# Patient Record
Sex: Male | Born: 1960
Health system: Southern US, Community
[De-identification: ages and names within clinical notes are randomized; demographics above are authoritative.]

## PROBLEM LIST (undated history)

## (undated) DIAGNOSIS — C801 Malignant (primary) neoplasm, unspecified: Secondary | ICD-10-CM

## (undated) DIAGNOSIS — M549 Dorsalgia, unspecified: Secondary | ICD-10-CM

## (undated) HISTORY — DX: Dorsalgia, unspecified: M54.9

---

## 2003-09-02 ENCOUNTER — Encounter: Admission: RE | Admit: 2003-09-02 | Discharge: 2003-09-02 | Payer: Self-pay | Admitting: Family Medicine

## 2012-02-19 ENCOUNTER — Ambulatory Visit
Admission: RE | Admit: 2012-02-19 | Discharge: 2012-02-19 | Disposition: A | Discharge status: Home / Self Care | Payer: 59 | Source: Ambulatory Visit | Attending: Family Medicine | Admitting: Family Medicine

## 2012-02-19 ENCOUNTER — Other Ambulatory Visit: Payer: Self-pay | Admitting: Family Medicine

## 2012-02-19 DIAGNOSIS — T1490XA Injury, unspecified, initial encounter: Secondary | ICD-10-CM

## 2014-01-31 ENCOUNTER — Other Ambulatory Visit: Payer: Self-pay | Admitting: Physician Assistant

## 2014-01-31 ENCOUNTER — Ambulatory Visit
Admission: RE | Admit: 2014-01-31 | Discharge: 2014-01-31 | Disposition: A | Discharge status: Home / Self Care | Payer: 59 | Source: Ambulatory Visit | Attending: Physician Assistant | Admitting: Physician Assistant

## 2014-01-31 DIAGNOSIS — R05 Cough: Secondary | ICD-10-CM

## 2014-01-31 DIAGNOSIS — R059 Cough, unspecified: Secondary | ICD-10-CM

## 2014-09-23 ENCOUNTER — Other Ambulatory Visit (INDEPENDENT_AMBULATORY_CARE_PROVIDER_SITE_OTHER): Payer: Self-pay | Admitting: General Surgery

## 2014-09-23 ENCOUNTER — Inpatient Hospital Stay (HOSPITAL_COMMUNITY)
Admission: AD | Admit: 2014-09-23 | Discharge: 2014-09-26 | DRG: 603 | Disposition: A | Discharge status: Home / Self Care | Payer: 59 | Source: Ambulatory Visit | Attending: General Surgery | Admitting: General Surgery

## 2014-09-23 DIAGNOSIS — Z791 Long term (current) use of non-steroidal anti-inflammatories (NSAID): Secondary | ICD-10-CM | POA: Diagnosis not present

## 2014-09-23 DIAGNOSIS — L02416 Cutaneous abscess of left lower limb: Secondary | ICD-10-CM | POA: Diagnosis present

## 2014-09-23 DIAGNOSIS — L03116 Cellulitis of left lower limb: Secondary | ICD-10-CM | POA: Diagnosis present

## 2014-09-23 DIAGNOSIS — Z792 Long term (current) use of antibiotics: Secondary | ICD-10-CM

## 2014-09-23 DIAGNOSIS — Z79899 Other long term (current) drug therapy: Secondary | ICD-10-CM

## 2014-09-23 LAB — CREATININE, SERUM
Creatinine, Ser: 0.96 mg/dL (ref 0.50–1.35)
GFR calc Af Amer: >90 mL/min (ref >90)
GFR calc non Af Amer: >90 mL/min (ref >90)

## 2014-09-23 MED ORDER — PIPERACILLIN-TAZOBACTAM 3.375 G IVPB 30 MIN
3.3750 g | Freq: Once | INTRAVENOUS | Status: AC
  Administered 2014-09-23: 3.375 g via INTRAVENOUS
  Filled 2014-09-23: qty 50

## 2014-09-23 MED ORDER — OXYCODONE HCL 5 MG PO TABS
5.0000 mg | ORAL_TABLET | ORAL | Status: DC | PRN
  Administered 2014-09-23 – 2014-09-24 (×2): 5 mg via ORAL
  Filled 2014-09-23 (×2): qty 1

## 2014-09-23 MED ORDER — KCL IN DEXTROSE-NACL 20-5-0.9 MEQ/L-%-% IV SOLN
INTRAVENOUS | Status: DC
  Administered 2014-09-23: 50 mL/h via INTRAVENOUS
  Administered 2014-09-24: 20:00:00 via INTRAVENOUS
  Administered 2014-09-25: 50 mL/h via INTRAVENOUS
  Filled 2014-09-23 (×4): qty 1000

## 2014-09-23 MED ORDER — VANCOMYCIN HCL IN DEXTROSE 1-5 GM/200ML-% IV SOLN
1000.0000 mg | Freq: Two times a day (BID) | INTRAVENOUS | Status: DC
  Administered 2014-09-24 – 2014-09-25 (×4): 1000 mg via INTRAVENOUS
  Filled 2014-09-23 (×4): qty 200

## 2014-09-23 MED ORDER — VANCOMYCIN HCL 10 G IV SOLR
2500.0000 mg | Freq: Once | INTRAVENOUS | Status: AC
  Administered 2014-09-23: 2500 mg via INTRAVENOUS
  Filled 2014-09-23: qty 2500

## 2014-09-23 MED ORDER — MORPHINE SULFATE 4 MG/ML IJ SOLN
4.0000 mg | INTRAMUSCULAR | Status: DC | PRN
  Administered 2014-09-24 (×3): 4 mg via INTRAVENOUS
  Filled 2014-09-23 (×3): qty 1

## 2014-09-23 MED ORDER — PANTOPRAZOLE SODIUM 40 MG IV SOLR
40.0000 mg | Freq: Every day | INTRAVENOUS | Status: DC
  Administered 2014-09-23 – 2014-09-25 (×3): 40 mg via INTRAVENOUS
  Filled 2014-09-23 (×4): qty 40

## 2014-09-23 MED ORDER — PIPERACILLIN-TAZOBACTAM 3.375 G IVPB
3.3750 g | Freq: Three times a day (TID) | INTRAVENOUS | Status: DC
  Administered 2014-09-24 – 2014-09-26 (×7): 3.375 g via INTRAVENOUS
  Filled 2014-09-23 (×8): qty 50

## 2014-09-23 MED ORDER — ONDANSETRON HCL 4 MG/2ML IJ SOLN: 4.0000 mg | Freq: Four times a day (QID) | INTRAMUSCULAR | Status: DC | PRN

## 2014-09-23 NOTE — Progress Notes (Signed)
ANTIBIOTIC CONSULT NOTE - INITIAL  Pharmacy Consult for Vancomycin and Zosyn Indication: Cellulitis  No Known Allergies  Patient Measurements:   Height: 67 inches Weight: 100 kg  Vital Signs:   Intake/Output from previous day:   Intake/Output from this shift:    Labs: No results for input(s): WBC, HGB, PLT, LABCREA, CREATININE in the last 72 hours. CrCl cannot be calculated (Unknown ideal weight.). No results for input(s): VANCOTROUGH, VANCOPEAK, VANCORANDOM, GENTTROUGH, GENTPEAK, GENTRANDOM, TOBRATROUGH, TOBRAPEAK, TOBRARND, AMIKACINPEAK, AMIKACINTROU, AMIKACIN in the last 72 hours.   Microbiology: No results found for this or any previous visit (from the past 720 hour(s)).  Medical History: No past medical history on file.  Medications:  Scheduled:  . pantoprazole (PROTONIX) IV  40 mg Intravenous QHS  . piperacillin-tazobactam  3.375 g Intravenous Once   Infusions:  . dextrose 5 % and 0.9 % NaCl with KCl 20 mEq/L     PRN: morphine injection, ondansetron, oxyCODONE  Assessment: 54 yo male large abscess in his left posterior mid thigh with a significant area of surrounding cellulitis and induration. The abscess was incised and drained 2/4 and he was on doxycycline. The cellulitis has not improved overnight so he will be admitted for IV antibiotics - vancomycin and zosyn per pharmacy.  PTA Doxy 2/5 >> Vanc >> 2/5 >> Zosyn >>  Tmax:  WBC: check in AM Renal: SCr pending  No cultures ordered  Goal of Therapy:  Vancomycin trough level 10-15 mcg/ml  Zosyn dose per indication, renal function  Plan:   Vancomycin 2500mg  IV x 1 dose, then 1g IV q12h Check trough at steady state Zosyn 3.375gm IV q8h (4hr extended infusions) Follow up renal function & cultures, clinical course   Peggyann Juba, PharmD, BCPS Pager: [phone] 09/23/2014,7:33 PM

## 2014-09-23 NOTE — H&P (Deleted)
  The note originally documented on this encounter has been moved the the encounter in which it belongs.  

## 2014-09-23 NOTE — H&P (Signed)
Anthony [name] is an 54 y.o. male.   Chief Complaint: pain left thigh HPI:  The patient is a 54 year old white male who we saw yesterday in the urgent office clinic.  At that time he had a large abscess in his left posterior mid thigh with a significant area of surrounding cellulitis and induration.  The abscess was incised and drained.  He was on doxycycline.  The cellulitis has not improved overnight so he will be admitted for IV antibiotics  No past medical history on file.  No past surgical history on file.  No family history on file. Social History:  has no tobacco, alcohol, and drug history on file.  Allergies: Allergies not on file   (Not in a hospital admission)  No results found for this or any previous visit (from the past 48 hour(s)). No results found.  Review of Systems  Constitutional: Negative.   HENT: Negative.   Eyes: Negative.   Respiratory: Negative.   Cardiovascular: Negative.   Gastrointestinal: Negative.   Genitourinary: Negative.   Musculoskeletal: Negative.   Skin: Negative.   Neurological: Negative.   Endo/Heme/Allergies: Negative.   Psychiatric/Behavioral: Negative.     There were no vitals taken for this visit. Physical Exam  Constitutional: He is oriented to person, place, and time. He appears well-developed and well-nourished.  HENT:  Head: Normocephalic and atraumatic.  Eyes: Conjunctivae and EOM are normal. Pupils are equal, round, and reactive to light.  Neck: Normal range of motion. Neck supple.  Cardiovascular: Normal rate, regular rhythm and normal heart sounds.   Respiratory: Effort normal and breath sounds normal.  GI: Soft. Bowel sounds are normal.  Musculoskeletal:  There is a abscess on the left posterior mid thigh with significant area of surrounding cellulitis and induration  Neurological: He is alert and oriented to person, place, and time.  Skin: Skin is warm and dry.  Psychiatric: He has a normal mood and affect. His  behavior is normal.     Assessment/Plan   The patient has a large area of cellulitis and abscess on the left posterior thigh.  The wound is open and well drained.  At this point we will admit him to the hospital for IV antibiotics and dressing changes and monitor him closely  TOTH III,Tericka Devincenzi S 09/23/2014, 5:29 PM

## 2014-09-24 ENCOUNTER — Encounter (HOSPITAL_COMMUNITY): Payer: Self-pay | Admitting: *Deleted

## 2014-09-24 LAB — BASIC METABOLIC PANEL
Anion gap: 5 (ref 5–15)
BUN: 13 mg/dL (ref 6–23)
CHLORIDE: 106 mmol/L (ref 96–112)
CO2: 27 mmol/L (ref 19–32)
CREATININE: 0.91 mg/dL (ref 0.50–1.35)
Calcium: 8.6 mg/dL (ref 8.4–10.5)
GLUCOSE: 102 mg/dL — AB (ref 70–99)
Potassium: 4.1 mmol/L (ref 3.5–5.1)
Sodium: 138 mmol/L (ref 135–145)

## 2014-09-24 LAB — CBC
HCT: 39.9 % (ref 39.0–52.0)
Hemoglobin: 13 g/dL (ref 13.0–17.0)
MCH: 28.1 pg (ref 26.0–34.0)
MCHC: 32.6 g/dL (ref 30.0–36.0)
MCV: 86.2 fL (ref 78.0–100.0)
PLATELETS: 222 10*3/uL (ref 150–400)
RBC: 4.63 MIL/uL (ref 4.22–5.81)
RDW: 13.4 % (ref 11.5–15.5)
WBC: 5.1 10*3/uL (ref 4.0–10.5)

## 2014-09-24 NOTE — Progress Notes (Signed)
  Subjective: No complaints  Objective: Vital signs in last 24 hours: Temp:  [97.6 F (36.4 C)-98.6 F (37 C)] 98.1 F (36.7 C) (02/06 0542) Pulse Rate:  [69-84] 70 (02/06 0542) Resp:  [16-20] 16 (02/06 0542) BP: (124-138)/(70-84) 129/78 mmHg (02/06 0542) SpO2:  [98 %-99 %] 98 % (02/06 0542) Weight:  [222 lb 14.4 oz (101.107 kg)] 222 lb 14.4 oz (101.107 kg) (02/05 1934) Last BM Date: 09/23/14  Intake/Output from previous day: 02/05 0701 - 02/06 0700 In: 457.5 [I.V.:407.5; IV Piggyback:50] Out: 300 [Urine:300] Intake/Output this shift:    Resp: clear to auscultation bilaterally Cardio: regular rate and rhythm GI: soft, non-tender; bowel sounds normal; no masses,  no organomegaly Extremities: cellulitis improving. thigh already feels softer  Lab Results:   Recent Labs  09/24/14 0536  WBC 5.1  HGB 13.0  HCT 39.9  PLT 222   BMET  Recent Labs  09/23/14 1858 09/24/14 0536  NA  --  138  K  --  4.1  CL  --  106  CO2  --  27  GLUCOSE  --  102*  BUN  --  13  CREATININE 0.96 0.91  CALCIUM  --  8.6   PT/INR No results for input(s): LABPROT, INR in the last 72 hours. ABG No results for input(s): PHART, HCO3 in the last 72 hours.  Invalid input(s): PCO2, PO2  Studies/Results: No results found.  Anti-infectives: Anti-infectives    Start     Dose/Rate Route Frequency Ordered Stop   09/24/14 1000  vancomycin (VANCOCIN) IVPB 1000 mg/200 mL premix     1,000 mg200 mL/hr over 60 Minutes Intravenous Every 12 hours 09/23/14 2036     09/24/14 0400  piperacillin-tazobactam (ZOSYN) IVPB 3.375 g     3.375 g12.5 mL/hr over 240 Minutes Intravenous Every 8 hours 09/23/14 2036     09/23/14 2030  vancomycin (VANCOCIN) 2,500 mg in sodium chloride 0.9 % 500 mL IVPB     2,500 mg250 mL/hr over 120 Minutes Intravenous  Once 09/23/14 1936 09/23/14 2210   09/23/14 2000  piperacillin-tazobactam (ZOSYN) IVPB 3.375 g     3.375 g100 mL/hr over 30 Minutes Intravenous  Once 09/23/14  1912 09/23/14 2040      Assessment/Plan: s/p * No surgery found * Continue IV vanc and zosyn  Continue dressing changes ambulate  LOS: 1 day    TOTH III,Seba Madole S 09/24/2014

## 2014-09-25 LAB — VANCOMYCIN, TROUGH: VANCOMYCIN TR: 9.6 ug/mL — AB (ref 10.0–20.0)

## 2014-09-25 NOTE — Progress Notes (Signed)
ANTIBIOTIC CONSULT NOTE - Follow up  Pharmacy Consult for Vancomycin and Zosyn Indication: Cellulitis, abscess  No Known Allergies  Patient Measurements: Height: 5\' 7"  (170.2 cm) Weight: 222 lb 14.4 oz (101.107 kg) IBW/kg (Calculated) : 66.1 Height: 67 inches Weight: 100 kg  Vital Signs: Temp: 98 F (36.7 C) (02/07 0610) Temp Source: Oral (02/07 0610) BP: 116/78 mmHg (02/07 0610) Pulse Rate: 70 (02/07 0610) Intake/Output from previous day: 02/06 0701 - 02/07 0700 In: 3532.5 [P.O.:2120; I.V.:912.5; IV Piggyback:500] Out: -  Intake/Output from this shift: Total I/O In: 240 [P.O.:240] Out: -   Labs:  Recent Labs  09/23/14 1858 09/24/14 0536  WBC  --  5.1  HGB  --  13.0  PLT  --  222  CREATININE 0.96 0.91   Estimated Creatinine Clearance: 105.1 mL/min (by C-G formula based on Cr of 0.91). No results for input(s): VANCOTROUGH, VANCOPEAK, VANCORANDOM, GENTTROUGH, GENTPEAK, GENTRANDOM, TOBRATROUGH, TOBRAPEAK, TOBRARND, AMIKACINPEAK, AMIKACINTROU, AMIKACIN in the last 72 hours.   Microbiology: No results found for this or any previous visit (from the past 720 hour(s)).  Medical History: History reviewed. No pertinent past medical history.  Medications:  Scheduled:  . pantoprazole (PROTONIX) IV  40 mg Intravenous QHS  . piperacillin-tazobactam (ZOSYN)  IV  3.375 g Intravenous Q8H  . vancomycin  1,000 mg Intravenous Q12H   Infusions:  . dextrose 5 % and 0.9 % NaCl with KCl 20 mEq/L 50 mL/hr at 09/24/14 2008   PRN: morphine injection, ondansetron, oxyCODONE  Assessment: 54 yo male large abscess in his left posterior mid thigh with a significant area of surrounding cellulitis and induration. The abscess was incised and drained 2/4 and he was on doxycycline. The cellulitis has not improved overnight so he will be admitted for IV antibiotics - vancomycin and zosyn per pharmacy.  PTA Doxy 2/5 >> Vanc >> 2/5 >> Zosyn >>  Tmax: AF WBC: wnl Renal: SCr wnl, CrCl  105CG, 94N  No cultures ordered  Goal of Therapy:  Vancomycin trough level 10-15 mcg/ml  Appropriate antibiotic dosing for renal function; eradication of infection  Plan:  Cont Vanc 1g IV q12h.  Cont Zosyn 3.375g IV Q8H infused over 4hrs. Measure Vanc trough tonight at 2130. Follow up renal fxn and culture results.  Romeo Rabon, PharmD, pager [phone]. 09/25/2014,12:02 PM.

## 2014-09-25 NOTE — Progress Notes (Signed)
  Subjective: No complaints. Dressing changes are getting easier  Objective: Vital signs in last 24 hours: Temp:  [98 F (36.7 C)-98.6 F (37 C)] 98 F (36.7 C) (02/07 0610) Pulse Rate:  [70-79] 70 (02/07 0610) Resp:  [18] 18 (02/07 0610) BP: (116-160)/(71-95) 116/78 mmHg (02/07 0610) SpO2:  [98 %-100 %] 98 % (02/07 0610) Last BM Date: 09/24/14  Intake/Output from previous day: 02/06 0701 - 02/07 0700 In: 3532.5 [P.O.:2120; I.V.:912.5; IV Piggyback:500] Out: -  Intake/Output this shift:    Resp: clear to auscultation bilaterally Cardio: regular rate and rhythm GI: soft, non-tender; bowel sounds normal; no masses,  no organomegaly Extremities: wound still dark but no purulent drainage. cellulitis receding but still very red around wound itself  Lab Results:   Recent Labs  09/24/14 0536  WBC 5.1  HGB 13.0  HCT 39.9  PLT 222   BMET  Recent Labs  09/23/14 1858 09/24/14 0536  NA  --  138  K  --  4.1  CL  --  106  CO2  --  27  GLUCOSE  --  102*  BUN  --  13  CREATININE 0.96 0.91  CALCIUM  --  8.6   PT/INR No results for input(s): LABPROT, INR in the last 72 hours. ABG No results for input(s): PHART, HCO3 in the last 72 hours.  Invalid input(s): PCO2, PO2  Studies/Results: No results found.  Anti-infectives: Anti-infectives    Start     Dose/Rate Route Frequency Ordered Stop   09/24/14 1000  vancomycin (VANCOCIN) IVPB 1000 mg/200 mL premix     1,000 mg200 mL/hr over 60 Minutes Intravenous Every 12 hours 09/23/14 2036     09/24/14 0400  piperacillin-tazobactam (ZOSYN) IVPB 3.375 g     3.375 g12.5 mL/hr over 240 Minutes Intravenous Every 8 hours 09/23/14 2036     09/23/14 2030  vancomycin (VANCOCIN) 2,500 mg in sodium chloride 0.9 % 500 mL IVPB     2,500 mg250 mL/hr over 120 Minutes Intravenous  Once 09/23/14 1936 09/23/14 2210   09/23/14 2000  piperacillin-tazobactam (ZOSYN) IVPB 3.375 g     3.375 g100 mL/hr over 30 Minutes Intravenous  Once 09/23/14  1912 09/23/14 2040      Assessment/Plan: s/p * No surgery found * Advance diet  Continue abx and dressing changes ambulate  LOS: 2 days    TOTH III,PAUL S 09/25/2014

## 2014-09-26 LAB — BASIC METABOLIC PANEL
Anion gap: 6 (ref 5–15)
BUN: 11 mg/dL (ref 6–23)
CALCIUM: 9.2 mg/dL (ref 8.4–10.5)
CO2: 26 mmol/L (ref 19–32)
Chloride: 105 mmol/L (ref 96–112)
Creatinine, Ser: 1.16 mg/dL (ref 0.50–1.35)
GFR calc Af Amer: 81 mL/min — ABNORMAL LOW (ref >90)
GFR calc non Af Amer: 70 mL/min — ABNORMAL LOW (ref >90)
Glucose, Bld: 126 mg/dL — ABNORMAL HIGH (ref 70–99)
Potassium: 4.1 mmol/L (ref 3.5–5.1)
Sodium: 137 mmol/L (ref 135–145)

## 2014-09-26 LAB — CBC
HCT: 41.3 % (ref 39.0–52.0)
Hemoglobin: 13.8 g/dL (ref 13.0–17.0)
MCH: 28.8 pg (ref 26.0–34.0)
MCHC: 33.4 g/dL (ref 30.0–36.0)
MCV: 86 fL (ref 78.0–100.0)
Platelets: 246 10*3/uL (ref 150–400)
RBC: 4.8 MIL/uL (ref 4.22–5.81)
RDW: 13.3 % (ref 11.5–15.5)
WBC: 4.5 10*3/uL (ref 4.0–10.5)

## 2014-09-26 MED ORDER — DOXYCYCLINE HYCLATE 100 MG PO TABS
100.0000 mg | ORAL_TABLET | Freq: Two times a day (BID) | ORAL | Status: DC
  Administered 2014-09-26: 100 mg via ORAL
  Filled 2014-09-26 (×2): qty 1

## 2014-09-26 MED ORDER — VANCOMYCIN HCL 10 G IV SOLR
1250.0000 mg | Freq: Two times a day (BID) | INTRAVENOUS | Status: DC
  Administered 2014-09-26: 1250 mg via INTRAVENOUS
  Filled 2014-09-26 (×2): qty 1250

## 2014-09-26 MED ORDER — DOXYCYCLINE HYCLATE 100 MG PO TABS: 100.0000 mg | ORAL_TABLET | Freq: Two times a day (BID) | ORAL | Status: DC

## 2014-09-26 MED ORDER — OXYCODONE HCL 5 MG PO TABS: 5.0000 mg | ORAL_TABLET | ORAL | Status: DC | PRN

## 2014-09-26 NOTE — Progress Notes (Signed)
  Subjective: He feels better and the site looks better.  Objective: Vital signs in last 24 hours: Temp:  [97.9 F (36.6 C)-98.7 F (37.1 C)] 97.9 F (36.6 C) (02/08 0600) Pulse Rate:  [67-74] 67 (02/08 0600) Resp:  [16] 16 (02/08 0600) BP: (109-148)/(61-90) 109/61 mmHg (02/08 0600) SpO2:  [99 %-100 %] 100 % (02/08 0600) Last BM Date: 09/24/14 Afebrile, VSS No labs He has a culture listed at the office, but no results Intake/Output from previous day: 02/07 0701 - 02/08 0700 In: 2779.2 [P.O.:360; I.V.:1569.2; IV Piggyback:850] Out: -  Intake/Output this shift:    General appearance: alert, cooperative and no distress Skin: Skin color, texture, turgor normal. No rashes or lesions or site looks much better, cellulitis is improving.  Open site still has some serous drainage, but nothing purulent.   Lab Results:   Recent Labs  09/24/14 0536  WBC 5.1  HGB 13.0  HCT 39.9  PLT 222    BMET  Recent Labs  09/23/14 1858 09/24/14 0536  NA  --  138  K  --  4.1  CL  --  106  CO2  --  27  GLUCOSE  --  102*  BUN  --  13  CREATININE 0.96 0.91  CALCIUM  --  8.6   PT/INR No results for input(s): LABPROT, INR in the last 72 hours.  No results for input(s): AST, ALT, ALKPHOS, BILITOT, PROT, ALBUMIN in the last 168 hours.   Lipase  No results found for: LIPASE   Studies/Results: No results found.  Medications: . pantoprazole (PROTONIX) IV  40 mg Intravenous QHS  . piperacillin-tazobactam (ZOSYN)  IV  3.375 g Intravenous Q8H  . vancomycin  1,250 mg Intravenous Q12H   . dextrose 5 % and 0.9 % NaCl with KCl 20 mEq/L 50 mL/hr (09/25/14 1806)   Prior to Admission medications   Medication Sig Start Date End Date Taking? Authorizing Provider  aluminum chloride (DRYSOL) 20 % external solution Apply 1 application topically at bedtime.   Yes Historical Provider, MD  doxycycline (VIBRAMYCIN) 100 MG capsule Take 100 mg by mouth 2 (two) times daily. Started 2.3.16 for 14 days    Yes Historical Provider, MD  ibuprofen (ADVIL,MOTRIN) 200 MG tablet Take 200 mg by mouth every 6 (six) hours as needed for moderate pain.   Yes Historical Provider, MD  naftifine (NAFTIN) 1 % cream Apply 1 application topically daily.   Yes Historical Provider, MD  triamcinolone (KENALOG) 0.025 % cream Apply 1 application topically 2 (two) times daily.   Yes Historical Provider, MD     Assessment/Plan Left thigh abscess with cellulitis S/p I&D in the office 09/23/14.  Plan:  I am going to switch him to Doxycycline, shower, clip hair around his lower thigh and most likely send home later today. I will recheck labs before he goes also.      LOS: 3 days    Anthony [name] 09/26/2014

## 2014-09-26 NOTE — Progress Notes (Signed)
ANTIBIOTIC CONSULT NOTE - FOLLOW UP  Pharmacy Consult for vancomycin Indication: Cellulitis, abscess  No Known Allergies  Patient Measurements: Height: 5\' 7"  (170.2 cm) Weight: 222 lb 14.4 oz (101.107 kg) IBW/kg (Calculated) : 66.1 Adjusted Body Weight:   Vital Signs: Temp: 98.7 F (37.1 C) (02/07 2200) Temp Source: Oral (02/07 2200) BP: 148/90 mmHg (02/07 2200) Pulse Rate: 74 (02/07 2200) Intake/Output from previous day: 02/07 0701 - 02/08 0700 In: 2279.2 [P.O.:360; I.V.:1369.2; IV Piggyback:550] Out: -  Intake/Output from this shift: Total I/O In: 714.2 [I.V.:414.2; IV Piggyback:300] Out: -   Labs:  Recent Labs  09/23/14 1858 09/24/14 0536  WBC  --  5.1  HGB  --  13.0  PLT  --  222  CREATININE 0.96 0.91   Estimated Creatinine Clearance: 105.1 mL/min (by C-G formula based on Cr of 0.91).  Recent Labs  09/25/14 2127  [place] 9.6*     Microbiology: No results found for this or any previous visit (from the past 720 hour(s)).  Anti-infectives    Start     Dose/Rate Route Frequency Ordered Stop   09/26/14 0600  vancomycin (VANCOCIN) 1,250 mg in sodium chloride 0.9 % 250 mL IVPB     1,250 mg166.7 mL/hr over 90 Minutes Intravenous Every 12 hours 09/26/14 0315     09/24/14 1000  vancomycin (VANCOCIN) IVPB 1000 mg/200 mL premix  Status:  Discontinued     1,000 mg200 mL/hr over 60 Minutes Intravenous Every 12 hours 09/23/14 2036 09/26/14 0315   09/24/14 0400  piperacillin-tazobactam (ZOSYN) IVPB 3.375 g     3.375 g12.5 mL/hr over 240 Minutes Intravenous Every 8 hours 09/23/14 2036     09/23/14 2030  vancomycin (VANCOCIN) 2,500 mg in sodium chloride 0.9 % 500 mL IVPB     2,500 mg250 mL/hr over 120 Minutes Intravenous  Once 09/23/14 1936 09/23/14 2210   09/23/14 2000  piperacillin-tazobactam (ZOSYN) IVPB 3.375 g     3.375 g100 mL/hr over 30 Minutes Intravenous  Once 09/23/14 1912 09/23/14 2040      Assessment: Patient with cellulitis, abscess.  Prior doses  charted correctly.  VT below goal.  Goal of Therapy:  Vancomycin trough level 10-15 mcg/ml  Plan:  Measure antibiotic drug levels at steady state Follow up culture results Change vancomycin 1250mg  iv q12hr, next dose 0600  Tyler Deis, Bria Sparr Crowford 09/26/2014,3:17 AM

## 2014-09-26 NOTE — Progress Notes (Signed)
Patient is alert and oriented, vital signs are stable, patient's wife educated on dressing changes and is comfortable with doing the dressing changes, patient pain is controlled adequately, discharge instructions reviewed with patient and questions answered, patient to follow up with [facility] Neta Mends RN 09-26-2014 15:01pm

## 2014-09-26 NOTE — Discharge Summary (Signed)
Physician Discharge Summary  Patient ID: ATZIN BUCHTA MRN: [id number] DOB/AGE: 12-03-1960 54 y.o.  Admit date: 09/23/2014 Discharge date: 09/26/2014  Admission Diagnoses: * Left thigh abscess with cellulitis S/p I&D in the office 09/23/14.  Discharge Diagnoses:  same Active Problems:   Abscess of left thigh   PROCEDURES: I&D left thigh abscess on admit in our office.  Hospital Course: The patient is a 54 year old white male who we saw yesterday in the urgent office clinic. At that time he had a large abscess in his left posterior mid thigh with a significant area of surrounding cellulitis and induration. The abscess was incised and drained. He was on doxycycline. The cellulitis has not improved overnight so he will be admitted for IV antibiotics He was admitted on 09/23/14 and started on antibiotics including vancomycin and Zosyn.  He has done well and we converted him over to PO antibiotics this AM.  He is doing well labs show a normal WBC, with normal renal function.  We plan 10 more days of doxycycline and follow up in our office with Dr. Marlou Starks next week, they are working on a time at our office.  He will continue wet to dry dressings BID, I put him in the shower today and he knows how to do the dressing.  Condition on d/c:  Improving  Disposition: Final discharge disposition not confirmed     Medication List    STOP taking these medications        doxycycline 100 MG capsule  Commonly known as:  VIBRAMYCIN  Replaced by:  doxycycline 100 MG tablet      TAKE these medications        aluminum chloride 20 % external solution  Commonly known as:  DRYSOL  Apply 1 application topically at bedtime.     doxycycline 100 MG tablet  Commonly known as:  VIBRA-TABS  Take 1 tablet (100 mg total) by mouth every 12 (twelve) hours.     ibuprofen 200 MG tablet  Commonly known as:  ADVIL,MOTRIN  Take 200 mg by mouth every 6 (six) hours as needed for moderate pain.     naftifine 1 %  cream  Commonly known as:  NAFTIN  Apply 1 application topically daily.     oxyCODONE 5 MG immediate release tablet  Commonly known as:  Oxy IR/ROXICODONE  Take 1-2 tablets (5-10 mg total) by mouth every 4 (four) hours as needed for moderate pain or severe pain.     triamcinolone 0.025 % cream  Commonly known as:  KENALOG  Apply 1 application topically 2 (two) times daily.           Follow-up Information    Follow up with Merrie Roof, MD.   Specialty:  General Surgery   Why:  Call tomorrow or Wed for an appointment next week.  They are working on fitting you in.   Contact information:   [place] [address] [phone]       Follow up with REDMON,NOELLE, PA-C.   Specialty:  Nurse Practitioner   Why:  Call and let them know what happened so they can follow up with you.   Contact information:   [address], [place] [address] [phone]       Signed: Earnstine Regal 09/26/2014, 2:20 PM

## 2014-09-26 NOTE — Discharge Instructions (Signed)
Percutaneous Abscess Drain An abscess is a collection of infected fluid inside the body. Your health care provider may decide to remove or drain the infected fluid from the area by placing a thin needle into the abscess. Usually, a small tube is left in place to drain the abscess fluid. The abscess fluid may take a few days to drain. LET [facility] CARE PROVIDER KNOW ABOUT:  Any allergies you have.  All medicines you are taking, including vitamins, herbs, eye drops, creams, and over-the-counter medicines. This includes steroid medicines by mouth or cream.  Previous problems you or members of your family have had with the use of anesthetics.  Any blood disorders you have.  Previous surgeries you have had.  Possibility of pregnancy, if this applies.  Medical conditions you have.  Any history of smoking. RISKS AND COMPLICATIONS Generally, this is a safe procedure. However, problems can occur and include:   Infection.  Allergic reaction to materials used (such as contrast dye).  Damage to a nearby organ or tissue.  Bleeding.  Blockage of a tube placed to drain the abscess, requiring placement of a new drainage tube.  A need to repeat the procedure.  Failure of the procedure to adequately drain the abscess, requiring an open surgical procedure to do so. BEFORE THE PROCEDURE   Ask your health care provider about:  Changing or stopping your regular medicines. This is especially important if you are taking diabetes medicines or blood thinners.  Taking medicines such as aspirin and ibuprofen. These medicines can thin your blood. Do not take these medicines before your procedure if your health care provider asks you not to.  Your health care provider may do some blood or urine tests. These will help your health care provider learn how well your kidneys and liver are working and how well your blood clots.  Do not eat or drink anything after midnight on the night before the  procedure or as directed by your health care provider.  Make arrangements for someone to drive you home after the procedure.  PROCEDURE   An IV tube will be placed in your arm. Medicine will be able to flow directly into your body through this tube.  You will lie on an X-ray table.  Your heart rate, blood pressure, and breathing will be monitored.   Your oxygen level will also be watched during the procedure. Supplemental oxygen may be given if necessary.  The skin around the area where the drainage tube (catheter) will be placed will be cleaned and numbed.  A small cut (incision) will then be made to insert the drainage tube. The drainage tube will be inserted using X-ray or CT scan to help direct where it should be placed.  The drainage tube will be guided into the abscess to drain the infected fluid.  The drainage tube may stay in place and be connected to a bag outside your body. It will stay until the fluid has stopped draining and the infection is gone. AFTER THE PROCEDURE  You will be taken to a recovery area where you will stay until the medicines have worn off.  You will stay in bed for several hours.  Your progress will be monitored.   Your blood pressure and pulse will be checked often.   The area of the incision will be checked often.  You may have some pain or feel sick. Tell your health care provider.  As you begin to feel better, you may be given ice,  fluids, and food.   When you can walk, drink, eat, and use the bathroom, you may be able to go home. Document Released: 12/20/2013 Document Reviewed: 09/24/2013 [facility] Patient Information 2015 [place]. This information is not intended to replace advice given to you by your health care provider. Make sure you discuss any questions you have with your health care provider. Dressing Change A dressing is a material placed over wounds. It keeps the wound clean, dry, and protected from further injury.  You can shower before each dressing change and get soap and water into the site. BEFORE YOU BEGIN  Get your supplies together. Things you may need include:  Salt solution (saline).  Flexible gauze bandage.  Tape.  Gloves.  Ace bandage  Gauze squares.  Plastic bags.  Take pain medicine 30 minutes before the bandage change if you need it.  Take a shower before you do the first bandage change of the day. Put plastic wrap or a bag over the dressing. REMOVING YOUR OLD BANDAGE  Wash your hands with soap and water. Dry your hands with a clean towel.  Put on your gloves.  Remove any tape.  Remove the old bandage as told. If it sticks, put a small amount of warm water on it to loosen the bandage.  Remove any gauze or packing tape in your wound.  Take off your gloves.  Put the gloves, tape, gauze, or any packing tape in a plastic bag. CHANGING YOUR BANDAGE  Open the supplies.  Take the cap off the salt solution.  Open the gauze. Leave the gauze on the inside of the package.  Put on your gloves.  Clean your wound as told by your doctor.  Keep your wound dry if your doctor told you to do so.  Your doctor may tell you to do one or more of the following:  Pick up the gauze. Pour the salt solution over the gauze. Squeeze out the extra salt solution.  Put medicated cream or other medicine on your wound.  Put solution soaked gauze only in your wound, not on the skin around it.  Pack your wound loosely.  Put dry gauze on your wound.  Put belly pads over the dry gauze if your bandages soak through.  Tape the bandage in place so it will not fall off. Do not wrap the tape all the way around your arm or leg.  Wrap the bandage with the flexible gauze bandage as told by your doctor.  Take off your gloves. Put them in the plastic bag with the old bandage. Tie the bag shut and throw it away.  Keep the bandage clean and dry.  Wash your hands. GET HELP RIGHT AWAY IF:    Your skin around the wound looks red.  Your wound feels more tender or sore.  You see yellowish-white fluid (pus) in the wound.  Your wound smells bad.  You have a fever.  Your skin around the wound has a red rash that itches and burns.  You see black or yellow skin in your wound that was not there before.  You feel sick to your stomach (nauseous), throw up (vomit), and feel very tired. Document Released: 11/01/2008 Document Revised: 12/20/2013 Document Reviewed: 06/16/2011 [facility] Patient Information 2015 [place], [place]. This information is not intended to replace advice given to you by your health care provider. Make sure you discuss any questions you have with your health care provider.

## 2016-04-19 DIAGNOSIS — D225 Melanocytic nevi of trunk: Secondary | ICD-10-CM | POA: Diagnosis not present

## 2016-04-19 DIAGNOSIS — I788 Other diseases of capillaries: Secondary | ICD-10-CM | POA: Diagnosis not present

## 2016-04-19 DIAGNOSIS — B353 Tinea pedis: Secondary | ICD-10-CM | POA: Diagnosis not present

## 2016-04-19 DIAGNOSIS — D1801 Hemangioma of skin and subcutaneous tissue: Secondary | ICD-10-CM | POA: Diagnosis not present

## 2016-06-27 DIAGNOSIS — R972 Elevated prostate specific antigen [PSA]: Secondary | ICD-10-CM | POA: Diagnosis not present

## 2016-07-03 DIAGNOSIS — R972 Elevated prostate specific antigen [PSA]: Secondary | ICD-10-CM | POA: Diagnosis not present

## 2016-07-27 DIAGNOSIS — Z23 Encounter for immunization: Secondary | ICD-10-CM | POA: Diagnosis not present

## 2016-09-10 DIAGNOSIS — F509 Eating disorder, unspecified: Secondary | ICD-10-CM | POA: Diagnosis not present

## 2016-11-26 DIAGNOSIS — M5442 Lumbago with sciatica, left side: Secondary | ICD-10-CM | POA: Diagnosis not present

## 2016-11-26 DIAGNOSIS — M9903 Segmental and somatic dysfunction of lumbar region: Secondary | ICD-10-CM | POA: Diagnosis not present

## 2016-11-27 DIAGNOSIS — M5442 Lumbago with sciatica, left side: Secondary | ICD-10-CM | POA: Diagnosis not present

## 2016-11-27 DIAGNOSIS — M9903 Segmental and somatic dysfunction of lumbar region: Secondary | ICD-10-CM | POA: Diagnosis not present

## 2016-11-28 DIAGNOSIS — M5442 Lumbago with sciatica, left side: Secondary | ICD-10-CM | POA: Diagnosis not present

## 2016-11-28 DIAGNOSIS — M9903 Segmental and somatic dysfunction of lumbar region: Secondary | ICD-10-CM | POA: Diagnosis not present

## 2016-12-11 DIAGNOSIS — M9903 Segmental and somatic dysfunction of lumbar region: Secondary | ICD-10-CM | POA: Diagnosis not present

## 2016-12-11 DIAGNOSIS — M5442 Lumbago with sciatica, left side: Secondary | ICD-10-CM | POA: Diagnosis not present

## 2016-12-18 DIAGNOSIS — M5442 Lumbago with sciatica, left side: Secondary | ICD-10-CM | POA: Diagnosis not present

## 2016-12-18 DIAGNOSIS — M9903 Segmental and somatic dysfunction of lumbar region: Secondary | ICD-10-CM | POA: Diagnosis not present

## 2016-12-25 DIAGNOSIS — M5442 Lumbago with sciatica, left side: Secondary | ICD-10-CM | POA: Diagnosis not present

## 2016-12-25 DIAGNOSIS — M9903 Segmental and somatic dysfunction of lumbar region: Secondary | ICD-10-CM | POA: Diagnosis not present

## 2017-01-02 DIAGNOSIS — M5442 Lumbago with sciatica, left side: Secondary | ICD-10-CM | POA: Diagnosis not present

## 2017-01-02 DIAGNOSIS — M9903 Segmental and somatic dysfunction of lumbar region: Secondary | ICD-10-CM | POA: Diagnosis not present

## 2017-01-07 DIAGNOSIS — M5442 Lumbago with sciatica, left side: Secondary | ICD-10-CM | POA: Diagnosis not present

## 2017-01-07 DIAGNOSIS — M9903 Segmental and somatic dysfunction of lumbar region: Secondary | ICD-10-CM | POA: Diagnosis not present

## 2017-01-15 DIAGNOSIS — M9903 Segmental and somatic dysfunction of lumbar region: Secondary | ICD-10-CM | POA: Diagnosis not present

## 2017-01-15 DIAGNOSIS — M5442 Lumbago with sciatica, left side: Secondary | ICD-10-CM | POA: Diagnosis not present

## 2017-01-16 DIAGNOSIS — M5442 Lumbago with sciatica, left side: Secondary | ICD-10-CM | POA: Diagnosis not present

## 2017-01-16 DIAGNOSIS — M9903 Segmental and somatic dysfunction of lumbar region: Secondary | ICD-10-CM | POA: Diagnosis not present

## 2017-01-27 DIAGNOSIS — M5442 Lumbago with sciatica, left side: Secondary | ICD-10-CM | POA: Diagnosis not present

## 2017-01-27 DIAGNOSIS — Z0000 Encounter for general adult medical examination without abnormal findings: Secondary | ICD-10-CM | POA: Diagnosis not present

## 2017-01-27 DIAGNOSIS — Z131 Encounter for screening for diabetes mellitus: Secondary | ICD-10-CM | POA: Diagnosis not present

## 2017-01-27 DIAGNOSIS — Z13220 Encounter for screening for lipoid disorders: Secondary | ICD-10-CM | POA: Diagnosis not present

## 2017-01-27 DIAGNOSIS — M9903 Segmental and somatic dysfunction of lumbar region: Secondary | ICD-10-CM | POA: Diagnosis not present

## 2017-01-28 ENCOUNTER — Encounter: Payer: Self-pay | Admitting: Endocrinology

## 2017-01-30 DIAGNOSIS — H524 Presbyopia: Secondary | ICD-10-CM | POA: Diagnosis not present

## 2017-01-30 DIAGNOSIS — H5213 Myopia, bilateral: Secondary | ICD-10-CM | POA: Diagnosis not present

## 2017-01-30 DIAGNOSIS — H52223 Regular astigmatism, bilateral: Secondary | ICD-10-CM | POA: Diagnosis not present

## 2017-02-03 DIAGNOSIS — M5442 Lumbago with sciatica, left side: Secondary | ICD-10-CM | POA: Diagnosis not present

## 2017-02-03 DIAGNOSIS — M9903 Segmental and somatic dysfunction of lumbar region: Secondary | ICD-10-CM | POA: Diagnosis not present

## 2017-02-11 DIAGNOSIS — M5442 Lumbago with sciatica, left side: Secondary | ICD-10-CM | POA: Diagnosis not present

## 2017-02-11 DIAGNOSIS — M9903 Segmental and somatic dysfunction of lumbar region: Secondary | ICD-10-CM | POA: Diagnosis not present

## 2017-03-25 DIAGNOSIS — Z23 Encounter for immunization: Secondary | ICD-10-CM | POA: Diagnosis not present

## 2017-03-26 ENCOUNTER — Encounter: Payer: Self-pay | Admitting: Endocrinology

## 2017-03-26 DIAGNOSIS — M8088XA Other osteoporosis with current pathological fracture, vertebra(e), initial encounter for fracture: Secondary | ICD-10-CM | POA: Diagnosis not present

## 2017-03-26 DIAGNOSIS — M8588 Other specified disorders of bone density and structure, other site: Secondary | ICD-10-CM | POA: Diagnosis not present

## 2017-04-10 DIAGNOSIS — Z86010 Personal history of colonic polyps: Secondary | ICD-10-CM | POA: Diagnosis not present

## 2017-04-10 DIAGNOSIS — D126 Benign neoplasm of colon, unspecified: Secondary | ICD-10-CM | POA: Diagnosis not present

## 2017-04-10 DIAGNOSIS — K640 First degree hemorrhoids: Secondary | ICD-10-CM | POA: Diagnosis not present

## 2017-04-10 DIAGNOSIS — K5730 Diverticulosis of large intestine without perforation or abscess without bleeding: Secondary | ICD-10-CM | POA: Diagnosis not present

## 2017-04-10 DIAGNOSIS — K635 Polyp of colon: Secondary | ICD-10-CM | POA: Diagnosis not present

## 2017-04-11 ENCOUNTER — Telehealth: Payer: Self-pay | Admitting: General Practice

## 2017-04-11 NOTE — Telephone Encounter (Signed)
Wife calling to check on referral sent on8/16. Please call to advise whether or not it was received.  Ty,  -LL

## 2017-04-11 NOTE — Telephone Encounter (Signed)
We called pt on the 16th and had to leave a message.  Called pt again and LM again

## 2017-04-14 DIAGNOSIS — D126 Benign neoplasm of colon, unspecified: Secondary | ICD-10-CM | POA: Diagnosis not present

## 2017-04-14 DIAGNOSIS — K635 Polyp of colon: Secondary | ICD-10-CM | POA: Diagnosis not present

## 2017-04-17 ENCOUNTER — Ambulatory Visit (INDEPENDENT_AMBULATORY_CARE_PROVIDER_SITE_OTHER): Payer: BLUE CROSS/BLUE SHIELD | Admitting: Endocrinology

## 2017-04-17 ENCOUNTER — Encounter: Payer: Self-pay | Admitting: Endocrinology

## 2017-04-17 DIAGNOSIS — M810 Age-related osteoporosis without current pathological fracture: Secondary | ICD-10-CM

## 2017-04-17 DIAGNOSIS — R972 Elevated prostate specific antigen [PSA]: Secondary | ICD-10-CM | POA: Diagnosis not present

## 2017-04-17 NOTE — Patient Instructions (Addendum)
It is critically important to prevent falling down (keep floor areas well-lit, dry, and free of loose objects.  If you have a cane, walker, or wheelchair, you should use it, even for short trips around the house.  Wear flat-soled shoes.  Also, try not to rush).   blood tests are requested for you today.  We'll let you know about the results. Based on the results, I would probably prescribe for you a medication for this.       Osteoporosis Osteoporosis is the thinning and loss of density in the bones. Osteoporosis makes the bones more brittle, fragile, and likely to break (fracture). Over time, osteoporosis can cause the bones to become so weak that they fracture after a simple fall. The bones most likely to fracture are the bones in the hip, wrist, and spine. What are the causes? The exact cause is not known. What increases the risk? Anyone can develop osteoporosis. You may be at greater risk if you have a family history of the condition or have poor nutrition. You may also have a higher risk if you are:  Male.  60 years old or older.  A smoker.  Not physically active.  White or Asian.  Slender.  What are the signs or symptoms? A fracture might be the first sign of the disease, especially if it results from a fall or injury that would not usually cause a bone to break. Other signs and symptoms include:  Low back and neck pain.  Stooped posture.  Height loss.  How is this diagnosed? To make a diagnosis, your health care provider may:  Take a medical history.  Perform a physical exam.  Order tests, such as: ? A bone mineral density test. ? A dual-energy X-ray absorptiometry test.  How is this treated? The goal of osteoporosis treatment is to strengthen your bones to reduce your risk of a fracture. Treatment may involve:  Making lifestyle changes, such as: ? Eating a diet rich in calcium. ? Doing weight-bearing and muscle-strengthening exercises. ? Stopping tobacco  use. ? Limiting alcohol intake.  Taking medicine to slow the process of bone loss or to increase bone density.  Monitoring your levels of calcium and vitamin D.  Follow these instructions at home:  Include calcium and vitamin D in your diet. Calcium is important for bone health, and vitamin D helps the body absorb calcium.  Perform weight-bearing and muscle-strengthening exercises as directed by your health care provider.  Do not use any tobacco products, including cigarettes, chewing tobacco, and electronic cigarettes. If you need help quitting, ask your health care provider.  Limit your alcohol intake.  Take medicines only as directed by your health care provider.  Keep all follow-up visits as directed by your health care provider. This is important.  Take precautions at home to lower your risk of falling, such as: ? Keeping rooms well lit and clutter free. ? Installing safety rails on stairs. ? Using rubber mats in the bathroom and other areas that are often wet or slippery. Get help right away if: You fall or injure yourself. This information is not intended to replace advice given to you by your health care provider. Make sure you discuss any questions you have with your health care provider. Document Released: 05/15/2005 Document Revised: 01/08/2016 Document Reviewed: 01/13/2014 Elsevier Interactive Patient Education  2017 [employer].

## 2017-04-17 NOTE — Progress Notes (Signed)
 Subjective:    Patient ID: Anthony [name], male    DOB: 12/04/1960, 56 y.o.   MRN: [id number]  HPI Pt is referred by Noelle Redmon, PA, for osteoporosis.  Pt was noted to have osteoporosis a in mid-2018, on x-rays.  he has never been on medication for this.  Only other bony fracture was right hand, approx 1999, with injury.  he has no history of any of the following: hypogonadism, multiple myeloma, renal dz, thyroid problems, prolonged bedrest, steroids, liver dz, vid-d deficiency, urolithiasis, primary hyperparathyroidism.  She does not take heparin or anticonvulsants.  He has no h/o serious back injury. He quit smoking in 1993. He drinks alcohol, avg of 2/day. He has many years of moderate pain at the lower back, but no assoc weight change.   Past Medical History:  Diagnosis Date  . Back pain     No past surgical history on file.  Social History   Social History  . Marital status: Married    Spouse name: N/A  . Number of children: N/A  . Years of education: N/A   Occupational History  . Not on file.   Social History Main Topics  . Smoking status: Never Smoker  . Smokeless tobacco: Former User    Types: Chew, Snuff  . Alcohol use 1.2 oz/week    2 Glasses of wine per week  . Drug use: No  . Sexual activity: Yes   Other Topics Concern  . Not on file   Social History Narrative  . No narrative on file    Current Outpatient Prescriptions on File Prior to Visit  Medication Sig Dispense Refill  . aluminum chloride (DRYSOL) 20 % external solution Apply 1 application topically at bedtime.    . ibuprofen (ADVIL,MOTRIN) 200 MG tablet Take 200 mg by mouth every 6 (six) hours as needed for moderate pain.    . naftifine (NAFTIN) 1 % cream Apply 1 application topically daily.    . oxyCODONE (OXY IR/ROXICODONE) 5 MG immediate release tablet Take 1-2 tablets (5-10 mg total) by mouth every 4 (four) hours as needed for moderate pain or severe pain. (Patient not taking: Reported on  04/17/2017) 40 tablet 0  . triamcinolone (KENALOG) 0.025 % cream Apply 1 application topically 2 (two) times daily.     No current facility-administered medications on file prior to visit.     No Known Allergies  Family History  Problem Relation Age of Onset  . Osteoporosis Neg Hx     BP 126/82   Pulse 74   Wt 220 lb 12.8 oz (100.2 kg)   SpO2 97%   BMI 34.58 kg/m    Review of Systems denies hematuria, heartburn, cold intolerance, edema, skin rash, insomnia, falls, cramps, memory loss, easy bruising, and rhinorrhea.     Objective:   Physical Exam VS: see vs page GEN: no distress HEAD: head: no deformity eyes: no periorbital swelling, no proptosis external nose and ears are normal mouth: no lesion seen NECK: supple, thyroid is not enlarged CHEST WALL: no deformity.  No kyphosis. LUNGS: clear to auscultation CV: reg rate and rhythm, no murmur.   ABD: abdomen is soft, nontender.  no hepatosplenomegaly.  not distended.  no hernia MUSCULOSKELETAL: muscle bulk and strength are grossly normal.  no obvious joint swelling.  gait is normal and steady EXTEMITIES: no deformity.  no edema PULSES: no carotid bruit NEURO:  cn 2-12 grossly intact.   readily moves all 4's.  sensation is intact to touch   on all 4's SKIN:  Normal texture and temperature.  No rash or suspicious lesion is visible.   NODES:  None palpable at the neck PSYCH: alert, well-oriented.  Does not appear anxious nor depressed.    I have reviewed outside records, and summarized: Pt was noted to have osteoporosis, and referred here.  He was seen for wellness visit, and for back pain.  X-rays were ordered  Radiol: multiple  Multiple spinal compression fractures  DEXA: worst T-score was -2.0     Assessment & Plan:  Osteoporosis: new.  Based on non-traumatic fractures, he need medication.   alcohol intake: this could contribute to osteoporosis  Patient Instructions  It is critically important to prevent falling  down (keep floor areas well-lit, dry, and free of loose objects.  If you have a cane, walker, or wheelchair, you should use it, even for short trips around the house.  Wear flat-soled shoes.  Also, try not to rush).   blood tests are requested for you today.  We'll let you know about the results. Based on the results, I would probably prescribe for you a medication for this.       Osteoporosis Osteoporosis is the thinning and loss of density in the bones. Osteoporosis makes the bones more brittle, fragile, and likely to break (fracture). Over time, osteoporosis can cause the bones to become so weak that they fracture after a simple fall. The bones most likely to fracture are the bones in the hip, wrist, and spine. What are the causes? The exact cause is not known. What increases the risk? Anyone can develop osteoporosis. You may be at greater risk if you have a family history of the condition or have poor nutrition. You may also have a higher risk if you are:  Male.  50 years old or older.  A smoker.  Not physically active.  White or Asian.  Slender.  What are the signs or symptoms? A fracture might be the first sign of the disease, especially if it results from a fall or injury that would not usually cause a bone to break. Other signs and symptoms include:  Low back and neck pain.  Stooped posture.  Height loss.  How is this diagnosed? To make a diagnosis, your health care provider may:  Take a medical history.  Perform a physical exam.  Order tests, such as: ? A bone mineral density test. ? A dual-energy X-ray absorptiometry test.  How is this treated? The goal of osteoporosis treatment is to strengthen your bones to reduce your risk of a fracture. Treatment may involve:  Making lifestyle changes, such as: ? Eating a diet rich in calcium. ? Doing weight-bearing and muscle-strengthening exercises. ? Stopping tobacco use. ? Limiting alcohol intake.  Taking  medicine to slow the process of bone loss or to increase bone density.  Monitoring your levels of calcium and vitamin D.  Follow these instructions at home:  Include calcium and vitamin D in your diet. Calcium is important for bone health, and vitamin D helps the body absorb calcium.  Perform weight-bearing and muscle-strengthening exercises as directed by your health care provider.  Do not use any tobacco products, including cigarettes, chewing tobacco, and electronic cigarettes. If you need help quitting, ask your health care provider.  Limit your alcohol intake.  Take medicines only as directed by your health care provider.  Keep all follow-up visits as directed by your health care provider. This is important.  Take precautions at home to lower   your risk of falling, such as: ? Keeping rooms well lit and clutter free. ? Installing safety rails on stairs. ? Using rubber mats in the bathroom and other areas that are often wet or slippery. Get help right away if: You fall or injure yourself. This information is not intended to replace advice given to you by your health care provider. Make sure you discuss any questions you have with your health care provider. Document Released: 05/15/2005 Document Revised: 01/08/2016 Document Reviewed: 01/13/2014 Elsevier Interactive Patient Education  2017 [employer]    

## 2017-04-18 DIAGNOSIS — R972 Elevated prostate specific antigen [PSA]: Secondary | ICD-10-CM | POA: Insufficient documentation

## 2017-04-18 LAB — PROLACTIN: PROLACTIN: 5.9 ng/mL (ref 2.0–18.0)

## 2017-04-18 LAB — PTH, INTACT AND CALCIUM
Calcium: 8.5 mg/dL — ABNORMAL LOW (ref 8.6–10.3)
PTH: 49 pg/mL (ref 14–64)

## 2017-04-18 LAB — HEPATIC FUNCTION PANEL
ALBUMIN: 4.7 g/dL (ref 3.5–5.2)
ALK PHOS: 51 U/L (ref 39–117)
ALT: 24 U/L (ref 0–53)
AST: 19 U/L (ref 0–37)
BILIRUBIN DIRECT: 0 mg/dL (ref 0.0–0.3)
BILIRUBIN TOTAL: 0.4 mg/dL (ref 0.2–1.2)
Total Protein: 7.4 g/dL (ref 6.0–8.3)

## 2017-04-18 LAB — VITAMIN D 25 HYDROXY (VIT D DEFICIENCY, FRACTURES): VITD: 24.09 ng/mL — ABNORMAL LOW (ref 30.00–100.00)

## 2017-04-18 LAB — TSH: TSH: 2.32 u[IU]/mL (ref 0.35–4.50)

## 2017-04-18 LAB — CORTISOL: Cortisol, Plasma: 3.6 ug/dL

## 2017-04-18 LAB — PHOSPHORUS: Phosphorus: 3.6 mg/dL (ref 2.3–4.6)

## 2017-04-19 ENCOUNTER — Encounter: Payer: Self-pay | Admitting: Endocrinology

## 2017-04-20 LAB — VITAMIN D 1,25 DIHYDROXY
VITAMIN D 1, 25 (OH) TOTAL: 36 pg/mL (ref 18–72)
Vitamin D2 1, 25 (OH)2: <8 pg/mL
Vitamin D3 1, 25 (OH)2: 36 pg/mL

## 2017-04-20 LAB — TESTOSTERONE,FREE AND TOTAL
Testosterone, Free: 8.3 pg/mL (ref 7.2–24.0)
Testosterone: 286 ng/dL (ref 264–916)

## 2017-04-23 ENCOUNTER — Encounter: Payer: Self-pay | Admitting: Endocrinology

## 2017-04-23 ENCOUNTER — Other Ambulatory Visit: Payer: Self-pay | Admitting: Endocrinology

## 2017-04-23 DIAGNOSIS — E559 Vitamin D deficiency, unspecified: Secondary | ICD-10-CM | POA: Insufficient documentation

## 2017-04-23 DIAGNOSIS — M810 Age-related osteoporosis without current pathological fracture: Secondary | ICD-10-CM

## 2017-04-23 LAB — PROTEIN ELECTROPHORESIS, SERUM
Abnormal Protein Band1: 0.7 g/dL — ABNORMAL HIGH
Albumin ELP: 4.3 g/dL (ref 3.8–4.8)
Alpha-1-Globulin: 0.2 g/dL (ref 0.2–0.3)
Alpha-2-Globulin: 0.5 g/dL (ref 0.5–0.9)
BETA 2: 0.7 g/dL — AB (ref 0.2–0.5)
BETA GLOBULIN: 0.5 g/dL (ref 0.4–0.6)
Gamma Globulin: 0.8 g/dL (ref 0.8–1.7)
Total Protein, Serum Electrophoresis: 7 g/dL (ref 6.1–8.1)

## 2017-04-23 MED ORDER — ERGOCALCIFEROL 1.25 MG (50000 UT) PO CAPS: 50000.0000 [IU] | ORAL_CAPSULE | ORAL | 0 refills | Status: DC

## 2017-04-24 ENCOUNTER — Other Ambulatory Visit: Payer: Self-pay | Admitting: Endocrinology

## 2017-04-24 ENCOUNTER — Telehealth: Payer: Self-pay | Admitting: Endocrinology

## 2017-04-24 DIAGNOSIS — R778 Other specified abnormalities of plasma proteins: Secondary | ICD-10-CM

## 2017-04-24 DIAGNOSIS — R769 Abnormal immunological finding in serum, unspecified: Principal | ICD-10-CM

## 2017-04-24 NOTE — Telephone Encounter (Signed)
please call lab: I have ordered IFE.  Can they do with blood they have?

## 2017-04-25 ENCOUNTER — Other Ambulatory Visit: Payer: Self-pay

## 2017-04-25 DIAGNOSIS — R769 Abnormal immunological finding in serum, unspecified: Principal | ICD-10-CM

## 2017-04-25 DIAGNOSIS — R778 Other specified abnormalities of plasma proteins: Secondary | ICD-10-CM

## 2017-04-25 NOTE — Telephone Encounter (Signed)
Unable to add lab. I called patient & left VM to see if he could come in today for IFE.

## 2017-05-16 ENCOUNTER — Other Ambulatory Visit: Payer: Self-pay

## 2017-05-16 MED ORDER — ERGOCALCIFEROL 1.25 MG (50000 UT) PO CAPS: 50000.0000 [IU] | ORAL_CAPSULE | ORAL | 0 refills | Status: AC

## 2017-05-29 ENCOUNTER — Encounter: Payer: Self-pay | Admitting: Endocrinology

## 2017-06-02 ENCOUNTER — Other Ambulatory Visit (INDEPENDENT_AMBULATORY_CARE_PROVIDER_SITE_OTHER): Payer: BLUE CROSS/BLUE SHIELD

## 2017-06-02 DIAGNOSIS — Z23 Encounter for immunization: Secondary | ICD-10-CM | POA: Diagnosis not present

## 2017-06-02 DIAGNOSIS — E559 Vitamin D deficiency, unspecified: Secondary | ICD-10-CM | POA: Diagnosis not present

## 2017-06-02 DIAGNOSIS — R778 Other specified abnormalities of plasma proteins: Secondary | ICD-10-CM

## 2017-06-02 DIAGNOSIS — R769 Abnormal immunological finding in serum, unspecified: Secondary | ICD-10-CM

## 2017-06-02 LAB — BASIC METABOLIC PANEL
BUN: 15 mg/dL (ref 6–23)
CALCIUM: 10 mg/dL (ref 8.4–10.5)
CO2: 29 meq/L (ref 19–32)
Chloride: 101 mEq/L (ref 96–112)
Creatinine, Ser: 0.98 mg/dL (ref 0.40–1.50)
GFR: 83.74 mL/min (ref >60.00)
GLUCOSE: 98 mg/dL (ref 70–99)
Potassium: 4.6 mEq/L (ref 3.5–5.1)
Sodium: 136 mEq/L (ref 135–145)

## 2017-06-02 LAB — VITAMIN D 25 HYDROXY (VIT D DEFICIENCY, FRACTURES): VITD: 55.45 ng/mL (ref 30.00–100.00)

## 2017-06-03 ENCOUNTER — Other Ambulatory Visit: Payer: Self-pay | Admitting: Endocrinology

## 2017-06-03 MED ORDER — IBANDRONATE SODIUM 150 MG PO TABS: 150.0000 mg | ORAL_TABLET | ORAL | 3 refills | Status: DC

## 2017-06-04 ENCOUNTER — Other Ambulatory Visit: Payer: Self-pay | Admitting: Endocrinology

## 2017-06-04 DIAGNOSIS — R769 Abnormal immunological finding in serum, unspecified: Principal | ICD-10-CM

## 2017-06-04 DIAGNOSIS — R778 Other specified abnormalities of plasma proteins: Secondary | ICD-10-CM

## 2017-06-04 LAB — IMMUNOFIXATION ELECTROPHORESIS
IGG (IMMUNOGLOBIN G), SERUM: 782 mg/dL (ref 694–1618)
IGM, SERUM: 68 mg/dL (ref 48–271)
IMMUNOGLOBULIN A: 653 mg/dL — AB (ref 81–463)
Immunofix Electr Int: DETECTED

## 2017-06-10 ENCOUNTER — Encounter: Payer: Self-pay | Admitting: Endocrinology

## 2017-06-23 NOTE — Telephone Encounter (Signed)
Patient wife is calling on the status of referral that was sent to oncology, no one has called to schedule the appointment.  Please call patient wife to let them know where the referral is going to be. Please advise

## 2017-06-24 ENCOUNTER — Encounter: Payer: Self-pay | Admitting: Endocrinology

## 2017-06-25 DIAGNOSIS — R972 Elevated prostate specific antigen [PSA]: Secondary | ICD-10-CM | POA: Diagnosis not present

## 2017-07-02 ENCOUNTER — Ambulatory Visit (HOSPITAL_BASED_OUTPATIENT_CLINIC_OR_DEPARTMENT_OTHER): Payer: BLUE CROSS/BLUE SHIELD | Admitting: Hematology and Oncology

## 2017-07-02 ENCOUNTER — Telehealth: Payer: Self-pay | Admitting: Hematology and Oncology

## 2017-07-02 ENCOUNTER — Encounter: Payer: Self-pay | Admitting: Hematology and Oncology

## 2017-07-02 VITALS — BP 128/86 | HR 64 | Temp 98.6°F | Resp 18 | Ht 67.0 in | Wt 202.9 lb

## 2017-07-02 DIAGNOSIS — R972 Elevated prostate specific antigen [PSA]: Secondary | ICD-10-CM | POA: Diagnosis not present

## 2017-07-02 DIAGNOSIS — R778 Other specified abnormalities of plasma proteins: Secondary | ICD-10-CM

## 2017-07-02 DIAGNOSIS — D472 Monoclonal gammopathy: Secondary | ICD-10-CM

## 2017-07-02 DIAGNOSIS — R769 Abnormal immunological finding in serum, unspecified: Secondary | ICD-10-CM | POA: Diagnosis not present

## 2017-07-02 NOTE — Telephone Encounter (Signed)
Scheduled appt per 11/14 los - patient is aware of appt date and time - my chart active.

## 2017-07-03 ENCOUNTER — Other Ambulatory Visit (HOSPITAL_BASED_OUTPATIENT_CLINIC_OR_DEPARTMENT_OTHER): Payer: BLUE CROSS/BLUE SHIELD

## 2017-07-03 DIAGNOSIS — R769 Abnormal immunological finding in serum, unspecified: Secondary | ICD-10-CM | POA: Diagnosis not present

## 2017-07-03 DIAGNOSIS — D472 Monoclonal gammopathy: Secondary | ICD-10-CM | POA: Diagnosis not present

## 2017-07-03 LAB — CBC WITH DIFFERENTIAL/PLATELET
BASO%: 0.6 % (ref 0.0–2.0)
BASOS ABS: 0 10*3/uL (ref 0.0–0.1)
EOS ABS: 0.2 10*3/uL (ref 0.0–0.5)
EOS%: 5.2 % (ref 0.0–7.0)
HEMATOCRIT: 44.9 % (ref 38.4–49.9)
HGB: 14.8 g/dL (ref 13.0–17.1)
LYMPH%: 27.4 % (ref 14.0–49.0)
MCH: 28.4 pg (ref 27.2–33.4)
MCHC: 33 g/dL (ref 32.0–36.0)
MCV: 86.2 fL (ref 79.3–98.0)
MONO#: 0.3 10*3/uL (ref 0.1–0.9)
MONO%: 9.1 % (ref 0.0–14.0)
NEUT#: 1.7 10*3/uL (ref 1.5–6.5)
NEUT%: 57.7 % (ref 39.0–75.0)
PLATELETS: 159 10*3/uL (ref 140–400)
RBC: 5.21 10*6/uL (ref 4.20–5.82)
RDW: 14.5 % (ref 11.0–14.6)
WBC: 3 10*3/uL — ABNORMAL LOW (ref 4.0–10.3)
lymph#: 0.8 10*3/uL — ABNORMAL LOW (ref 0.9–3.3)

## 2017-07-03 LAB — COMPREHENSIVE METABOLIC PANEL
ALT: 22 U/L (ref 0–55)
AST: 20 U/L (ref 5–34)
Albumin: 4.3 g/dL (ref 3.5–5.0)
Alkaline Phosphatase: 55 U/L (ref 40–150)
Anion Gap: 10 mEq/L (ref 3–11)
BUN: 16.3 mg/dL (ref 7.0–26.0)
CALCIUM: 9.6 mg/dL (ref 8.4–10.4)
CHLORIDE: 106 meq/L (ref 98–109)
CO2: 25 meq/L (ref 22–29)
Creatinine: 1.2 mg/dL (ref 0.7–1.3)
Glucose: 95 mg/dl (ref 70–140)
Potassium: 4.1 mEq/L (ref 3.5–5.1)
Sodium: 141 mEq/L (ref 136–145)
Total Bilirubin: 0.35 mg/dL (ref 0.20–1.20)
Total Protein: 7.4 g/dL (ref 6.4–8.3)

## 2017-07-03 LAB — LACTATE DEHYDROGENASE: LDH: 147 U/L (ref 125–245)

## 2017-07-04 DIAGNOSIS — L918 Other hypertrophic disorders of the skin: Secondary | ICD-10-CM | POA: Diagnosis not present

## 2017-07-04 DIAGNOSIS — B070 Plantar wart: Secondary | ICD-10-CM | POA: Diagnosis not present

## 2017-07-04 DIAGNOSIS — L821 Other seborrheic keratosis: Secondary | ICD-10-CM | POA: Diagnosis not present

## 2017-07-04 LAB — KAPPA/LAMBDA LIGHT CHAINS
Ig Kappa Free Light Chain: 45.3 mg/L — ABNORMAL HIGH (ref 3.3–19.4)
Ig Lambda Free Light Chain: 10.8 mg/L (ref 5.7–26.3)
Kappa/Lambda FluidC Ratio: 4.19 — ABNORMAL HIGH (ref 0.26–1.65)

## 2017-07-04 LAB — BETA 2 MICROGLOBULIN, SERUM: Beta-2: 1.8 mg/L (ref 0.6–2.4)

## 2017-07-07 LAB — MULTIPLE MYELOMA PANEL, SERUM
ALPHA 1: 0.2 g/dL (ref 0.0–0.4)
ALPHA2 GLOB SERPL ELPH-MCNC: 0.5 g/dL (ref 0.4–1.0)
Albumin SerPl Elph-Mcnc: 4.1 g/dL (ref 2.9–4.4)
Albumin/Glob SerPl: 1.6 (ref 0.7–1.7)
B-Globulin SerPl Elph-Mcnc: 1.2 g/dL (ref 0.7–1.3)
Gamma Glob SerPl Elph-Mcnc: 0.7 g/dL (ref 0.4–1.8)
Globulin, Total: 2.6 g/dL (ref 2.2–3.9)
IGA/IMMUNOGLOBULIN A, SERUM: 565 mg/dL — AB (ref 90–386)
IGG (IMMUNOGLOBIN G), SERUM: 722 mg/dL (ref 700–1600)
IGM (IMMUNOGLOBIN M), SRM: 60 mg/dL (ref 20–172)
M Protein SerPl Elph-Mcnc: 0.2 g/dL — ABNORMAL HIGH
TOTAL PROTEIN: 6.7 g/dL (ref 6.0–8.5)

## 2017-07-09 ENCOUNTER — Encounter (INDEPENDENT_AMBULATORY_CARE_PROVIDER_SITE_OTHER): Payer: Self-pay

## 2017-07-09 ENCOUNTER — Telehealth: Payer: Self-pay | Admitting: Hematology and Oncology

## 2017-07-09 ENCOUNTER — Ambulatory Visit (HOSPITAL_BASED_OUTPATIENT_CLINIC_OR_DEPARTMENT_OTHER): Payer: BLUE CROSS/BLUE SHIELD | Admitting: Hematology and Oncology

## 2017-07-09 VITALS — BP 139/86 | HR 68 | Temp 99.1°F | Resp 18 | Ht 67.0 in | Wt 205.7 lb

## 2017-07-09 DIAGNOSIS — D472 Monoclonal gammopathy: Secondary | ICD-10-CM

## 2017-07-09 NOTE — Telephone Encounter (Signed)
Gave avs and calendar for December  °

## 2017-07-11 ENCOUNTER — Telehealth: Payer: Self-pay

## 2017-07-11 NOTE — Telephone Encounter (Signed)
Patient had requested lab work to be made visible on MyChart. Patient confirmed today that he was able to she all of his labs that were drawn at his visit this week.

## 2017-07-17 ENCOUNTER — Encounter: Payer: Self-pay | Admitting: Hematology and Oncology

## 2017-07-17 DIAGNOSIS — D472 Monoclonal gammopathy: Secondary | ICD-10-CM | POA: Insufficient documentation

## 2017-07-17 DIAGNOSIS — C9000 Multiple myeloma not having achieved remission: Secondary | ICD-10-CM | POA: Insufficient documentation

## 2017-07-17 NOTE — Assessment & Plan Note (Signed)
56 y.o. male with nonspecific abnormality in the serum protein electrophoresis, potentially suggestive of monoclonal gammopathy of unknown significance.  The lab work in question was obtained in August of this year and a retesting would be the primary step in assessment.  Until abnormal findings are confirmed, I will refrain from speculating as to etiology in mean of the phone to laboratory abnormalities.  Plan: --Labs tomorrow as outlined below --RTC 1 week to review

## 2017-07-17 NOTE — Progress Notes (Signed)
[facility] New Visit:  Assessment: Abnormal serum protein test 56 y.o. male with nonspecific abnormality in the serum protein electrophoresis, potentially suggestive of monoclonal gammopathy of unknown significance.  The lab work in question was obtained in August of this year and a retesting would be the primary step in assessment.  Until abnormal findings are confirmed, I will refrain from speculating as to etiology in mean of the phone to laboratory abnormalities.  Plan: --Labs tomorrow as outlined below --RTC 1 week to review   Voice recognition software was used and creation of this note. Despite my best effort at editing the text, some misspelling/errors may have occurred.  Orders Placed This Encounter  Procedures  . CBC with Differential    Standing Status:   Future    Number of Occurrences:   1    Standing Expiration Date:   07/02/2018  . Comprehensive metabolic panel    Standing Status:   Future    Number of Occurrences:   1    Standing Expiration Date:   07/02/2018  . Lactate dehydrogenase (LDH)    Standing Status:   Future    Number of Occurrences:   1    Standing Expiration Date:   07/02/2018  . Multiple Myeloma Panel (SPEP&IFE w/QIG)    Standing Status:   Future    Number of Occurrences:   1    Standing Expiration Date:   07/02/2018  . Kappa/lambda light chains    Standing Status:   Future    Number of Occurrences:   1    Standing Expiration Date:   07/02/2018  . Beta 2 microglobulin    Standing Status:   Future    Number of Occurrences:   1    Standing Expiration Date:   07/02/2018    All questions were answered.  . The patient knows to call the clinic with any problems, questions or concerns.  This note was electronically signed.    History of Presenting Illness Anthony [name] 56 y.o. presenting to the [facility] for evaluation of abnormal serum protein referred by Dr. Renato Shin.  Please see hematological data available  below.  At the present time, active complaints of the patient include chronic back pain for the past 20 years.  Patient does have no radiation of the pain in the lower extremities.  He denies any changes in sensation or strength in the distal extremities.  Patient has diagnosis of osteoporosis and underwent a bone scan which demonstrated 3 stress fractures in the vertebral bodies.  With that, additional evaluation was obtained demonstrating elevated IgA as outlined below.  Patient denies any symptoms from the respiratory, cardiovascular, genitourinary, or neurological systems.  No recent change in appetite or activity tolerance, no fevers, chills, night sweats.  Oncological/hematological History: --Labs, 04/17/17: SPEP --abnormal protein band in the beta globulins and possible second abnormal band in the gamma globulins; IgG 782, IgA 653, IgM 68;  Medical History: Past Medical History:  Diagnosis Date  . Back pain     Surgical History: No past surgical history on file.  Family History: Family History  Problem Relation Age of Onset  . Osteoporosis Neg Hx     Social History: Social History   Socioeconomic History  . Marital status: Married    Spouse name: Not on file  . Number of children: Not on file  . Years of education: Not on file  . Highest education level: Not on file  Social Needs  .  Financial resource strain: Not on file  . Food insecurity - worry: Not on file  . Food insecurity - inability: Not on file  . Transportation needs - medical: Not on file  . Transportation needs - non-medical: Not on file  Occupational History  . Not on file  Tobacco Use  . Smoking status: Never Smoker  . Smokeless tobacco: Former Systems developer    Types: Chew, Snuff  Substance and Sexual Activity  . Alcohol use: Yes    Alcohol/week: 1.2 oz    Types: 2 Glasses of wine per week  . Drug use: No  . Sexual activity: Yes  Other Topics Concern  . Not on file  Social History Narrative  . Not on  file    Allergies: No Known Allergies  Medications:  Current Outpatient Medications  Medication Sig Dispense Refill  . aluminum chloride (DRYSOL) 20 % external solution Apply 1 application topically at bedtime.    Marland Kitchen aspirin EC 81 MG tablet Take 81 mg by mouth daily.    . diclofenac sodium (VOLTAREN) 1 % GEL Apply 2 g topically as needed.    . ibandronate (BONIVA) 150 MG tablet Take 1 tablet (150 mg total) by mouth every 30 (thirty) days. Take in the morning with a full glass of water, on an empty stomach, and do not take anything else by mouth or lie down for the next 30 min. 3 tablet 3  . ibuprofen (ADVIL,MOTRIN) 200 MG tablet Take 200 mg by mouth every 6 (six) hours as needed for moderate pain.    . naftifine (NAFTIN) 1 % cream Apply 1 application topically daily.    . Omega-3 Fatty Acids (FISH OIL) 1360 MG CAPS Take 1 capsule by mouth daily.    Marland Kitchen oxyCODONE (OXY IR/ROXICODONE) 5 MG immediate release tablet Take 1-2 tablets (5-10 mg total) by mouth every 4 (four) hours as needed for moderate pain or severe pain. (Patient not taking: Reported on 04/17/2017) 40 tablet 0  . terbinafine (LAMISIL) 1 % cream Apply 1 application topically 2 (two) times daily.    Marland Kitchen triamcinolone (KENALOG) 0.025 % cream Apply 1 application topically 2 (two) times daily.     No current facility-administered medications for this visit.     Review of Systems: Review of Systems  Musculoskeletal: Positive for back pain.  All other systems reviewed and are negative.    PHYSICAL EXAMINATION Blood pressure 128/86, pulse 64, temperature 98.6 F (37 C), temperature source Oral, resp. rate 18, height _0  (1.702 m), weight 202 lb 14.4 oz (92 kg), SpO2 100 %.  ECOG PERFORMANCE STATUS: 1 - Symptomatic but completely ambulatory  Physical Exam  Constitutional: He is oriented to person, place, and time and well-developed, well-nourished, and in no distress. No distress.  HENT:  Head: Normocephalic and atraumatic.   Mouth/Throat: Oropharynx is clear and moist. No oropharyngeal exudate.  Eyes: Conjunctivae and EOM are normal. Pupils are equal, round, and reactive to light. No scleral icterus.  Neck: No thyromegaly present.  Cardiovascular: Normal rate, regular rhythm and normal heart sounds.  No murmur heard. Pulmonary/Chest: Effort normal and breath sounds normal. No respiratory distress. He has no wheezes. He has no rales.  Abdominal: Soft. Bowel sounds are normal. He exhibits no distension. There is no tenderness. There is no rebound and no guarding.  Musculoskeletal: He exhibits no edema or deformity.  Lymphadenopathy:    He has no cervical adenopathy.  Neurological: He is alert and oriented to person, place, and time. He has  normal reflexes. No cranial nerve deficit.  Skin: Skin is warm and dry. No rash noted. He is not diaphoretic. No erythema. No pallor.     LABORATORY DATA: I have personally reviewed the data as listed: No visits with results within 1 Week(s) from this visit.  Latest known visit with results is:  Lab on 06/02/2017  Component Date Value Ref Range Status  . Sodium 06/02/2017 136  135 - 145 mEq/L Final  . Potassium 06/02/2017 4.6  3.5 - 5.1 mEq/L Final  . Chloride 06/02/2017 101  96 - 112 mEq/L Final  . CO2 06/02/2017 29  19 - 32 mEq/L Final  . Glucose, Bld 06/02/2017 98  70 - 99 mg/dL Final  . BUN 06/02/2017 15  6 - 23 mg/dL Final  . Creatinine, Ser 06/02/2017 0.98  0.40 - 1.50 mg/dL Final  . Calcium 06/02/2017 10.0  8.4 - 10.5 mg/dL Final  . GFR 06/02/2017 83.74  >60.00 mL/min Final  . VITD 06/02/2017 55.45  30.00 - 100.00 ng/mL Final  . Immunofix Electr Int 06/02/2017 IGA KAPPA MONOCLONAL PROTEIN DETECTED   Final   Comment: . If Bence-Jones proteinuria (free light chain) is a clinical concern, immunofixation analysis on 24 hour urine collection is suggested. .   . Immunoglobulin A 06/02/2017 653* 81 - 463 mg/dL Final  . IgG (Immunoglobin G), Serum 06/02/2017 782   694 - 1,618 mg/dL Final  . IgM, Serum 06/02/2017 68  48 - 271 mg/dL Final         Ardath Sax, MD

## 2017-07-20 ENCOUNTER — Other Ambulatory Visit: Payer: Self-pay | Admitting: Radiology

## 2017-07-21 ENCOUNTER — Other Ambulatory Visit: Payer: Self-pay | Admitting: Radiology

## 2017-07-22 ENCOUNTER — Ambulatory Visit (HOSPITAL_COMMUNITY)
Admission: RE | Admit: 2017-07-22 | Discharge: 2017-07-22 | Disposition: A | Discharge status: Home / Self Care | Payer: BLUE CROSS/BLUE SHIELD | Source: Ambulatory Visit | Attending: Hematology and Oncology | Admitting: Hematology and Oncology

## 2017-07-22 DIAGNOSIS — N320 Bladder-neck obstruction: Secondary | ICD-10-CM | POA: Insufficient documentation

## 2017-07-22 DIAGNOSIS — D472 Monoclonal gammopathy: Secondary | ICD-10-CM | POA: Insufficient documentation

## 2017-07-22 DIAGNOSIS — Z01812 Encounter for preprocedural laboratory examination: Secondary | ICD-10-CM | POA: Insufficient documentation

## 2017-07-22 DIAGNOSIS — N400 Enlarged prostate without lower urinary tract symptoms: Secondary | ICD-10-CM | POA: Diagnosis not present

## 2017-07-22 LAB — GLUCOSE, CAPILLARY: Glucose-Capillary: 94 mg/dL (ref 65–99)

## 2017-07-22 MED ORDER — FLUDEOXYGLUCOSE F - 18 (FDG) INJECTION
10.3000 | Freq: Once | INTRAVENOUS | Status: AC | PRN
  Administered 2017-07-22: 10.3 via INTRAVENOUS

## 2017-07-23 ENCOUNTER — Ambulatory Visit (HOSPITAL_COMMUNITY)
Admission: RE | Admit: 2017-07-23 | Discharge: 2017-07-23 | Disposition: A | Discharge status: Home / Self Care | Payer: BLUE CROSS/BLUE SHIELD | Source: Ambulatory Visit | Attending: Hematology and Oncology | Admitting: Hematology and Oncology

## 2017-07-23 ENCOUNTER — Encounter (HOSPITAL_COMMUNITY): Payer: Self-pay

## 2017-07-23 DIAGNOSIS — M549 Dorsalgia, unspecified: Secondary | ICD-10-CM | POA: Diagnosis not present

## 2017-07-23 DIAGNOSIS — C9000 Multiple myeloma not having achieved remission: Secondary | ICD-10-CM | POA: Diagnosis not present

## 2017-07-23 DIAGNOSIS — D72822 Plasmacytosis: Secondary | ICD-10-CM | POA: Diagnosis not present

## 2017-07-23 DIAGNOSIS — Z7982 Long term (current) use of aspirin: Secondary | ICD-10-CM | POA: Diagnosis not present

## 2017-07-23 DIAGNOSIS — Z79899 Other long term (current) drug therapy: Secondary | ICD-10-CM | POA: Diagnosis not present

## 2017-07-23 DIAGNOSIS — Z7983 Long term (current) use of bisphosphonates: Secondary | ICD-10-CM | POA: Insufficient documentation

## 2017-07-23 DIAGNOSIS — Z87891 Personal history of nicotine dependence: Secondary | ICD-10-CM | POA: Diagnosis not present

## 2017-07-23 DIAGNOSIS — D472 Monoclonal gammopathy: Secondary | ICD-10-CM | POA: Insufficient documentation

## 2017-07-23 LAB — CBC WITH DIFFERENTIAL/PLATELET
BASOS ABS: 0 10*3/uL (ref 0.0–0.1)
Basophils Relative: 0 %
EOS ABS: 0.2 10*3/uL (ref 0.0–0.7)
Eosinophils Relative: 6 %
HCT: 44.4 % (ref 39.0–52.0)
HEMOGLOBIN: 14.9 g/dL (ref 13.0–17.0)
LYMPHS ABS: 1.3 10*3/uL (ref 0.7–4.0)
LYMPHS PCT: 30 %
MCH: 28.7 pg (ref 26.0–34.0)
MCHC: 33.6 g/dL (ref 30.0–36.0)
MCV: 85.4 fL (ref 78.0–100.0)
Monocytes Absolute: 0.5 10*3/uL (ref 0.1–1.0)
Monocytes Relative: 12 %
NEUTROS PCT: 52 %
Neutro Abs: 2.3 10*3/uL (ref 1.7–7.7)
PLATELETS: 203 10*3/uL (ref 150–400)
RBC: 5.2 MIL/uL (ref 4.22–5.81)
RDW: 13.9 % (ref 11.5–15.5)
WBC: 4.3 10*3/uL (ref 4.0–10.5)

## 2017-07-23 LAB — PROTIME-INR
INR: 0.95
PROTHROMBIN TIME: 12.6 s (ref 11.4–15.2)

## 2017-07-23 MED ORDER — FENTANYL CITRATE (PF) 100 MCG/2ML IJ SOLN
INTRAMUSCULAR | Status: AC | PRN
  Administered 2017-07-23 (×2): 50 ug via INTRAVENOUS

## 2017-07-23 MED ORDER — SODIUM CHLORIDE 0.9 % IV SOLN
INTRAVENOUS | Status: DC
  Administered 2017-07-23: 07:00:00 via INTRAVENOUS

## 2017-07-23 MED ORDER — LIDOCAINE HCL (PF) 1 % IJ SOLN
INTRAMUSCULAR | Status: AC | PRN
  Administered 2017-07-23: 20 mL

## 2017-07-23 MED ORDER — MIDAZOLAM HCL 2 MG/2ML IJ SOLN
INTRAMUSCULAR | Status: AC | PRN
  Administered 2017-07-23 (×2): 1 mg via INTRAVENOUS

## 2017-07-23 MED ORDER — MIDAZOLAM HCL 2 MG/2ML IJ SOLN
INTRAMUSCULAR | Status: AC
  Filled 2017-07-23: qty 2

## 2017-07-23 MED ORDER — FENTANYL CITRATE (PF) 100 MCG/2ML IJ SOLN
INTRAMUSCULAR | Status: AC
  Filled 2017-07-23: qty 2

## 2017-07-23 NOTE — Discharge Instructions (Signed)
Bone Marrow Aspiration and Bone Marrow Biopsy, Adult, Care After  Keep your dressing on for 24 hours. You make take a shower after this time.   This sheet gives you information about how to care for yourself after your procedure. Your health care provider may also give you more specific instructions. If you have problems or questions, contact your health care provider. What can I expect after the procedure? After the procedure, it is common to have:  Mild pain and tenderness.  Swelling.  Bruising.  Follow these instructions at home:  Take over-the-counter or prescription medicines only as told by your health care provider.  Do not take baths, swim, or use a hot tub until your health care provider approves. Ask if you can take a shower or have a sponge bath.  Follow instructions from your health care provider about how to take care of the puncture site. Make sure you: ? Wash your hands with soap and water before you change your bandage (dressing). If soap and water are not available, use hand sanitizer. ? Change your dressing as told by your health care provider.  Check your puncture siteevery day for signs of infection. Check for: ? More redness, swelling, or pain. ? More fluid or blood. ? Warmth. ? Pus or a bad smell.  Return to your normal activities as told by your health care provider. Ask your health care provider what activities are safe for you.  Do not drive for 24 hours if you were given a medicine to help you relax (sedative).  Keep all follow-up visits as told by your health care provider. This is important. Contact a health care provider if:  You have more redness, swelling, or pain around the puncture site.  You have more fluid or blood coming from the puncture site.  Your puncture site feels warm to the touch.  You have pus or a bad smell coming from the puncture site.  You have a fever.  Your pain is not controlled with medicine. This information is not  intended to replace advice given to you by your health care provider. Make sure you discuss any questions you have with your health care provider. Document Released: 02/22/2005 Document Revised: 02/23/2016 Document Reviewed: 01/17/2016 Elsevier Interactive Patient Education  2018 [place].   Moderate Conscious Sedation, Adult, Care After These instructions provide you with information about caring for yourself after your procedure. Your health care provider may also give you more specific instructions. Your treatment has been planned according to current medical practices, but problems sometimes occur. Call your health care provider if you have any problems or questions after your procedure. What can I expect after the procedure? After your procedure, it is common:  To feel sleepy for several hours.  To feel clumsy and have poor balance for several hours.  To have poor judgment for several hours.  To vomit if you eat too soon.  Follow these instructions at home: For at least 24 hours after the procedure:   Do not: ? Participate in activities where you could fall or become injured. ? Drive. ? Use heavy machinery. ? Drink alcohol. ? Take sleeping pills or medicines that cause drowsiness. ? Make important decisions or sign legal documents. ? Take care of children on your own.  Rest. Eating and drinking  Follow the diet recommended by your health care provider.  If you vomit: ? Drink water, juice, or soup when you can drink without vomiting. ? Make sure you have little  or no nausea before eating solid foods. General instructions  Have a responsible adult stay with you until you are awake and alert.  Take over-the-counter and prescription medicines only as told by your health care provider.  If you smoke, do not smoke without supervision.  Keep all follow-up visits as told by your health care provider. This is important. Contact a health care provider if:  You keep  feeling nauseous or you keep vomiting.  You feel light-headed.  You develop a rash.  You have a fever. Get help right away if:  You have trouble breathing. This information is not intended to replace advice given to you by your health care provider. Make sure you discuss any questions you have with your health care provider. Document Released: 05/26/2013 Document Revised: 01/08/2016 Document Reviewed: 11/25/2015 Elsevier Interactive Patient Education  [employer].

## 2017-07-23 NOTE — Consult Note (Signed)
 Chief Complaint: Patient was seen in consultation today for CT guided bone marrow biopsy  Referring Physician(s): Perlov,Mikhail G  Supervising Physician: Hoss, Arthur  Patient Status: [facility] - Out-pt  History of Present Illness: Anthony [name] is a 56 y.o. male with history of MGUS/abnormal SPEP who presents today for CT guided bone marrow biopsy to r/o myeloma.  Past Medical History:  Diagnosis Date  . Back pain     History reviewed. No pertinent surgical history.  Allergies: Patient has no known allergies.  Medications: Prior to Admission medications   Medication Sig Start Date End Date Taking? Authorizing Provider  aluminum chloride (DRYSOL) 20 % external solution Apply 1 application topically at bedtime.   Yes [provider]  calcium-vitamin D (OSCAL WITH D) 250-125 MG-UNIT tablet Take 1 tablet by mouth daily.   Yes [provider]  diclofenac sodium (VOLTAREN) 1 % GEL Apply 2 g topically as needed.   Yes [provider]  Multiple Vitamin (MULTIVITAMIN) tablet Take 1 tablet by mouth daily.   Yes [provider]  Omega-3 Fatty Acids (FISH OIL) 1360 MG CAPS Take 1 capsule by mouth daily.   Yes [provider]  aspirin EC 81 MG tablet Take 81 mg by mouth daily.    [provider]  ibandronate (BONIVA) 150 MG tablet Take 1 tablet (150 mg total) by mouth every 30 (thirty) days. Take in the morning with a full glass of water, on an empty stomach, and do not take anything else by mouth or lie down for the next 30 min. 06/03/17   Ellison, Sean, MD  ibuprofen (ADVIL,MOTRIN) 200 MG tablet Take 200 mg by mouth every 6 (six) hours as needed for moderate pain.    [provider]  naftifine (NAFTIN) 1 % cream Apply 1 application topically daily.    [provider]  oxyCODONE (OXY IR/ROXICODONE) 5 MG immediate release tablet Take 1-2 tablets (5-10 mg total) by mouth every 4 (four) hours as needed for moderate pain  or severe pain. Patient not taking: Reported on 04/17/2017 09/26/14   Jennings, Willard, PA-C  terbinafine (LAMISIL) 1 % cream Apply 1 application topically 2 (two) times daily.    [provider]  triamcinolone (KENALOG) 0.025 % cream Apply 1 application topically 2 (two) times daily.    [provider]     Family History  Problem Relation Age of Onset  . Osteoporosis Neg Hx     Social History   Socioeconomic History  . Marital status: Married    Spouse name: None  . Number of children: None  . Years of education: None  . Highest education level: None  Social Needs  . Financial resource strain: None  . Food insecurity - worry: None  . Food insecurity - inability: None  . Transportation needs - medical: None  . Transportation needs - non-medical: None  Occupational History  . None  Tobacco Use  . Smoking status: Never Smoker  . Smokeless tobacco: Former User    Types: Chew, Snuff  Substance and Sexual Activity  . Alcohol use: Yes    Alcohol/week: 1.2 oz    Types: 2 Glasses of wine per week  . Drug use: No  . Sexual activity: Yes  Other Topics Concern  . None  Social History Narrative  . None      Review of Systems denies fever, headache, chest pain, dyspnea, cough, abdominal pain, nausea, vomiting or bleeding.  He does have intermittent back pain.    Vital Signs: BP (!) 135/94   Pulse 70   Temp 98.4 F (36.9 C) (Oral)   Resp 18   Ht 5' 7" (1.702 m)   Wt 205 lb (93 kg)   SpO2 99%   BMI 32.11 kg/m   Physical Exam awake, alert.  Chest clear to auscultation bilaterally.  Heart with regular rate/ rhythm.  Abdomen soft, positive bowel sounds, nontender.  No lower extremity edema. Imaging: Nm Pet Image Initial (pi) Whole Body  Result Date: 07/22/2017 CLINICAL DATA:  Initial treatment strategy for monoclonal gammopathy of unknown significance. Possible multiple myeloma. EXAM: NUCLEAR MEDICINE PET WHOLE BODY TECHNIQUE: 10.3 mCi F-18 FDG was injected  intravenously. Full-ring PET imaging was performed from the vertex to the feet after the radiotracer. CT data was obtained and used for attenuation correction and anatomic localization. FASTING BLOOD GLUCOSE:  Value: 94 mg/dl COMPARISON:  None. FINDINGS: HEAD/NECK No hypermetabolic activity in the scalp. No hypermetabolic cervical lymph nodes. CHEST No hypermetabolic mediastinal or hilar nodes. Calcified right hilar mediastinal lymph nodes, consistent with old granulomatous disease. Calcified granuloma also noted in the posterior right lower lobe. No suspicious pulmonary nodules on the CT scan. ABDOMEN/PELVIS No abnormal hypermetabolic activity within the liver, pancreas, adrenal glands, or spleen. No hypermetabolic lymph nodes in the abdomen or pelvis. 2.3 cm benign low-attenuation right adrenal adenoma shows no FDG uptake. Mild to moderate enlargement prostate gland seen which indents the bladder base. Mild diffuse bladder wall thickening is consistent with chronic bladder outlet obstruction. Aortic atherosclerosis. SKELETON No focal hypermetabolic lesions to suggest skeletal metastasis. EXTREMITIES No abnormal hypermetabolic lesions in the lower extremities. IMPRESSION: No evidence of metabolically active malignancy. Moderately enlarged prostate gland, with findings of chronic bladder outlet obstruction. Electronically Signed   By: Earle Gell M.D.   On: 07/22/2017 16:21    Labs:  CBC: Recent Labs    07/03/17 0747 07/23/17 0711  WBC 3.0* 4.3  HGB 14.8 14.9  HCT 44.9 44.4  PLT 159 203    COAGS: Recent Labs    07/23/17 0711  INR 0.95    BMP: Recent Labs    04/17/17 1656 06/02/17 1036 07/03/17 0747  NA  --  136 141  K  --  4.6 4.1  CL  --  101  --   CO2  --  29 25  GLUCOSE  --  98 95  BUN  --  15 16.3  CALCIUM 8.5* 10.0 9.6  CREATININE  --  0.98 1.2    LIVER FUNCTION TESTS: Recent Labs    04/17/17 1656 07/03/17 0747 07/03/17 0747  BILITOT 0.4 0.35  --   AST 19 20  --     ALT 24 22  --   ALKPHOS 51 55  --   PROT 7.4 7.4 6.7  ALBUMIN 4.7 4.3  --     TUMOR MARKERS: No results for input(s): AFPTM, CEA, CA199, CHROMGRNA in the last 8760 hours.  Assessment and Plan: 56 y.o. male with history of MGUS/abnormal SPEP who presents today for CT guided bone marrow biopsy to r/o myeloma. Risks and benefits discussed with the patient/spouse including, but not limited to bleeding, infection, damage to adjacent structures or low yield requiring additional tests. All of the patient's questions were answered, patient is agreeable to proceed. Consent signed and in chart.     Thank you for this interesting consult.  I greatly enjoyed meeting Anthony [name] and look forward to participating in their care.  A copy of this report  was sent to the requesting provider on this date.  Electronically Signed: D. Rowe Robert, PA-C 07/23/2017, 8:15 AM   I spent a total of 20 minutes  in face to face in clinical consultation, greater than 50% of which was counseling/coordinating care for CT guided bone marrow biopsy

## 2017-07-23 NOTE — Procedures (Signed)
R iliac BM aspirate and core EBL 0 Comp 0 

## 2017-07-26 ENCOUNTER — Encounter: Payer: Self-pay | Admitting: Hematology and Oncology

## 2017-07-26 NOTE — Assessment & Plan Note (Signed)
56 y.o. male with nonspecific abnormality in the serum protein electrophoresis, potentially suggestive of monoclonal gammopathy of unknown significance.  Recent studies confirmed presence of monoclonal gammopathy with IgA kappa light chains with small amount of paraprotein in the peripheral blood.  Additional assessment for possible presence of multiple myeloma or amyloidosis will be necessary at this time.  Plan: --PET/CT --Consult interventional radiology for bone marrow biopsy --Consult medical geneticist based on additional family history of provided today --Return to clinic 2 weeks after bone marrow biopsy to review findings.

## 2017-07-26 NOTE — Progress Notes (Signed)
[facility] Follow-up Visit:  Assessment: MGUS (monoclonal gammopathy of unknown significance) 56 y.o. male with nonspecific abnormality in the serum protein electrophoresis, potentially suggestive of monoclonal gammopathy of unknown significance.  Recent studies confirmed presence of monoclonal gammopathy with IgA kappa light chains with small amount of paraprotein in the peripheral blood.  Additional assessment for possible presence of multiple myeloma or amyloidosis will be necessary at this time.  Plan: --PET/CT --Consult interventional radiology for bone marrow biopsy --Consult medical geneticist based on additional family history of provided today --Return to clinic 2 weeks after bone marrow biopsy to review findings.  Voice recognition software was used and creation of this note. Despite my best effort at editing the text, some misspelling/errors may have occurred.  Orders Placed This Encounter  Procedures  . NM PET Image Initial (PI) Whole Body    Standing Status:   Future    Number of Occurrences:   1    Standing Expiration Date:   07/09/2018    Order Specific Question:   If indicated for the ordered procedure, I authorize the administration of a radiopharmaceutical per Radiology protocol    Answer:   Yes    Order Specific Question:   Preferred imaging location?    Answer:   [facility]    Order Specific Question:   Radiology Contrast Protocol - do NOT remove file path    Answer:   [email or web address]    Order Specific Question:   Reason for Exam additional comments    Answer:   MGUS, evaluating for possible multiple myeloma. Sp attention to femoral necks for possible fracture-threatening lesions  . CT Biopsy    Standing Status:   Future    Number of Occurrences:   1    Standing Expiration Date:   07/09/2018    Order Specific Question:   Lab orders requested (DO NOT place separate lab orders, these will be  automatically ordered during procedure specimen collection):    Answer:   Cytology - Non Pap    Comments:   Flow cyto, cytogenetics, MM FISH panel    Order Specific Question:   Lab orders requested (DO NOT place separate lab orders, these will be automatically ordered during procedure specimen collection):    Answer:   Surgical Pathology    Order Specific Question:   Lab orders requested (DO NOT place separate lab orders, these will be automatically ordered during procedure specimen collection):    Answer:   Oligo clonal bands    Order Specific Question:   Reason for Exam (SYMPTOM  OR DIAGNOSIS REQUIRED)    Answer:   MGUS, evaluating for multiple myeloma    Order Specific Question:   Preferred imaging location?    Answer:   [facility]    Order Specific Question:   Radiology Contrast Protocol - do NOT remove file path    Answer:   [email or web address]  . CT BONE MARROW BIOPSY & ASPIRATION    Standing Status:   Future    Number of Occurrences:   1    Standing Expiration Date:   10/09/2018    Order Specific Question:   Reason for Exam (SYMPTOM  OR DIAGNOSIS REQUIRED)    Answer:   MGUS, evaluating for Multiple myeloma    Order Specific Question:   Preferred imaging location?    Answer:   [facility]    Order Specific Question:   Radiology Contrast Protocol - do NOT remove file path  Answer:   [email or web address]    Cancer Staging No matching staging information was found for the patient.  All questions were answered.  . The patient knows to call the clinic with any problems, questions or concerns.  This note was electronically signed.    History of Presenting Illness Anthony [name] is a 56 y.o. male followed in the [facility] for evaluation of abnormal serum protein referred by Dr. Renato Shin.  Please see hematological data available below.  At the present time, active complaints of the patient  include chronic back pain for the past 20 years.  Patient does have no radiation of the pain in the lower extremities.  He denies any changes in sensation or strength in the distal extremities.  Patient has diagnosis of osteoporosis and underwent a bone scan which demonstrated 3 stress fractures in the vertebral bodies.  With that, additional evaluation was obtained demonstrating elevated IgA as outlined below.  Returns to the clinic to review of the recent lab work.  Denies any new symptoms since the last visit to the clinic.  Patient denies any symptoms from the respiratory, cardiovascular, genitourinary, or neurological systems.  No recent change in appetite or activity tolerance, no fevers, chills, night sweats.  Oncological/hematological History: --Labs, 04/17/17: SPEP -- abnormal protein band in the beta globulins and possible second abnormal band in the gamma globulins; IgG 782, IgA 653, IgM 68; --Labs, 07/03/17: tProt 7.4, Alb 4.3, Ca 9.6, Cr 1.2, AP 55, LDH 147; SPEP -- M-spike 0.2g/dL, SIFE -- monoclonal IgA kappa; IgG 722, IgA 565, IgM 60; kappa 45.3, lambda 10.8, KLR 4.19; WBC 3.0, Hgb 14.8, Plt 159;    Medical History: Past Medical History:  Diagnosis Date  . Back pain     Surgical History: No past surgical history on file.  Family History: Family History  Problem Relation Age of Onset  . Osteoporosis Neg Hx     Social History: Social History   Socioeconomic History  . Marital status: Married    Spouse name: Not on file  . Number of children: Not on file  . Years of education: Not on file  . Highest education level: Not on file  Social Needs  . Financial resource strain: Not on file  . Food insecurity - worry: Not on file  . Food insecurity - inability: Not on file  . Transportation needs - medical: Not on file  . Transportation needs - non-medical: Not on file  Occupational History  . Not on file  Tobacco Use  . Smoking status: Never Smoker  . Smokeless  tobacco: Former Systems developer    Types: Chew, Snuff  Substance and Sexual Activity  . Alcohol use: Yes    Alcohol/week: 1.2 oz    Types: 2 Glasses of wine per week  . Drug use: No  . Sexual activity: Yes  Other Topics Concern  . Not on file  Social History Narrative  . Not on file    Allergies: No Known Allergies  Medications:  Current Outpatient Medications  Medication Sig Dispense Refill  . aluminum chloride (DRYSOL) 20 % external solution Apply 1 application topically at bedtime.    Marland Kitchen aspirin EC 81 MG tablet Take 81 mg by mouth daily.    . diclofenac sodium (VOLTAREN) 1 % GEL Apply 2 g topically as needed.    . ibandronate (BONIVA) 150 MG tablet Take 1 tablet (150 mg total) by mouth every 30 (thirty) days. Take in the morning with a full glass of water,  on an empty stomach, and do not take anything else by mouth or lie down for the next 30 min. 3 tablet 3  . ibuprofen (ADVIL,MOTRIN) 200 MG tablet Take 200 mg by mouth every 6 (six) hours as needed for moderate pain.    . naftifine (NAFTIN) 1 % cream Apply 1 application topically daily.    . Omega-3 Fatty Acids (FISH OIL) 1360 MG CAPS Take 1 capsule by mouth daily.    Marland Kitchen terbinafine (LAMISIL) 1 % cream Apply 1 application topically 2 (two) times daily.    Marland Kitchen triamcinolone (KENALOG) 0.025 % cream Apply 1 application topically 2 (two) times daily.    . calcium-vitamin D (OSCAL WITH D) 250-125 MG-UNIT tablet Take 1 tablet by mouth daily.    . Multiple Vitamin (MULTIVITAMIN) tablet Take 1 tablet by mouth daily.    Marland Kitchen oxyCODONE (OXY IR/ROXICODONE) 5 MG immediate release tablet Take 1-2 tablets (5-10 mg total) by mouth every 4 (four) hours as needed for moderate pain or severe pain. (Patient not taking: Reported on 04/17/2017) 40 tablet 0   No current facility-administered medications for this visit.     Review of Systems: Review of Systems  Musculoskeletal: Positive for back pain.  All other systems reviewed and are negative.    PHYSICAL  EXAMINATION Blood pressure 139/86, pulse 68, temperature 99.1 F (37.3 C), temperature source Oral, resp. rate 18, height 5' 7"  (1.702 m), weight 205 lb 11.2 oz (93.3 kg), SpO2 99 %.  ECOG PERFORMANCE STATUS: 0 - Asymptomatic  Physical Exam  Constitutional: He is oriented to person, place, and time and well-developed, well-nourished, and in no distress. No distress.  HENT:  Head: Normocephalic and atraumatic.  Mouth/Throat: Oropharynx is clear and moist. No oropharyngeal exudate.  Eyes: Conjunctivae and EOM are normal. Pupils are equal, round, and reactive to light. No scleral icterus.  Neck: No thyromegaly present.  Cardiovascular: Normal rate, regular rhythm and normal heart sounds.  No murmur heard. Pulmonary/Chest: Effort normal and breath sounds normal. No respiratory distress. He has no wheezes. He has no rales.  Abdominal: Soft. Bowel sounds are normal. He exhibits no distension. There is no tenderness. There is no rebound and no guarding.  Musculoskeletal: He exhibits no edema or deformity.  Lymphadenopathy:    He has no cervical adenopathy.  Neurological: He is alert and oriented to person, place, and time. He has normal reflexes. No cranial nerve deficit.  Skin: Skin is warm and dry. No rash noted. He is not diaphoretic. No erythema. No pallor.     LABORATORY DATA: I have personally reviewed the data as listed: Appointment on 07/03/2017  Component Date Value Ref Range Status  . WBC 07/03/2017 3.0* 4.0 - 10.3 10e3/uL Final  . NEUT# 07/03/2017 1.7  1.5 - 6.5 10e3/uL Final  . HGB 07/03/2017 14.8  13.0 - 17.1 g/dL Final  . HCT 07/03/2017 44.9  38.4 - 49.9 % Final  . Platelets 07/03/2017 159  140 - 400 10e3/uL Final  . MCV 07/03/2017 86.2  79.3 - 98.0 fL Final  . MCH 07/03/2017 28.4  27.2 - 33.4 pg Final  . MCHC 07/03/2017 33.0  32.0 - 36.0 g/dL Final  . RBC 07/03/2017 5.21  4.20 - 5.82 10e6/uL Final  . RDW 07/03/2017 14.5  11.0 - 14.6 % Final  . lymph# 07/03/2017 0.8* 0.9  - 3.3 10e3/uL Final  . MONO# 07/03/2017 0.3  0.1 - 0.9 10e3/uL Final  . Eosinophils Absolute 07/03/2017 0.2  0.0 - 0.5 10e3/uL Final  .  Basophils Absolute 07/03/2017 0.0  0.0 - 0.1 10e3/uL Final  . NEUT% 07/03/2017 57.7  39.0 - 75.0 % Final  . LYMPH% 07/03/2017 27.4  14.0 - 49.0 % Final  . MONO% 07/03/2017 9.1  0.0 - 14.0 % Final  . EOS% 07/03/2017 5.2  0.0 - 7.0 % Final  . BASO% 07/03/2017 0.6  0.0 - 2.0 % Final  . Sodium 07/03/2017 141  136 - 145 mEq/L Final  . Potassium 07/03/2017 4.1  3.5 - 5.1 mEq/L Final  . Chloride 07/03/2017 106  98 - 109 mEq/L Final  . CO2 07/03/2017 25  22 - 29 mEq/L Final  . Glucose 07/03/2017 95  70 - 140 mg/dl Final   Glucose reference range is for nonfasting patients. Fasting glucose reference range is 70- 100.  Marland Kitchen BUN 07/03/2017 16.3  7.0 - 26.0 mg/dL Final  . Creatinine 07/03/2017 1.2  0.7 - 1.3 mg/dL Final  . Total Bilirubin 07/03/2017 0.35  0.20 - 1.20 mg/dL Final  . Alkaline Phosphatase 07/03/2017 55  40 - 150 U/L Final  . AST 07/03/2017 20  5 - 34 U/L Final  . ALT 07/03/2017 22  0 - 55 U/L Final  . Total Protein 07/03/2017 7.4  6.4 - 8.3 g/dL Final  . Albumin 07/03/2017 4.3  3.5 - 5.0 g/dL Final  . Calcium 07/03/2017 9.6  8.4 - 10.4 mg/dL Final  . Anion Gap 07/03/2017 10  3 - 11 mEq/L Final  . EGFR 07/03/2017 >60  >60 ml/min/1.73 m2 Final   eGFR is calculated using the CKD-EPI Creatinine Equation (2009)  . LDH 07/03/2017 147  125 - 245 U/L Final  . IgG, Qn, Serum 07/03/2017 722  700 - 1,600 mg/dL Final  . IgA, Qn, Serum 07/03/2017 565* 90 - 386 mg/dL Final  . IgM, Qn, Serum 07/03/2017 60  20 - 172 mg/dL Final  . Total Protein 07/03/2017 6.7  6.0 - 8.5 g/dL Final  . Albumin SerPl Elph-Mcnc 07/03/2017 4.1  2.9 - 4.4 g/dL Final  . Alpha 1 07/03/2017 0.2  0.0 - 0.4 g/dL Final  . Alpha2 Glob SerPl Elph-Mcnc 07/03/2017 0.5  0.4 - 1.0 g/dL Final  . B-Globulin SerPl Elph-Mcnc 07/03/2017 1.2  0.7 - 1.3 g/dL Final  . Gamma Glob SerPl Elph-Mcnc 07/03/2017  0.7  0.4 - 1.8 g/dL Final  . M Protein SerPl Elph-Mcnc 07/03/2017 0.2* Not Observed g/dL Final  . Globulin, Total 07/03/2017 2.6  2.2 - 3.9 g/dL Final  . Albumin/Glob SerPl 07/03/2017 1.6  0.7 - 1.7 Final  . IFE 1 07/03/2017 Comment   Final   Comment: Immunofixation shows IgA monoclonal protein with kappa light chain specificity.   . Please Note 07/03/2017 Comment   Final   Comment: Protein electrophoresis scan will follow via computer, mail, or courier delivery.   Harriet Pho Free Light Chain 07/03/2017 45.3* 3.3 - 19.4 mg/L Final  . Ig Lambda Free Light Chain 07/03/2017 10.8  5.7 - 26.3 mg/L Final  . Kappa/Lambda FluidC Ratio 07/03/2017 4.19* 0.26 - 1.65 Final  . Beta-2 07/03/2017 1.8  0.6 - 2.4 mg/L Final   Siemens Immulite 2000 Immunochemiluminometric assay (ICMA)       Ardath Sax, MD

## 2017-07-30 ENCOUNTER — Ambulatory Visit: Payer: BLUE CROSS/BLUE SHIELD | Admitting: Hematology

## 2017-08-05 ENCOUNTER — Encounter: Payer: Self-pay | Admitting: Hematology

## 2017-08-05 ENCOUNTER — Telehealth: Payer: Self-pay | Admitting: Hematology and Oncology

## 2017-08-05 ENCOUNTER — Ambulatory Visit (HOSPITAL_BASED_OUTPATIENT_CLINIC_OR_DEPARTMENT_OTHER): Payer: BLUE CROSS/BLUE SHIELD | Admitting: Hematology

## 2017-08-05 VITALS — BP 142/92 | HR 68 | Temp 97.5°F | Resp 18 | Ht 67.0 in | Wt 218.1 lb

## 2017-08-05 DIAGNOSIS — D472 Monoclonal gammopathy: Secondary | ICD-10-CM

## 2017-08-05 NOTE — Telephone Encounter (Signed)
Gave avs and calendar for march 2019 

## 2017-08-05 NOTE — Progress Notes (Signed)
HEMATOLOGY ONCOLOGY PROGRESS NOTE  Date of service: 08/05/17  Patient Care Team: Lennie Odor, PA-C as PCP - General (Nurse Practitioner)  CC F/u for MGUS  INTERVAL HISTORY:  Anthony [name] is here for fu of his MGUS. He is accompanied by his wife. He recently underwent a bone marrow bx on 07/23/2017. He had a PET scan on 07/22/2017 with results showing: IMPRESSION: No evidence of metabolically active malignancy. Moderately enlarged prostate gland, with findings of chronic bladder outlet obstruction.   On ROS, pt reports lower back pain and denies fever, chills, night sweats and any other accompanying symptoms.  REVIEW OF SYSTEMS:    10 Point review of systems of done and is negative except as noted above.  . Past Medical History:  Diagnosis Date  . Back pain     .No past surgical history on file.  . Social History   Tobacco Use  . Smoking status: Never Smoker  . Smokeless tobacco: Former Systems developer    Types: Chew, Snuff  Substance Use Topics  . Alcohol use: Yes    Alcohol/week: 1.2 oz    Types: 2 Glasses of wine per week  . Drug use: No    ALLERGIES:  has No Known Allergies.  MEDICATIONS:  Current Outpatient Medications  Medication Sig Dispense Refill  . aluminum chloride (DRYSOL) 20 % external solution Apply 1 application topically at bedtime.    Marland Kitchen aspirin EC 81 MG tablet Take 81 mg by mouth daily.    . calcium-vitamin D (OSCAL WITH D) 250-125 MG-UNIT tablet Take 1 tablet by mouth daily.    . diclofenac sodium (VOLTAREN) 1 % GEL Apply 2 g topically as needed.    . ibandronate (BONIVA) 150 MG tablet Take 1 tablet (150 mg total) by mouth every 30 (thirty) days. Take in the morning with a full glass of water, on an empty stomach, and do not take anything else by mouth or lie down for the next 30 min. 3 tablet 3  . ibuprofen (ADVIL,MOTRIN) 200 MG tablet Take 200 mg by mouth every 6 (six) hours as needed for moderate pain.    . Multiple Vitamin (MULTIVITAMIN) tablet  Take 1 tablet by mouth daily.    . naftifine (NAFTIN) 1 % cream Apply 1 application topically daily.    . Omega-3 Fatty Acids (FISH OIL) 1360 MG CAPS Take 1 capsule by mouth daily.    Marland Kitchen oxyCODONE (OXY IR/ROXICODONE) 5 MG immediate release tablet Take 1-2 tablets (5-10 mg total) by mouth every 4 (four) hours as needed for moderate pain or severe pain. (Patient not taking: Reported on 04/17/2017) 40 tablet 0  . terbinafine (LAMISIL) 1 % cream Apply 1 application topically 2 (two) times daily.    Marland Kitchen triamcinolone (KENALOG) 0.025 % cream Apply 1 application topically 2 (two) times daily.     No current facility-administered medications for this visit.     PHYSICAL EXAMINATION: ECOG PERFORMANCE STATUS: 1 - Symptomatic but completely ambulatory  .There were no vitals filed for this visit.  There were no vitals filed for this visit. .There is no height or weight on file to calculate BMI.  GENERAL:alert, in no acute distress and comfortable SKIN: no acute rashes, no significant lesions EYES: conjunctiva are pink and non-injected, sclera anicteric OROPHARYNX: MMM, no exudates, no oropharyngeal erythema or ulceration NECK: supple, no JVD LYMPH:  no palpable lymphadenopathy in the cervical, axillary or inguinal regions LUNGS: clear to auscultation b/l with normal respiratory effort HEART: regular rate & rhythm ABDOMEN:  normoactive bowel sounds , non tender, not distended. Extremity: no pedal edema PSYCH: alert & oriented x 3 with fluent speech NEURO: no focal motor/sensory deficits  LABORATORY DATA:   I have reviewed the data as listed  . CBC Latest Ref Rng & Units 07/23/2017 07/03/2017 09/26/2014  WBC 4.0 - 10.5 K/uL 4.3 3.0(L) 4.5  Hemoglobin 13.0 - 17.0 g/dL 14.9 14.8 13.8  Hematocrit 39.0 - 52.0 % 44.4 44.9 41.3  Platelets 150 - 400 K/uL 203 159 246    . CMP Latest Ref Rng & Units 07/03/2017 07/03/2017 06/02/2017  Glucose 70 - 140 mg/dl 95 - 98  BUN 7.0 - 26.0 mg/dL 16.3 - 15    Creatinine 0.7 - 1.3 mg/dL 1.2 - 0.98  Sodium 136 - 145 mEq/L 141 - 136  Potassium 3.5 - 5.1 mEq/L 4.1 - 4.6  Chloride 96 - 112 mEq/L - - 101  CO2 22 - 29 mEq/L 25 - 29  Calcium 8.4 - 10.4 mg/dL 9.6 - 10.0  Total Protein 6.0 - 8.5 g/dL 7.4 6.7 -  Total Bilirubin 0.20 - 1.20 mg/dL 0.35 - -  Alkaline Phos 40 - 150 U/L 55 - -  AST 5 - 34 U/L 20 - -  ALT 0 - 55 U/L 22 - -            RADIOGRAPHIC STUDIES: I have personally reviewed the radiological images as listed and agreed with the findings in the report. Nm Pet Image Initial (pi) Whole Body  Result Date: 07/22/2017 CLINICAL DATA:  Initial treatment strategy for monoclonal gammopathy of unknown significance. Possible multiple myeloma. EXAM: NUCLEAR MEDICINE PET WHOLE BODY TECHNIQUE: 10.3 mCi F-18 FDG was injected intravenously. Full-ring PET imaging was performed from the vertex to the feet after the radiotracer. CT data was obtained and used for attenuation correction and anatomic localization. FASTING BLOOD GLUCOSE:  Value: 94 mg/dl COMPARISON:  None. FINDINGS: HEAD/NECK No hypermetabolic activity in the scalp. No hypermetabolic cervical lymph nodes. CHEST No hypermetabolic mediastinal or hilar nodes. Calcified right hilar mediastinal lymph nodes, consistent with old granulomatous disease. Calcified granuloma also noted in the posterior right lower lobe. No suspicious pulmonary nodules on the CT scan. ABDOMEN/PELVIS No abnormal hypermetabolic activity within the liver, pancreas, adrenal glands, or spleen. No hypermetabolic lymph nodes in the abdomen or pelvis. 2.3 cm benign low-attenuation right adrenal adenoma shows no FDG uptake. Mild to moderate enlargement prostate gland seen which indents the bladder base. Mild diffuse bladder wall thickening is consistent with chronic bladder outlet obstruction. Aortic atherosclerosis. SKELETON No focal hypermetabolic lesions to suggest skeletal metastasis. EXTREMITIES No abnormal hypermetabolic  lesions in the lower extremities. IMPRESSION: No evidence of metabolically active malignancy. Moderately enlarged prostate gland, with findings of chronic bladder outlet obstruction. Electronically Signed   By: Earle Gell M.D.   On: 07/22/2017 16:21   Ct Biopsy  Result Date: 07/23/2017 INDICATION: Possible multiple myeloma and back pain EXAM: CT BIOPSY; CT BONE MARROW BIOPSY AND ASPIRATION MEDICATIONS: None. ANESTHESIA/SEDATION: Fentanyl 100 mcg IV; Versed 2 mg IV Moderate Sedation Time:  10 minutes The patient was continuously monitored during the procedure by the interventional radiology nurse under my direct supervision. FLUOROSCOPY TIME:  None COMPLICATIONS: None immediate. PROCEDURE: Informed written consent was obtained from the patient after a thorough discussion of the procedural risks, benefits and alternatives. All questions were addressed. Maximal Sterile Barrier Technique was utilized including caps, mask, sterile gowns, sterile gloves, sterile drape, hand hygiene and skin antiseptic. A timeout was performed prior to  the initiation of the procedure. Under CT guidance, an 11 gauge needle was advanced into the right iliac bone. Aspirates and a core were obtained. Post biopsy images demonstrate no hemorrhage. Patient tolerated the procedure well without complication. Vital sign monitoring by nursing staff during the procedure will continue as patient is in the special procedures unit for post procedure observation. FINDINGS: The images document guide needle placement within the right iliac bone. Post biopsy images demonstrate no hemorrhage. IMPRESSION: Successful CT-guided right iliac bone marrow aspirate and core. Electronically Signed   By: Marybelle Killings M.D.   On: 07/23/2017 11:08   Ct Bone Marrow Biopsy & Aspiration  Result Date: 07/23/2017 INDICATION: Possible multiple myeloma and back pain EXAM: CT BIOPSY; CT BONE MARROW BIOPSY AND ASPIRATION MEDICATIONS: None. ANESTHESIA/SEDATION: Fentanyl  100 mcg IV; Versed 2 mg IV Moderate Sedation Time:  10 minutes The patient was continuously monitored during the procedure by the interventional radiology nurse under my direct supervision. FLUOROSCOPY TIME:  None COMPLICATIONS: None immediate. PROCEDURE: Informed written consent was obtained from the patient after a thorough discussion of the procedural risks, benefits and alternatives. All questions were addressed. Maximal Sterile Barrier Technique was utilized including caps, mask, sterile gowns, sterile gloves, sterile drape, hand hygiene and skin antiseptic. A timeout was performed prior to the initiation of the procedure. Under CT guidance, an 11 gauge needle was advanced into the right iliac bone. Aspirates and a core were obtained. Post biopsy images demonstrate no hemorrhage. Patient tolerated the procedure well without complication. Vital sign monitoring by nursing staff during the procedure will continue as patient is in the special procedures unit for post procedure observation. FINDINGS: The images document guide needle placement within the right iliac bone. Post biopsy images demonstrate no hemorrhage. IMPRESSION: Successful CT-guided right iliac bone marrow aspirate and core. Electronically Signed   By: Marybelle Killings M.D.   On: 07/23/2017 11:08    ASSESSMENT & PLAN:  Anthony [name] is a wonderful 56 y.o. male with   1. IgA Kappa Monoclonal Paraproteinemia likely MGUS based on available results. BM Bx 5-10% plasma cells M spike of 0.2g/dl -increased SFLC ratio. PET/CT - no bone lesion PLAN -Explained abnormal protein (IgA) in the blood and what it means to pt and his wife.  -Discussed BM bx results in detail and does not solely suggest MM  ( likely MGUS) -labs reviewed - no evidence of anemia, RF or hypercalcemia l- he does not meet CRAB criteria and no evidence of overt injury of MM -no indication for treatment of the plasma cell dysdrais at this time. -PET/CT did not show any  evidence of malignant activity in bone but explained it did show enlarged prostate incidentally -Explained MGUS and risk of MGUS transforming into Myeloma.  -Would recommend monitoring to ensure this is not progressing  -If Dr. Loanne Drilling concludes pt has osteoporosis, would recommend Prolia  -FU with Dr. Lebron Conners for further evaluation and management  RTC in 3 months (Labs 1 week prior to clinic visit)  I spent 20 minutes counseling the patient face to face. The total time spent in the appointment was 30 minutes and more than 50% was on counseling and direct patient cares.    Sullivan Lone MD [place] AAHIVMS [facility] Hematology/Oncology Physician [facility]  (Office):       [phone] (Work cell):  [phone] (Fax):           [phone]  This document serves as a record of services personally performed  by Sullivan Lone, MD. It was created on his behalf by Alean Rinne, a trained medical scribe. The creation of this record is based on the scribe's personal observations and the provider's statements to them.   .I have reviewed the above documentation for accuracy and completeness, and I agree with the above. Brunetta Genera MD MS

## 2017-08-07 ENCOUNTER — Encounter (HOSPITAL_COMMUNITY): Payer: Self-pay

## 2017-08-07 LAB — CHROMOSOME ANALYSIS, BONE MARROW

## 2017-08-07 LAB — TISSUE HYBRIDIZATION (BONE MARROW)-NCBH

## 2017-09-05 DIAGNOSIS — N448 Other noninflammatory disorders of the testis: Secondary | ICD-10-CM | POA: Diagnosis not present

## 2017-09-10 DIAGNOSIS — F4321 Adjustment disorder with depressed mood: Secondary | ICD-10-CM | POA: Diagnosis not present

## 2017-09-17 DIAGNOSIS — F4321 Adjustment disorder with depressed mood: Secondary | ICD-10-CM | POA: Diagnosis not present

## 2017-09-24 DIAGNOSIS — F4321 Adjustment disorder with depressed mood: Secondary | ICD-10-CM | POA: Diagnosis not present

## 2017-10-02 DIAGNOSIS — F4321 Adjustment disorder with depressed mood: Secondary | ICD-10-CM | POA: Diagnosis not present

## 2017-10-09 DIAGNOSIS — F4321 Adjustment disorder with depressed mood: Secondary | ICD-10-CM | POA: Diagnosis not present

## 2017-10-16 DIAGNOSIS — F4321 Adjustment disorder with depressed mood: Secondary | ICD-10-CM | POA: Diagnosis not present

## 2017-10-23 DIAGNOSIS — F4321 Adjustment disorder with depressed mood: Secondary | ICD-10-CM | POA: Diagnosis not present

## 2017-10-27 ENCOUNTER — Inpatient Hospital Stay: Payer: BLUE CROSS/BLUE SHIELD | Attending: Hematology and Oncology

## 2017-10-27 DIAGNOSIS — M818 Other osteoporosis without current pathological fracture: Secondary | ICD-10-CM | POA: Diagnosis not present

## 2017-10-27 DIAGNOSIS — G8929 Other chronic pain: Secondary | ICD-10-CM | POA: Diagnosis not present

## 2017-10-27 DIAGNOSIS — M549 Dorsalgia, unspecified: Secondary | ICD-10-CM | POA: Insufficient documentation

## 2017-10-27 DIAGNOSIS — D472 Monoclonal gammopathy: Secondary | ICD-10-CM | POA: Diagnosis not present

## 2017-10-27 LAB — RETICULOCYTES
RBC.: 5.59 MIL/uL (ref 4.20–5.82)
Retic Count, Absolute: 89.4 10*3/uL (ref 34.8–93.9)
Retic Ct Pct: 1.6 % (ref 0.8–1.8)

## 2017-10-27 LAB — COMPREHENSIVE METABOLIC PANEL
ALBUMIN: 4.1 g/dL (ref 3.5–5.0)
ALT: 24 U/L (ref 0–55)
AST: 17 U/L (ref 5–34)
Alkaline Phosphatase: 56 U/L (ref 40–150)
Anion gap: 9 (ref 3–11)
BUN: 19 mg/dL (ref 7–26)
CHLORIDE: 108 mmol/L (ref 98–109)
CO2: 22 mmol/L (ref 22–29)
CREATININE: 0.97 mg/dL (ref 0.70–1.30)
Calcium: 9.5 mg/dL (ref 8.4–10.4)
GFR calc Af Amer: >60 mL/min (ref >60)
GFR calc non Af Amer: >60 mL/min (ref >60)
GLUCOSE: 97 mg/dL (ref 70–140)
POTASSIUM: 4.5 mmol/L (ref 3.5–5.1)
Sodium: 139 mmol/L (ref 136–145)
Total Bilirubin: 0.5 mg/dL (ref 0.2–1.2)
Total Protein: 7.4 g/dL (ref 6.4–8.3)

## 2017-10-27 LAB — CBC WITH DIFFERENTIAL (CANCER CENTER ONLY)
Basophils Absolute: 0 10*3/uL (ref 0.0–0.1)
Basophils Relative: 0 %
EOS ABS: 0.2 10*3/uL (ref 0.0–0.5)
Eosinophils Relative: 6 %
HCT: 48.4 % (ref 38.4–49.9)
Hemoglobin: 16.6 g/dL (ref 13.0–17.1)
LYMPHS ABS: 1.5 10*3/uL (ref 0.9–3.3)
LYMPHS PCT: 37 %
MCH: 29.7 pg (ref 27.2–33.4)
MCHC: 34.3 g/dL (ref 32.0–36.0)
MCV: 86.6 fL (ref 79.3–98.0)
MONO ABS: 0.5 10*3/uL (ref 0.1–0.9)
MONOS PCT: 12 %
Neutro Abs: 1.8 10*3/uL (ref 1.5–6.5)
Neutrophils Relative %: 45 %
Platelet Count: 167 10*3/uL (ref 140–400)
RBC: 5.59 MIL/uL (ref 4.20–5.82)
RDW: 13.7 % (ref 11.0–14.6)
WBC: 4.1 10*3/uL (ref 4.0–10.3)

## 2017-10-28 LAB — KAPPA/LAMBDA LIGHT CHAINS
Kappa free light chain: 59.1 mg/L — ABNORMAL HIGH (ref 3.3–19.4)
Kappa, lambda light chain ratio: 4.73 — ABNORMAL HIGH (ref 0.26–1.65)
LAMDA FREE LIGHT CHAINS: 12.5 mg/L (ref 5.7–26.3)

## 2017-10-30 DIAGNOSIS — F4321 Adjustment disorder with depressed mood: Secondary | ICD-10-CM | POA: Diagnosis not present

## 2017-10-30 DIAGNOSIS — F509 Eating disorder, unspecified: Secondary | ICD-10-CM | POA: Diagnosis not present

## 2017-10-30 LAB — MULTIPLE MYELOMA PANEL, SERUM
ALPHA2 GLOB SERPL ELPH-MCNC: 0.5 g/dL (ref 0.4–1.0)
Albumin SerPl Elph-Mcnc: 4.1 g/dL (ref 2.9–4.4)
Albumin/Glob SerPl: 1.4 (ref 0.7–1.7)
Alpha 1: 0.2 g/dL (ref 0.0–0.4)
B-Globulin SerPl Elph-Mcnc: 1.5 g/dL — ABNORMAL HIGH (ref 0.7–1.3)
Gamma Glob SerPl Elph-Mcnc: 0.8 g/dL (ref 0.4–1.8)
Globulin, Total: 3 g/dL (ref 2.2–3.9)
IGG (IMMUNOGLOBIN G), SERUM: 831 mg/dL (ref 700–1600)
IGM (IMMUNOGLOBULIN M), SRM: 66 mg/dL (ref 20–172)
IgA: 617 mg/dL — ABNORMAL HIGH (ref 90–386)
M Protein SerPl Elph-Mcnc: 0.5 g/dL — ABNORMAL HIGH
TOTAL PROTEIN ELP: 7.1 g/dL (ref 6.0–8.5)

## 2017-11-02 ENCOUNTER — Encounter (INDEPENDENT_AMBULATORY_CARE_PROVIDER_SITE_OTHER): Payer: Self-pay

## 2017-11-03 ENCOUNTER — Inpatient Hospital Stay (HOSPITAL_BASED_OUTPATIENT_CLINIC_OR_DEPARTMENT_OTHER): Payer: BLUE CROSS/BLUE SHIELD | Admitting: Hematology and Oncology

## 2017-11-03 ENCOUNTER — Encounter: Payer: Self-pay | Admitting: Hematology and Oncology

## 2017-11-03 ENCOUNTER — Telehealth: Payer: Self-pay | Admitting: Hematology and Oncology

## 2017-11-03 VITALS — BP 137/93 | HR 72 | Temp 97.7°F | Resp 20 | Ht 67.0 in | Wt 226.3 lb

## 2017-11-03 DIAGNOSIS — G8929 Other chronic pain: Secondary | ICD-10-CM | POA: Diagnosis not present

## 2017-11-03 DIAGNOSIS — M549 Dorsalgia, unspecified: Secondary | ICD-10-CM | POA: Diagnosis not present

## 2017-11-03 DIAGNOSIS — M818 Other osteoporosis without current pathological fracture: Secondary | ICD-10-CM | POA: Diagnosis not present

## 2017-11-03 DIAGNOSIS — D472 Monoclonal gammopathy: Secondary | ICD-10-CM | POA: Diagnosis not present

## 2017-11-03 NOTE — Telephone Encounter (Signed)
Appointments scheduled AVS printed per 3/18 los

## 2017-11-05 DIAGNOSIS — F509 Eating disorder, unspecified: Secondary | ICD-10-CM | POA: Diagnosis not present

## 2017-11-06 DIAGNOSIS — F4321 Adjustment disorder with depressed mood: Secondary | ICD-10-CM | POA: Diagnosis not present

## 2017-11-10 ENCOUNTER — Encounter: Payer: Self-pay | Admitting: Hematology and Oncology

## 2017-11-10 NOTE — Progress Notes (Signed)
[facility] Follow-up Visit:  Assessment: MGUS (monoclonal gammopathy of unknown significance) 57 y.o. male with nonspecific abnormality in the serum protein electrophoresis, potentially suggestive of monoclonal gammopathy of unknown significance.  Recent studies confirmed presence of monoclonal gammopathy with IgA kappa light chains with small amount of paraprotein in the peripheral blood.  Additional assessment revealed presence of a small monoclonal plasma cell population in the bone marrow measuring 5-10%.  Patient did not fulfill any of the CRAB criteria for diagnosis of multiple myeloma at that time.  Repeat lab work today demonstrates gradual increase in the monoclonal protein levels including small increase in the kappa to lambda ratio compared to the last visit in the clinic.  No interval changes in the creatinine, calcium, or hematological parameters.  No new musculoskeletal complaints to suggest significant disease progression.  Plan: --Continue observation --Return to clinic in 2 months for continued monitoring with labs obtained 1 week prior.  Repeat bone marrow biopsy and imaging will be obtained on the appearance of new symptoms or if the biochemical parameters worsened significantly. Voice recognition software was used and creation of this note. Despite my best effort at editing the text, some misspelling/errors may have occurred.  Orders Placed This Encounter  Procedures  . Beta 2 microglobulin, serum    Standing Status:   Future    Standing Expiration Date:   05/07/2019  . CBC & Diff and Retic    Standing Status:   Future    Standing Expiration Date:   05/07/2019  . CMP ([facility] only)    Standing Status:   Future    Standing Expiration Date:   05/07/2019  . Kappa/lambda light chains    Standing Status:   Future    Standing Expiration Date:   05/07/2019  . Lactate dehydrogenase    Standing Status:   Future    Standing Expiration Date:   05/07/2019   . SPEP & IFE with QIG    Standing Status:   Future    Standing Expiration Date:   05/07/2019    Cancer Staging No matching staging information was found for the patient.  All questions were answered.  . The patient knows to call the clinic with any problems, questions or concerns.  This note was electronically signed.    History of Presenting Illness Anthony [name] is a 56 y.o. male followed in the [facility] for evaluation of abnormal serum protein referred by Dr. Renato Shin.  Please see hematological data available below.  At the present time, active complaints of the patient include chronic back pain for the past 20 years.  Patient does have no radiation of the pain in the lower extremities.  He denies any changes in sensation or strength in the distal extremities.  Patient has diagnosis of osteoporosis and underwent a bone scan which demonstrated 3 stress fractures in the vertebral bodies.  With that, additional evaluation was obtained demonstrating elevated IgA as outlined below.  Returns to the clinic to review of the recent lab work.  Denies any new symptoms since the last visit to the clinic.  Patient denies any symptoms from the respiratory, cardiovascular, genitourinary, or neurological systems.  No recent change in appetite or activity tolerance, no fevers, chills, night sweats.  Oncological/hematological History: **IgA kappa MGUS: --Labs, 04/17/17: SPEP -- abnormal protein band in the beta globulins and possible second abnormal band in the gamma globulins; IgG 782, IgA 653, IgM 68; --Labs, 07/03/17: tProt 7.4, Alb 4.3, Ca 9.6,  Cr 1.2, AP 55, LDH 147, beta-2 microglobulin ...; SPEP -- M-spike 0.2g/dL, SIFE -- monoclonal IgA kappa; IgG 722, IgA 565, IgM 60; kappa 45.3, lambda 10.8, KLR 4.19; WBC 3.0, Hgb 14.8, Plt 159;  --PET-CT, 07/22/18: No evidence of metabolically active malignancy, no evidence of lytic skeletal lesions. --BM Bx, 07/23/18: Pathology -- normocellular  bone marrow with monoclonal plasma cell population measuring 5-10% of total cellularity. FlowCyto --no aberrant monoclonal populations of lymphocytes, no elevation of blasts. CytoGen & FISH --no abnormal findings. --Labs, 10/27/17: tProt 7.4, Alb 4.1, Ca 9.5, Cr 1.0, AP 56, LDH    ..., beta-2 microglobulin ..., SPEP -- M-spike 0.5g/dL, SIFE -- monoclonal IgA kappa; IgG 831, IgA 617, IgM 66; kappa 59.1, lambda 12.5, KLR 4.73; WBC 4.1, Hgb 16.6, Plt 167;     Medical History: Past Medical History:  Diagnosis Date  . Back pain     Surgical History: No past surgical history on file.  Family History: Family History  Problem Relation Age of Onset  . Rheum arthritis Mother   . Other Sister        Pre-cancerous uterine mass;   . Rheum arthritis Sister   . Bone cancer Paternal Grandfather   . Brain cancer Maternal Uncle   . Brain cancer Maternal Aunt   . Osteoporosis Neg Hx     Social History: Social History   Socioeconomic History  . Marital status: Married    Spouse name: Not on file  . Number of children: Not on file  . Years of education: Not on file  . Highest education level: Not on file  Occupational History  . Not on file  Social Needs  . Financial resource strain: Not on file  . Food insecurity:    Worry: Not on file    Inability: Not on file  . Transportation needs:    Medical: Not on file    Non-medical: Not on file  Tobacco Use  . Smoking status: Never Smoker  . Smokeless tobacco: Former Systems developer    Types: Chew, Snuff  Substance and Sexual Activity  . Alcohol use: Yes    Alcohol/week: 1.2 oz    Types: 2 Glasses of wine per week  . Drug use: No  . Sexual activity: Yes  Lifestyle  . Physical activity:    Days per week: Not on file    Minutes per session: Not on file  . Stress: Not on file  Relationships  . Social connections:    Talks on phone: Not on file    Gets together: Not on file    Attends religious service: Not on file    Active member of club or  organization: Not on file    Attends meetings of clubs or organizations: Not on file    Relationship status: Not on file  . Intimate partner violence:    Fear of current or ex partner: Not on file    Emotionally abused: Not on file    Physically abused: Not on file    Forced sexual activity: Not on file  Other Topics Concern  . Not on file  Social History Narrative  . Not on file    Allergies: No Known Allergies  Medications:  Current Outpatient Medications  Medication Sig Dispense Refill  . aluminum chloride (DRYSOL) 20 % external solution Apply 1 application topically at bedtime.    Marland Kitchen aspirin EC 81 MG tablet Take 81 mg by mouth daily.    . calcium-vitamin D (OSCAL WITH D) 250-125  MG-UNIT tablet Take 1 tablet by mouth daily.    . diclofenac sodium (VOLTAREN) 1 % GEL Apply 2 g topically as needed.    . ibandronate (BONIVA) 150 MG tablet Take 1 tablet (150 mg total) by mouth every 30 (thirty) days. Take in the morning with a full glass of water, on an empty stomach, and do not take anything else by mouth or lie down for the next 30 min. 3 tablet 3  . ibuprofen (ADVIL,MOTRIN) 200 MG tablet Take 200 mg by mouth every 6 (six) hours as needed for moderate pain.    . Multiple Vitamin (MULTIVITAMIN) tablet Take 1 tablet by mouth daily.    . naftifine (NAFTIN) 1 % cream Apply 1 application topically daily.    . Omega-3 Fatty Acids (FISH OIL) 1360 MG CAPS Take 1 capsule by mouth daily.    Marland Kitchen terbinafine (LAMISIL) 1 % cream Apply 1 application topically 2 (two) times daily.    Marland Kitchen triamcinolone (KENALOG) 0.025 % cream Apply 1 application topically 2 (two) times daily.     No current facility-administered medications for this visit.     Review of Systems: Review of Systems  Musculoskeletal: Positive for back pain.  All other systems reviewed and are negative.    PHYSICAL EXAMINATION Blood pressure (!) 137/93, pulse 72, temperature 97.7 F (36.5 C), temperature source Oral, resp. rate  20, height 5' 7"  (1.702 m), weight 226 lb 4.8 oz (102.6 kg), SpO2 100 %.  ECOG PERFORMANCE STATUS: 0 - Asymptomatic  Physical Exam  Constitutional: He is oriented to person, place, and time and well-developed, well-nourished, and in no distress. No distress.  HENT:  Head: Normocephalic and atraumatic.  Mouth/Throat: Oropharynx is clear and moist. No oropharyngeal exudate.  Eyes: Pupils are equal, round, and reactive to light. Conjunctivae and EOM are normal. No scleral icterus.  Neck: No thyromegaly present.  Cardiovascular: Normal rate, regular rhythm and normal heart sounds.  No murmur heard. Pulmonary/Chest: Effort normal and breath sounds normal. No respiratory distress. He has no wheezes. He has no rales.  Abdominal: Soft. Bowel sounds are normal. He exhibits no distension. There is no tenderness. There is no rebound and no guarding.  Musculoskeletal: He exhibits no edema or deformity.  Lymphadenopathy:    He has no cervical adenopathy.  Neurological: He is alert and oriented to person, place, and time. He has normal reflexes. No cranial nerve deficit.  Skin: Skin is warm and dry. No rash noted. He is not diaphoretic. No erythema. No pallor.     LABORATORY DATA: I have personally reviewed the data as listed: No visits with results within 1 Week(s) from this visit.  Latest known visit with results is:  Appointment on 10/27/2017  Component Date Value Ref Range Status  . Kappa free light chain 10/27/2017 59.1* 3.3 - 19.4 mg/L Final  . Lamda free light chains 10/27/2017 12.5  5.7 - 26.3 mg/L Final  . Kappa, lamda light chain ratio 10/27/2017 4.73* 0.26 - 1.65 Final   Comment: (NOTE) Performed At: [facility], [place] [id number] Rush Farmer MD UD:[id number] Performed at [facility], [place] [address] [place], [place] [address]   . IgG (Immunoglobin G), Serum 10/27/2017 831  700 - 1,600 mg/dL Final  . IgA 10/27/2017 617*  90 - 386 mg/dL Final  . IgM (Immunoglobulin M), Srm 10/27/2017 66  20 - 172 mg/dL Final  . Total Protein ELP 10/27/2017 7.1  6.0 - 8.5 g/dL Corrected  .  Albumin SerPl Elph-Mcnc 10/27/2017 4.1  2.9 - 4.4 g/dL Corrected  . Alpha 1 10/27/2017 0.2  0.0 - 0.4 g/dL Corrected  . Alpha2 Glob SerPl Elph-Mcnc 10/27/2017 0.5  0.4 - 1.0 g/dL Corrected  . B-Globulin SerPl Elph-Mcnc 10/27/2017 1.5* 0.7 - 1.3 g/dL Corrected  . Gamma Glob SerPl Elph-Mcnc 10/27/2017 0.8  0.4 - 1.8 g/dL Corrected  . M Protein SerPl Elph-Mcnc 10/27/2017 0.5* Not Observed g/dL Corrected  . Globulin, Total 10/27/2017 3.0  2.2 - 3.9 g/dL Corrected  . Albumin/Glob SerPl 10/27/2017 1.4  0.7 - 1.7 Corrected  . IFE 1 10/27/2017 Comment   Corrected   Comment: (NOTE) Immunofixation shows IgA monoclonal protein with kappa light chain specificity.   . Please Note 10/27/2017 Comment   Corrected   Comment: (NOTE) Protein electrophoresis scan will follow via computer, mail, or courier delivery. Performed At: [facility] [place], [place] [id number] Rush Farmer MD YB:[id number] Performed at [facility], [place] [address], [place], [place] [address]   . Sodium 10/27/2017 139  136 - 145 mmol/L Final  . Potassium 10/27/2017 4.5  3.5 - 5.1 mmol/L Final  . Chloride 10/27/2017 108  98 - 109 mmol/L Final  . CO2 10/27/2017 22  22 - 29 mmol/L Final  . Glucose, Bld 10/27/2017 97  70 - 140 mg/dL Final  . BUN 10/27/2017 19  7 - 26 mg/dL Final  . Creatinine, Ser 10/27/2017 0.97  0.70 - 1.30 mg/dL Final  . Calcium 10/27/2017 9.5  8.4 - 10.4 mg/dL Final  . Total Protein 10/27/2017 7.4  6.4 - 8.3 g/dL Final  . Albumin 10/27/2017 4.1  3.5 - 5.0 g/dL Final  . AST 10/27/2017 17  5 - 34 U/L Final  . ALT 10/27/2017 24  0 - 55 U/L Final  . Alkaline Phosphatase 10/27/2017 56  40 - 150 U/L Final  . Total Bilirubin 10/27/2017 0.5  0.2 - 1.2 mg/dL Final  . GFR calc non Af Amer 10/27/2017 >60  >60 mL/min  Final  . GFR calc Af Amer 10/27/2017 >60  >60 mL/min Final   Comment: (NOTE) The eGFR has been calculated using the CKD EPI equation. This calculation has not been validated in all clinical situations. eGFR's persistently <60 mL/min signify possible Chronic Kidney Disease.   Georgiann Hahn gap 10/27/2017 9  3 - 11 Final   Performed at [facility], [place] [address], [place], [place] [address]  . WBC Count 10/27/2017 4.1  4.0 - 10.3 K/uL Final  . RBC 10/27/2017 5.59  4.20 - 5.82 MIL/uL Final  . Hemoglobin 10/27/2017 16.6  13.0 - 17.1 g/dL Final  . HCT 10/27/2017 48.4  38.4 - 49.9 % Final  . MCV 10/27/2017 86.6  79.3 - 98.0 fL Final  . MCH 10/27/2017 29.7  27.2 - 33.4 pg Final  . MCHC 10/27/2017 34.3  32.0 - 36.0 g/dL Final  . RDW 10/27/2017 13.7  11.0 - 14.6 % Final  . Platelet Count 10/27/2017 167  140 - 400 K/uL Final  . Neutrophils Relative % 10/27/2017 45  % Final  . Neutro Abs 10/27/2017 1.8  1.5 - 6.5 K/uL Final  . Lymphocytes Relative 10/27/2017 37  % Final  . Lymphs Abs 10/27/2017 1.5  0.9 - 3.3 K/uL Final  . Monocytes Relative 10/27/2017 12  % Final  . Monocytes Absolute 10/27/2017 0.5  0.1 - 0.9 K/uL Final  . Eosinophils Relative 10/27/2017 6  % Final  . Eosinophils Absolute  10/27/2017 0.2  0.0 - 0.5 K/uL Final  . Basophils Relative 10/27/2017 0  % Final  . Basophils Absolute 10/27/2017 0.0  0.0 - 0.1 K/uL Final   Performed at [facility], [place] [address] [place], [place] [address]  . Retic Ct Pct 10/27/2017 1.6  0.8 - 1.8 % Final  . RBC. 10/27/2017 5.59  4.20 - 5.82 MIL/uL Final  . Retic Count, Absolute 10/27/2017 89.4  34.8 - 93.9 K/uL Final   Performed at [facility], [place] [address], [place], [place] [address]       Ardath Sax, MD

## 2017-11-10 NOTE — Assessment & Plan Note (Signed)
57 y.o. male with nonspecific abnormality in the serum protein electrophoresis, potentially suggestive of monoclonal gammopathy of unknown significance.  Recent studies confirmed presence of monoclonal gammopathy with IgA kappa light chains with small amount of paraprotein in the peripheral blood.  Additional assessment revealed presence of a small monoclonal plasma cell population in the bone marrow measuring 5-10%.  Patient did not fulfill any of the CRAB criteria for diagnosis of multiple myeloma at that time.  Repeat lab work today demonstrates gradual increase in the monoclonal protein levels including small increase in the kappa to lambda ratio compared to the last visit in the clinic.  No interval changes in the creatinine, calcium, or hematological parameters.  No new musculoskeletal complaints to suggest significant disease progression.  Plan: --Continue observation --Return to clinic in 2 months for continued monitoring with labs obtained 1 week prior.  Repeat bone marrow biopsy and imaging will be obtained on the appearance of new symptoms or if the biochemical parameters worsened significantly.

## 2017-11-13 DIAGNOSIS — F4321 Adjustment disorder with depressed mood: Secondary | ICD-10-CM | POA: Diagnosis not present

## 2017-11-20 DIAGNOSIS — F4321 Adjustment disorder with depressed mood: Secondary | ICD-10-CM | POA: Diagnosis not present

## 2017-11-27 DIAGNOSIS — F4321 Adjustment disorder with depressed mood: Secondary | ICD-10-CM | POA: Diagnosis not present

## 2017-12-04 DIAGNOSIS — F4321 Adjustment disorder with depressed mood: Secondary | ICD-10-CM | POA: Diagnosis not present

## 2017-12-08 DIAGNOSIS — N448 Other noninflammatory disorders of the testis: Secondary | ICD-10-CM | POA: Diagnosis not present

## 2017-12-10 DIAGNOSIS — F4321 Adjustment disorder with depressed mood: Secondary | ICD-10-CM | POA: Diagnosis not present

## 2017-12-17 DIAGNOSIS — F4321 Adjustment disorder with depressed mood: Secondary | ICD-10-CM | POA: Diagnosis not present

## 2017-12-29 ENCOUNTER — Other Ambulatory Visit: Payer: Self-pay

## 2017-12-29 ENCOUNTER — Inpatient Hospital Stay: Payer: BLUE CROSS/BLUE SHIELD | Attending: Hematology and Oncology

## 2017-12-29 DIAGNOSIS — G8929 Other chronic pain: Secondary | ICD-10-CM | POA: Insufficient documentation

## 2017-12-29 DIAGNOSIS — D472 Monoclonal gammopathy: Secondary | ICD-10-CM | POA: Diagnosis not present

## 2017-12-29 DIAGNOSIS — M549 Dorsalgia, unspecified: Secondary | ICD-10-CM | POA: Diagnosis not present

## 2017-12-29 LAB — CMP (CANCER CENTER ONLY)
ALBUMIN: 4.5 g/dL (ref 3.5–5.0)
ALK PHOS: 57 U/L (ref 40–150)
ALT: 29 U/L (ref 0–55)
AST: 20 U/L (ref 5–34)
Anion gap: 8 (ref 3–11)
BUN: 17 mg/dL (ref 7–26)
CALCIUM: 9.7 mg/dL (ref 8.4–10.4)
CO2: 25 mmol/L (ref 22–29)
CREATININE: 1.12 mg/dL (ref 0.70–1.30)
Chloride: 105 mmol/L (ref 98–109)
GFR, Est AFR Am: >60 mL/min (ref >60)
GFR, Estimated: >60 mL/min (ref >60)
GLUCOSE: 92 mg/dL (ref 70–140)
Potassium: 4.2 mmol/L (ref 3.5–5.1)
SODIUM: 138 mmol/L (ref 136–145)
Total Bilirubin: 0.6 mg/dL (ref 0.2–1.2)
Total Protein: 7.7 g/dL (ref 6.4–8.3)

## 2017-12-29 LAB — CBC WITH DIFFERENTIAL (CANCER CENTER ONLY)
Basophils Absolute: 0 10*3/uL (ref 0.0–0.1)
Basophils Relative: 0 %
Eosinophils Absolute: 0.2 10*3/uL (ref 0.0–0.5)
Eosinophils Relative: 6 %
HCT: 48.8 % (ref 38.4–49.9)
HEMOGLOBIN: 16.8 g/dL (ref 13.0–17.1)
LYMPHS ABS: 1.4 10*3/uL (ref 0.9–3.3)
LYMPHS PCT: 33 %
MCH: 30.4 pg (ref 27.2–33.4)
MCHC: 34.4 g/dL (ref 32.0–36.0)
MCV: 88.4 fL (ref 79.3–98.0)
MONOS PCT: 10 %
Monocytes Absolute: 0.4 10*3/uL (ref 0.1–0.9)
NEUTROS ABS: 2.2 10*3/uL (ref 1.5–6.5)
Neutrophils Relative %: 51 %
Platelet Count: 165 10*3/uL (ref 140–400)
RBC: 5.52 MIL/uL (ref 4.20–5.82)
RDW: 13.8 % (ref 11.0–14.6)
WBC Count: 4.4 10*3/uL (ref 4.0–10.3)

## 2017-12-29 LAB — LACTATE DEHYDROGENASE: LDH: 145 U/L (ref 125–245)

## 2017-12-30 LAB — KAPPA/LAMBDA LIGHT CHAINS
KAPPA FREE LGHT CHN: 51.2 mg/L — AB (ref 3.3–19.4)
Kappa, lambda light chain ratio: 4.34 — ABNORMAL HIGH (ref 0.26–1.65)
LAMDA FREE LIGHT CHAINS: 11.8 mg/L (ref 5.7–26.3)

## 2017-12-30 LAB — PROTEIN ELECTROPHORESIS, SERUM
A/G Ratio: 1.3 (ref 0.7–1.7)
ALBUMIN ELP: 4 g/dL (ref 2.9–4.4)
ALPHA-1-GLOBULIN: 0.2 g/dL (ref 0.0–0.4)
Alpha-2-Globulin: 0.6 g/dL (ref 0.4–1.0)
Beta Globulin: 1.4 g/dL — ABNORMAL HIGH (ref 0.7–1.3)
GAMMA GLOBULIN: 0.8 g/dL (ref 0.4–1.8)
Globulin, Total: 3.1 g/dL (ref 2.2–3.9)
M-Spike, %: 0.5 g/dL — ABNORMAL HIGH
TOTAL PROTEIN ELP: 7.1 g/dL (ref 6.0–8.5)

## 2017-12-30 LAB — BETA 2 MICROGLOBULIN, SERUM: BETA 2 MICROGLOBULIN: 1.3 mg/L (ref 0.6–2.4)

## 2018-01-01 LAB — IMMUNOFIXATION ELECTROPHORESIS
IGA: 744 mg/dL — AB (ref 90–386)
IGG (IMMUNOGLOBIN G), SERUM: 1046 mg/dL (ref 700–1600)
IgM (Immunoglobulin M), Srm: 80 mg/dL (ref 20–172)
Total Protein ELP: 7.2 g/dL (ref 6.0–8.5)

## 2018-01-05 ENCOUNTER — Inpatient Hospital Stay (HOSPITAL_BASED_OUTPATIENT_CLINIC_OR_DEPARTMENT_OTHER): Payer: BLUE CROSS/BLUE SHIELD | Admitting: Hematology and Oncology

## 2018-01-05 ENCOUNTER — Telehealth: Payer: Self-pay | Admitting: Hematology and Oncology

## 2018-01-05 ENCOUNTER — Encounter: Payer: Self-pay | Admitting: Hematology and Oncology

## 2018-01-05 VITALS — BP 149/91 | HR 71 | Temp 98.7°F | Resp 17 | Ht 67.0 in | Wt 224.7 lb

## 2018-01-05 DIAGNOSIS — M549 Dorsalgia, unspecified: Secondary | ICD-10-CM

## 2018-01-05 DIAGNOSIS — D472 Monoclonal gammopathy: Secondary | ICD-10-CM

## 2018-01-05 DIAGNOSIS — G8929 Other chronic pain: Secondary | ICD-10-CM | POA: Diagnosis not present

## 2018-01-05 NOTE — Telephone Encounter (Signed)
Appointments scheduled AVS/Calendar printed per 5/20 los °

## 2018-01-18 NOTE — Progress Notes (Signed)
[name] Follow-up Visit:  Assessment: MGUS (monoclonal gammopathy of unknown significance) 57 y.o. male with nonspecific abnormality in the serum protein electrophoresis, potentially suggestive of monoclonal gammopathy of unknown significance.  Recent studies confirmed presence of monoclonal gammopathy with IgA kappa light chains with small amount of paraprotein in the peripheral blood.  Additional assessment revealed presence of a small monoclonal plasma cell population in the bone marrow measuring 5-10%.  Patient did not fulfill any of the CRAB criteria for diagnosis of multiple myeloma at that time.  Repeat lab work today demonstrates stable monoclonal protein levels without increase in the kappa to lambda ratio compared to the last visit in the clinic.  No interval changes in the creatinine, calcium, or hematological parameters.  No new musculoskeletal complaints to suggest significant disease progression.  Plan: --Continue observation --Return to clinic in 3 months for continued monitoring with labs obtained 1 week prior.  Repeat bone marrow biopsy and imaging will be obtained on the appearance of new symptoms or if the biochemical parameters worsened significantly. Voice recognition software was used and creation of this note. Despite my best effort at editing the text, some misspelling/errors may have occurred.  Orders Placed This Encounter  Procedures  . CBC with Differential ([name] Only)    Standing Status:   Future    Standing Expiration Date:   01/06/2019  . CMP ([facility] only)    Standing Status:   Future    Standing Expiration Date:   01/06/2019  . Lactate dehydrogenase (LDH)    Standing Status:   Future    Standing Expiration Date:   01/05/2019  . Beta 2 microglobulin, serum    Standing Status:   Future    Standing Expiration Date:   01/06/2019  . Multiple Myeloma Panel (SPEP&IFE w/QIG)    Standing Status:   Future    Standing Expiration  Date:   01/05/2019  . Kappa/lambda light chains    Standing Status:   Future    Standing Expiration Date:   01/05/2019    [name] Staging No matching staging information was found for the patient.  All questions were answered.  . The patient knows to call the clinic with any problems, questions or concerns.  This note was electronically signed.    History of Presenting Illness Anthony [name] is a 57 y.o. male followed in the [facility] for evaluation of abnormal serum protein referred by Dr. Renato Shin.  Please see hematological data available below.  At the present time, active complaints of the patient include chronic back pain for the past 20 years.  Patient does have no radiation of the pain in the lower extremities.  He denies any changes in sensation or strength in the distal extremities.  Patient has diagnosis of osteoporosis and underwent a bone scan which demonstrated 3 stress fractures in the vertebral bodies.  With that, additional evaluation was obtained demonstrating elevated IgA as outlined below.  Returns to the clinic to review of the recent lab work.  Denies any new symptoms since the last visit to the clinic.  Patient denies any symptoms from the respiratory, cardiovascular, genitourinary, or neurological systems.  No recent change in appetite or activity tolerance, no fevers, chills, night sweats.  Oncological/hematological History: **IgA kappa MGUS: --Labs, 04/17/17: SPEP -- abnormal protein band in the beta globulins and possible second abnormal band in the gamma globulins; IgG 782, IgA 653, IgM 68; --Labs, 07/03/17: tProt 7.4, Alb 4.3, Ca 9.6, Cr 1.2,  AP 55, LDH 147, beta-2 microglobulin ...; SPEP -- M-spike 0.2g/dL, SIFE -- monoclonal IgA kappa; IgG 722, IgA 565, IgM 60; kappa 45.3, lambda 10.8, KLR 4.19; WBC 3.0, Hgb 14.8, Plt 159;  --PET-CT, 07/22/18: No evidence of metabolically active malignancy, no evidence of lytic skeletal lesions. --BM Bx, 07/23/18:  Pathology -- normocellular bone marrow with monoclonal plasma cell population measuring 5-10% of total cellularity. FlowCyto --no aberrant monoclonal populations of lymphocytes, no elevation of blasts. CytoGen & FISH --no abnormal findings. --Labs, 10/27/17: tProt 7.4, Alb 4.1, Ca 9.5, Cr 1.0, AP 56, LDH    ..., beta-2 microglobulin   ..., SPEP -- M-spike 0.5g/dL, SIFE -- monoclonal IgA kappa; IgG   831, IgA 617, IgM 66; kappa 59.1, lambda 12.5, KLR 4.73; WBC 4.1, Hgb 16.6, Plt 167;   --Labs, 12/29/17: tProt 7.7, Alb 4.5, Ca 9.7, Cr 1.1, AP 57, LDH 145, beta-2 microglobulin 1.3, SPEP -- M-spike 0.5g/dL, SIFE -- monoclonal IgA kappa; IgG 1046, IgA 744, IgM 80; kappa 51.2, lambda 11.8, KLR 4.34; WBC 4.4, Hgb 16.8, Plt 165;   Medical History: Past Medical History:  Diagnosis Date  . Back pain     Surgical History: No past surgical history on file.  Family History: Family History  Problem Relation Age of Onset  . Rheum arthritis Mother   . Other Sister        Pre-cancerous uterine mass;   . Rheum arthritis Sister   . [name] Paternal Grandfather   . Brain [name] Maternal Uncle   . Brain [name] Maternal Aunt   . Osteoporosis Neg Hx     Social History: Social History   Socioeconomic History  . Marital status: Married    Spouse name: Not on file  . Number of children: Not on file  . Years of education: Not on file  . Highest education level: Not on file  Occupational History  . Not on file  Social Needs  . Financial resource strain: Not on file  . Food insecurity:    Worry: Not on file    Inability: Not on file  . Transportation needs:    Medical: Not on file    Non-medical: Not on file  Tobacco Use  . Smoking status: Never Smoker  . Smokeless tobacco: Former Systems developer    Types: Chew, Snuff  Substance and Sexual Activity  . Alcohol use: Yes    Alcohol/week: 1.2 oz    Types: 2 Glasses of wine per week  . Drug use: No  . Sexual activity: Yes  Lifestyle  . Physical  activity:    Days per week: Not on file    Minutes per session: Not on file  . Stress: Not on file  Relationships  . Social connections:    Talks on phone: Not on file    Gets together: Not on file    Attends religious service: Not on file    Active member of club or organization: Not on file    Attends meetings of clubs or organizations: Not on file    Relationship status: Not on file  . Intimate partner violence:    Fear of current or ex partner: Not on file    Emotionally abused: Not on file    Physically abused: Not on file    Forced sexual activity: Not on file  Other Topics Concern  . Not on file  Social History Narrative  . Not on file    Allergies: No Known Allergies  Medications:  Current Outpatient  Medications  Medication Sig Dispense Refill  . aluminum chloride (DRYSOL) 20 % external solution Apply 1 application topically at bedtime.    Marland Kitchen aspirin EC 81 MG tablet Take 81 mg by mouth daily.    . calcium-vitamin D (OSCAL WITH D) 250-125 MG-UNIT tablet Take 1 tablet by mouth daily.    . diclofenac sodium (VOLTAREN) 1 % GEL Apply 2 g topically as needed.    . ibandronate (BONIVA) 150 MG tablet Take 1 tablet (150 mg total) by mouth every 30 (thirty) days. Take in the morning with a full glass of water, on an empty stomach, and do not take anything else by mouth or lie down for the next 30 min. (Patient not taking: Reported on 01/05/2018) 3 tablet 3  . ibuprofen (ADVIL,MOTRIN) 200 MG tablet Take 200 mg by mouth every 6 (six) hours as needed for moderate pain.    . Multiple Vitamin (MULTIVITAMIN) tablet Take 1 tablet by mouth daily.    . naftifine (NAFTIN) 1 % cream Apply 1 application topically daily.    . Omega-3 Fatty Acids (FISH OIL) 1360 MG CAPS Take 1 capsule by mouth daily.    Marland Kitchen terbinafine (LAMISIL) 1 % cream Apply 1 application topically 2 (two) times daily.    Marland Kitchen triamcinolone (KENALOG) 0.025 % cream Apply 1 application topically 2 (two) times daily.     No current  facility-administered medications for this visit.     Review of Systems: Review of Systems  Musculoskeletal: Positive for back pain.  All other systems reviewed and are negative.    PHYSICAL EXAMINATION Blood pressure (!) 149/91, pulse 71, temperature 98.7 F (37.1 C), temperature source Oral, resp. rate 17, height 5' 7"  (1.702 m), weight 224 lb 11.2 oz (101.9 kg), SpO2 99 %.  ECOG PERFORMANCE STATUS: 0 - Asymptomatic  Physical Exam  Constitutional: He is oriented to person, place, and time and well-developed, well-nourished, and in no distress. No distress.  HENT:  Head: Normocephalic and atraumatic.  Mouth/Throat: Oropharynx is clear and moist. No oropharyngeal exudate.  Eyes: Pupils are equal, round, and reactive to light. Conjunctivae and EOM are normal. No scleral icterus.  Neck: No thyromegaly present.  Cardiovascular: Normal rate, regular rhythm and normal heart sounds.  No murmur heard. Pulmonary/Chest: Effort normal and breath sounds normal. No respiratory distress. He has no wheezes. He has no rales.  Abdominal: Soft. Bowel sounds are normal. He exhibits no distension. There is no tenderness. There is no rebound and no guarding.  Musculoskeletal: He exhibits no edema or deformity.  Lymphadenopathy:    He has no cervical adenopathy.  Neurological: He is alert and oriented to person, place, and time. He has normal reflexes. No cranial nerve deficit.  Skin: Skin is warm and dry. No rash noted. He is not diaphoretic. No erythema. No pallor.     LABORATORY DATA: I have personally reviewed the data as listed: No visits with results within 1 Week(s) from this visit.  Latest known visit with results is:  Appointment on 12/29/2017  Component Date Value Ref Range Status  . LDH 12/29/2017 145  125 - 245 U/L Final   Performed at [facility], [place] [address], [place], [place] [address]  . Kappa free light chain 12/29/2017 51.2* 3.3 - 19.4 mg/L Final  .  Lamda free light chains 12/29/2017 11.8  5.7 - 26.3 mg/L Final  . Kappa, lamda light chain ratio 12/29/2017 4.34* 0.26 - 1.65 Final   Comment: (NOTE) Performed At: [facility]  [facility] [place], [place] [id number] Rush Farmer MD [id number] Performed at [facility], [place] [address], [place], [place] [address]   . Sodium 12/29/2017 138  136 - 145 mmol/L Final  . Potassium 12/29/2017 4.2  3.5 - 5.1 mmol/L Final  . Chloride 12/29/2017 105  98 - 109 mmol/L Final  . CO2 12/29/2017 25  22 - 29 mmol/L Final  . Glucose, Bld 12/29/2017 92  70 - 140 mg/dL Final  . BUN 12/29/2017 17  7 - 26 mg/dL Final  . Creatinine 12/29/2017 1.12  0.70 - 1.30 mg/dL Final  . Calcium 12/29/2017 9.7  8.4 - 10.4 mg/dL Final  . Total Protein 12/29/2017 7.7  6.4 - 8.3 g/dL Final  . Albumin 12/29/2017 4.5  3.5 - 5.0 g/dL Final  . AST 12/29/2017 20  5 - 34 U/L Final  . ALT 12/29/2017 29  0 - 55 U/L Final  . Alkaline Phosphatase 12/29/2017 57  40 - 150 U/L Final  . Total Bilirubin 12/29/2017 0.6  0.2 - 1.2 mg/dL Final  . GFR, Est Non Af Am 12/29/2017 >60  >60 mL/min Final  . GFR, Est AFR Am 12/29/2017 >60  >60 mL/min Final   Comment: (NOTE) The eGFR has been calculated using the CKD EPI equation. This calculation has not been validated in all clinical situations. eGFR's persistently <60 mL/min signify possible Chronic Kidney Disease.   Georgiann Hahn gap 12/29/2017 8  3 - 11 Final   Performed at [facility], [place] [address] [place], [place] [address]  . Beta-2 Microglobulin 12/29/2017 1.3  0.6 - 2.4 mg/L Final   Comment: (NOTE) Siemens Immulite 2000 Immunochemiluminometric assay (ICMA) Values obtained with different assay methods or kits cannot be used interchangeably. Results cannot be interpreted as absolute evidence of the presence or absence of malignant disease. Performed At: [facility] [place], [place]  [id number] Rush Farmer MD [id number] Performed at [facility], [place] [address], [place], [place] [address]   . Total Protein ELP 12/29/2017 7.1  6.0 - 8.5 g/dL Final  . Albumin ELP 12/29/2017 4.0  2.9 - 4.4 g/dL Final  . Alpha-1-Globulin 12/29/2017 0.2  0.0 - 0.4 g/dL Final  . Alpha-2-Globulin 12/29/2017 0.6  0.4 - 1.0 g/dL Final  . Beta Globulin 12/29/2017 1.4* 0.7 - 1.3 g/dL Final  . Gamma Globulin 12/29/2017 0.8  0.4 - 1.8 g/dL Final  . M-Spike, % 12/29/2017 0.5* Not Observed g/dL Final  . SPE Interp. 12/29/2017 Comment   Final   Comment: (NOTE) The SPE pattern demonstrates a single peak (M-spike) in the beta region which may represent monoclonal protein. This peak may also be caused by the presence of fibrinogen or circulating immune complexes. If clinically indicated, the presence of a monoclonal gammopathy may be confirmed by immunofixation, as well as evaluation of the urine for the presence of Bence-Jones protein. Performed At: [facility] [place], [place] [id number] Rush Farmer MD [id number]   . Comment 12/29/2017 Comment   Final   Comment: (NOTE) Protein electrophoresis scan will follow via computer, mail, or courier delivery.   Marland Kitchen GLOBULIN, TOTAL 12/29/2017 3.1  2.2 - 3.9 g/dL Corrected  . A/G Ratio 12/29/2017 1.3  0.7 - 1.7 Corrected   Performed at [facility], [place] [address] [place], [place] [address]  . Total Protein ELP 12/29/2017 7.2  6.0 - 8.5 g/dL Final  . IgG (Immunoglobin G),  Serum 12/29/2017 1,046  700 - 1,600 mg/dL Final  . IgA 12/29/2017 744* 90 - 386 mg/dL Final  . IgM (Immunoglobulin M), Srm 12/29/2017 80  20 - 172 mg/dL Final   Comment: (NOTE) Performed At: [facility] [place], [place] [id number] Rush Farmer MD [id number]   . Immunofixation Result, Serum 12/29/2017 Comment   Corrected   Comment: (NOTE) Immunofixation shows IgA  monoclonal protein with kappa light chain specificity. Performed at [facility], [place] [address] [place], [place] [address]   . WBC Count 12/29/2017 4.4  4.0 - 10.3 K/uL Final  . RBC 12/29/2017 5.52  4.20 - 5.82 MIL/uL Final  . Hemoglobin 12/29/2017 16.8  13.0 - 17.1 g/dL Final  . HCT 12/29/2017 48.8  38.4 - 49.9 % Final  . MCV 12/29/2017 88.4  79.3 - 98.0 fL Final  . MCH 12/29/2017 30.4  27.2 - 33.4 pg Final  . MCHC 12/29/2017 34.4  32.0 - 36.0 g/dL Final  . RDW 12/29/2017 13.8  11.0 - 14.6 % Final  . Platelet Count 12/29/2017 165  140 - 400 K/uL Final  . Neutrophils Relative % 12/29/2017 51  % Final  . Neutro Abs 12/29/2017 2.2  1.5 - 6.5 K/uL Final  . Lymphocytes Relative 12/29/2017 33  % Final  . Lymphs Abs 12/29/2017 1.4  0.9 - 3.3 K/uL Final  . Monocytes Relative 12/29/2017 10  % Final  . Monocytes Absolute 12/29/2017 0.4  0.1 - 0.9 K/uL Final  . Eosinophils Relative 12/29/2017 6  % Final  . Eosinophils Absolute 12/29/2017 0.2  0.0 - 0.5 K/uL Final  . Basophils Relative 12/29/2017 0  % Final  . Basophils Absolute 12/29/2017 0.0  0.0 - 0.1 K/uL Final   Performed at [facility], [place] [address] [place],  [address]       Ardath Sax, MD

## 2018-01-18 NOTE — Assessment & Plan Note (Signed)
57 y.o. male with nonspecific abnormality in the serum protein electrophoresis, potentially suggestive of monoclonal gammopathy of unknown significance.  Recent studies confirmed presence of monoclonal gammopathy with IgA kappa light chains with small amount of paraprotein in the peripheral blood.  Additional assessment revealed presence of a small monoclonal plasma cell population in the bone marrow measuring 5-10%.  Patient did not fulfill any of the CRAB criteria for diagnosis of multiple myeloma at that time.  Repeat lab work today demonstrates stable monoclonal protein levels without increase in the kappa to lambda ratio compared to the last visit in the clinic.  No interval changes in the creatinine, calcium, or hematological parameters.  No new musculoskeletal complaints to suggest significant disease progression.  Plan: --Continue observation --Return to clinic in 3 months for continued monitoring with labs obtained 1 week prior.  Repeat bone marrow biopsy and imaging will be obtained on the appearance of new symptoms or if the biochemical parameters worsened significantly.

## 2018-01-20 DIAGNOSIS — Z23 Encounter for immunization: Secondary | ICD-10-CM | POA: Diagnosis not present

## 2018-02-04 DIAGNOSIS — Z0000 Encounter for general adult medical examination without abnormal findings: Secondary | ICD-10-CM | POA: Diagnosis not present

## 2018-02-04 DIAGNOSIS — Z13220 Encounter for screening for lipoid disorders: Secondary | ICD-10-CM | POA: Diagnosis not present

## 2018-02-04 DIAGNOSIS — R972 Elevated prostate specific antigen [PSA]: Secondary | ICD-10-CM | POA: Diagnosis not present

## 2018-02-04 DIAGNOSIS — B351 Tinea unguium: Secondary | ICD-10-CM | POA: Diagnosis not present

## 2018-02-04 DIAGNOSIS — M549 Dorsalgia, unspecified: Secondary | ICD-10-CM | POA: Diagnosis not present

## 2018-02-04 DIAGNOSIS — D472 Monoclonal gammopathy: Secondary | ICD-10-CM | POA: Diagnosis not present

## 2018-02-09 DIAGNOSIS — H52223 Regular astigmatism, bilateral: Secondary | ICD-10-CM | POA: Diagnosis not present

## 2018-02-09 DIAGNOSIS — H524 Presbyopia: Secondary | ICD-10-CM | POA: Diagnosis not present

## 2018-02-09 DIAGNOSIS — H5213 Myopia, bilateral: Secondary | ICD-10-CM | POA: Diagnosis not present

## 2018-02-11 DIAGNOSIS — M545 Low back pain: Secondary | ICD-10-CM | POA: Diagnosis not present

## 2018-02-18 DIAGNOSIS — M545 Low back pain: Secondary | ICD-10-CM | POA: Diagnosis not present

## 2018-02-24 DIAGNOSIS — M545 Low back pain: Secondary | ICD-10-CM | POA: Diagnosis not present

## 2018-03-03 DIAGNOSIS — M545 Low back pain: Secondary | ICD-10-CM | POA: Diagnosis not present

## 2018-03-24 DIAGNOSIS — M545 Low back pain: Secondary | ICD-10-CM | POA: Diagnosis not present

## 2018-04-01 ENCOUNTER — Inpatient Hospital Stay: Payer: BLUE CROSS/BLUE SHIELD | Attending: Hematology and Oncology

## 2018-04-01 DIAGNOSIS — Z7982 Long term (current) use of aspirin: Secondary | ICD-10-CM | POA: Insufficient documentation

## 2018-04-01 DIAGNOSIS — D472 Monoclonal gammopathy: Secondary | ICD-10-CM | POA: Diagnosis not present

## 2018-04-01 LAB — CMP (CANCER CENTER ONLY)
ALBUMIN: 4.2 g/dL (ref 3.5–5.0)
ALT: 25 U/L (ref 0–44)
ANION GAP: 11 (ref 5–15)
AST: 16 U/L (ref 15–41)
Alkaline Phosphatase: 65 U/L (ref 38–126)
BUN: 14 mg/dL (ref 6–20)
CO2: 22 mmol/L (ref 22–32)
Calcium: 9.3 mg/dL (ref 8.9–10.3)
Chloride: 107 mmol/L (ref 98–111)
Creatinine: 0.9 mg/dL (ref 0.61–1.24)
GFR, Est AFR Am: >60 mL/min (ref >60)
GFR, Estimated: >60 mL/min (ref >60)
GLUCOSE: 91 mg/dL (ref 70–99)
POTASSIUM: 4.3 mmol/L (ref 3.5–5.1)
SODIUM: 140 mmol/L (ref 135–145)
Total Bilirubin: 0.5 mg/dL (ref 0.3–1.2)
Total Protein: 7.2 g/dL (ref 6.5–8.1)

## 2018-04-01 LAB — CBC WITH DIFFERENTIAL (CANCER CENTER ONLY)
BASOS ABS: 0 10*3/uL (ref 0.0–0.1)
Basophils Relative: 1 %
EOS PCT: 6 %
Eosinophils Absolute: 0.3 10*3/uL (ref 0.0–0.5)
HEMATOCRIT: 47 % (ref 38.4–49.9)
Hemoglobin: 15.8 g/dL (ref 13.0–17.1)
LYMPHS ABS: 1.4 10*3/uL (ref 0.9–3.3)
LYMPHS PCT: 31 %
MCH: 29.9 pg (ref 27.2–33.4)
MCHC: 33.5 g/dL (ref 32.0–36.0)
MCV: 89.2 fL (ref 79.3–98.0)
MONO ABS: 0.5 10*3/uL (ref 0.1–0.9)
Monocytes Relative: 10 %
NEUTROS ABS: 2.4 10*3/uL (ref 1.5–6.5)
Neutrophils Relative %: 52 %
PLATELETS: 189 10*3/uL (ref 140–400)
RBC: 5.27 MIL/uL (ref 4.20–5.82)
RDW: 13.6 % (ref 11.0–14.6)
WBC Count: 4.6 10*3/uL (ref 4.0–10.3)

## 2018-04-01 LAB — LACTATE DEHYDROGENASE: LDH: 138 U/L (ref 98–192)

## 2018-04-02 LAB — BETA 2 MICROGLOBULIN, SERUM: Beta-2 Microglobulin: 1.5 mg/L (ref 0.6–2.4)

## 2018-04-02 LAB — KAPPA/LAMBDA LIGHT CHAINS
KAPPA FREE LGHT CHN: 49.5 mg/L — AB (ref 3.3–19.4)
KAPPA, LAMDA LIGHT CHAIN RATIO: 4.34 — AB (ref 0.26–1.65)
LAMDA FREE LIGHT CHAINS: 11.4 mg/L (ref 5.7–26.3)

## 2018-04-03 LAB — MULTIPLE MYELOMA PANEL, SERUM
Albumin SerPl Elph-Mcnc: 4 g/dL (ref 2.9–4.4)
Albumin/Glob SerPl: 1.5 (ref 0.7–1.7)
Alpha 1: 0.2 g/dL (ref 0.0–0.4)
Alpha2 Glob SerPl Elph-Mcnc: 0.5 g/dL (ref 0.4–1.0)
B-GLOBULIN SERPL ELPH-MCNC: 1.3 g/dL (ref 0.7–1.3)
Gamma Glob SerPl Elph-Mcnc: 0.7 g/dL (ref 0.4–1.8)
Globulin, Total: 2.7 g/dL (ref 2.2–3.9)
IGA: 578 mg/dL — AB (ref 90–386)
IGM (IMMUNOGLOBULIN M), SRM: 51 mg/dL (ref 20–172)
IgG (Immunoglobin G), Serum: 811 mg/dL (ref 700–1600)
M Protein SerPl Elph-Mcnc: 0.4 g/dL — ABNORMAL HIGH
TOTAL PROTEIN ELP: 6.7 g/dL (ref 6.0–8.5)

## 2018-04-06 DIAGNOSIS — M545 Low back pain: Secondary | ICD-10-CM | POA: Diagnosis not present

## 2018-04-07 ENCOUNTER — Telehealth: Payer: Self-pay | Admitting: Hematology and Oncology

## 2018-04-07 ENCOUNTER — Inpatient Hospital Stay (HOSPITAL_BASED_OUTPATIENT_CLINIC_OR_DEPARTMENT_OTHER): Payer: BLUE CROSS/BLUE SHIELD | Admitting: Hematology and Oncology

## 2018-04-07 DIAGNOSIS — Z7982 Long term (current) use of aspirin: Secondary | ICD-10-CM | POA: Diagnosis not present

## 2018-04-07 DIAGNOSIS — D472 Monoclonal gammopathy: Secondary | ICD-10-CM | POA: Diagnosis not present

## 2018-04-07 NOTE — Assessment & Plan Note (Signed)
IgA kappa MGUS: Diagnosed 04/17/2017 07/03/17: tProt 7.4, Alb 4.3, Ca 9.6, Cr 1.2, AP 55, LDH 147, beta-2 microglobulin ...; SPEP -- M-spike 0.2g/dL, SIFE -- monoclonal IgA kappa; IgG 722, IgA 565, IgM 60; kappa 45.3, lambda 10.8, KLR 4.19; WBC 3.0, Hgb 14.8, Plt 159;   PET-CT, 07/22/18: No evidence of metabolically active malignancy, no evidence of lytic skeletal lesions.  BM Bx, 07/23/18: Pathology -- normocellular bone marrow with monoclonal plasma cell population measuring 5-10% of total cellularity. FlowCyto --no aberrant monoclonal populations of lymphocytes, no elevation of blasts. CytoGen & FISH --no abnormal findings.  Lab review 04/01/2018: M protein 0.4 g, IgA 578, kappa 49.5, KAL ratio 4.34, beta-2 microglobulin 1.5 CBC CMP within normal limits  I discussed with the patient the results of recently performed lab work.  There are no signs or symptoms of myeloma.  The M protein is fluctuating from originally 0.2 went up to 0.5 but now it is 0.4.  Recommendation: Continue to watch and monitor every 6 months with labs.

## 2018-04-07 NOTE — Telephone Encounter (Signed)
Gave avs and calendar ° °

## 2018-04-07 NOTE — Progress Notes (Signed)
Patient Care Team: Cleda Mccreedy as PCP - General (Nurse Practitioner)  DIAGNOSIS:  Encounter Diagnosis  Name Primary?  Marland Kitchen MGUS (monoclonal gammopathy of unknown significance)      CHIEF COMPLIANT: Follow-up of MGUS  INTERVAL HISTORY: Anthony [name] is a 57 year old with above-mentioned history of MGUS was seen by Dr. Lebron Conners and was sent to me since he left our practice.  Patient had worked up extensively for MGUS.  He had a biopsy which did not show any myeloma.  His MGUS appears to be stable.  M protein is declined from 0.5 to 0.4 g.  He denies any myeloma related symptoms.  REVIEW OF SYSTEMS:   Constitutional: Denies fevers, chills or abnormal weight loss Eyes: Denies blurriness of vision Ears, nose, mouth, throat, and face: Denies mucositis or sore throat Respiratory: Denies cough, dyspnea or wheezes Cardiovascular: Denies palpitation, chest discomfort Gastrointestinal:  Denies nausea, heartburn or change in bowel habits Skin: Denies abnormal skin rashes Lymphatics: Denies new lymphadenopathy or easy bruising Neurological:Denies numbness, tingling or new weaknesses Behavioral/Psych: Mood is stable, no new changes  Extremities: No lower extremity edema   All other systems were reviewed with the patient and are negative.  I have reviewed the past medical history, past surgical history, social history and family history with the patient and they are unchanged from previous note.  ALLERGIES:  has No Known Allergies.  MEDICATIONS:  Current Outpatient Medications  Medication Sig Dispense Refill  . aluminum chloride (DRYSOL) 20 % external solution Apply 1 application topically at bedtime.    Marland Kitchen aspirin EC 81 MG tablet Take 81 mg by mouth daily.    . calcium-vitamin D (OSCAL WITH D) 250-125 MG-UNIT tablet Take 1 tablet by mouth daily.    . diclofenac sodium (VOLTAREN) 1 % GEL Apply 2 g topically as needed.    Marland Kitchen ibuprofen (ADVIL,MOTRIN) 200 MG tablet Take 200 mg by mouth  every 6 (six) hours as needed for moderate pain.    . Multiple Vitamin (MULTIVITAMIN) tablet Take 1 tablet by mouth daily.    . naftifine (NAFTIN) 1 % cream Apply 1 application topically daily.    . Omega-3 Fatty Acids (FISH OIL) 1360 MG CAPS Take 1 capsule by mouth daily.    Marland Kitchen terbinafine (LAMISIL) 1 % cream Apply 1 application topically 2 (two) times daily.    Marland Kitchen triamcinolone (KENALOG) 0.025 % cream Apply 1 application topically 2 (two) times daily.     No current facility-administered medications for this visit.     PHYSICAL EXAMINATION: ECOG PERFORMANCE STATUS: 1 - Symptomatic but completely ambulatory  Vitals:   04/07/18 0813  BP: 133/78  Pulse: 72  Resp: 18  Temp: 98.8 F (37.1 C)  SpO2: 99%   Filed Weights   04/07/18 0813  Weight: 218 lb 1.6 oz (98.9 kg)    GENERAL:alert, no distress and comfortable SKIN: skin color, texture, turgor are normal, no rashes or significant lesions EYES: normal, Conjunctiva are pink and non-injected, sclera clear OROPHARYNX:no exudate, no erythema and lips, buccal mucosa, and tongue normal  NECK: supple, thyroid normal size, non-tender, without nodularity LYMPH:  no palpable lymphadenopathy in the cervical, axillary or inguinal LUNGS: clear to auscultation and percussion with normal breathing effort HEART: regular rate & rhythm and no murmurs and no lower extremity edema ABDOMEN:abdomen soft, non-tender and normal bowel sounds MUSCULOSKELETAL:no cyanosis of digits and no clubbing  NEURO: alert & oriented x 3 with fluent speech, no focal motor/sensory deficits EXTREMITIES: No lower extremity edema  LABORATORY DATA:  I have reviewed the data as listed CMP Latest Ref Rng & Units 04/01/2018 12/29/2017 10/27/2017  Glucose 70 - 99 mg/dL 91 92 97  BUN 6 - 20 mg/dL _0 Creatinine 0.61 - 1.24 mg/dL 0.90 1.12 0.97  Sodium 135 - 145 mmol/L 140 138 139  Potassium 3.5 - 5.1 mmol/L 4.3 4.2 4.5  Chloride 98 - 111 mmol/L 107 105 108  CO2 22 -  32 mmol/L _1 Calcium 8.9 - 10.3 mg/dL 9.3 9.7 9.5  Total Protein 6.5 - 8.1 g/dL 7.2 7.7 7.4  Total Bilirubin 0.3 - 1.2 mg/dL 0.5 0.6 0.5  Alkaline Phos 38 - 126 U/L 65 57 56  AST 15 - 41 U/L _2 ALT 0 - 44 U/L _3 Lab Results  Component Value Date   WBC 4.6 04/01/2018   HGB 15.8 04/01/2018   HCT 47.0 04/01/2018   MCV 89.2 04/01/2018   PLT 189 04/01/2018   NEUTROABS 2.4 04/01/2018    ASSESSMENT & PLAN:  MGUS (monoclonal gammopathy of unknown significance) IgA kappa MGUS: Diagnosed 04/17/2017 07/03/17: tProt 7.4, Alb 4.3, Ca 9.6, Cr 1.2, AP 55, LDH 147, beta-2 microglobulin ...; SPEP -- M-spike 0.2g/dL, SIFE -- monoclonal IgA kappa; IgG 722, IgA 565, IgM 60; kappa 45.3, lambda 10.8, KLR 4.19; WBC 3.0, Hgb 14.8, Plt 159;   PET-CT, 07/22/18: No evidence of metabolically active malignancy, no evidence of lytic skeletal lesions.  BM Bx, 07/23/18: Pathology -- normocellular bone marrow with monoclonal plasma cell population measuring 5-10% of total cellularity. FlowCyto --no aberrant monoclonal populations of lymphocytes, no elevation of blasts. CytoGen & FISH --no abnormal findings.  Lab review 04/01/2018: M protein 0.4 g, IgA 578, kappa 49.5, KAL ratio 4.34, beta-2 microglobulin 1.5 CBC CMP within normal limits  I discussed with the patient the results of recently performed lab work.  There are no signs or symptoms of myeloma.  The M protein is fluctuating from originally 0.2 went up to 0.5 but now it is 0.4.  Recommendation: Continue to watch and monitor every 6 months with labs.    Orders Placed This Encounter  Procedures  . CBC with Differential ([facility] Only)    Standing Status:   Future    Standing Expiration Date:   04/08/2019  . CMP ([facility] only)    Standing Status:   Future    Standing Expiration Date:   04/08/2019  . Multiple Myeloma Panel (SPEP&IFE w/QIG)    Standing Status:   Future    Standing Expiration Date:   05/12/2019  .  Kappa/lambda light chains    Standing Status:   Future    Standing Expiration Date:   05/12/2019  . Beta 2 microglobulin, serum    Standing Status:   Future    Standing Expiration Date:   05/12/2019   The patient has a good understanding of the overall plan. he agrees with it. he will call with any problems that may develop before the next visit here.   Harriette Ohara, MD 04/07/18

## 2018-04-14 DIAGNOSIS — M545 Low back pain: Secondary | ICD-10-CM | POA: Diagnosis not present

## 2018-04-21 DIAGNOSIS — M545 Low back pain: Secondary | ICD-10-CM | POA: Diagnosis not present

## 2018-04-28 DIAGNOSIS — M545 Low back pain: Secondary | ICD-10-CM | POA: Diagnosis not present

## 2018-05-04 DIAGNOSIS — M545 Low back pain: Secondary | ICD-10-CM | POA: Diagnosis not present

## 2018-05-11 DIAGNOSIS — M545 Low back pain: Secondary | ICD-10-CM | POA: Diagnosis not present

## 2018-05-19 DIAGNOSIS — M545 Low back pain: Secondary | ICD-10-CM | POA: Diagnosis not present

## 2018-05-25 DIAGNOSIS — M545 Low back pain: Secondary | ICD-10-CM | POA: Diagnosis not present

## 2018-06-02 DIAGNOSIS — M545 Low back pain: Secondary | ICD-10-CM | POA: Diagnosis not present

## 2018-06-12 DIAGNOSIS — M545 Low back pain: Secondary | ICD-10-CM | POA: Diagnosis not present

## 2018-06-15 DIAGNOSIS — M545 Low back pain: Secondary | ICD-10-CM | POA: Diagnosis not present

## 2018-06-22 DIAGNOSIS — M545 Low back pain: Secondary | ICD-10-CM | POA: Diagnosis not present

## 2018-07-01 DIAGNOSIS — R972 Elevated prostate specific antigen [PSA]: Secondary | ICD-10-CM | POA: Diagnosis not present

## 2018-07-03 DIAGNOSIS — M545 Low back pain: Secondary | ICD-10-CM | POA: Diagnosis not present

## 2018-07-07 DIAGNOSIS — M545 Low back pain: Secondary | ICD-10-CM | POA: Diagnosis not present

## 2018-07-08 DIAGNOSIS — R972 Elevated prostate specific antigen [PSA]: Secondary | ICD-10-CM | POA: Diagnosis not present

## 2018-07-10 DIAGNOSIS — M545 Low back pain: Secondary | ICD-10-CM | POA: Diagnosis not present

## 2018-07-21 DIAGNOSIS — M545 Low back pain: Secondary | ICD-10-CM | POA: Diagnosis not present

## 2018-07-24 DIAGNOSIS — M545 Low back pain: Secondary | ICD-10-CM | POA: Diagnosis not present

## 2018-07-27 DIAGNOSIS — M545 Low back pain: Secondary | ICD-10-CM | POA: Diagnosis not present

## 2018-08-04 DIAGNOSIS — M545 Low back pain: Secondary | ICD-10-CM | POA: Diagnosis not present

## 2018-08-07 DIAGNOSIS — M545 Low back pain: Secondary | ICD-10-CM | POA: Diagnosis not present

## 2018-09-04 DIAGNOSIS — L718 Other rosacea: Secondary | ICD-10-CM | POA: Diagnosis not present

## 2018-09-04 DIAGNOSIS — L821 Other seborrheic keratosis: Secondary | ICD-10-CM | POA: Diagnosis not present

## 2018-09-30 ENCOUNTER — Inpatient Hospital Stay: Payer: BLUE CROSS/BLUE SHIELD | Attending: Hematology and Oncology

## 2018-09-30 DIAGNOSIS — D472 Monoclonal gammopathy: Secondary | ICD-10-CM

## 2018-09-30 LAB — CMP (CANCER CENTER ONLY)
ALK PHOS: 56 U/L (ref 38–126)
ALT: 58 U/L — ABNORMAL HIGH (ref 0–44)
ANION GAP: 10 (ref 5–15)
AST: 36 U/L (ref 15–41)
Albumin: 4.2 g/dL (ref 3.5–5.0)
BILIRUBIN TOTAL: 0.6 mg/dL (ref 0.3–1.2)
BUN: 18 mg/dL (ref 6–20)
CALCIUM: 9.3 mg/dL (ref 8.9–10.3)
CO2: 23 mmol/L (ref 22–32)
Chloride: 105 mmol/L (ref 98–111)
Creatinine: 0.98 mg/dL (ref 0.61–1.24)
GFR, Est AFR Am: >60 mL/min (ref >60)
Glucose, Bld: 105 mg/dL — ABNORMAL HIGH (ref 70–99)
POTASSIUM: 4.2 mmol/L (ref 3.5–5.1)
Sodium: 138 mmol/L (ref 135–145)
TOTAL PROTEIN: 7.3 g/dL (ref 6.5–8.1)

## 2018-09-30 LAB — CBC WITH DIFFERENTIAL (CANCER CENTER ONLY)
Abs Immature Granulocytes: 0.01 10*3/uL (ref 0.00–0.07)
Basophils Absolute: 0 10*3/uL (ref 0.0–0.1)
Basophils Relative: 0 %
EOS PCT: 7 %
Eosinophils Absolute: 0.3 10*3/uL (ref 0.0–0.5)
HEMATOCRIT: 47.4 % (ref 39.0–52.0)
Hemoglobin: 15.9 g/dL (ref 13.0–17.0)
Immature Granulocytes: 0 %
LYMPHS ABS: 1.5 10*3/uL (ref 0.7–4.0)
LYMPHS PCT: 32 %
MCH: 29.8 pg (ref 26.0–34.0)
MCHC: 33.5 g/dL (ref 30.0–36.0)
MCV: 88.9 fL (ref 80.0–100.0)
MONO ABS: 0.4 10*3/uL (ref 0.1–1.0)
MONOS PCT: 9 %
NRBC: 0 % (ref 0.0–0.2)
Neutro Abs: 2.4 10*3/uL (ref 1.7–7.7)
Neutrophils Relative %: 52 %
Platelet Count: 189 10*3/uL (ref 150–400)
RBC: 5.33 MIL/uL (ref 4.22–5.81)
RDW: 13 % (ref 11.5–15.5)
WBC Count: 4.7 10*3/uL (ref 4.0–10.5)

## 2018-10-01 ENCOUNTER — Other Ambulatory Visit: Payer: BLUE CROSS/BLUE SHIELD

## 2018-10-01 LAB — BETA 2 MICROGLOBULIN, SERUM: Beta-2 Microglobulin: 1.7 mg/L (ref 0.6–2.4)

## 2018-10-01 LAB — KAPPA/LAMBDA LIGHT CHAINS
Kappa free light chain: 46.8 mg/L — ABNORMAL HIGH (ref 3.3–19.4)
Kappa, lambda light chain ratio: 4.29 — ABNORMAL HIGH (ref 0.26–1.65)
Lambda free light chains: 10.9 mg/L (ref 5.7–26.3)

## 2018-10-02 LAB — MULTIPLE MYELOMA PANEL, SERUM
ALBUMIN SERPL ELPH-MCNC: 4 g/dL (ref 2.9–4.4)
ALPHA 1: 0.2 g/dL (ref 0.0–0.4)
Albumin/Glob SerPl: 1.5 (ref 0.7–1.7)
Alpha2 Glob SerPl Elph-Mcnc: 0.5 g/dL (ref 0.4–1.0)
B-Globulin SerPl Elph-Mcnc: 1.3 g/dL (ref 0.7–1.3)
Gamma Glob SerPl Elph-Mcnc: 0.6 g/dL (ref 0.4–1.8)
Globulin, Total: 2.7 g/dL (ref 2.2–3.9)
IGA: 584 mg/dL — AB (ref 90–386)
IgG (Immunoglobin G), Serum: 797 mg/dL (ref 700–1600)
IgM (Immunoglobulin M), Srm: 59 mg/dL (ref 20–172)
M Protein SerPl Elph-Mcnc: 0.5 g/dL — ABNORMAL HIGH
Total Protein ELP: 6.7 g/dL (ref 6.0–8.5)

## 2018-10-06 ENCOUNTER — Telehealth: Payer: Self-pay | Admitting: Hematology and Oncology

## 2018-10-06 DIAGNOSIS — J069 Acute upper respiratory infection, unspecified: Secondary | ICD-10-CM | POA: Diagnosis not present

## 2018-10-06 NOTE — Telephone Encounter (Signed)
Called patient per 2/18 sch message - unable to reach patient - left message for patient to call back and r/s

## 2018-10-08 ENCOUNTER — Inpatient Hospital Stay: Payer: BLUE CROSS/BLUE SHIELD | Admitting: Hematology and Oncology

## 2018-10-08 NOTE — Assessment & Plan Note (Signed)
IgA kappa MGUS: Diagnosed 04/17/2017 07/03/17: tProt 7.4, Alb 4.3, Ca 9.6, Cr 1.2, AP 55, LDH 147, beta-2 microglobulin ...; SPEP -- M-spike 0.2g/dL, SIFE -- monoclonal IgA kappa; IgG 722, IgA 565, IgM 60; kappa 45.3, lambda 10.8, KLR 4.19; WBC 3.0, Hgb 14.8, Plt 159;   PET-CT, 07/22/18: No evidence of metabolically active malignancy, no evidence of lytic skeletal lesions.  BM Bx, 07/23/18: Pathology -- normocellular bone marrow with monoclonal plasma cell population measuring 5-10% of total cellularity. FlowCyto --no aberrant monoclonal populations of lymphocytes, no elevation of blasts. CytoGen & FISH --no abnormal findings.  Lab review 09/30/18: Hb 15.9, M protein: 0.5 gm (was 0.4 gm) Kappa 46.8, lambda 10.9, ratio 4.29 was previously 4.34 Beta 2 microglobulin: 1.7 I discussed with the patient the results of recently performed lab work.  There are no signs or symptoms of myeloma.  The M protein is fluctuating from originally 0.2 went up to 0.5 but now it is 0.4.  Recommendation: Continue to watch and monitor every 6 months with labs.

## 2018-10-12 ENCOUNTER — Encounter: Payer: Self-pay | Admitting: Hematology and Oncology

## 2018-10-14 ENCOUNTER — Telehealth: Payer: Self-pay | Admitting: Hematology and Oncology

## 2018-10-14 NOTE — Telephone Encounter (Signed)
Called patient to try to r/s appt from 2/20 - no answer - left message.

## 2018-10-18 IMAGING — CT CT BIOPSY
1 of 2 series · 15 of 29 positions shown, 19 images · non-contrast
Comparison: none

INDICATION: Possible multiple myeloma and back pain

[Series 2: i-spiral 5.0 b40f · axial · 0.80mm/px · z∈[-61,+9]mm · 15 of 24 slices shown, 19 images]
[im 2/24  mediastinal]
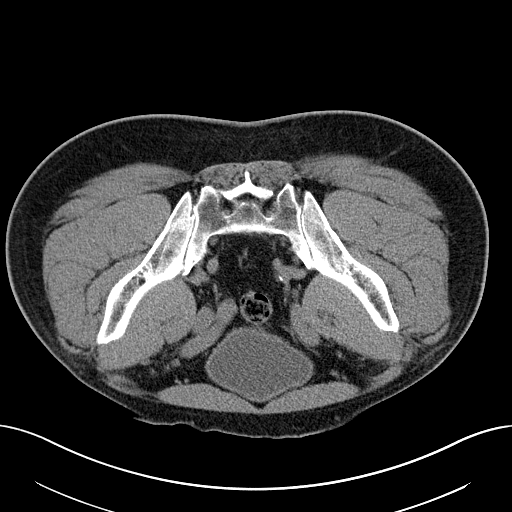
[im 2/24  lung]
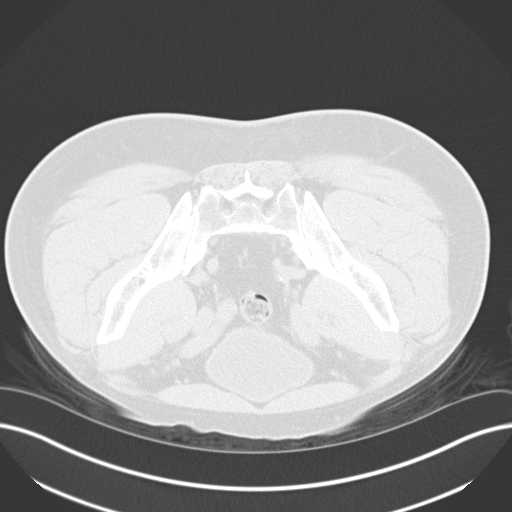
[im 3/24  lung]
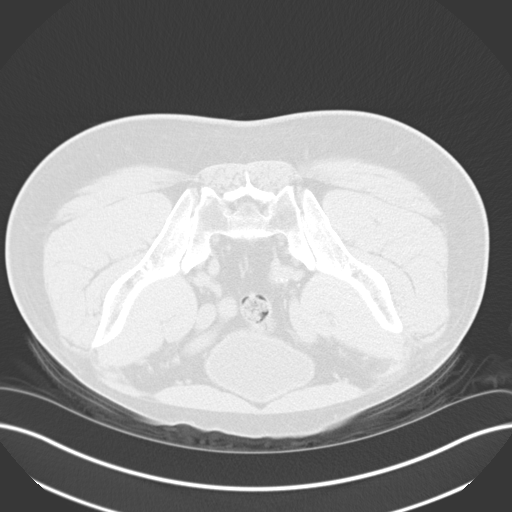
[im 6/24  lung]
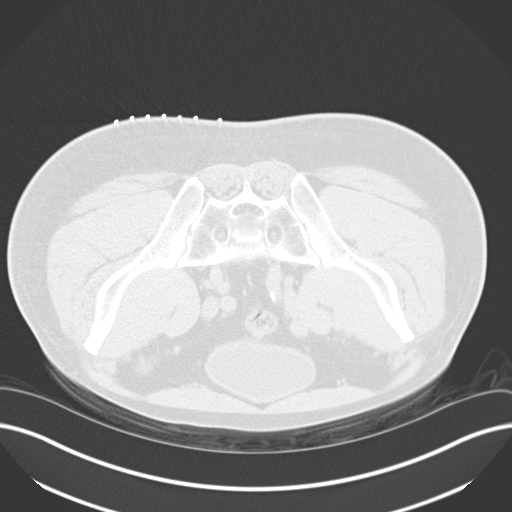
[im 7/24  lung]
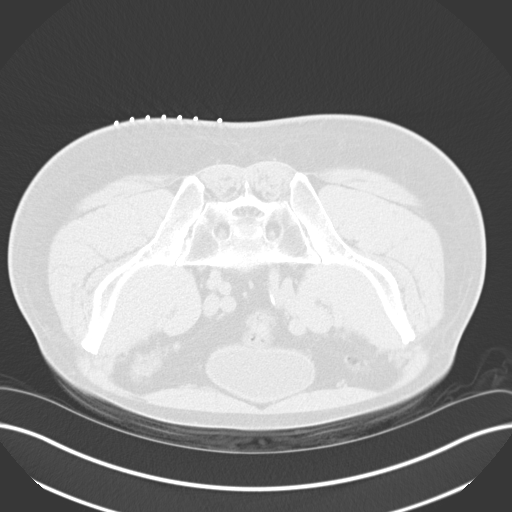
[im 8/24  mediastinal]
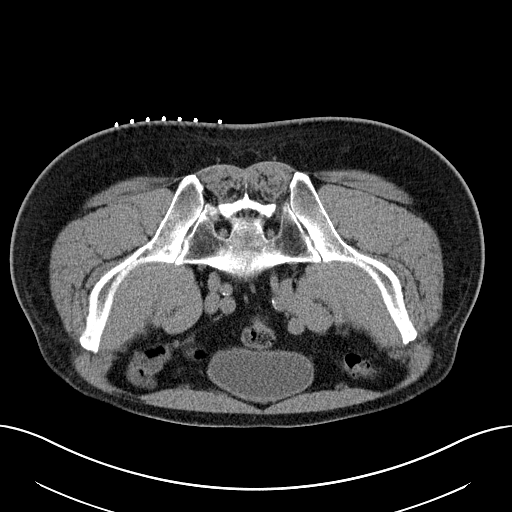
[im 8/24  lung]
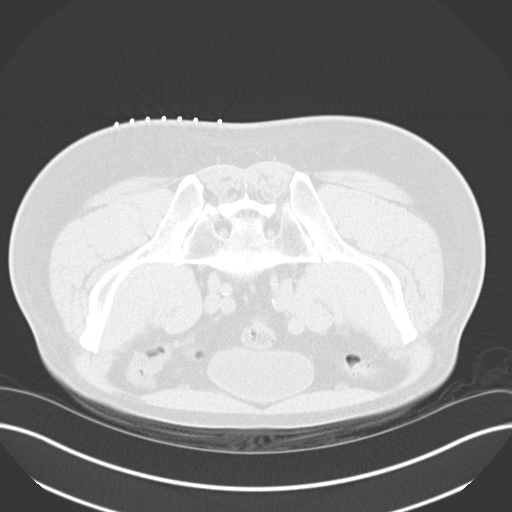
[im 9/24  lung]
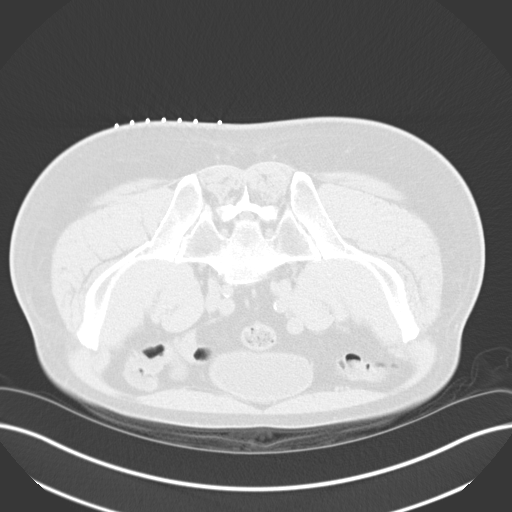
[im 10/24  lung]
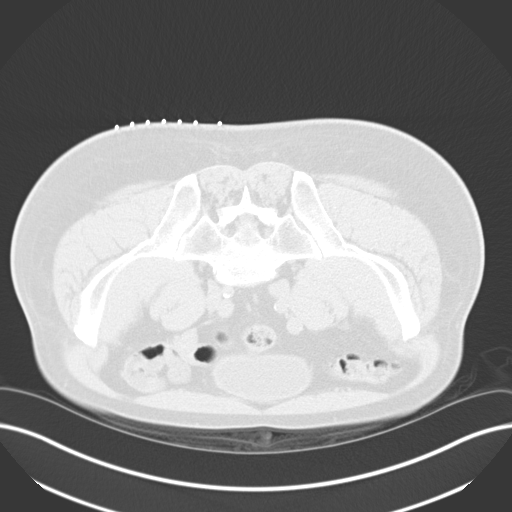
[im 12/24  lung]
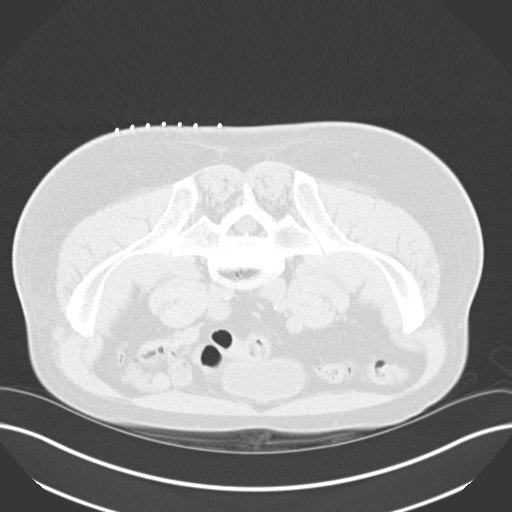
[im 13/24  mediastinal]
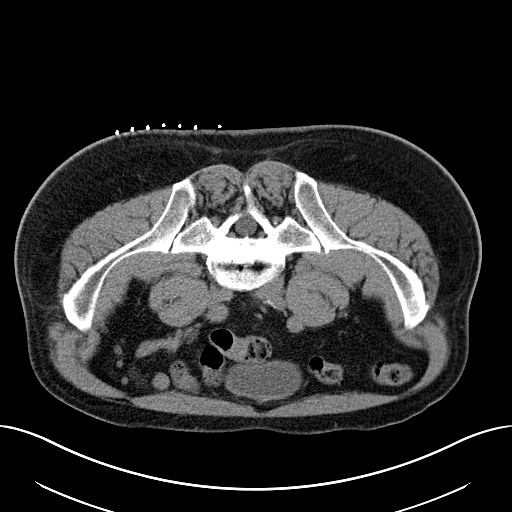
[im 13/24  lung]
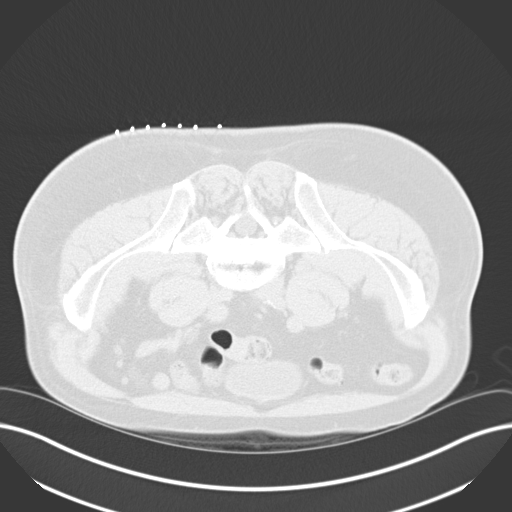
[im 15/24  lung]
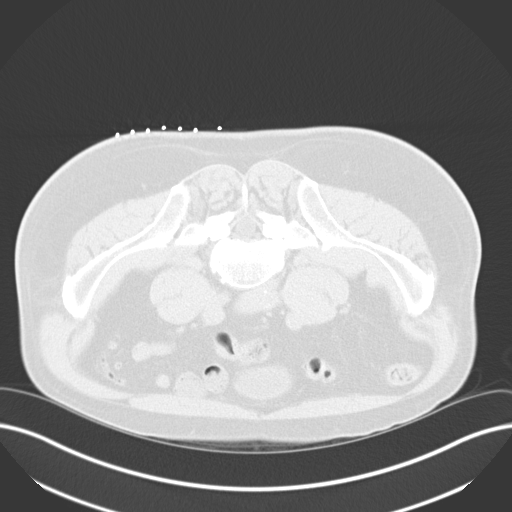
[im 16/24  lung]
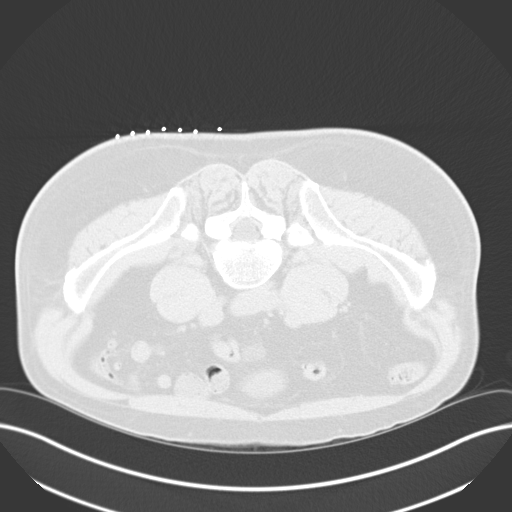
[im 17/24  lung]
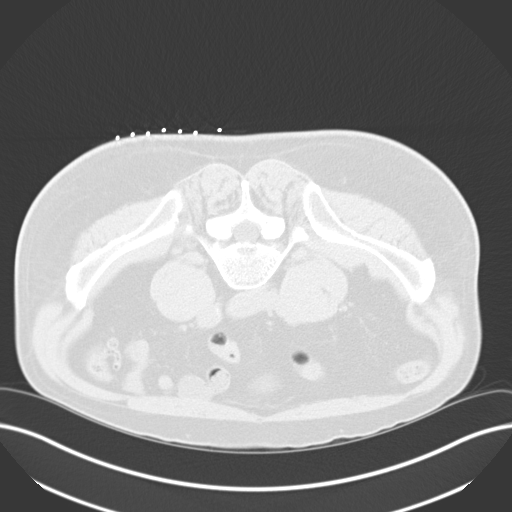
[im 20/24  mediastinal]
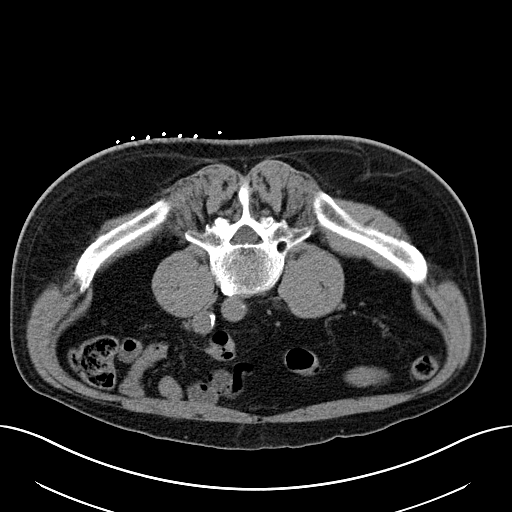
[im 20/24  lung]
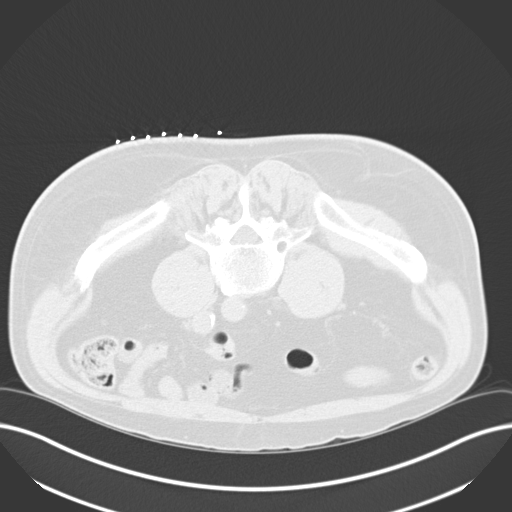
[im 21/24  lung]
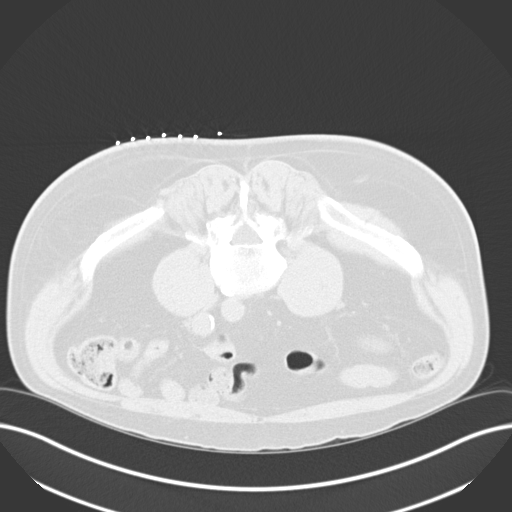
[im 22/24  lung]
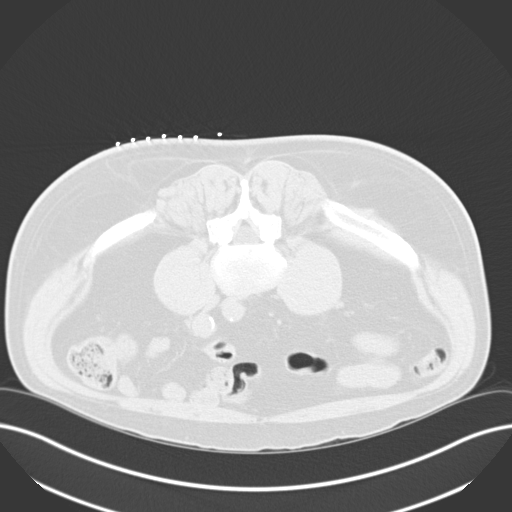

[15 of 29 positions shown; findings below may reference images not displayed]

EXAM:
CT BIOPSY; CT BONE MARROW BIOPSY AND ASPIRATION

MEDICATIONS:
None.

ANESTHESIA/SEDATION:
Fentanyl 100 mcg IV; Versed 2 mg IV

Moderate Sedation Time:  10 minutes

The patient was continuously monitored during the procedure by the
interventional radiology nurse under my direct supervision.

FLUOROSCOPY TIME:  None

COMPLICATIONS:
None immediate.

PROCEDURE:
Informed written consent was obtained from the patient after a
thorough discussion of the procedural risks, benefits and
alternatives. All questions were addressed. Maximal Sterile Barrier
Technique was utilized including caps, mask, sterile gowns, sterile
gloves, sterile drape, hand hygiene and skin antiseptic. A timeout
was performed prior to the initiation of the procedure.

Under CT guidance, an 11 gauge needle was advanced into the right
iliac bone. Aspirates and a core were obtained. Post biopsy images
demonstrate no hemorrhage.

Patient tolerated the procedure well without complication. Vital
sign monitoring by nursing staff during the procedure will continue
as patient is in the special procedures unit for post procedure
observation.
FINDINGS: The images document guide needle placement within the right iliac
bone. Post biopsy images demonstrate no hemorrhage.
IMPRESSION: Successful CT-guided right iliac bone marrow aspirate and core.

## 2018-10-21 NOTE — Progress Notes (Signed)
Patient Care Team: Cleda Mccreedy as PCP - General (Nurse Practitioner)  DIAGNOSIS:    ICD-10-CM   1. MGUS (monoclonal gammopathy of unknown significance) D47.2     CHIEF COMPLIANT: Follow-up of MGUS  INTERVAL HISTORY: Anthony [name] is a 58 y.o. with above-mentioned history of MGUS. His lab work from 09/30/18 shows: Hg 15.9, M protein 0.5, IgA 584, kappa 46.8, lambda 10.9, KAL ratio 4.29, beta-2 microglobulin 1.7, creatinine 0.98, calcium 9.3, ALT 58. He presents to the clinic today with his wife. He takes a Calcium supplement and reviewed his medication list with me.   REVIEW OF SYSTEMS:   Constitutional: Denies fevers, chills or abnormal weight loss Eyes: Denies blurriness of vision Ears, nose, mouth, throat, and face: Denies mucositis or sore throat Respiratory: Denies cough, dyspnea or wheezes Cardiovascular: Denies palpitation, chest discomfort Gastrointestinal: Denies nausea, heartburn or change in bowel habits Skin: Denies abnormal skin rashes Lymphatics: Denies new lymphadenopathy or easy bruising Neurological: Denies numbness, tingling or new weaknesses Behavioral/Psych: Mood is stable, no new changes  Extremities: No lower extremity edema All other systems were reviewed with the patient and are negative.  I have reviewed the past medical history, past surgical history, social history and family history with the patient and they are unchanged from previous note.  ALLERGIES:  has No Known Allergies.  MEDICATIONS:  Current Outpatient Medications  Medication Sig Dispense Refill  . aluminum chloride (DRYSOL) 20 % external solution Apply 1 application topically at bedtime.    Marland Kitchen aspirin EC 81 MG tablet Take 81 mg by mouth daily.    . calcium-vitamin D (OSCAL WITH D) 250-125 MG-UNIT tablet Take 1 tablet by mouth daily.    . diclofenac sodium (VOLTAREN) 1 % GEL Apply 2 g topically as needed.    Marland Kitchen ibuprofen (ADVIL,MOTRIN) 200 MG tablet Take 200 mg by mouth every 6  (six) hours as needed for moderate pain.    . Multiple Vitamin (MULTIVITAMIN) tablet Take 1 tablet by mouth daily.    . naftifine (NAFTIN) 1 % cream Apply 1 application topically daily.    . Omega-3 Fatty Acids (FISH OIL) 1360 MG CAPS Take 1 capsule by mouth daily.    Marland Kitchen terbinafine (LAMISIL) 1 % cream Apply 1 application topically 2 (two) times daily.    Marland Kitchen triamcinolone (KENALOG) 0.025 % cream Apply 1 application topically 2 (two) times daily.     No current facility-administered medications for this visit.     PHYSICAL EXAMINATION: ECOG PERFORMANCE STATUS: 1 - Symptomatic but completely ambulatory  Vitals:   10/23/18 0830  BP: 120/79  Pulse: 72  Resp: 18  Temp: 98.5 F (36.9 C)  SpO2: 100%   Filed Weights   10/23/18 0830  Weight: 213 lb 1.6 oz (96.7 kg)    GENERAL: alert, no distress and comfortable SKIN: skin color, texture, turgor are normal, no rashes or significant lesions EYES: normal, Conjunctiva are pink and non-injected, sclera clear OROPHARYNX: no exudate, no erythema and lips, buccal mucosa, and tongue normal  NECK: supple, thyroid normal size, non-tender, without nodularity LYMPH: no palpable lymphadenopathy in the cervical, axillary or inguinal LUNGS: clear to auscultation and percussion with normal breathing effort HEART: regular rate & rhythm and no murmurs and no lower extremity edema ABDOMEN: abdomen soft, non-tender and normal bowel sounds MUSCULOSKELETAL: no cyanosis of digits and no clubbing  NEURO: alert & oriented x 3 with fluent speech, no focal motor/sensory deficits EXTREMITIES: No lower extremity edema  LABORATORY DATA:  I have  reviewed the data as listed CMP Latest Ref Rng & Units 09/30/2018 04/01/2018 12/29/2017  Glucose 70 - 99 mg/dL 105(H) 91 92  BUN 6 - 20 mg/dL 18 14 17   Creatinine 0.61 - 1.24 mg/dL 0.98 0.90 1.12  Sodium 135 - 145 mmol/L 138 140 138  Potassium 3.5 - 5.1 mmol/L 4.2 4.3 4.2  Chloride 98 - 111 mmol/L 105 107 105  CO2 22 - 32  mmol/L 23 22 25   Calcium 8.9 - 10.3 mg/dL 9.3 9.3 9.7  Total Protein 6.5 - 8.1 g/dL 7.3 7.2 7.7  Total Bilirubin 0.3 - 1.2 mg/dL 0.6 0.5 0.6  Alkaline Phos 38 - 126 U/L 56 65 57  AST 15 - 41 U/L 36 16 20  ALT 0 - 44 U/L 58(H) 25 29    Lab Results  Component Value Date   WBC 4.7 09/30/2018   HGB 15.9 09/30/2018   HCT 47.4 09/30/2018   MCV 88.9 09/30/2018   PLT 189 09/30/2018   NEUTROABS 2.4 09/30/2018    ASSESSMENT & PLAN:  MGUS (monoclonal gammopathy of unknown significance) IgA kappa MGUS: Diagnosed 04/17/2017 07/03/17: tProt 7.4, Alb 4.3, Ca 9.6, Cr 1.2, AP 55, LDH 147, beta-2 microglobulin ...; SPEP -- M-spike 0.2g/dL, SIFE -- monoclonal IgA kappa; IgG 722, IgA 565, IgM 60; kappa 45.3, lambda 10.8, KLR 4.19; WBC 3.0, Hgb 14.8, Plt 159;   PET-CT, 07/22/18: No evidence of metabolically active malignancy, no evidence of lytic skeletal lesions.  BM Bx, 07/23/18: Pathology -- normocellular bone marrow with monoclonal plasma cell population measuring 5-10% of total cellularity. FlowCyto --no aberrant monoclonal populations of lymphocytes, no elevation of blasts. CytoGen & FISH --no abnormal findings.  Lab review 09/30/18: Hb 15.9, M protein: 0.5 gm (was 0.4 gm) Kappa 46.8, lambda 10.9, ratio 4.29 was previously 4.34 Beta 2 microglobulin: 1.7 I discussed with the patient the results of recently performed lab work.  There are no signs or symptoms of myeloma.  The M protein is fluctuating from originally 0.2 went up to 0.5 but now it is 0.4.  Recommendation: Continue to watch and monitor once a year with labs.  No orders of the defined types were placed in this encounter.  The patient has a good understanding of the overall plan. he agrees with it. he will call with any problems that may develop before the next visit here.  Nicholas Lose, MD 10/23/2018  [name] am acting as scribe for Dr. Nicholas Lose.  I have reviewed the above documentation for accuracy and  completeness, and I agree with the above.

## 2018-10-23 ENCOUNTER — Inpatient Hospital Stay: Payer: BLUE CROSS/BLUE SHIELD | Attending: Hematology and Oncology | Admitting: Hematology and Oncology

## 2018-10-23 ENCOUNTER — Telehealth: Payer: Self-pay | Admitting: Hematology and Oncology

## 2018-10-23 DIAGNOSIS — D472 Monoclonal gammopathy: Secondary | ICD-10-CM | POA: Diagnosis not present

## 2018-10-23 DIAGNOSIS — Z7982 Long term (current) use of aspirin: Secondary | ICD-10-CM | POA: Diagnosis not present

## 2018-10-23 NOTE — Telephone Encounter (Signed)
Gave avs and calendar ° °

## 2018-10-23 NOTE — Assessment & Plan Note (Signed)
IgA kappa MGUS: Diagnosed 04/17/2017 07/03/17: tProt 7.4, Alb 4.3, Ca 9.6, Cr 1.2, AP 55, LDH 147, beta-2 microglobulin ...; SPEP -- M-spike 0.2g/dL, SIFE -- monoclonal IgA kappa; IgG 722, IgA 565, IgM 60; kappa 45.3, lambda 10.8, KLR 4.19; WBC 3.0, Hgb 14.8, Plt 159;   PET-CT, 07/22/18: No evidence of metabolically active malignancy, no evidence of lytic skeletal lesions.  BM Bx, 07/23/18: Pathology -- normocellular bone marrow with monoclonal plasma cell population measuring 5-10% of total cellularity. FlowCyto --no aberrant monoclonal populations of lymphocytes, no elevation of blasts. CytoGen & FISH --no abnormal findings.  Lab review 09/30/18: Hb 15.9, M protein: 0.5 gm (was 0.4 gm) Kappa 46.8, lambda 10.9, ratio 4.29 was previously 4.34 Beta 2 microglobulin: 1.7 I discussed with the patient the results of recently performed lab work.  There are no signs or symptoms of myeloma.  The M protein is fluctuating from originally 0.2 went up to 0.5 but now it is 0.4.  Recommendation: Continue to watch and monitor every 6 months with labs.

## 2019-01-06 DIAGNOSIS — R972 Elevated prostate specific antigen [PSA]: Secondary | ICD-10-CM | POA: Diagnosis not present

## 2019-01-08 DIAGNOSIS — R8271 Bacteriuria: Secondary | ICD-10-CM | POA: Diagnosis not present

## 2019-01-08 DIAGNOSIS — R972 Elevated prostate specific antigen [PSA]: Secondary | ICD-10-CM | POA: Diagnosis not present

## 2019-01-12 ENCOUNTER — Other Ambulatory Visit: Payer: Self-pay | Admitting: Urology

## 2019-01-12 DIAGNOSIS — R972 Elevated prostate specific antigen [PSA]: Secondary | ICD-10-CM

## 2019-02-08 ENCOUNTER — Other Ambulatory Visit: Payer: Self-pay

## 2019-02-08 ENCOUNTER — Ambulatory Visit
Admission: RE | Admit: 2019-02-08 | Discharge: 2019-02-08 | Disposition: A | Discharge status: Home / Self Care | Payer: BC Managed Care – PPO | Source: Ambulatory Visit | Attending: Urology | Admitting: Urology

## 2019-02-08 DIAGNOSIS — R972 Elevated prostate specific antigen [PSA]: Secondary | ICD-10-CM

## 2019-02-08 DIAGNOSIS — N429 Disorder of prostate, unspecified: Secondary | ICD-10-CM | POA: Diagnosis not present

## 2019-02-08 MED ORDER — GADOBENATE DIMEGLUMINE 529 MG/ML IV SOLN
20.0000 mL | Freq: Once | INTRAVENOUS | Status: AC | PRN
  Administered 2019-02-08: 20 mL via INTRAVENOUS

## 2019-02-10 DIAGNOSIS — Z0000 Encounter for general adult medical examination without abnormal findings: Secondary | ICD-10-CM | POA: Diagnosis not present

## 2019-03-09 DIAGNOSIS — R972 Elevated prostate specific antigen [PSA]: Secondary | ICD-10-CM | POA: Diagnosis not present

## 2019-03-29 DIAGNOSIS — H524 Presbyopia: Secondary | ICD-10-CM | POA: Diagnosis not present

## 2019-05-08 DIAGNOSIS — Z23 Encounter for immunization: Secondary | ICD-10-CM | POA: Diagnosis not present

## 2019-06-13 ENCOUNTER — Telehealth: Payer: BC Managed Care – PPO | Admitting: Family

## 2019-06-13 DIAGNOSIS — Z20822 Contact with and (suspected) exposure to covid-19: Secondary | ICD-10-CM

## 2019-06-13 NOTE — Progress Notes (Signed)
E-Visit for Corona Virus Screening   Your current symptoms could be consistent with the coronavirus.  Many health care providers can now test patients at their office but not all are.  [place] has multiple testing sites. For information on our COVID testing locations and hours go to [email or web address]  Please quarantine yourself while awaiting your test results.  We are enrolling you in our [place] for [place] . Daily you will receive a questionnaire within the [place] website. Our COVID 19 response team willl be monitoriing your responses daily.  You can go to one of the testing sites listed below, while they are opened (see hours). You do not need an order and will stay in your car during the test. You do need to self isolate until your results return and if positive 10 days from when your symptoms started and until you are 3 days fever free.   Testing Locations (Monday - Friday, 8 a.m. - 3:30 p.m.) . [place]: [facility] at [facility], [address], [place], [place]: [place], [place], [place], [place] (entrance off [address])  . [facility]: (Closed each Monday): Testing site relocated to the short stay covered drive at [facility]. (Use the [facility] entrance to [facility] next to [place] is a respiratory illness with symptoms that are similar to the flu. Symptoms are typically mild to moderate, but there have been cases of severe illness and death due to the virus. The following symptoms may appear 2-14 days after exposure: . Fever . Cough . Shortness of breath or difficulty breathing . Chills . Repeated shaking with chills . Muscle pain . Headache . Sore throat . New loss of taste or smell . Fatigue . Congestion or runny nose . Nausea or vomiting . Diarrhea  It is vitally important that if you feel that you  have an infection such as this virus or any other virus that you stay home and away from places where you may spread it to others.  You should self-quarantine for 14 days if you have symptoms that could potentially be coronavirus or have been in close contact a with a person diagnosed with COVID-19 within the last 2 weeks. You should avoid contact with people age 59 and older.   You should wear a mask or cloth face covering over your nose and mouth if you must be around other people or animals, including pets (even at home). Try to stay at least 6 feet away from other people. This will protect the people around you.  You may also take acetaminophen (Tylenol) as needed for fever.   Reduce your risk of any infection by using the same precautions used for avoiding the common cold or flu:  Marland Kitchen Wash your hands often with soap and warm water for at least 20 seconds.  If soap and water are not readily available, use an alcohol-based hand sanitizer with at least 60% alcohol.  . If coughing or sneezing, cover your mouth and nose by coughing or sneezing into the elbow areas of your shirt or coat, into a tissue or into your sleeve (not your hands). . Avoid shaking hands with others and consider head nods or verbal greetings only. . Avoid touching your eyes, nose, or mouth with unwashed hands.  . Avoid close contact with people who are sick. . Avoid places or events with large numbers of people in  one location, like concerts or sporting events. . Carefully consider travel plans you have or are making. . If you are planning any travel outside or inside the [place], visit the CDC's Travelers' Health webpage for the latest health notices. . If you have some symptoms but not all symptoms, continue to monitor at home and seek medical attention if your symptoms worsen. . If you are having a medical emergency, call 911.  HOME CARE . Only take medications as instructed by your medical team. . Drink plenty of fluids and  get plenty of rest. . A steam or ultrasonic humidifier can help if you have congestion.   GET HELP RIGHT AWAY IF YOU HAVE EMERGENCY WARNING SIGNS** FOR COVID-19. If you or someone is showing any of these signs seek emergency medical care immediately. Call 911 or proceed to your closest emergency facility if: . You develop worsening high fever. . Trouble breathing . Bluish lips or face . Persistent pain or pressure in the chest . New confusion . Inability to wake or stay awake . You cough up blood. . Your symptoms become more severe  **This list is not all possible symptoms. Contact your medical provider for any symptoms that are sever or concerning to you.   MAKE SURE YOU   Understand these instructions.  Will watch your condition.  Will get help right away if you are not doing well or get worse.  Your e-visit answers were reviewed by a board certified advanced clinical practitioner to complete your personal care plan.  Depending on the condition, your plan could have included both over the counter or prescription medications.  If there is a problem please reply once you have received a response from your provider.  Your safety is important to [place].  If you have drug allergies check your prescription carefully.    You can use MyChart to ask questions about today's visit, request a non-urgent call back, or ask for a work or school excuse for 24 hours related to this e-Visit. If it has been greater than 24 hours you will need to follow up with your provider, or enter a new e-Visit to address those concerns. You will get an e-mail in the next two days asking about your experience.  I hope that your e-visit has been valuable and will speed your recovery. Thank you for using e-visits.  E-Visit for Corona Virus Screening   Your current symptoms could be consistent with the coronavirus.  Many health care providers can now test patients at their office but not all are.  [place] has  multiple testing sites. For information on our COVID testing locations and hours go to [email or web address]  Please quarantine yourself while awaiting your test results.  We are enrolling you in our [place] for [place] . Daily you will receive a questionnaire within the [place] website. Our COVID 19 response team willl be monitoriing your responses daily.  You can go to one of the testing sites listed below, while they are opened (see hours). You do not need an order and will stay in your car during the test. You do need to self isolate until your results return and if positive 10 days from when your symptoms started and until you are 3 days fever free.   Testing Locations (Monday - Friday, 8 a.m. - 3:30 p.m.) . [place]: [facility] at [facility], [address], [place], [place]: [place], [place],  [place], [place] (entrance off [address])  . [facility]: (Closed each Monday): Testing site relocated to the short stay covered drive at [facility]. (Use the [facility] entrance to [facility] next to [place] is a respiratory illness with symptoms that are similar to the flu. Symptoms are typically mild to moderate, but there have been cases of severe illness and death due to the virus. The following symptoms may appear 2-14 days after exposure: . Fever . Cough . Shortness of breath or difficulty breathing . Chills . Repeated shaking with chills . Muscle pain . Headache . Sore throat . New loss of taste or smell . Fatigue . Congestion or runny nose . Nausea or vomiting . Diarrhea  It is vitally important that if you feel that you have an infection such as this virus or any other virus that you stay home and away from places where you may spread it to others.  You should self-quarantine for 14 days if you have symptoms that could  potentially be coronavirus or have been in close contact a with a person diagnosed with COVID-19 within the last 2 weeks. You should avoid contact with people age 64 and older.   You should wear a mask or cloth face covering over your nose and mouth if you must be around other people or animals, including pets (even at home). Try to stay at least 6 feet away from other people. This will protect the people around you.  You may also take acetaminophen (Tylenol) as needed for fever.   Reduce your risk of any infection by using the same precautions used for avoiding the common cold or flu:  Marland Kitchen Wash your hands often with soap and warm water for at least 20 seconds.  If soap and water are not readily available, use an alcohol-based hand sanitizer with at least 60% alcohol.  . If coughing or sneezing, cover your mouth and nose by coughing or sneezing into the elbow areas of your shirt or coat, into a tissue or into your sleeve (not your hands). . Avoid shaking hands with others and consider head nods or verbal greetings only. . Avoid touching your eyes, nose, or mouth with unwashed hands.  . Avoid close contact with people who are sick. . Avoid places or events with large numbers of people in one location, like concerts or sporting events. . Carefully consider travel plans you have or are making. . If you are planning any travel outside or inside the [place], visit the CDC's Travelers' Health webpage for the latest health notices. . If you have some symptoms but not all symptoms, continue to monitor at home and seek medical attention if your symptoms worsen. . If you are having a medical emergency, call 911.  HOME CARE . Only take medications as instructed by your medical team. . Drink plenty of fluids and get plenty of rest. . A steam or ultrasonic humidifier can help if you have congestion.   GET HELP RIGHT AWAY IF YOU HAVE EMERGENCY WARNING SIGNS** FOR COVID-19. If you or someone is showing any of  these signs seek emergency medical care immediately. Call 911 or proceed to your closest emergency facility if: . You develop worsening high fever. . Trouble breathing . Bluish lips or face . Persistent pain or pressure in the chest . New confusion . Inability to wake or stay awake . You cough up blood. . Your symptoms become more severe  **This  list is not all possible symptoms. Contact your medical provider for any symptoms that are sever or concerning to you.   MAKE SURE YOU   Understand these instructions.  Will watch your condition.  Will get help right away if you are not doing well or get worse.  Your e-visit answers were reviewed by a board certified advanced clinical practitioner to complete your personal care plan.  Depending on the condition, your plan could have included both over the counter or prescription medications.  If there is a problem please reply once you have received a response from your provider.  Your safety is important to [place].  If you have drug allergies check your prescription carefully.    You can use MyChart to ask questions about today's visit, request a non-urgent call back, or ask for a work or school excuse for 24 hours related to this e-Visit. If it has been greater than 24 hours you will need to follow up with your provider, or enter a new e-Visit to address those concerns. You will get an e-mail in the next two days asking about your experience.  I hope that your e-visit has been valuable and will speed your recovery. Thank you for using e-visits.  Approximately 5 minutes was spent documenting and reviewing patient's chart.

## 2019-06-14 ENCOUNTER — Other Ambulatory Visit: Payer: Self-pay

## 2019-06-14 DIAGNOSIS — Z20822 Contact with and (suspected) exposure to covid-19: Secondary | ICD-10-CM

## 2019-06-15 LAB — NOVEL CORONAVIRUS, NAA: SARS-CoV-2, NAA: NOT DETECTED

## 2019-07-19 ENCOUNTER — Other Ambulatory Visit: Payer: Self-pay | Admitting: *Deleted

## 2019-07-19 DIAGNOSIS — D472 Monoclonal gammopathy: Secondary | ICD-10-CM

## 2019-07-20 ENCOUNTER — Inpatient Hospital Stay: Payer: BC Managed Care – PPO | Attending: Hematology and Oncology

## 2019-07-20 ENCOUNTER — Other Ambulatory Visit: Payer: Self-pay

## 2019-07-20 DIAGNOSIS — Z791 Long term (current) use of non-steroidal anti-inflammatories (NSAID): Secondary | ICD-10-CM | POA: Insufficient documentation

## 2019-07-20 DIAGNOSIS — Z7982 Long term (current) use of aspirin: Secondary | ICD-10-CM | POA: Diagnosis not present

## 2019-07-20 DIAGNOSIS — D472 Monoclonal gammopathy: Secondary | ICD-10-CM | POA: Insufficient documentation

## 2019-07-20 LAB — CBC WITH DIFFERENTIAL (CANCER CENTER ONLY)
Abs Immature Granulocytes: 0.02 10*3/uL (ref 0.00–0.07)
Basophils Absolute: 0 10*3/uL (ref 0.0–0.1)
Basophils Relative: 0 %
Eosinophils Absolute: 0.2 10*3/uL (ref 0.0–0.5)
Eosinophils Relative: 4 %
HCT: 49.1 % (ref 39.0–52.0)
Hemoglobin: 16.4 g/dL (ref 13.0–17.0)
Immature Granulocytes: 0 %
Lymphocytes Relative: 27 %
Lymphs Abs: 1.4 10*3/uL (ref 0.7–4.0)
MCH: 28.7 pg (ref 26.0–34.0)
MCHC: 33.4 g/dL (ref 30.0–36.0)
MCV: 85.8 fL (ref 80.0–100.0)
Monocytes Absolute: 0.7 10*3/uL (ref 0.1–1.0)
Monocytes Relative: 13 %
Neutro Abs: 2.9 10*3/uL (ref 1.7–7.7)
Neutrophils Relative %: 56 %
Platelet Count: 205 10*3/uL (ref 150–400)
RBC: 5.72 MIL/uL (ref 4.22–5.81)
RDW: 13.5 % (ref 11.5–15.5)
WBC Count: 5.2 10*3/uL (ref 4.0–10.5)
nRBC: 0 % (ref 0.0–0.2)

## 2019-07-20 LAB — CMP (CANCER CENTER ONLY)
ALT: 43 U/L (ref 0–44)
AST: 26 U/L (ref 15–41)
Albumin: 4.6 g/dL (ref 3.5–5.0)
Alkaline Phosphatase: 59 U/L (ref 38–126)
Anion gap: 10 (ref 5–15)
BUN: 20 mg/dL (ref 6–20)
CO2: 25 mmol/L (ref 22–32)
Calcium: 9.8 mg/dL (ref 8.9–10.3)
Chloride: 103 mmol/L (ref 98–111)
Creatinine: 1.13 mg/dL (ref 0.61–1.24)
GFR, Est AFR Am: >60 mL/min (ref >60)
GFR, Estimated: >60 mL/min (ref >60)
Glucose, Bld: 94 mg/dL (ref 70–99)
Potassium: 4.5 mmol/L (ref 3.5–5.1)
Sodium: 138 mmol/L (ref 135–145)
Total Bilirubin: 0.4 mg/dL (ref 0.3–1.2)
Total Protein: 8 g/dL (ref 6.5–8.1)

## 2019-07-21 LAB — MULTIPLE MYELOMA PANEL, SERUM
Albumin SerPl Elph-Mcnc: 4.1 g/dL (ref 2.9–4.4)
Albumin/Glob SerPl: 1.3 (ref 0.7–1.7)
Alpha 1: 0.3 g/dL (ref 0.0–0.4)
Alpha2 Glob SerPl Elph-Mcnc: 0.6 g/dL (ref 0.4–1.0)
B-Globulin SerPl Elph-Mcnc: 1.7 g/dL — ABNORMAL HIGH (ref 0.7–1.3)
Gamma Glob SerPl Elph-Mcnc: 0.8 g/dL (ref 0.4–1.8)
Globulin, Total: 3.4 g/dL (ref 2.2–3.9)
IgA: 699 mg/dL — ABNORMAL HIGH (ref 90–386)
IgG (Immunoglobin G), Serum: 814 mg/dL (ref 603–1613)
IgM (Immunoglobulin M), Srm: 59 mg/dL (ref 20–172)
M Protein SerPl Elph-Mcnc: 0.5 g/dL — ABNORMAL HIGH
Total Protein ELP: 7.5 g/dL (ref 6.0–8.5)

## 2019-07-21 LAB — KAPPA/LAMBDA LIGHT CHAINS
Kappa free light chain: 96.9 mg/L — ABNORMAL HIGH (ref 3.3–19.4)
Kappa, lambda light chain ratio: 8.21 — ABNORMAL HIGH (ref 0.26–1.65)
Lambda free light chains: 11.8 mg/L (ref 5.7–26.3)

## 2019-07-21 LAB — BETA 2 MICROGLOBULIN, SERUM: Beta-2 Microglobulin: 1.5 mg/L (ref 0.6–2.4)

## 2019-07-26 NOTE — Progress Notes (Signed)
Patient Care Team: Cleda Mccreedy as PCP - General (Nurse Practitioner)  DIAGNOSIS:    ICD-10-CM   1. MGUS (monoclonal gammopathy of unknown significance)  D47.2     CHIEF COMPLIANT: Follow-up of MGUS to review labs  INTERVAL HISTORY: Anthony [name] is a 58 y.o. with above-mentioned history of MGUS. His lab work from 07/20/19 shows: Hg 16.4, M protein 0.5, IgA 699, kappa 96.9, lambda 11,8, ratio 8.21, beta-2 microglobulin 1.5. He presents to the clinic today for follow-up.   REVIEW OF SYSTEMS:   Constitutional: Denies fevers, chills or abnormal weight loss Eyes: Denies blurriness of vision Ears, nose, mouth, throat, and face: Denies mucositis or sore throat Respiratory: Denies cough, dyspnea or wheezes Cardiovascular: Denies palpitation, chest discomfort Gastrointestinal: Denies nausea, heartburn or change in bowel habits Skin: Denies abnormal skin rashes Lymphatics: Denies new lymphadenopathy or easy bruising Neurological: Denies numbness, tingling or new weaknesses Behavioral/Psych: Mood is stable, no new changes  Extremities: No lower extremity edema All other systems were reviewed with the patient and are negative.  I have reviewed the past medical history, past surgical history, social history and family history with the patient and they are unchanged from previous note.  ALLERGIES:  has No Known Allergies.  MEDICATIONS:  Current Outpatient Medications  Medication Sig Dispense Refill  . aluminum chloride (DRYSOL) 20 % external solution Apply 1 application topically at bedtime.    Marland Kitchen aspirin EC 81 MG tablet Take 81 mg by mouth daily.    . calcium-vitamin D (OSCAL WITH D) 250-125 MG-UNIT tablet Take 1 tablet by mouth daily.    . diclofenac sodium (VOLTAREN) 1 % GEL Apply 2 g topically as needed.    Marland Kitchen ibuprofen (ADVIL,MOTRIN) 200 MG tablet Take 200 mg by mouth every 6 (six) hours as needed for moderate pain.    . Multiple Vitamin (MULTIVITAMIN) tablet Take 1 tablet by  mouth daily.    . naftifine (NAFTIN) 1 % cream Apply 1 application topically daily.    . Omega-3 Fatty Acids (FISH OIL) 1360 MG CAPS Take 1 capsule by mouth daily.    Marland Kitchen terbinafine (LAMISIL) 1 % cream Apply 1 application topically 2 (two) times daily.    Marland Kitchen triamcinolone (KENALOG) 0.025 % cream Apply 1 application topically 2 (two) times daily.     No current facility-administered medications for this visit.     PHYSICAL EXAMINATION: ECOG PERFORMANCE STATUS: 0 - Asymptomatic  Vitals:   07/27/19 0843  BP: (!) 147/87  Pulse: 72  Resp: 20  Temp: 98 F (36.7 C)  SpO2: 98%   Filed Weights   07/27/19 0843  Weight: 225 lb 8 oz (102.3 kg)    GENERAL: alert, no distress and comfortable SKIN: skin color, texture, turgor are normal, no rashes or significant lesions EYES: normal, Conjunctiva are pink and non-injected, sclera clear OROPHARYNX: no exudate, no erythema and lips, buccal mucosa, and tongue normal  NECK: supple, thyroid normal size, non-tender, without nodularity LYMPH: no palpable lymphadenopathy in the cervical, axillary or inguinal LUNGS: clear to auscultation and percussion with normal breathing effort HEART: regular rate & rhythm and no murmurs and no lower extremity edema ABDOMEN: abdomen soft, non-tender and normal bowel sounds MUSCULOSKELETAL: no cyanosis of digits and no clubbing  NEURO: alert & oriented x 3 with fluent speech, no focal motor/sensory deficits EXTREMITIES: No lower extremity edema  LABORATORY DATA:  I have reviewed the data as listed CMP Latest Ref Rng & Units 07/20/2019 09/30/2018 04/01/2018  Glucose 70 -  99 mg/dL 94 105(H) 91  BUN 6 - 20 mg/dL 20 18 14   Creatinine 0.61 - 1.24 mg/dL 1.13 0.98 0.90  Sodium 135 - 145 mmol/L 138 138 140  Potassium 3.5 - 5.1 mmol/L 4.5 4.2 4.3  Chloride 98 - 111 mmol/L 103 105 107  CO2 22 - 32 mmol/L 25 23 22   Calcium 8.9 - 10.3 mg/dL 9.8 9.3 9.3  Total Protein 6.5 - 8.1 g/dL 8.0 7.3 7.2  Total Bilirubin 0.3 - 1.2  mg/dL 0.4 0.6 0.5  Alkaline Phos 38 - 126 U/L 59 56 65  AST 15 - 41 U/L 26 36 16  ALT 0 - 44 U/L 43 58(H) 25    Lab Results  Component Value Date   WBC 5.2 07/20/2019   HGB 16.4 07/20/2019   HCT 49.1 07/20/2019   MCV 85.8 07/20/2019   PLT 205 07/20/2019   NEUTROABS 2.9 07/20/2019    ASSESSMENT & PLAN:  MGUS (monoclonal gammopathy of unknown significance) IgA kappa MGUS: Diagnosed 04/17/2017 07/03/17: tProt 7.4, Alb 4.3, Ca 9.6, Cr 1.2, AP 55, LDH 147, beta-2 microglobulin ...; SPEP -- M-spike 0.2g/dL, SIFE -- monoclonal IgA kappa; IgG 722, IgA 565, IgM 60; kappa 45.3, lambda 10.8, KLR 4.19; WBC 3.0, Hgb 14.8, Plt 159;   PET-CT, 07/22/18: No evidence of metabolically active malignancy, no evidence of lytic skeletal lesions.  BM Bx, 07/23/18: Pathology -- normocellular bone marrow with monoclonal plasma cell population measuring 5-10% of total cellularity. FlowCyto --no aberrant monoclonal populations of lymphocytes, no elevation of blasts. CytoGen & FISH --no abnormal findings.  Lab review  09/30/18: Hb 15.9, M protein: 0.5 gm (was 0.4 gm) Kappa 46.8, lambda 10.9, ratio 4.29 was previously 4.34 Beta 2 microglobulin: 1.7 07/20/2019: Hemoglobin 16.4, M protein 0.5 g IgA kappa, kappa 96.9, lambda 11.8, ratio 8.21, beta-2 microglobulin 1.5  Discussion: I discussed with the patient that the total M protein level has remained relatively stable.  The light chains have increased very slightly.  There is no immediate cause for concern.  I recommend continued annual follow-up surveillance with labs.  We discussed the importance of maintaining appropriate weight.  I encouraged the patient to try intermittent fasting.  No orders of the defined types were placed in this encounter.  The patient has a good understanding of the overall plan. he agrees with it. he will call with any problems that may develop before the next visit here.  Nicholas Lose, MD 07/27/2019  [name], am  acting as scribe for Dr. Nicholas Lose.  I have reviewed the above documentation for accuracy and completeness, and I agree with the above.

## 2019-07-27 ENCOUNTER — Other Ambulatory Visit: Payer: Self-pay

## 2019-07-27 ENCOUNTER — Inpatient Hospital Stay (HOSPITAL_BASED_OUTPATIENT_CLINIC_OR_DEPARTMENT_OTHER): Payer: BC Managed Care – PPO | Admitting: Hematology and Oncology

## 2019-07-27 DIAGNOSIS — D472 Monoclonal gammopathy: Secondary | ICD-10-CM

## 2019-07-27 DIAGNOSIS — Z791 Long term (current) use of non-steroidal anti-inflammatories (NSAID): Secondary | ICD-10-CM | POA: Diagnosis not present

## 2019-07-27 DIAGNOSIS — Z7982 Long term (current) use of aspirin: Secondary | ICD-10-CM | POA: Diagnosis not present

## 2019-07-27 NOTE — Assessment & Plan Note (Signed)
IgA kappa MGUS: Diagnosed 04/17/2017 07/03/17: tProt 7.4, Alb 4.3, Ca 9.6, Cr 1.2, AP 55, LDH 147, beta-2 microglobulin ...; SPEP -- M-spike 0.2g/dL, SIFE -- monoclonal IgA kappa; IgG 722, IgA 565, IgM 60; kappa 45.3, lambda 10.8, KLR 4.19; WBC 3.0, Hgb 14.8, Plt 159;   PET-CT, 07/22/18: No evidence of metabolically active malignancy, no evidence of lytic skeletal lesions.  BM Bx, 07/23/18: Pathology -- normocellular bone marrow with monoclonal plasma cell population measuring 5-10% of total cellularity. FlowCyto --no aberrant monoclonal populations of lymphocytes, no elevation of blasts. CytoGen & FISH --no abnormal findings.  Lab review  09/30/18: Hb 15.9, M protein: 0.5 gm (was 0.4 gm) Kappa 46.8, lambda 10.9, ratio 4.29 was previously 4.34 Beta 2 microglobulin: 1.7 07/20/2019: Hemoglobin 16.4, M protein 0.5 g IgA kappa, kappa 96.9, lambda 11.8, ratio 8.21, beta-2 microglobulin 1.5  Discussion: I discussed with the patient that the total M protein level has remained relatively stable.  The light chains have increased very slightly.  There is no immediate cause for concern.  I recommend continued annual follow-up surveillance with labs.

## 2019-07-28 ENCOUNTER — Telehealth: Payer: Self-pay | Admitting: Hematology and Oncology

## 2019-07-28 NOTE — Telephone Encounter (Signed)
I talk with patient regarding schedule  

## 2019-08-16 DIAGNOSIS — R972 Elevated prostate specific antigen [PSA]: Secondary | ICD-10-CM | POA: Diagnosis not present

## 2019-09-01 DIAGNOSIS — S8991XA Unspecified injury of right lower leg, initial encounter: Secondary | ICD-10-CM | POA: Diagnosis not present

## 2019-09-01 DIAGNOSIS — M25561 Pain in right knee: Secondary | ICD-10-CM | POA: Diagnosis not present

## 2019-09-10 DIAGNOSIS — N400 Enlarged prostate without lower urinary tract symptoms: Secondary | ICD-10-CM | POA: Diagnosis not present

## 2019-09-10 DIAGNOSIS — R972 Elevated prostate specific antigen [PSA]: Secondary | ICD-10-CM | POA: Diagnosis not present

## 2019-10-18 DIAGNOSIS — M9904 Segmental and somatic dysfunction of sacral region: Secondary | ICD-10-CM | POA: Diagnosis not present

## 2019-10-18 DIAGNOSIS — M4726 Other spondylosis with radiculopathy, lumbar region: Secondary | ICD-10-CM | POA: Diagnosis not present

## 2019-11-29 DIAGNOSIS — M4726 Other spondylosis with radiculopathy, lumbar region: Secondary | ICD-10-CM | POA: Diagnosis not present

## 2019-11-29 DIAGNOSIS — M9904 Segmental and somatic dysfunction of sacral region: Secondary | ICD-10-CM | POA: Diagnosis not present

## 2020-03-22 DIAGNOSIS — N400 Enlarged prostate without lower urinary tract symptoms: Secondary | ICD-10-CM | POA: Diagnosis not present

## 2020-03-28 DIAGNOSIS — Z20828 Contact with and (suspected) exposure to other viral communicable diseases: Secondary | ICD-10-CM | POA: Diagnosis not present

## 2020-04-06 DIAGNOSIS — R351 Nocturia: Secondary | ICD-10-CM | POA: Diagnosis not present

## 2020-04-06 DIAGNOSIS — R972 Elevated prostate specific antigen [PSA]: Secondary | ICD-10-CM | POA: Diagnosis not present

## 2020-06-13 DIAGNOSIS — B353 Tinea pedis: Secondary | ICD-10-CM | POA: Diagnosis not present

## 2020-06-13 DIAGNOSIS — B356 Tinea cruris: Secondary | ICD-10-CM | POA: Diagnosis not present

## 2020-06-13 DIAGNOSIS — L821 Other seborrheic keratosis: Secondary | ICD-10-CM | POA: Diagnosis not present

## 2020-07-19 ENCOUNTER — Other Ambulatory Visit: Payer: BC Managed Care – PPO

## 2020-07-26 ENCOUNTER — Other Ambulatory Visit: Payer: Self-pay

## 2020-07-26 ENCOUNTER — Inpatient Hospital Stay: Payer: BC Managed Care – PPO | Attending: Hematology and Oncology

## 2020-07-26 ENCOUNTER — Ambulatory Visit: Payer: BC Managed Care – PPO | Admitting: Hematology and Oncology

## 2020-07-26 DIAGNOSIS — Z7982 Long term (current) use of aspirin: Secondary | ICD-10-CM | POA: Diagnosis not present

## 2020-07-26 DIAGNOSIS — Z791 Long term (current) use of non-steroidal anti-inflammatories (NSAID): Secondary | ICD-10-CM | POA: Insufficient documentation

## 2020-07-26 DIAGNOSIS — D472 Monoclonal gammopathy: Secondary | ICD-10-CM | POA: Diagnosis not present

## 2020-07-26 LAB — CMP (CANCER CENTER ONLY)
ALT: 26 U/L (ref 0–44)
AST: 21 U/L (ref 15–41)
Albumin: 3.9 g/dL (ref 3.5–5.0)
Alkaline Phosphatase: 57 U/L (ref 38–126)
Anion gap: 8 (ref 5–15)
BUN: 15 mg/dL (ref 6–20)
CO2: 24 mmol/L (ref 22–32)
Calcium: 9 mg/dL (ref 8.9–10.3)
Chloride: 108 mmol/L (ref 98–111)
Creatinine: 1.13 mg/dL (ref 0.61–1.24)
GFR, Estimated: >60 mL/min (ref >60)
Glucose, Bld: 112 mg/dL — ABNORMAL HIGH (ref 70–99)
Potassium: 4.1 mmol/L (ref 3.5–5.1)
Sodium: 140 mmol/L (ref 135–145)
Total Bilirubin: 0.4 mg/dL (ref 0.3–1.2)
Total Protein: 7.2 g/dL (ref 6.5–8.1)

## 2020-07-26 LAB — CBC WITH DIFFERENTIAL (CANCER CENTER ONLY)
Abs Immature Granulocytes: 0.02 10*3/uL (ref 0.00–0.07)
Basophils Absolute: 0 10*3/uL (ref 0.0–0.1)
Basophils Relative: 1 %
Eosinophils Absolute: 0.2 10*3/uL (ref 0.0–0.5)
Eosinophils Relative: 5 %
HCT: 46.3 % (ref 39.0–52.0)
Hemoglobin: 15.4 g/dL (ref 13.0–17.0)
Immature Granulocytes: 1 %
Lymphocytes Relative: 35 %
Lymphs Abs: 1.5 10*3/uL (ref 0.7–4.0)
MCH: 28.6 pg (ref 26.0–34.0)
MCHC: 33.3 g/dL (ref 30.0–36.0)
MCV: 86.1 fL (ref 80.0–100.0)
Monocytes Absolute: 0.5 10*3/uL (ref 0.1–1.0)
Monocytes Relative: 12 %
Neutro Abs: 2 10*3/uL (ref 1.7–7.7)
Neutrophils Relative %: 46 %
Platelet Count: 198 10*3/uL (ref 150–400)
RBC: 5.38 MIL/uL (ref 4.22–5.81)
RDW: 13.8 % (ref 11.5–15.5)
WBC Count: 4.3 10*3/uL (ref 4.0–10.5)
nRBC: 0 % (ref 0.0–0.2)

## 2020-07-27 LAB — KAPPA/LAMBDA LIGHT CHAINS
Kappa free light chain: 47.1 mg/L — ABNORMAL HIGH (ref 3.3–19.4)
Kappa, lambda light chain ratio: 3.71 — ABNORMAL HIGH (ref 0.26–1.65)
Lambda free light chains: 12.7 mg/L (ref 5.7–26.3)

## 2020-07-27 LAB — BETA 2 MICROGLOBULIN, SERUM: Beta-2 Microglobulin: 1.4 mg/L (ref 0.6–2.4)

## 2020-07-28 LAB — MULTIPLE MYELOMA PANEL, SERUM
Albumin SerPl Elph-Mcnc: 3.7 g/dL (ref 2.9–4.4)
Albumin/Glob SerPl: 1.3 (ref 0.7–1.7)
Alpha 1: 0.2 g/dL (ref 0.0–0.4)
Alpha2 Glob SerPl Elph-Mcnc: 0.5 g/dL (ref 0.4–1.0)
B-Globulin SerPl Elph-Mcnc: 1.4 g/dL — ABNORMAL HIGH (ref 0.7–1.3)
Gamma Glob SerPl Elph-Mcnc: 0.8 g/dL (ref 0.4–1.8)
Globulin, Total: 2.9 g/dL (ref 2.2–3.9)
IgA: 683 mg/dL — ABNORMAL HIGH (ref 90–386)
IgG (Immunoglobin G), Serum: 788 mg/dL (ref 603–1613)
IgM (Immunoglobulin M), Srm: 53 mg/dL (ref 20–172)
M Protein SerPl Elph-Mcnc: 0.4 g/dL — ABNORMAL HIGH
Total Protein ELP: 6.6 g/dL (ref 6.0–8.5)

## 2020-08-01 NOTE — Progress Notes (Signed)
Patient Care Team: Lennie Odor, [place] as PCP - General (Nurse Practitioner)  DIAGNOSIS:    ICD-10-CM   1. MGUS (monoclonal gammopathy of unknown significance)  D47.2     CHIEF COMPLIANT: Follow-up of MGUS to review labs  INTERVAL HISTORY: Anthony [name] is a 59 y.o. with above-mentioned history of MGUS. Labs on 07/26/20 showed:Hg 15.4,M protein 0.4, IgA 683, kappalambda light chain ratio 3.71, beta-2 microglobulin 1.4. He presents to the clinic todayfor follow-up.   He reports no new problems or concerns he has been staying healthy.  He had his Covid booster vaccine.  ALLERGIES:  has No Known Allergies.  MEDICATIONS:  Current Outpatient Medications  Medication Sig Dispense Refill  . aluminum chloride (DRYSOL) 20 % external solution Apply 1 application topically at bedtime.    Marland Kitchen aspirin EC 81 MG tablet Take 81 mg by mouth daily.    . calcium-vitamin D (OSCAL WITH D) 250-125 MG-UNIT tablet Take 1 tablet by mouth daily.    . diclofenac sodium (VOLTAREN) 1 % GEL Apply 2 g topically as needed.    Marland Kitchen ibuprofen (ADVIL,MOTRIN) 200 MG tablet Take 200 mg by mouth every 6 (six) hours as needed for moderate pain.    . Multiple Vitamin (MULTIVITAMIN) tablet Take 1 tablet by mouth daily.    . naftifine (NAFTIN) 1 % cream Apply 1 application topically daily.    . Omega-3 Fatty Acids (FISH OIL) 1360 MG CAPS Take 1 capsule by mouth daily.    Marland Kitchen terbinafine (LAMISIL) 1 % cream Apply 1 application topically 2 (two) times daily.    Marland Kitchen triamcinolone (KENALOG) 0.025 % cream Apply 1 application topically 2 (two) times daily.     No current facility-administered medications for this visit.    PHYSICAL EXAMINATION: ECOG PERFORMANCE STATUS: 1 - Symptomatic but completely ambulatory  Vitals:   08/02/20 0928  BP: (!) 149/85  Pulse: 76  Resp: 18  Temp: 97.6 F (36.4 C)  SpO2: 100%   Filed Weights   08/02/20 0928  Weight: 223 lb 12.8 oz (101.5 kg)    LABORATORY DATA:  I have reviewed the data  as listed CMP Latest Ref Rng & Units 07/26/2020 07/20/2019 09/30/2018  Glucose 70 - 99 mg/dL 112(H) 94 105(H)  BUN 6 - 20 mg/dL 15 20 18   Creatinine 0.61 - 1.24 mg/dL 1.13 1.13 0.98  Sodium 135 - 145 mmol/L 140 138 138  Potassium 3.5 - 5.1 mmol/L 4.1 4.5 4.2  Chloride 98 - 111 mmol/L 108 103 105  CO2 22 - 32 mmol/L 24 25 23   Calcium 8.9 - 10.3 mg/dL 9.0 9.8 9.3  Total Protein 6.5 - 8.1 g/dL 7.2 8.0 7.3  Total Bilirubin 0.3 - 1.2 mg/dL 0.4 0.4 0.6  Alkaline Phos 38 - 126 U/L 57 59 56  AST 15 - 41 U/L 21 26 36  ALT 0 - 44 U/L 26 43 58(H)    Lab Results  Component Value Date   WBC 4.3 07/26/2020   HGB 15.4 07/26/2020   HCT 46.3 07/26/2020   MCV 86.1 07/26/2020   PLT 198 07/26/2020   NEUTROABS 2.0 07/26/2020    ASSESSMENT & PLAN:  MGUS (monoclonal gammopathy of unknown significance) IgA kappa MGUS: Diagnosed 04/17/2017 07/03/17: tProt 7.4, Alb 4.3, Ca 9.6, Cr 1.2, AP 55, LDH 147, beta-2 microglobulin ...; SPEP -- M-spike 0.2g/dL, SIFE -- monoclonal IgA kappa; IgG 722, IgA 565, IgM 60; kappa 45.3, lambda 10.8, KLR 4.19; WBC 3.0, Hgb 14.8, Plt 159;   PET-CT, 07/22/18:  No evidence of metabolically active malignancy, no evidence of lytic skeletal lesions.  BM Bx, 07/23/18: Pathology -- normocellular bone marrow with monoclonal plasma cell population measuring 5-10% of total cellularity. FlowCyto --no aberrant monoclonal populations of lymphocytes, no elevation of blasts. CytoGen & FISH --no abnormal findings.  Lab review  09/30/18: Hb 15.9, M protein: 0.5 gm (was 0.4 gm) Kappa 46.8, lambda 10.9, ratio 4.29 was previously 4.34 Beta 2 microglobulin: 1.7 07/20/2019: Hemoglobin 16.4, M protein 0.5 g IgA kappa, kappa 96.9, lambda 11.8, ratio 8.21, beta-2 microglobulin 1.5 07/26/2020: Hb 15.4, M-Protein: 0.4 IgA Kappa, Kappa: 47, Lambda: 12.7, Ratio: 3.71, Beta2MG : 1.4, IgA 683  Stable MGUS. RTC in 1 year with labs    No orders of the defined types were placed in this encounter.  The  patient has a good understanding of the overall plan. he agrees with it. he will call with any problems that may develop before the next visit here.  Total time spent: 20 mins including face to face time and time spent for planning, charting and coordination of care  Nicholas Lose, MD 08/02/2020  I, Cloyde Reams Dorshimer, am acting as scribe for Dr. Nicholas Lose.  I have reviewed the above documentation for accuracy and completeness, and I agree with the above.

## 2020-08-02 ENCOUNTER — Other Ambulatory Visit: Payer: Self-pay

## 2020-08-02 ENCOUNTER — Telehealth: Payer: Self-pay | Admitting: Hematology and Oncology

## 2020-08-02 ENCOUNTER — Inpatient Hospital Stay (HOSPITAL_BASED_OUTPATIENT_CLINIC_OR_DEPARTMENT_OTHER): Payer: BC Managed Care – PPO | Admitting: Hematology and Oncology

## 2020-08-02 DIAGNOSIS — D472 Monoclonal gammopathy: Secondary | ICD-10-CM

## 2020-08-02 DIAGNOSIS — Z7982 Long term (current) use of aspirin: Secondary | ICD-10-CM | POA: Diagnosis not present

## 2020-08-02 DIAGNOSIS — Z791 Long term (current) use of non-steroidal anti-inflammatories (NSAID): Secondary | ICD-10-CM | POA: Diagnosis not present

## 2020-08-02 NOTE — Assessment & Plan Note (Signed)
IgA kappa MGUS: Diagnosed 04/17/2017 07/03/17: tProt 7.4, Alb 4.3, Ca 9.6, Cr 1.2, AP 55, LDH 147, beta-2 microglobulin ...; SPEP -- M-spike 0.2g/dL, SIFE -- monoclonal IgA kappa; IgG 722, IgA 565, IgM 60; kappa 45.3, lambda 10.8, KLR 4.19; WBC 3.0, Hgb 14.8, Plt 159;   PET-CT, 07/22/18: No evidence of metabolically active malignancy, no evidence of lytic skeletal lesions.  BM Bx, 07/23/18: Pathology -- normocellular bone marrow with monoclonal plasma cell population measuring 5-10% of total cellularity. FlowCyto --no aberrant monoclonal populations of lymphocytes, no elevation of blasts. CytoGen & FISH --no abnormal findings.  Lab review  09/30/18: Hb 15.9, M protein: 0.5 gm (was 0.4 gm) Kappa 46.8, lambda 10.9, ratio 4.29 was previously 4.34 Beta 2 microglobulin: 1.7 07/20/2019: Hemoglobin 16.4, M protein 0.5 g IgA kappa, kappa 96.9, lambda 11.8, ratio 8.21, beta-2 microglobulin 1.5 07/26/2020: Hb 15.4, M-Protein: 0.4 IgA Kappa, Kappa: 47, Lambda: 12.7, Ratio: 3.71, Beta2MG : 1.4, IgA 683  Stable MGUS. RTC in 1 year with labs

## 2020-08-02 NOTE — Telephone Encounter (Signed)
Scheduled appts per 12/15 los. Gave pt a print out of AVS.

## 2020-10-10 DIAGNOSIS — N400 Enlarged prostate without lower urinary tract symptoms: Secondary | ICD-10-CM | POA: Diagnosis not present

## 2020-10-18 DIAGNOSIS — N400 Enlarged prostate without lower urinary tract symptoms: Secondary | ICD-10-CM | POA: Diagnosis not present

## 2020-10-18 DIAGNOSIS — N5201 Erectile dysfunction due to arterial insufficiency: Secondary | ICD-10-CM | POA: Diagnosis not present

## 2020-10-18 DIAGNOSIS — R972 Elevated prostate specific antigen [PSA]: Secondary | ICD-10-CM | POA: Diagnosis not present

## 2020-11-15 DIAGNOSIS — F4320 Adjustment disorder, unspecified: Secondary | ICD-10-CM | POA: Diagnosis not present

## 2021-01-18 ENCOUNTER — Telehealth: Payer: Self-pay | Admitting: Hematology and Oncology

## 2021-01-18 NOTE — Telephone Encounter (Signed)
R/s per prov pal, per 12/15 los, pt aware

## 2021-07-16 ENCOUNTER — Inpatient Hospital Stay: Payer: BC Managed Care – PPO | Attending: Hematology and Oncology

## 2021-07-16 ENCOUNTER — Other Ambulatory Visit: Payer: Self-pay

## 2021-07-16 DIAGNOSIS — D472 Monoclonal gammopathy: Secondary | ICD-10-CM | POA: Diagnosis not present

## 2021-07-16 LAB — CMP (CANCER CENTER ONLY)
ALT: 38 U/L (ref 0–44)
AST: 23 U/L (ref 15–41)
Albumin: 4.2 g/dL (ref 3.5–5.0)
Alkaline Phosphatase: 58 U/L (ref 38–126)
Anion gap: 10 (ref 5–15)
BUN: 15 mg/dL (ref 8–23)
CO2: 22 mmol/L (ref 22–32)
Calcium: 9.2 mg/dL (ref 8.9–10.3)
Chloride: 106 mmol/L (ref 98–111)
Creatinine: 0.9 mg/dL (ref 0.61–1.24)
GFR, Estimated: >60 mL/min (ref >60)
Glucose, Bld: 95 mg/dL (ref 70–99)
Potassium: 4.1 mmol/L (ref 3.5–5.1)
Sodium: 138 mmol/L (ref 135–145)
Total Bilirubin: 0.6 mg/dL (ref 0.3–1.2)
Total Protein: 7.3 g/dL (ref 6.5–8.1)

## 2021-07-16 LAB — CBC WITH DIFFERENTIAL (CANCER CENTER ONLY)
Abs Immature Granulocytes: 0.02 10*3/uL (ref 0.00–0.07)
Basophils Absolute: 0 10*3/uL (ref 0.0–0.1)
Basophils Relative: 1 %
Eosinophils Absolute: 0.3 10*3/uL (ref 0.0–0.5)
Eosinophils Relative: 6 %
HCT: 46.4 % (ref 39.0–52.0)
Hemoglobin: 16.1 g/dL (ref 13.0–17.0)
Immature Granulocytes: 0 %
Lymphocytes Relative: 31 %
Lymphs Abs: 1.5 10*3/uL (ref 0.7–4.0)
MCH: 29.5 pg (ref 26.0–34.0)
MCHC: 34.7 g/dL (ref 30.0–36.0)
MCV: 85 fL (ref 80.0–100.0)
Monocytes Absolute: 0.5 10*3/uL (ref 0.1–1.0)
Monocytes Relative: 10 %
Neutro Abs: 2.5 10*3/uL (ref 1.7–7.7)
Neutrophils Relative %: 52 %
Platelet Count: 184 10*3/uL (ref 150–400)
RBC: 5.46 MIL/uL (ref 4.22–5.81)
RDW: 12.9 % (ref 11.5–15.5)
WBC Count: 4.8 10*3/uL (ref 4.0–10.5)
nRBC: 0 % (ref 0.0–0.2)

## 2021-07-17 LAB — KAPPA/LAMBDA LIGHT CHAINS
Kappa free light chain: 103.8 mg/L — ABNORMAL HIGH (ref 3.3–19.4)
Kappa, lambda light chain ratio: 7.31 — ABNORMAL HIGH (ref 0.26–1.65)
Lambda free light chains: 14.2 mg/L (ref 5.7–26.3)

## 2021-07-17 LAB — BETA 2 MICROGLOBULIN, SERUM: Beta-2 Microglobulin: 1.8 mg/L (ref 0.6–2.4)

## 2021-07-18 ENCOUNTER — Other Ambulatory Visit: Payer: BC Managed Care – PPO

## 2021-07-20 LAB — MULTIPLE MYELOMA PANEL, SERUM
Albumin SerPl Elph-Mcnc: 3.8 g/dL (ref 2.9–4.4)
Albumin/Glob SerPl: 1.2 (ref 0.7–1.7)
Alpha 1: 0.2 g/dL (ref 0.0–0.4)
Alpha2 Glob SerPl Elph-Mcnc: 0.6 g/dL (ref 0.4–1.0)
B-Globulin SerPl Elph-Mcnc: 1.7 g/dL — ABNORMAL HIGH (ref 0.7–1.3)
Gamma Glob SerPl Elph-Mcnc: 0.8 g/dL (ref 0.4–1.8)
Globulin, Total: 3.2 g/dL (ref 2.2–3.9)
IgA: 916 mg/dL — ABNORMAL HIGH (ref 61–437)
IgG (Immunoglobin G), Serum: 804 mg/dL (ref 603–1613)
IgM (Immunoglobulin M), Srm: 54 mg/dL (ref 20–172)
M Protein SerPl Elph-Mcnc: 0.6 g/dL — ABNORMAL HIGH
Total Protein ELP: 7 g/dL (ref 6.0–8.5)

## 2021-07-22 NOTE — Progress Notes (Signed)
Patient Care Team: Lennie Odor, [place] as PCP - General (Nurse Practitioner)  DIAGNOSIS:    ICD-10-CM   1. MGUS (monoclonal gammopathy of unknown significance)  D47.2       CHIEF COMPLIANT: Follow-up of MGUS to review labs  INTERVAL HISTORY: Anthony [name] is a 60 y.o. with above-mentioned history of MGUS. Labs on 07/16/2021 showed: Hg 16.1, M protein 0.6, IgA 916, kappa lambda light chain ratio 7.31. He presents to the clinic today for follow-up.  He reports no new problems or concerns with his health.  He had blood work a week ago and is here to discuss results.  He is accompanied by his wife.  ALLERGIES:  has No Known Allergies.  MEDICATIONS:  Current Outpatient Medications  Medication Sig Dispense Refill   aluminum chloride (DRYSOL) 20 % external solution Apply 1 application topically at bedtime.     aspirin EC 81 MG tablet Take 81 mg by mouth daily.     calcium-vitamin D (OSCAL WITH D) 250-125 MG-UNIT tablet Take 1 tablet by mouth daily.     diclofenac sodium (VOLTAREN) 1 % GEL Apply 2 g topically as needed.     ibuprofen (ADVIL,MOTRIN) 200 MG tablet Take 200 mg by mouth every 6 (six) hours as needed for moderate pain.     Multiple Vitamin (MULTIVITAMIN) tablet Take 1 tablet by mouth daily.     Omega-3 Fatty Acids (FISH OIL) 1360 MG CAPS Take 1 capsule by mouth daily.     terbinafine (LAMISIL) 1 % cream Apply 1 application topically 2 (two) times daily.     triamcinolone (KENALOG) 0.025 % cream Apply 1 application topically 2 (two) times daily.     No current facility-administered medications for this visit.    PHYSICAL EXAMINATION: ECOG PERFORMANCE STATUS: 1 - Symptomatic but completely ambulatory  Vitals:   07/23/21 0840  BP: (!) 149/81  Pulse: 76  Resp: 18  Temp: (!) 97.5 F (36.4 C)  SpO2: 100%   Filed Weights   07/23/21 0840  Weight: 222 lb 8 oz (100.9 kg)    LABORATORY DATA:  I have reviewed the data as listed CMP Latest Ref Rng & Units 07/16/2021  07/26/2020 07/20/2019  Glucose 70 - 99 mg/dL 95 112(H) 94  BUN 8 - 23 mg/dL 15 15 20   Creatinine 0.61 - 1.24 mg/dL 0.90 1.13 1.13  Sodium 135 - 145 mmol/L 138 140 138  Potassium 3.5 - 5.1 mmol/L 4.1 4.1 4.5  Chloride 98 - 111 mmol/L 106 108 103  CO2 22 - 32 mmol/L 22 24 25   Calcium 8.9 - 10.3 mg/dL 9.2 9.0 9.8  Total Protein 6.5 - 8.1 g/dL 7.3 7.2 8.0  Total Bilirubin 0.3 - 1.2 mg/dL 0.6 0.4 0.4  Alkaline Phos 38 - 126 U/L 58 57 59  AST 15 - 41 U/L 23 21 26   ALT 0 - 44 U/L 38 26 43    Lab Results  Component Value Date   WBC 4.8 07/16/2021   HGB 16.1 07/16/2021   HCT 46.4 07/16/2021   MCV 85.0 07/16/2021   PLT 184 07/16/2021   NEUTROABS 2.5 07/16/2021    ASSESSMENT & PLAN:  MGUS (monoclonal gammopathy of unknown significance) IgA kappa MGUS: Diagnosed 04/17/2017 07/03/17: tProt 7.4, Alb 4.3, Ca 9.6, Cr 1.2, AP 55, LDH 147, beta-2 microglobulin ...; SPEP -- M-spike 0.2g/dL, SIFE -- monoclonal IgA kappa; IgG 722, IgA 565, IgM 60; kappa 45.3, lambda 10.8, KLR 4.19; WBC 3.0, Hgb 14.8, Plt 159;  PET-CT, 07/22/18: No evidence of metabolically active malignancy, no evidence of lytic skeletal lesions.   BM Bx, 07/23/18: Pathology -- normocellular bone marrow with monoclonal plasma cell population measuring 5-10% of total cellularity. FlowCyto --no aberrant monoclonal populations of lymphocytes, no elevation of blasts. CytoGen & FISH --no abnormal findings.   Lab review  09/30/18: Hb 15.9, M protein: 0.5 gm (was 0.4 gm) Kappa 46.8, lambda 10.9, ratio 4.29 was previously 4.34 Beta 2 microglobulin: 1.7 07/26/2020: Hb 15.4, M-Protein: 0.4 IgA Kappa, Kappa: 47, Lambda: 12.7, Ratio: 3.71, Beta2MG : 1.4, IgA 683 07/16/21: Hb 16.1, M Protein: 0.6 gm IgA Kappa: 103.8, Lambda 14.2, Ratio: 7.31, Beta 2MG : 1.8, Cr: 0.9  Labs are relatively stable. RTC in 1 year with labs done ahead of visit    No orders of the defined types were placed in this encounter.  The patient has a good understanding of  the overall plan. he agrees with it. he will call with any problems that may develop before the next visit here.  Total time spent: 20 mins including face to face time and time spent for planning, charting and coordination of care  Rulon Eisenmenger, MD, MPH 07/23/2021  I, Thana Ates, am acting as scribe for Dr. Nicholas Lose.  I have reviewed the above documentation for accuracy and completeness, and I agree with the above.

## 2021-07-22 NOTE — Assessment & Plan Note (Signed)
IgA kappa MGUS: Diagnosed 04/17/2017 07/03/17: tProt 7.4, Alb 4.3, Ca 9.6, Cr 1.2, AP 55, LDH 147, beta-2 microglobulin ...; SPEP -- M-spike 0.2g/dL, SIFE -- monoclonal IgA kappa; IgG 722, IgA 565, IgM 60; kappa 45.3, lambda 10.8, KLR 4.19; WBC 3.0, Hgb 14.8, Plt 159;   PET-CT, 07/22/18: No evidence of metabolically active malignancy, no evidence of lytic skeletal lesions.  BM Bx, 07/23/18: Pathology -- normocellular bone marrow with monoclonal plasma cell population measuring 5-10% of total cellularity. FlowCyto --no aberrant monoclonal populations of lymphocytes, no elevation of blasts. CytoGen & FISH --no abnormal findings.  Lab review  09/30/18: Hb 15.9, M protein: 0.5 gm (was 0.4 gm) Kappa 46.8, lambda 10.9, ratio 4.29 was previously 4.34 Beta 2 microglobulin: 1.7 07/26/2020: Hb 15.4, M-Protein: 0.4 IgA Kappa, Kappa: 47, Lambda: 12.7, Ratio: 3.71, Beta2MG : 1.4, IgA 683 07/16/21: Hb 16.1, M Protein: 0.6 gm IgA Kappa: 103.8, Lambda 14.2, Ratio: 7.31, Beta 2MG : 1.8, Cr: 0.9  Labs are relatively stable. RTC in 1 year with labs done ahead of visit

## 2021-07-23 ENCOUNTER — Inpatient Hospital Stay: Payer: BC Managed Care – PPO | Attending: Hematology and Oncology | Admitting: Hematology and Oncology

## 2021-07-23 ENCOUNTER — Other Ambulatory Visit: Payer: Self-pay

## 2021-07-23 DIAGNOSIS — D472 Monoclonal gammopathy: Secondary | ICD-10-CM | POA: Diagnosis not present

## 2021-07-25 ENCOUNTER — Ambulatory Visit: Payer: BC Managed Care – PPO | Admitting: Hematology and Oncology

## 2021-10-05 DIAGNOSIS — R972 Elevated prostate specific antigen [PSA]: Secondary | ICD-10-CM | POA: Diagnosis not present

## 2021-10-19 DIAGNOSIS — N5201 Erectile dysfunction due to arterial insufficiency: Secondary | ICD-10-CM | POA: Diagnosis not present

## 2021-10-19 DIAGNOSIS — N400 Enlarged prostate without lower urinary tract symptoms: Secondary | ICD-10-CM | POA: Diagnosis not present

## 2021-10-19 DIAGNOSIS — R972 Elevated prostate specific antigen [PSA]: Secondary | ICD-10-CM | POA: Diagnosis not present

## 2021-11-09 ENCOUNTER — Other Ambulatory Visit: Payer: Self-pay

## 2021-11-09 ENCOUNTER — Emergency Department (HOSPITAL_BASED_OUTPATIENT_CLINIC_OR_DEPARTMENT_OTHER): Payer: BC Managed Care – PPO | Admitting: Radiology

## 2021-11-09 ENCOUNTER — Other Ambulatory Visit (HOSPITAL_BASED_OUTPATIENT_CLINIC_OR_DEPARTMENT_OTHER): Payer: Self-pay

## 2021-11-09 ENCOUNTER — Encounter (HOSPITAL_BASED_OUTPATIENT_CLINIC_OR_DEPARTMENT_OTHER): Payer: Self-pay

## 2021-11-09 ENCOUNTER — Emergency Department (HOSPITAL_BASED_OUTPATIENT_CLINIC_OR_DEPARTMENT_OTHER)
Admission: EM | Admit: 2021-11-09 | Discharge: 2021-11-09 | Disposition: A | Discharge status: Home / Self Care | Payer: BC Managed Care – PPO | Attending: Emergency Medicine | Admitting: Emergency Medicine

## 2021-11-09 DIAGNOSIS — W010XXA Fall on same level from slipping, tripping and stumbling without subsequent striking against object, initial encounter: Secondary | ICD-10-CM | POA: Insufficient documentation

## 2021-11-09 DIAGNOSIS — S42211A Unspecified displaced fracture of surgical neck of right humerus, initial encounter for closed fracture: Secondary | ICD-10-CM | POA: Diagnosis not present

## 2021-11-09 DIAGNOSIS — Z7982 Long term (current) use of aspirin: Secondary | ICD-10-CM | POA: Diagnosis not present

## 2021-11-09 DIAGNOSIS — M25511 Pain in right shoulder: Secondary | ICD-10-CM | POA: Diagnosis not present

## 2021-11-09 DIAGNOSIS — S40911A Unspecified superficial injury of right shoulder, initial encounter: Secondary | ICD-10-CM | POA: Diagnosis not present

## 2021-11-09 MED ORDER — ONDANSETRON 4 MG PO TBDP
4.0000 mg | ORAL_TABLET | Freq: Three times a day (TID) | ORAL | 0 refills | Status: DC | PRN
  Filled 2021-11-09: qty 10, 4d supply, fill #0

## 2021-11-09 MED ORDER — OXYCODONE-ACETAMINOPHEN 5-325 MG PO TABS
1.0000 | ORAL_TABLET | Freq: Four times a day (QID) | ORAL | 0 refills | Status: DC | PRN
Start: 2021-11-09 — End: 2022-02-06
  Filled 2021-11-09: qty 15, 4d supply, fill #0

## 2021-11-09 NOTE — Discharge Instructions (Signed)
Call the orthopedic group within 1 or 2 days to schedule an appointment for this week. ? ?Keep the splint in place. ? ?Return back to the ER if you have worsening pain fevers or any additional concerns. ?

## 2021-11-09 NOTE — ED Notes (Signed)
RN provided AVS using Teachback Method. Patient verbalizes understanding of Discharge Instructions. Opportunity for Questioning and Answers were provided by RN. Patient Discharged from ED ambulatory to Home via Family. 

## 2021-11-09 NOTE — ED Triage Notes (Signed)
Pt reports falling from a standing position while lunging forward to catch his cat, realized he was going to fall, rolled toward his Right side landing on his Right shoulder onto the hard floor floor ? ? ?Nausea, dizziness and diaphoretic during the night. Some relief with Ibuprofen and make shift sling he made at home. Pt reports and slight change in placement of his arm caused increase pain.  ?

## 2021-11-09 NOTE — ED Notes (Signed)
Patient requested a Prescription for Nausea PRN. Almyra Free, MD advised and Medication to be sent to Pharmacy. Patient informed of Same. ?

## 2021-11-09 NOTE — ED Provider Notes (Signed)
?[facility] ?Provider Note ? ? ?CSN: [id number] ?Arrival date & time: 11/09/21  8144 ? ?  ? ?History ? ?Chief Complaint  ?Patient presents with  ? Fall  ? Shoulder Injury  ? ? ?Anthony [name] is a 61 y.o. male. ? ?Patient presents chief complaint of right shoulder pain.  He was standing up when his cat jumped and he was try to catch his cat but he lost his balance and fell onto his right shoulder.  Denies head injury or pain elsewhere complaining of pain with attempted range of motion of the right shoulder.  Denies any fevers or cough or vomiting or diarrhea no neck pain or back pain. ? ? ?  ? ?Home Medications ?Prior to Admission medications   ?Medication Sig Start Date End Date Taking? Authorizing Provider  ?aluminum chloride (DRYSOL) 20 % external solution Apply 1 application topically at bedtime.    [provider]  ?aspirin EC 81 MG tablet Take 81 mg by mouth daily.    [provider]  ?calcium-vitamin D (OSCAL WITH D) 250-125 MG-UNIT tablet Take 1 tablet by mouth daily.    [provider]  ?diclofenac sodium (VOLTAREN) 1 % GEL Apply 2 g topically as needed.    [provider]  ?ibuprofen (ADVIL,MOTRIN) 200 MG tablet Take 200 mg by mouth every 6 (six) hours as needed for moderate pain.    [provider]  ?Multiple Vitamin (MULTIVITAMIN) tablet Take 1 tablet by mouth daily.    [provider]  ?Omega-3 Fatty Acids (FISH OIL) 1360 MG CAPS Take 1 capsule by mouth daily.    [provider]  ?terbinafine (LAMISIL) 1 % cream Apply 1 application topically 2 (two) times daily.    [provider]  ?triamcinolone (KENALOG) 0.025 % cream Apply 1 application topically 2 (two) times daily.    [provider]  ?   ? ?Allergies    ?Patient has no known allergies.   ? ?Review of Systems   ?Review of Systems  ?Constitutional:  Negative for fever.  ?HENT:  Negative for ear pain and sore throat.   ?Eyes:  Negative for  pain.  ?Respiratory:  Negative for cough.   ?Cardiovascular:  Negative for chest pain.  ?Gastrointestinal:  Negative for abdominal pain.  ?Genitourinary:  Negative for flank pain.  ?Musculoskeletal:  Negative for back pain.  ?Skin:  Negative for color change and rash.  ?Neurological:  Negative for syncope.  ?All other systems reviewed and are negative. ? ?Physical Exam ?Updated Vital Signs ?BP 135/88 (BP Location: Right Arm)   Pulse 72   Temp 98.4 ?F (36.9 ?C) (Oral)   Resp 18   Ht '5\' 6"'$  (1.676 m)   Wt 93 kg   SpO2 100%   BMI 33.09 kg/m?  ?Physical Exam ?Constitutional:   ?   Appearance: He is well-developed.  ?HENT:  ?   Head: Normocephalic.  ?   Nose: Nose normal.  ?Eyes:  ?   Extraocular Movements: Extraocular movements intact.  ?Cardiovascular:  ?   Rate and Rhythm: Normal rate.  ?Pulmonary:  ?   Effort: Pulmonary effort is normal.  ?Musculoskeletal:  ?   Comments: Tenderness palpation to the right shoulder, no obvious deformity on exam.  Pain with any attempted range of motion of the right shoulder.  Otherwise neurovascularly intact right upper extremity compartments are soft.  No pain with range of motion of the right elbow or wrist no snuffbox tenderness noted.  ?Skin: ?  Coloration: Skin is not jaundiced.  ?Neurological:  ?   General: No focal deficit present.  ?   Mental Status: He is alert. Mental status is at baseline.  ? ? ?ED Results / Procedures / Treatments   ?Labs ?(all labs ordered are listed, but only abnormal results are displayed) ?Labs Reviewed - No data to display ? ?EKG ?None ? ?Radiology ?DG Shoulder Right ? ?Result Date: 11/09/2021 ?CLINICAL DATA:  Patient fell and is having right-sided shoulder pain. Injury. EXAM: RIGHT SHOULDER - 2+ VIEW COMPARISON:  None. FINDINGS: There is diffuse decreased bone mineralization. There is a transverse linear lucency within the proximal humeral surgical neck and curvilinear lucency within the greater tuberosity indicating an acute fracture. There is  up to 8 mm cortical step-off at the medial aspect of the proximal humeral metaphysis with the distal fracture component apparently impacted and mildly anteriorly displaced with respect to the proximal fracture component accounting for the cortical step-off. Otherwise, no significant lateral displacement is seen of the distal fracture component. There also may be a fracture line lucency within the medial subcortical humeral head. Minimal inferior subluxation of the humeral head with respect to the glenoid, however no dislocation is seen. Mild acromioclavicular joint space narrowing and peripheral osteophytosis. IMPRESSION: Acute, mildly impacted fracture of the proximal humeral surgical neck. Additional nondisplaced fractures of the greater tuberosity and medial subcortical humeral head. Electronically Signed   By: Yvonne Kendall M.D.   On: 11/09/2021 11:18   ? ?Procedures ?[facility]Or[facility] Injury Treatment ? ?Date/Time: 11/09/2021 11:46 AM ?Performed by: Luna Fuse, MD ?Authorized by: Luna Fuse, MD  ?Immobilization: sling ?Post-procedure neurovascular assessment: post-procedure neurovascularly intact ?Comments: Neurovascularly intact after placement of sling. ? ?  ? ? ?Medications Ordered in ED ?Medications - No data to display ? ?ED Course/ Medical Decision Making/ A&P ?  ?                        ?Medical Decision Making ?Amount and/or Complexity of Data Reviewed ?Radiology: ordered. ? ? ?Chart review shows primary care office visit for monoclonal gammopathy on December 2022. ? ?History obtained from the patient and his wife at bedside. ? ?Anthony [name] states include x-rays of the shoulder showing fracture of the right humeral head. ? ?Patient placed in a right upper extremity shoulder immobilizer.  Given recommendation to follow-up with orthopedic surgery within the next 4 to 5 days.  Advising immediate return for worsening pain fevers or any additional concerns. ? ? ? ? ? ? ? ?Final Clinical Impression(s) / ED  Diagnoses ?Final diagnoses:  ?Closed displaced fracture of surgical neck of right humerus, unspecified fracture morphology, initial encounter  ? ? ?Rx / DC Orders ?ED Discharge Orders   ? ? None  ? ?  ? ? ?  ?Luna Fuse, MD ?11/09/21 1147 ? ?

## 2021-11-13 ENCOUNTER — Ambulatory Visit
Admission: RE | Admit: 2021-11-13 | Discharge: 2021-11-13 | Disposition: A | Discharge status: Home / Self Care | Payer: BC Managed Care – PPO | Source: Ambulatory Visit | Attending: Specialist | Admitting: Specialist

## 2021-11-13 ENCOUNTER — Other Ambulatory Visit: Payer: Self-pay | Admitting: Specialist

## 2021-11-13 DIAGNOSIS — M25511 Pain in right shoulder: Secondary | ICD-10-CM

## 2021-11-13 DIAGNOSIS — D171 Benign lipomatous neoplasm of skin and subcutaneous tissue of trunk: Secondary | ICD-10-CM | POA: Diagnosis not present

## 2021-11-13 DIAGNOSIS — S42211A Unspecified displaced fracture of surgical neck of right humerus, initial encounter for closed fracture: Secondary | ICD-10-CM | POA: Diagnosis not present

## 2021-11-13 DIAGNOSIS — R928 Other abnormal and inconclusive findings on diagnostic imaging of breast: Secondary | ICD-10-CM | POA: Diagnosis not present

## 2021-11-15 ENCOUNTER — Encounter: Payer: Self-pay | Admitting: Hematology and Oncology

## 2021-11-16 ENCOUNTER — Other Ambulatory Visit: Payer: Self-pay

## 2021-11-16 DIAGNOSIS — D472 Monoclonal gammopathy: Secondary | ICD-10-CM

## 2021-11-23 DIAGNOSIS — M25511 Pain in right shoulder: Secondary | ICD-10-CM | POA: Diagnosis not present

## 2021-11-23 DIAGNOSIS — S42201D Unspecified fracture of upper end of right humerus, subsequent encounter for fracture with routine healing: Secondary | ICD-10-CM | POA: Diagnosis not present

## 2021-12-07 DIAGNOSIS — S42201D Unspecified fracture of upper end of right humerus, subsequent encounter for fracture with routine healing: Secondary | ICD-10-CM | POA: Diagnosis not present

## 2021-12-21 DIAGNOSIS — M25511 Pain in right shoulder: Secondary | ICD-10-CM | POA: Diagnosis not present

## 2021-12-21 DIAGNOSIS — S42201D Unspecified fracture of upper end of right humerus, subsequent encounter for fracture with routine healing: Secondary | ICD-10-CM | POA: Diagnosis not present

## 2021-12-25 ENCOUNTER — Ambulatory Visit (HOSPITAL_COMMUNITY)
Admission: RE | Admit: 2021-12-25 | Discharge: 2021-12-25 | Disposition: A | Discharge status: Home / Self Care | Payer: BC Managed Care – PPO | Source: Ambulatory Visit | Attending: Hematology and Oncology | Admitting: Hematology and Oncology

## 2021-12-25 DIAGNOSIS — D472 Monoclonal gammopathy: Secondary | ICD-10-CM | POA: Diagnosis not present

## 2021-12-26 ENCOUNTER — Other Ambulatory Visit: Payer: Self-pay | Admitting: Hematology and Oncology

## 2021-12-26 ENCOUNTER — Telehealth: Payer: Self-pay | Admitting: Hematology and Oncology

## 2021-12-26 DIAGNOSIS — D472 Monoclonal gammopathy: Secondary | ICD-10-CM

## 2021-12-26 NOTE — Telephone Encounter (Signed)
I discussed the results of bone survey showing indeterminate uptake in the humerus bilaterally. ?We would like to obtain serum protein electrophoresis, CMP CBC and kappa lambda ratio. ?I would like to repeat a PET CT scan in a month ?Follow-up after that with a telephone visit to discuss results. ? ?If the monoclonal protein is significantly increased with additional lesions detected on the PET scan then we would need to repeat the bone marrow biopsy to confirm myeloma and proceed with treatment. ?

## 2021-12-28 ENCOUNTER — Telehealth: Payer: Self-pay | Admitting: Hematology and Oncology

## 2021-12-28 NOTE — Telephone Encounter (Signed)
.  Called pt per 5/12 inbasket , Patient was unavailable, a message with appt time and date was left with number on file.   ?

## 2022-01-03 DIAGNOSIS — M25511 Pain in right shoulder: Secondary | ICD-10-CM | POA: Diagnosis not present

## 2022-01-03 DIAGNOSIS — M25611 Stiffness of right shoulder, not elsewhere classified: Secondary | ICD-10-CM | POA: Diagnosis not present

## 2022-01-09 DIAGNOSIS — M25611 Stiffness of right shoulder, not elsewhere classified: Secondary | ICD-10-CM | POA: Diagnosis not present

## 2022-01-11 DIAGNOSIS — M25611 Stiffness of right shoulder, not elsewhere classified: Secondary | ICD-10-CM | POA: Diagnosis not present

## 2022-01-16 DIAGNOSIS — M25611 Stiffness of right shoulder, not elsewhere classified: Secondary | ICD-10-CM | POA: Diagnosis not present

## 2022-01-16 NOTE — Progress Notes (Incomplete)
HEMATOLOGY-ONCOLOGY TELEPHONE VISIT PROGRESS NOTE  I connected with '@PTNAME'$ @ on 01/16/22 at  8:15 AM EDT by telephone and verified that I am speaking with the correct person using two identifiers.  I discussed the limitations, risks, security and privacy concerns of performing an evaluation and management service by telephone and the availability of in person appointments.  I also discussed with the patient that there may be a patient responsible charge related to this service. The patient expressed understanding and agreed to proceed.   History of Present Illness: Anthony [name] is a 61 y.o. with above-mentioned history of MGUS. He presents to the clinic today for via telephone follow-up.  Oncology History   No history exists.    REVIEW OF SYSTEMS:   Constitutional: Denies fevers, chills or abnormal weight loss Eyes: Denies blurriness of vision Ears, nose, mouth, throat, and face: Denies mucositis or sore throat Respiratory: Denies cough, dyspnea or wheezes Cardiovascular: Denies palpitation, chest discomfort Gastrointestinal:  Denies nausea, heartburn or change in bowel habits Skin: Denies abnormal skin rashes Lymphatics: Denies new lymphadenopathy or easy bruising Neurological:Denies numbness, tingling or new weaknesses Behavioral/Psych: Mood is stable, no new changes  Extremities: No lower extremity edema Breast: *** denies any pain or lumps or nodules in either breasts All other systems were reviewed with the patient and are negative. Observations/Objective:     Assessment Plan:  No problem-specific Assessment & Plan notes found for this encounter.    I discussed the assessment and treatment plan with the patient. The patient was provided an opportunity to ask questions and all were answered. The patient agreed with the plan and demonstrated an understanding of the instructions. The patient was advised to call back or seek an in-person evaluation if the symptoms worsen or if  the condition fails to improve as anticipated.   I provided *** minutes of non-face-to-face time during this encounter. Hjalmar Ballengee Bryson Ha, CMA I [place] [place] am scribing for Dr. Lindi Adie  ***

## 2022-01-17 ENCOUNTER — Inpatient Hospital Stay: Payer: BC Managed Care – PPO | Attending: Hematology and Oncology

## 2022-01-17 ENCOUNTER — Other Ambulatory Visit: Payer: Self-pay

## 2022-01-17 ENCOUNTER — Encounter: Payer: Self-pay | Admitting: Hematology and Oncology

## 2022-01-17 DIAGNOSIS — C9000 Multiple myeloma not having achieved remission: Secondary | ICD-10-CM | POA: Diagnosis not present

## 2022-01-17 DIAGNOSIS — D472 Monoclonal gammopathy: Secondary | ICD-10-CM

## 2022-01-17 LAB — CMP (CANCER CENTER ONLY)
ALT: 23 U/L (ref 0–44)
AST: 21 U/L (ref 15–41)
Albumin: 3.6 g/dL (ref 3.5–5.0)
Alkaline Phosphatase: 48 U/L (ref 38–126)
Anion gap: 12 (ref 5–15)
BUN: 18 mg/dL (ref 8–23)
CO2: 23 mmol/L (ref 22–32)
Calcium: 9.8 mg/dL (ref 8.9–10.3)
Chloride: 101 mmol/L (ref 98–111)
Creatinine: 1.07 mg/dL (ref 0.61–1.24)
GFR, Estimated: >60 mL/min (ref >60)
Glucose, Bld: 135 mg/dL — ABNORMAL HIGH (ref 70–99)
Potassium: 4 mmol/L (ref 3.5–5.1)
Sodium: 136 mmol/L (ref 135–145)
Total Bilirubin: 0.4 mg/dL (ref 0.3–1.2)
Total Protein: 9.9 g/dL — ABNORMAL HIGH (ref 6.5–8.1)

## 2022-01-17 LAB — CBC WITH DIFFERENTIAL (CANCER CENTER ONLY)
Abs Immature Granulocytes: 0.02 10*3/uL (ref 0.00–0.07)
Basophils Absolute: 0 10*3/uL (ref 0.0–0.1)
Basophils Relative: 0 %
Eosinophils Absolute: 0.1 10*3/uL (ref 0.0–0.5)
Eosinophils Relative: 2 %
HCT: 38.6 % — ABNORMAL LOW (ref 39.0–52.0)
Hemoglobin: 13 g/dL (ref 13.0–17.0)
Immature Granulocytes: 1 %
Lymphocytes Relative: 39 %
Lymphs Abs: 1.3 10*3/uL (ref 0.7–4.0)
MCH: 29.3 pg (ref 26.0–34.0)
MCHC: 33.7 g/dL (ref 30.0–36.0)
MCV: 87.1 fL (ref 80.0–100.0)
Monocytes Absolute: 0.4 10*3/uL (ref 0.1–1.0)
Monocytes Relative: 11 %
Neutro Abs: 1.6 10*3/uL — ABNORMAL LOW (ref 1.7–7.7)
Neutrophils Relative %: 47 %
Platelet Count: 177 10*3/uL (ref 150–400)
RBC: 4.43 MIL/uL (ref 4.22–5.81)
RDW: 14.7 % (ref 11.5–15.5)
WBC Count: 3.3 10*3/uL — ABNORMAL LOW (ref 4.0–10.5)
nRBC: 0 % (ref 0.0–0.2)

## 2022-01-18 DIAGNOSIS — M25611 Stiffness of right shoulder, not elsewhere classified: Secondary | ICD-10-CM | POA: Diagnosis not present

## 2022-01-18 DIAGNOSIS — M25511 Pain in right shoulder: Secondary | ICD-10-CM | POA: Diagnosis not present

## 2022-01-18 LAB — KAPPA/LAMBDA LIGHT CHAINS
Kappa free light chain: 48.9 mg/L — ABNORMAL HIGH (ref 3.3–19.4)
Kappa, lambda light chain ratio: 1.86 — ABNORMAL HIGH (ref 0.26–1.65)
Lambda free light chains: 26.3 mg/L (ref 5.7–26.3)

## 2022-01-18 LAB — BETA 2 MICROGLOBULIN, SERUM: Beta-2 Microglobulin: 4.6 mg/L — ABNORMAL HIGH (ref 0.6–2.4)

## 2022-01-22 LAB — MULTIPLE MYELOMA PANEL, SERUM
Albumin SerPl Elph-Mcnc: 3.7 g/dL (ref 2.9–4.4)
Albumin/Glob SerPl: 0.6 — ABNORMAL LOW (ref 0.7–1.7)
Alpha 1: 0.2 g/dL (ref 0.0–0.4)
Alpha2 Glob SerPl Elph-Mcnc: 0.4 g/dL (ref 0.4–1.0)
B-Globulin SerPl Elph-Mcnc: 5.3 g/dL — ABNORMAL HIGH (ref 0.7–1.3)
Gamma Glob SerPl Elph-Mcnc: 0.3 g/dL — ABNORMAL LOW (ref 0.4–1.8)
Globulin, Total: 6.2 g/dL — ABNORMAL HIGH (ref 2.2–3.9)
IgA: 5115 mg/dL — ABNORMAL HIGH (ref 61–437)
IgG (Immunoglobin G), Serum: 454 mg/dL — ABNORMAL LOW (ref 603–1613)
IgM (Immunoglobulin M), Srm: 32 mg/dL (ref 20–172)
M Protein SerPl Elph-Mcnc: 3.6 g/dL — ABNORMAL HIGH
Total Protein ELP: 9.9 g/dL — ABNORMAL HIGH (ref 6.0–8.5)

## 2022-01-23 NOTE — Progress Notes (Signed)
HEMATOLOGY-ONCOLOGY TELEPHONE VISIT PROGRESS NOTE  I connected with Anthony [name] on 01/30/22 at  8:30 AM EDT by telephone and verified that I am speaking with the correct person using two identifiers.  I discussed the limitations, risks, security and privacy concerns of performing an evaluation and management service by telephone and the availability of in person appointments.  I also discussed with the patient that there may be a patient responsible charge related to this service. The patient expressed understanding and agreed to proceed.   History of Present Illness:  Anthony [name] is a 61 y.o. with above-mentioned history of MGUS. He presents to the clinic today via telephone follow-up. He fell and broke his right shoulder. PET CT done 2 days ago has not been officially read. Blood work was done and he connected with me to discuss the results   REVIEW OF SYSTEMS:   Constitutional: Denies fevers, chills or abnormal weight loss All other systems were reviewed with the patient and are negative. Observations/Objective:     Assessment Plan:  Multiple myeloma ([place]) 07/03/17: tProt 7.4, Alb 4.3, Ca 9.6, Cr 1.2, AP 55, LDH 147, beta-2 microglobulin ...; SPEP -- M-spike 0.2g/dL, SIFE -- monoclonal IgA kappa; IgG 722, IgA 565, IgM 60; kappa 45.3, lambda 10.8, KLR 4.19; WBC 3.0, Hgb 14.8, Plt 159;    PET-CT, 07/22/18: No evidence of metabolically active malignancy, no evidence of lytic skeletal lesions.   BM Bx, 07/23/18: Pathology -- normocellular bone marrow with monoclonal plasma cell population measuring 5-10% of total cellularity. FlowCyto --no aberrant monoclonal populations of lymphocytes, no elevation of blasts. CytoGen & FISH --no abnormal findings.   Lab review  09/30/18: Hb 15.9, M protein: 0.5 gm (was 0.4 gm) Kappa 46.8, lambda 10.9, ratio 4.29 was previously 4.34 Beta 2 microglobulin: 1.7 07/26/2020: Hb 15.4, M-Protein: 0.4 IgA Kappa, Kappa: 47, Lambda: 12.7, Ratio: 3.71, Beta2MG:  1.4, IgA 683 07/16/21: Hb 16.1, M Protein: 0.6 gm IgA Kappa: 103.8, Lambda 14.2, Ratio: 7.31, Beta 2MG: 1.8, Cr: 0.9 01/17/22: Hb 13, WBC 3.3, Beta 2 Microalbumin: 4.6, Kappa: 48.9, Lambda 26, Ratio: 1.86, IgA: 5115, M Protein: 3.6 grams, Cr: 1.07, Ca: 9.8, T Prot: 9.9 gm  01/28/22 PET-CT Scan: Widespread bone lesions (not officially read)  Plan: 1. Bone Marrow Biopsy 2. Referral to Dr.Kale for Myeloma therapy     I discussed the assessment and treatment plan with the patient. The patient was provided an opportunity to ask questions and all were answered. The patient agreed with the plan and demonstrated an understanding of the instructions. The patient was advised to call back or seek an in-person evaluation if the symptoms worsen or if the condition fails to improve as anticipated.   I provided 15 minutes of non-face-to-face time during this encounter. Harriette Ohara, MD I [place] [facility] am scribing for Dr. Lindi Adie  I have reviewed the above documentation for accuracy and completeness, and I agree with the above.

## 2022-01-28 ENCOUNTER — Ambulatory Visit (HOSPITAL_COMMUNITY)
Admission: RE | Admit: 2022-01-28 | Discharge: 2022-01-28 | Disposition: A | Discharge status: Home / Self Care | Payer: BC Managed Care – PPO | Source: Ambulatory Visit | Attending: Hematology and Oncology | Admitting: Hematology and Oncology

## 2022-01-28 DIAGNOSIS — C9000 Multiple myeloma not having achieved remission: Secondary | ICD-10-CM | POA: Diagnosis not present

## 2022-01-28 DIAGNOSIS — M25511 Pain in right shoulder: Secondary | ICD-10-CM | POA: Diagnosis not present

## 2022-01-28 DIAGNOSIS — D472 Monoclonal gammopathy: Secondary | ICD-10-CM | POA: Diagnosis not present

## 2022-01-28 DIAGNOSIS — M25611 Stiffness of right shoulder, not elsewhere classified: Secondary | ICD-10-CM | POA: Diagnosis not present

## 2022-01-28 LAB — GLUCOSE, CAPILLARY: Glucose-Capillary: 92 mg/dL (ref 70–99)

## 2022-01-28 MED ORDER — FLUDEOXYGLUCOSE F - 18 (FDG) INJECTION
10.6800 | Freq: Once | INTRAVENOUS | Status: AC | PRN
  Administered 2022-01-28: 10.68 via INTRAVENOUS

## 2022-01-29 ENCOUNTER — Telehealth: Payer: BC Managed Care – PPO | Admitting: Hematology and Oncology

## 2022-01-30 ENCOUNTER — Inpatient Hospital Stay (HOSPITAL_BASED_OUTPATIENT_CLINIC_OR_DEPARTMENT_OTHER): Payer: BC Managed Care – PPO | Admitting: Hematology and Oncology

## 2022-01-30 ENCOUNTER — Other Ambulatory Visit: Payer: Self-pay | Admitting: *Deleted

## 2022-01-30 ENCOUNTER — Other Ambulatory Visit: Payer: Self-pay

## 2022-01-30 DIAGNOSIS — C9000 Multiple myeloma not having achieved remission: Secondary | ICD-10-CM

## 2022-01-30 DIAGNOSIS — D472 Monoclonal gammopathy: Secondary | ICD-10-CM

## 2022-01-30 NOTE — Progress Notes (Signed)
Per MD pt needing to have BM BX in our office.  Appt scheduled, pt notified and verbalized understanding.

## 2022-01-30 NOTE — Assessment & Plan Note (Signed)
07/03/17: tProt 7.4, Alb 4.3, Ca 9.6, Cr 1.2, AP 55, LDH 147, beta-2 microglobulin ...; SPEP -- M-spike 0.2g/dL, SIFE -- monoclonal IgA kappa; IgG 722, IgA 565, IgM 60; kappa 45.3, lambda 10.8, KLR 4.19; WBC 3.0, Hgb 14.8, Plt 159;   PET-CT, 07/22/18: No evidence of metabolically active malignancy, no evidence of lytic skeletal lesions.  BM Bx, 07/23/18: Pathology -- normocellular bone marrow with monoclonal plasma cell population measuring 5-10% of total cellularity. FlowCyto --no aberrant monoclonal populations of lymphocytes, no elevation of blasts. CytoGen & FISH --no abnormal findings.  Lab review  09/30/18: Hb 15.9, M protein: 0.5 gm (was 0.4 gm) Kappa 46.8, lambda 10.9, ratio 4.29 was previously 4.34 Beta 2 microglobulin: 1.7 07/26/2020: Hb 15.4, M-Protein: 0.4 IgA Kappa, Kappa: 47, Lambda: 12.7, Ratio: 3.71, Beta2MG: 1.4, IgA 683 07/16/21: Hb 16.1, M Protein: 0.6 gm IgA Kappa: 103.8, Lambda 14.2, Ratio: 7.31, Beta 2MG: 1.8, Cr: 0.9 01/17/22: Hb 13, WBC 3.3, Beta 2 Microalbumin: 4.6, Kappa: 48.9, Lambda 26, Ratio: 1.86, IgA: 5115, M Protein: 3.6 grams, Cr: 1.07, Ca: 9.8, T Prot: 9.9 gm  01/28/22 PET-CT Scan: Widespread bone lesions (not officially read)  Plan: 1. Bone Marrow Biopsy 2. Referral to Dr.Kale for Myeloma therapy

## 2022-01-30 NOTE — Progress Notes (Signed)
Orders for Lidocaine, CBC and CMP entered for pt having BMBX 6/21. Message sent to scheduling to have bx scheduled appropriately. Pt is aware.

## 2022-01-30 NOTE — Progress Notes (Signed)
Per MD request, orders placed for IR bone marrow biopsy.

## 2022-02-05 DIAGNOSIS — M25611 Stiffness of right shoulder, not elsewhere classified: Secondary | ICD-10-CM | POA: Diagnosis not present

## 2022-02-06 ENCOUNTER — Other Ambulatory Visit: Payer: Self-pay

## 2022-02-06 ENCOUNTER — Inpatient Hospital Stay: Payer: BC Managed Care – PPO

## 2022-02-06 ENCOUNTER — Inpatient Hospital Stay (HOSPITAL_BASED_OUTPATIENT_CLINIC_OR_DEPARTMENT_OTHER): Payer: BC Managed Care – PPO | Admitting: Adult Health

## 2022-02-06 ENCOUNTER — Other Ambulatory Visit: Payer: Self-pay | Admitting: Lab

## 2022-02-06 VITALS — BP 143/91 | HR 70 | Temp 98.3°F | Resp 16

## 2022-02-06 DIAGNOSIS — D72819 Decreased white blood cell count, unspecified: Secondary | ICD-10-CM | POA: Diagnosis not present

## 2022-02-06 DIAGNOSIS — D472 Monoclonal gammopathy: Secondary | ICD-10-CM

## 2022-02-06 DIAGNOSIS — C9000 Multiple myeloma not having achieved remission: Secondary | ICD-10-CM | POA: Diagnosis not present

## 2022-02-06 DIAGNOSIS — D7289 Other specified disorders of white blood cells: Secondary | ICD-10-CM | POA: Diagnosis not present

## 2022-02-06 DIAGNOSIS — D6489 Other specified anemias: Secondary | ICD-10-CM | POA: Diagnosis not present

## 2022-02-06 DIAGNOSIS — D4989 Neoplasm of unspecified behavior of other specified sites: Secondary | ICD-10-CM | POA: Diagnosis not present

## 2022-02-06 LAB — CBC WITH DIFFERENTIAL (CANCER CENTER ONLY)
Abs Immature Granulocytes: 0.03 10*3/uL (ref 0.00–0.07)
Basophils Absolute: 0 10*3/uL (ref 0.0–0.1)
Basophils Relative: 0 %
Eosinophils Absolute: 0.1 10*3/uL (ref 0.0–0.5)
Eosinophils Relative: 4 %
HCT: 34.2 % — ABNORMAL LOW (ref 39.0–52.0)
Hemoglobin: 11.4 g/dL — ABNORMAL LOW (ref 13.0–17.0)
Immature Granulocytes: 1 %
Lymphocytes Relative: 38 %
Lymphs Abs: 1.2 10*3/uL (ref 0.7–4.0)
MCH: 29.1 pg (ref 26.0–34.0)
MCHC: 33.3 g/dL (ref 30.0–36.0)
MCV: 87.2 fL (ref 80.0–100.0)
Monocytes Absolute: 0.4 10*3/uL (ref 0.1–1.0)
Monocytes Relative: 13 %
Neutro Abs: 1.4 10*3/uL — ABNORMAL LOW (ref 1.7–7.7)
Neutrophils Relative %: 44 %
Platelet Count: 168 10*3/uL (ref 150–400)
RBC: 3.92 MIL/uL — ABNORMAL LOW (ref 4.22–5.81)
RDW: 15.4 % (ref 11.5–15.5)
WBC Count: 3.1 10*3/uL — ABNORMAL LOW (ref 4.0–10.5)
nRBC: 0 % (ref 0.0–0.2)

## 2022-02-06 LAB — CMP (CANCER CENTER ONLY)
ALT: 16 U/L (ref 0–44)
AST: 17 U/L (ref 15–41)
Albumin: 3.2 g/dL — ABNORMAL LOW (ref 3.5–5.0)
Alkaline Phosphatase: 54 U/L (ref 38–126)
Anion gap: 9 (ref 5–15)
BUN: 13 mg/dL (ref 8–23)
CO2: 24 mmol/L (ref 22–32)
Calcium: 9.8 mg/dL (ref 8.9–10.3)
Chloride: 102 mmol/L (ref 98–111)
Creatinine: 1.1 mg/dL (ref 0.61–1.24)
GFR, Estimated: >60 mL/min (ref >60)
Glucose, Bld: 92 mg/dL (ref 70–99)
Potassium: 4.1 mmol/L (ref 3.5–5.1)
Sodium: 135 mmol/L (ref 135–145)
Total Bilirubin: 0.3 mg/dL (ref 0.3–1.2)
Total Protein: 10.1 g/dL — ABNORMAL HIGH (ref 6.5–8.1)

## 2022-02-06 MED ORDER — LIDOCAINE HCL 2 % IJ SOLN
INTRAMUSCULAR | Status: AC
  Filled 2022-02-06: qty 20

## 2022-02-06 NOTE — Patient Instructions (Addendum)
Bone Marrow Aspiration and Bone Marrow Biopsy, Adult, Care After This sheet gives you information about how to care for yourself after your procedure. Your health care provider may also give you more specific instructions. If you have problems or questions, contact your health care provider. What can I expect after the procedure? After the procedure, it is common to have: Mild pain and tenderness. Swelling. Bruising. Follow these instructions at home: Puncture site care  Follow instructions from your health care provider about how to take care of the puncture site. Make sure you: Wash your hands with soap and water before and after you change your bandage (dressing). If soap and water are not available, use hand sanitizer. Change your dressing as told by your health care provider. Check your puncture site every day for signs of infection. Check for: More redness, swelling, or pain. Fluid or blood. Warmth. Pus or a bad smell. Activity Return to your normal activities as told by your health care provider. Ask your health care provider what activities are safe for you. Do not lift anything that is heavier than 10 lb (4.5 kg), or the limit that you are told, until your health care provider says that it is safe. Do not drive for 24 hours if you were given a sedative during your procedure. General instructions  Take over-the-counter and prescription medicines only as told by your health care provider. Do not take baths, swim, or use a hot tub until your health care provider approves. Ask your health care provider if you may take showers. You may only be allowed to take sponge baths. If directed, put ice on the affected area. To do this: Put ice in a plastic bag. Place a towel between your skin and the bag. Leave the ice on for 20 minutes, 2-3 times a day. Keep all follow-up visits as told by your health care provider. This is important. Contact a health care provider if: Your pain is not  controlled with medicine. You have a fever. You have more redness, swelling, or pain around the puncture site. You have fluid or blood coming from the puncture site. Your puncture site feels warm to the touch. You have pus or a bad smell coming from the puncture site. Summary After the procedure, it is common to have mild pain, tenderness, swelling, and bruising. Follow instructions from your health care provider about how to take care of the puncture site and what activities are safe for you. Take over-the-counter and prescription medicines only as told by your health care provider. Contact a health care provider if you have any signs of infection, such as fluid or blood coming from the puncture site. This information is not intended to replace advice given to you by your health care provider. Make sure you discuss any questions you have with your health care provider. Document Revised: 12/22/2018 Document Reviewed: 12/22/2018 Elsevier Patient Education  [place].

## 2022-02-06 NOTE — Progress Notes (Signed)
Patient tolerated BMX well. Discharge instruction given and patient stated understanding. No excessive bleeding noted at site. VSS, ambulatory to lobby with wife.

## 2022-02-06 NOTE — Progress Notes (Signed)
INDICATION:multiple myeloma  Brief examination was performed. ENT: adequate airway clearance Heart: regular rate and rhythm.No Murmurs Lungs: clear to auscultation, no wheezes, normal respiratory effort  Bone Marrow Biopsy and Aspiration Procedure Note   Informed consent was obtained and potential risks including bleeding, infection and pain were reviewed with the patient.  The patient's name, date of birth, identification, consent and allergies were verified prior to the start of procedure and time out was performed.  The left posterior iliac crest was chosen as the site of biopsy.  The skin was prepped with ChloraPrep.   14 cc of 2% lidocaine was used to provide local anaesthesia.   10 cc of bone marrow aspirate was obtained followed by 1cm biopsy.  Pressure was applied to the biopsy site and bandage was placed over the biopsy site. Patient was made to lie on the back for 30 mins prior to discharge.  The procedure was tolerated well. COMPLICATIONS: None BLOOD LOSS: none The patient was discharged home in stable condition with a 1 week follow up to review results.  Patient was provided with post bone marrow biopsy instructions and instructed to call if there was any bleeding or worsening pain.  Specimens sent for flow cytometry, cytogenetics and additional studies.  Signed Scot Dock, NP

## 2022-02-08 DIAGNOSIS — C9000 Multiple myeloma not having achieved remission: Secondary | ICD-10-CM | POA: Diagnosis not present

## 2022-02-08 DIAGNOSIS — M549 Dorsalgia, unspecified: Secondary | ICD-10-CM | POA: Diagnosis not present

## 2022-02-08 DIAGNOSIS — B356 Tinea cruris: Secondary | ICD-10-CM | POA: Diagnosis not present

## 2022-02-08 LAB — SURGICAL PATHOLOGY

## 2022-02-11 DIAGNOSIS — M25611 Stiffness of right shoulder, not elsewhere classified: Secondary | ICD-10-CM | POA: Diagnosis not present

## 2022-02-12 ENCOUNTER — Encounter (HOSPITAL_COMMUNITY): Payer: Self-pay | Admitting: Hematology and Oncology

## 2022-02-12 ENCOUNTER — Other Ambulatory Visit: Payer: Self-pay | Admitting: *Deleted

## 2022-02-12 DIAGNOSIS — M5414 Radiculopathy, thoracic region: Secondary | ICD-10-CM | POA: Diagnosis not present

## 2022-02-12 DIAGNOSIS — C9000 Multiple myeloma not having achieved remission: Secondary | ICD-10-CM

## 2022-02-12 DIAGNOSIS — M47816 Spondylosis without myelopathy or radiculopathy, lumbar region: Secondary | ICD-10-CM | POA: Diagnosis not present

## 2022-02-12 DIAGNOSIS — M9903 Segmental and somatic dysfunction of lumbar region: Secondary | ICD-10-CM | POA: Diagnosis not present

## 2022-02-12 DIAGNOSIS — M9902 Segmental and somatic dysfunction of thoracic region: Secondary | ICD-10-CM | POA: Diagnosis not present

## 2022-02-13 DIAGNOSIS — M5414 Radiculopathy, thoracic region: Secondary | ICD-10-CM | POA: Diagnosis not present

## 2022-02-13 DIAGNOSIS — M47816 Spondylosis without myelopathy or radiculopathy, lumbar region: Secondary | ICD-10-CM | POA: Diagnosis not present

## 2022-02-13 DIAGNOSIS — M9903 Segmental and somatic dysfunction of lumbar region: Secondary | ICD-10-CM | POA: Diagnosis not present

## 2022-02-13 DIAGNOSIS — M9902 Segmental and somatic dysfunction of thoracic region: Secondary | ICD-10-CM | POA: Diagnosis not present

## 2022-02-14 DIAGNOSIS — M9902 Segmental and somatic dysfunction of thoracic region: Secondary | ICD-10-CM | POA: Diagnosis not present

## 2022-02-14 DIAGNOSIS — M47816 Spondylosis without myelopathy or radiculopathy, lumbar region: Secondary | ICD-10-CM | POA: Diagnosis not present

## 2022-02-14 DIAGNOSIS — M9903 Segmental and somatic dysfunction of lumbar region: Secondary | ICD-10-CM | POA: Diagnosis not present

## 2022-02-14 DIAGNOSIS — M5414 Radiculopathy, thoracic region: Secondary | ICD-10-CM | POA: Diagnosis not present

## 2022-02-15 ENCOUNTER — Inpatient Hospital Stay (HOSPITAL_BASED_OUTPATIENT_CLINIC_OR_DEPARTMENT_OTHER): Payer: BC Managed Care – PPO | Admitting: Hematology

## 2022-02-15 ENCOUNTER — Other Ambulatory Visit: Payer: Self-pay

## 2022-02-15 ENCOUNTER — Inpatient Hospital Stay: Payer: BC Managed Care – PPO

## 2022-02-15 ENCOUNTER — Other Ambulatory Visit: Payer: Self-pay | Admitting: Hematology

## 2022-02-15 VITALS — BP 155/94 | HR 83 | Temp 97.8°F | Resp 17 | Ht 66.0 in | Wt 208.4 lb

## 2022-02-15 DIAGNOSIS — C9000 Multiple myeloma not having achieved remission: Secondary | ICD-10-CM

## 2022-02-15 DIAGNOSIS — K5900 Constipation, unspecified: Secondary | ICD-10-CM | POA: Diagnosis not present

## 2022-02-15 DIAGNOSIS — G893 Neoplasm related pain (acute) (chronic): Secondary | ICD-10-CM

## 2022-02-15 DIAGNOSIS — Z7189 Other specified counseling: Secondary | ICD-10-CM

## 2022-02-15 DIAGNOSIS — M549 Dorsalgia, unspecified: Secondary | ICD-10-CM

## 2022-02-15 HISTORY — DX: Multiple myeloma not having achieved remission: C90.00

## 2022-02-15 LAB — CBC WITH DIFFERENTIAL (CANCER CENTER ONLY)
Abs Immature Granulocytes: 0.02 10*3/uL (ref 0.00–0.07)
Basophils Absolute: 0 10*3/uL (ref 0.0–0.1)
Basophils Relative: 0 %
Eosinophils Absolute: 0 10*3/uL (ref 0.0–0.5)
Eosinophils Relative: 0 %
HCT: 32.7 % — ABNORMAL LOW (ref 39.0–52.0)
Hemoglobin: 10.9 g/dL — ABNORMAL LOW (ref 13.0–17.0)
Immature Granulocytes: 0 %
Lymphocytes Relative: 20 %
Lymphs Abs: 0.9 10*3/uL (ref 0.7–4.0)
MCH: 29.5 pg (ref 26.0–34.0)
MCHC: 33.3 g/dL (ref 30.0–36.0)
MCV: 88.6 fL (ref 80.0–100.0)
Monocytes Absolute: 0.5 10*3/uL (ref 0.1–1.0)
Monocytes Relative: 10 %
Neutro Abs: 3.3 10*3/uL (ref 1.7–7.7)
Neutrophils Relative %: 70 %
Platelet Count: 165 10*3/uL (ref 150–400)
RBC: 3.69 MIL/uL — ABNORMAL LOW (ref 4.22–5.81)
RDW: 16 % — ABNORMAL HIGH (ref 11.5–15.5)
WBC Count: 4.7 10*3/uL (ref 4.0–10.5)
nRBC: 0 % (ref 0.0–0.2)

## 2022-02-15 LAB — CMP (CANCER CENTER ONLY)
ALT: 40 U/L (ref 0–44)
AST: 30 U/L (ref 15–41)
Albumin: 3.4 g/dL — ABNORMAL LOW (ref 3.5–5.0)
Alkaline Phosphatase: 50 U/L (ref 38–126)
Anion gap: 11 (ref 5–15)
BUN: 21 mg/dL (ref 8–23)
CO2: 24 mmol/L (ref 22–32)
Calcium: 9.9 mg/dL (ref 8.9–10.3)
Chloride: 98 mmol/L (ref 98–111)
Creatinine: 1.13 mg/dL (ref 0.61–1.24)
GFR, Estimated: >60 mL/min (ref >60)
Glucose, Bld: 103 mg/dL — ABNORMAL HIGH (ref 70–99)
Potassium: 4.3 mmol/L (ref 3.5–5.1)
Sodium: 133 mmol/L — ABNORMAL LOW (ref 135–145)
Total Bilirubin: 0.3 mg/dL (ref 0.3–1.2)
Total Protein: 11.2 g/dL — ABNORMAL HIGH (ref 6.5–8.1)

## 2022-02-15 MED ORDER — LENALIDOMIDE 15 MG PO CAPS: 15.0000 mg | ORAL_CAPSULE | Freq: Every day | ORAL | 1 refills | Status: DC

## 2022-02-15 MED ORDER — OXYCODONE HCL 5 MG PO TABS: 5.0000 mg | ORAL_TABLET | ORAL | 0 refills | Status: DC | PRN

## 2022-02-15 MED ORDER — LORAZEPAM 0.5 MG PO TABS: 0.5000 mg | ORAL_TABLET | Freq: Four times a day (QID) | ORAL | 0 refills | Status: DC | PRN

## 2022-02-15 MED ORDER — DEXAMETHASONE 4 MG PO TABS: ORAL_TABLET | ORAL | 5 refills | Status: DC

## 2022-02-15 MED ORDER — DEXAMETHASONE 4 MG PO TABS: 20.0000 mg | ORAL_TABLET | Freq: Every day | ORAL | 0 refills | Status: AC

## 2022-02-15 MED ORDER — POLYETHYLENE GLYCOL 3350 17 G PO PACK: 17.0000 g | PACK | Freq: Every day | ORAL | 2 refills | Status: DC

## 2022-02-15 MED ORDER — PROCHLORPERAZINE MALEATE 10 MG PO TABS: 10.0000 mg | ORAL_TABLET | Freq: Four times a day (QID) | ORAL | 1 refills | Status: DC | PRN

## 2022-02-15 MED ORDER — ESOMEPRAZOLE MAGNESIUM 40 MG PO CPDR: 40.0000 mg | DELAYED_RELEASE_CAPSULE | Freq: Every day | ORAL | 1 refills | Status: DC

## 2022-02-15 MED ORDER — SENNOSIDES-DOCUSATE SODIUM 8.6-50 MG PO TABS: 2.0000 | ORAL_TABLET | Freq: Every day | ORAL | 1 refills | Status: AC

## 2022-02-15 MED ORDER — ACYCLOVIR 400 MG PO TABS: 400.0000 mg | ORAL_TABLET | Freq: Two times a day (BID) | ORAL | 5 refills | Status: DC

## 2022-02-15 MED ORDER — ERGOCALCIFEROL 1.25 MG (50000 UT) PO CAPS: 50000.0000 [IU] | ORAL_CAPSULE | ORAL | 2 refills | Status: DC

## 2022-02-15 MED ORDER — ONDANSETRON HCL 8 MG PO TABS: 8.0000 mg | ORAL_TABLET | Freq: Three times a day (TID) | ORAL | 0 refills | Status: DC | PRN

## 2022-02-15 NOTE — Progress Notes (Signed)
HEMATOLOGY/ONCOLOGY CONSULTATION NOTE  Date of Service: 02/15/2022  Patient Care Team: Lennie Odor, PA as PCP - General (Nurse Practitioner)  CHIEF COMPLAINTS/PURPOSE OF CONSULTATION:  Evaluation and management of newly diagnosed multiple myeloma  HISTORY OF PRESENTING ILLNESS:  Anthony [name] is a wonderful 61 y.o. male who has been referred to [place] by Dr Lennie Odor, PA for evaluation and management of newly diagnosed multiple myeloma. He reports He is doing well.  He reports persistent lower back pain that he has had since he was in his 20's. He notes no previous significant injuries. He further notes that he gets chiropractic adjustments and takes Tylenol to manage his symptoms. He notes less back pain when standing up on his right leg and maintaining weight on his right leg.  He reports previous history of smoking and chewing tobacco over 40 years ago.  He had a recent fall back in February of this year with only a pulled muscle. And he notes another fall when chasing his cat where he fell on his right shoulder. He reports pain in right shoulder.   He had a recent COVID-19 infection back in March.  He reports intermittent cough with SOB. He notes he takes 2 Rolaids at night. We discussed potentially trying antacids which he is agreeable to as he will begin steroids soon and was advised of the possible symptoms and side effects from taking steroids.  We discussed CRAB criteria and that he meets at least two of the criterion being  anemia and bone disease.  We discussed getting additional scans for further evaluation which he is agreeable to.  We further discussed starting Daratumumab/Velcade/Dexamethasone/Zometa and Revlimid for treatment which he is agreeable to. We also discussed starting steroids before treatment and taking an acid suppressant which he was also agreeable to.  We discussed getting Senna and taking it as needed for constipation.  Labs done today were  reviewed in detail.  We discussed his recent bone marrow biopsy and aspiration done 02/06/2022.  We discussed PET/CT scan done 01/28/2022.  We discussed CT right shoulder w/o contrast done 11/13/2021  MEDICAL HISTORY:  Past Medical History:  Diagnosis Date   Back pain     SURGICAL HISTORY: No past surgical history on file.  SOCIAL HISTORY: Social History   Socioeconomic History   Marital status: Married    Spouse name: Not on file   Number of children: Not on file   Years of education: Not on file   Highest education level: Not on file  Occupational History   Not on file  Tobacco Use   Smoking status: Never   Smokeless tobacco: Former    Types: Chew, Snuff  Vaping Use   Vaping Use: Never used  Substance and Sexual Activity   Alcohol use: Yes    Alcohol/week: 2.0 standard drinks of alcohol    Types: 2 Glasses of wine per week   Drug use: No   Sexual activity: Yes  Other Topics Concern   Not on file  Social History Narrative   Not on file   Social Determinants of Health   Financial Resource Strain: Not on file  Food Insecurity: Not on file  Transportation Needs: Not on file  Physical Activity: Not on file  Stress: Not on file  Social Connections: Not on file  Intimate Partner Violence: Not on file    FAMILY HISTORY: Family History  Problem Relation Age of Onset   Rheum arthritis Mother    Other Sister  Pre-cancerous uterine mass;    Rheum arthritis Sister    Bone cancer Paternal Grandfather    Brain cancer Maternal Uncle    Brain cancer Maternal Aunt    Osteoporosis Neg Hx     ALLERGIES:  has No Known Allergies.  MEDICATIONS:  Current Outpatient Medications  Medication Sig Dispense Refill   aluminum chloride (DRYSOL) 20 % external solution Apply 1 application topically at bedtime.     aspirin EC 81 MG tablet Take 81 mg by mouth daily.     calcium-vitamin D (OSCAL WITH D) 250-125 MG-UNIT tablet Take 1 tablet by mouth daily.      diclofenac sodium (VOLTAREN) 1 % GEL Apply 2 g topically as needed.     ibuprofen (ADVIL,MOTRIN) 200 MG tablet Take 200 mg by mouth every 6 (six) hours as needed for moderate pain.     Multiple Vitamin (MULTIVITAMIN) tablet Take 1 tablet by mouth daily.     Omega-3 Fatty Acids (FISH OIL) 1360 MG CAPS Take 1 capsule by mouth daily.     terbinafine (LAMISIL) 1 % cream Apply 1 application topically 2 (two) times daily.     triamcinolone (KENALOG) 0.025 % cream Apply 1 application topically 2 (two) times daily.     No current facility-administered medications for this visit.    REVIEW OF SYSTEMS:    10 Point review of Systems was done is negative except as noted above.  PHYSICAL EXAMINATION: ECOG PERFORMANCE STATUS: {CHL ONC ECOG KV:[id number]}  . Vitals:   02/15/22 1413  BP: (!) 155/94  Pulse: 83  Resp: 17  Temp: 97.8 F (36.6 C)  SpO2: 99%   Filed Weights   02/15/22 1413  Weight: 208 lb 6.4 oz (94.5 kg)   .Body mass index is 33.64 kg/m. NAD GENERAL:alert, in no acute distress and comfortable SKIN: no acute rashes, no significant lesions EYES: conjunctiva are pink and non-injected, sclera anicteric NECK: supple, no JVD LYMPH:  no palpable lymphadenopathy in the cervical, axillary or inguinal regions LUNGS: clear to auscultation b/l with normal respiratory effort HEART: regular rate & rhythm ABDOMEN:  normoactive bowel sounds , non tender, not distended. Extremity: no pedal edema PSYCH: alert & oriented x 3 with fluent speech NEURO: no focal motor/sensory deficits  Exam performed in chair.  LABORATORY DATA:  I have reviewed the data as listed  .    Latest Ref Rng & Units 02/15/2022    1:34 PM 02/06/2022    9:13 AM 01/17/2022    7:38 AM  CBC  WBC 4.0 - 10.5 K/uL 4.7  3.1  3.3   Hemoglobin 13.0 - 17.0 g/dL 10.9  11.4  13.0   Hematocrit 39.0 - 52.0 % 32.7  34.2  38.6   Platelets 150 - 400 K/uL 165  168  177     .    Latest Ref Rng & Units 02/15/2022    1:34 PM  02/06/2022    9:13 AM 01/17/2022    7:38 AM  CMP  Glucose 70 - 99 mg/dL 103  92  135   BUN 8 - 23 mg/dL 21  13  18    Creatinine 0.61 - 1.24 mg/dL 1.13  1.10  1.07   Sodium 135 - 145 mmol/L 133  135  136   Potassium 3.5 - 5.1 mmol/L 4.3  4.1  4.0   Chloride 98 - 111 mmol/L 98  102  101   CO2 22 - 32 mmol/L 24  24  23    Calcium 8.9 -  10.3 mg/dL 9.9  9.8  9.8   Total Protein 6.5 - 8.1 g/dL 11.2  10.1  9.9   Total Bilirubin 0.3 - 1.2 mg/dL 0.3  0.3  0.4   Alkaline Phos 38 - 126 U/L 50  54  48   AST 15 - 41 U/L 30  17  21    ALT 0 - 44 U/L 40  16  23    02/06/2022 Molecular pathology   RADIOGRAPHIC STUDIES: I have personally reviewed the radiological images as listed and agreed with the findings in the report. NM PET Image Restage (PS) Whole Body  Result Date: 01/30/2022 CLINICAL DATA:  Subsequent treatment strategy for multiple myeloma. EXAM: NUCLEAR MEDICINE PET WHOLE BODY TECHNIQUE: 10.7 mCi F-18 FDG was injected intravenously. Full-ring PET imaging was performed from the head to foot after the radiotracer. CT data was obtained and used for attenuation correction and anatomic localization. Fasting blood glucose: 92 mg/dl COMPARISON:  Bone survey 12/25/2021, CT shoulder 11/13/2021 and PET 07/22/2017. FINDINGS: Mediastinal blood pool activity: SUV max 2.4 HEAD/NECK: No abnormal hypermetabolism. Incidental CT findings: None. CHEST: No hypermetabolic mediastinal, hilar or axillary lymph nodes. No hypermetabolic pulmonary nodules. Incidental CT findings: Calcified mediastinal and hilar lymph nodes. Heart is enlarged. No pericardial or pleural effusion. There may be trace left pleural thickening posteriorly. ABDOMEN/PELVIS: No abnormal hypermetabolism in the liver, adrenal glands, spleen or pancreas. No hypermetabolic lymph nodes. Incidental CT findings: Liver and gallbladder are unremarkable. 2.4 cm right adrenal nodule measures 4 Hounsfield units. No follow-up necessary. Kidneys, spleen, pancreas,  stomach and bowel are unremarkable with the exception of a tiny hiatal hernia. Atherosclerotic calcification of the aorta. Enlarged prostate. SKELETON: Mottled hypermetabolism throughout the visualized osseous structures. Index lytic lesion in the right lateral fifth rib has an SUV max of 4.7. A second index 3.2 cm lytic lesion in the right humeral head has an SUV max 4.4. Incidental CT findings: Numerous lytic lesions are seen throughout the visualized osseous structures. Associated pathologic bilateral rib and right humeral head fractures. A lipoma is seen within the right infraspinatus muscle. EXTREMITIES: Mild linear uptake between the muscles of the medial right lower leg with probable fluid on CT. No focal suspicious hypermetabolic lesion in the soft tissues. Incidental CT findings: None. IMPRESSION: 1. Widespread osseous multiple myeloma with pathologic fractures involving the ribs bilaterally and right humeral head. No evidence of extraosseous disease. 2. Right adrenal adenoma. 3. Enlarged prostate. 4.  Aortic atherosclerosis (ICD10-I70.0). Electronically Signed   By: Lorin Picket M.D.   On: 01/30/2022 09:39     ASSESSMENT & PLAN:   61 y.o. very pleasant male with  1. Newly diagnosed multiple myeloma -Recent bone marrow biopsy and aspiration done 02/06/2022 revealed hypercellular bone marrow with plasma cell neoplasm.  Plan -I discussed available labs with the patient in details Labs done 02/06/2022 were reviewed in detail. CBC shows reduced WBC count of 3.1k and  Hgb of 11.4. Platelets are normal at 168k. CMP shows elevated total protein at 11.2. Otherwise WNL. -Patient's chart reviewed in detail -We discussed his recent bone marrow biopsy and aspiration done 02/06/2022 which revealed hypercellular bone marrow with plasma cell neoplasm that represents 50% of all cells in the aspirate. -We discussed his PET/CT scan done 01/28/2022 revealed widespread osseous multiple myeloma with no  evidence of extraosseous disease. Pathologic fractures present in right shoulder and ribs. -We discussed the diagnosis of multiple myeloma, the natural history, staging, initial evaluation, common symptoms, treatment options follow-up. -We discussed CT right  shoulder done 11/13/2021 which revealed complex comminuted fracture of right humeral head and neck. Findings suspicious for pathologic fracture through a lytic bone lesion. Multiple additional small lucent lesions within scapula and thoracic spine. Findings suspicious for MM in patient with history of MGUS. Large intramuscular lipoma within infraspinatus muscle. -We discussed Metastatic bone survery done 12/25/2021 which revealed vague lucencies in humeral shafts b/l which are indeterminate and the possibility of MM or metastatic disease can not be completely excluded.  -We will get additional scans for further evaluation which he is agreeable to. -He will start Daratumumab/Velcade/Dexamethasone/Zometa and Revlimid for treatment  -He will also start taking an acid suppressant prior to the Dexamethasone. -Take Senna as needed for constipation. -We will get labs today to evaluate his current status further and for treatment planning.  Follow up: -Dara/Velcade/Revlimid/Dexamethasone + Zometa to start ASAP within 5-7 days (plz schedule C1 and C2 of treatment) -labs with treatment once weekly on D1,D8, D15 and D22 with treatment -Chemoeducation for the above regimen. -MRI spine in 1 week -Appointment with [facility] with 2nd dose of treatment for toxicity check.  All of the patients questions were answered with apparent satisfaction. The patient knows to call the clinic with any problems, questions or concerns.  I spent {CHL ONC TIME VISIT - YTMMI:[id number]} counseling the patient face to face. The total time spent in the appointment was {CHL ONC TIME VISIT - SJWTG:[id number]} and more than 50% was on counseling and direct patient cares.     Sullivan Lone MD [place] AAHIVMS [facility] Hematology/Oncology Physician [facility]  (Office):       [phone] (Work cell):  [phone] (Fax):           [phone]  I, Melene Muller, am acting as scribe for Fabienne Bruns, MD.

## 2022-02-15 NOTE — Progress Notes (Signed)
START ON PATHWAY REGIMEN - Multiple Myeloma and Other Plasma Cell Dyscrasias   DaraVRd (Daratumumab IV + Bortezomib SUBQ + Lenalidomide PO + Dexamethasone IV/PO) q21 Days (Induction Schema):   A cycle is every 21 days:     Lenalidomide      Dexamethasone      Bortezomib      Daratumumab    DaraVRd (Daratumumab IV + Bortezomib SUBQ + Lenalidomide PO + Dexamethasone IV/PO) q21 Days (Consolidation Schema):   A cycle is every 21 days:     Lenalidomide      Dexamethasone      Bortezomib      Daratumumab   **Always confirm dose/schedule in your pharmacy ordering system**  Patient Characteristics: Multiple Myeloma, Newly Diagnosed, Transplant Eligible, High Risk Disease Classification: Multiple Myeloma R-ISS Staging: III Therapeutic Status: Newly Diagnosed Is Patient Eligible for Transplant<= Transplant Eligible Risk Status: High Risk Intent of Therapy: Non-Curative / Palliative Intent, Discussed with Patient

## 2022-02-18 ENCOUNTER — Other Ambulatory Visit: Payer: Self-pay

## 2022-02-18 ENCOUNTER — Ambulatory Visit (HOSPITAL_COMMUNITY): Payer: BC Managed Care – PPO

## 2022-02-18 ENCOUNTER — Telehealth: Payer: Self-pay | Admitting: Pharmacy Technician

## 2022-02-18 ENCOUNTER — Telehealth: Payer: Self-pay | Admitting: Pharmacist

## 2022-02-18 ENCOUNTER — Other Ambulatory Visit (HOSPITAL_COMMUNITY): Payer: Self-pay

## 2022-02-18 ENCOUNTER — Encounter: Payer: Self-pay | Admitting: Hematology

## 2022-02-18 ENCOUNTER — Encounter (HOSPITAL_COMMUNITY): Payer: Self-pay

## 2022-02-18 DIAGNOSIS — C9000 Multiple myeloma not having achieved remission: Secondary | ICD-10-CM

## 2022-02-18 MED ORDER — REVLIMID 15 MG PO CAPS: 15.0000 mg | ORAL_CAPSULE | Freq: Every day | ORAL | 0 refills | Status: DC

## 2022-02-18 NOTE — Telephone Encounter (Signed)
Oral Oncology Patient Advocate Encounter   Received notification that prior authorization for Lenalidomide (Revlimid) is required.   PA submitted on 02/18/2022 Key [id number] Status is pending     Anthony [name], CPhT-Adv Pharmacy Patient Advocate Specialist [place] Patient Advocate Team Direct Number: [phone]  Fax: [phone]

## 2022-02-18 NOTE — Telephone Encounter (Addendum)
Oral Oncology Patient Advocate Encounter  Prior Authorization for Lenalidomide (Revlimid) has been approved.    PA# [id number] Effective dates: 02/18/2022 through 02/17/2023  Patient's copay is $954.98  Lady Deutscher, CPhT-Adv Pharmacy Patient Advocate Specialist [place] Patient Advocate Team Direct Number: [phone]  Fax: [phone]

## 2022-02-18 NOTE — Telephone Encounter (Signed)
Oral Oncology Patient Advocate Encounter   Was successful in obtaining a copay card for Revlimid.  This copay card will cover up to $15,000 per calendar year towards this medication.    The billing information is as follows and will be shared with [facility].   RxBin: [id number] PCN: Loyalty Member ID: [id number] Group ID: [id number]    Lady Deutscher, CPhT-Adv Pharmacy Patient Advocate Specialist [place] Patient Advocate Team Direct Number: [phone]  Fax: [phone]

## 2022-02-18 NOTE — Progress Notes (Signed)
Pt contacted and Revlimid teaching done. Answered pt's questions and reviewed all material per [facility] site.

## 2022-02-18 NOTE — Telephone Encounter (Addendum)
Oral Oncology Pharmacist Encounter  Received new prescription for Revlimid (lenalidomide) for the treatment of newly diagnosed multiple myeloma in conjunction with daratumumab, bortezomib and dexamethasone, planned duration 6 cycles.  CMP and CBC w/ Diff from 02/15/22 assessed, no relevant lab abnormalities noted. Prescription dose and frequency assessed for appropriateness.   Current medication list in Epic reviewed, no relevant/significant DDIs with Revlimid identified.  Evaluated chart and no patient barriers to medication adherence noted.   Once [employer] Auth is obtained, Revlimid will need to be e-scribed to [employer].   Oral Oncology Clinic will continue to follow for insurance authorization, copayment issues, initial counseling and start date.  Leron Croak, PharmD, BCPS Hematology/Oncology Clinical Pharmacist [facility] [place] [phone] 02/18/2022 12:16 PM

## 2022-02-20 ENCOUNTER — Inpatient Hospital Stay: Payer: BC Managed Care – PPO | Attending: Hematology and Oncology

## 2022-02-20 ENCOUNTER — Other Ambulatory Visit: Payer: Self-pay

## 2022-02-20 DIAGNOSIS — M545 Low back pain, unspecified: Secondary | ICD-10-CM | POA: Insufficient documentation

## 2022-02-20 DIAGNOSIS — G8929 Other chronic pain: Secondary | ICD-10-CM | POA: Insufficient documentation

## 2022-02-20 DIAGNOSIS — Z5112 Encounter for antineoplastic immunotherapy: Secondary | ICD-10-CM | POA: Insufficient documentation

## 2022-02-20 DIAGNOSIS — C9000 Multiple myeloma not having achieved remission: Secondary | ICD-10-CM | POA: Insufficient documentation

## 2022-02-20 NOTE — Telephone Encounter (Signed)
Oral Chemotherapy Pharmacist Encounter   Spoke with representative through [place], to request Revlimid be expedited for shipment for patient. Request placed with pharmacy per representative. Typical turn around time is 3-5 business days, but [facility] is aware patient needs in hand by 02/22/22 and they will attempt to meet that timeline to the best of their abilities.   Copay card information shared with representative as well to ensure this is placed on patient's profile to help cover copay.  Leron Croak, PharmD, BCPS Hematology/Oncology Clinical Pharmacist [facility] [place] [phone] 02/20/2022 8:40 AM

## 2022-02-21 ENCOUNTER — Encounter: Payer: Self-pay | Admitting: Hematology

## 2022-02-21 ENCOUNTER — Other Ambulatory Visit: Payer: Self-pay

## 2022-02-21 DIAGNOSIS — C9000 Multiple myeloma not having achieved remission: Secondary | ICD-10-CM

## 2022-02-21 DIAGNOSIS — M25511 Pain in right shoulder: Secondary | ICD-10-CM | POA: Diagnosis not present

## 2022-02-21 DIAGNOSIS — M25611 Stiffness of right shoulder, not elsewhere classified: Secondary | ICD-10-CM | POA: Diagnosis not present

## 2022-02-21 NOTE — Telephone Encounter (Signed)
Oral Chemotherapy Pharmacist Encounter  I spoke with patient for overview of: Revlimid for the treatment of newly diagnosed multiple myeloma in conjunction with daratumumab, bortezomib and dexamethasone, planned duration 6 cycles.  Counseled patient on administration, dosing, side effects, monitoring, drug-food interactions, safe handling, storage, and disposal.  Patient will take Revlimid 77m capsules, 1 capsule by mouth once daily, without regard to food, with a full glass of water.  Revlimid will be given 14 days on, 7 days off, repeat every 21 days.  Revlimid start date: 02/23/22 PM (Revlimid will be delivered to patient's home on 02/23/22)  Adverse effects of Revlimid include but are not limited to: nausea, constipation, diarrhea, abdominal pain, rash, fatigue, drug fever, and decreased blood counts.   Patient has picked up acyclovir and dexamethasone prescriptions. Patient counseled on importance of daily aspirin 81mfor VTE prophylaxis.  Reviewed with patient importance of keeping a medication schedule and plan for any missed doses. No barriers to medication adherence identified.  Medication reconciliation performed and medication/allergy list updated.  Insurance authorization for Revlimid has been obtained. Revlimid prescription is being dispensed from [facility] it is a limited distribution medication.   All questions answered.  Mr. GeDellaroccooiced understanding and appreciation.   Medication education handout placed in mail for patient. Patient knows to call the office with questions or concerns. Oral Chemotherapy Clinic phone number provided to patient.   ReLeron CroakPharmD, BCPS Hematology/Oncology Clinical Pharmacist [facility] [phone]/01/2022 1:15 PM

## 2022-02-22 ENCOUNTER — Other Ambulatory Visit: Payer: Self-pay | Admitting: Hematology and Oncology

## 2022-02-22 ENCOUNTER — Other Ambulatory Visit: Payer: Self-pay

## 2022-02-22 ENCOUNTER — Inpatient Hospital Stay: Payer: BC Managed Care – PPO

## 2022-02-22 VITALS — BP 150/97 | HR 80 | Temp 98.5°F | Resp 18 | Wt 211.4 lb

## 2022-02-22 DIAGNOSIS — M545 Low back pain, unspecified: Secondary | ICD-10-CM | POA: Diagnosis not present

## 2022-02-22 DIAGNOSIS — C9000 Multiple myeloma not having achieved remission: Secondary | ICD-10-CM | POA: Diagnosis not present

## 2022-02-22 DIAGNOSIS — Z5112 Encounter for antineoplastic immunotherapy: Secondary | ICD-10-CM | POA: Diagnosis not present

## 2022-02-22 DIAGNOSIS — Z7189 Other specified counseling: Secondary | ICD-10-CM

## 2022-02-22 DIAGNOSIS — G8929 Other chronic pain: Secondary | ICD-10-CM | POA: Diagnosis not present

## 2022-02-22 LAB — CMP (CANCER CENTER ONLY)
ALT: 21 U/L (ref 0–44)
AST: 15 U/L (ref 15–41)
Albumin: 3.3 g/dL — ABNORMAL LOW (ref 3.5–5.0)
Alkaline Phosphatase: 58 U/L (ref 38–126)
Anion gap: 9 (ref 5–15)
BUN: 21 mg/dL (ref 8–23)
CO2: 25 mmol/L (ref 22–32)
Calcium: 9 mg/dL (ref 8.9–10.3)
Chloride: 96 mmol/L — ABNORMAL LOW (ref 98–111)
Creatinine: 1.02 mg/dL (ref 0.61–1.24)
GFR, Estimated: >60 mL/min (ref >60)
Glucose, Bld: 113 mg/dL — ABNORMAL HIGH (ref 70–99)
Potassium: 4.2 mmol/L (ref 3.5–5.1)
Sodium: 130 mmol/L — ABNORMAL LOW (ref 135–145)
Total Bilirubin: 0.6 mg/dL (ref 0.3–1.2)
Total Protein: 8.7 g/dL — ABNORMAL HIGH (ref 6.5–8.1)

## 2022-02-22 LAB — TYPE AND SCREEN
ABO/RH(D): O POS
Antibody Screen: NEGATIVE

## 2022-02-22 LAB — PRETREATMENT RBC PHENOTYPE: DAT, IgG: NEGATIVE

## 2022-02-22 LAB — CBC WITH DIFFERENTIAL (CANCER CENTER ONLY)
Abs Immature Granulocytes: 0.1 10*3/uL — ABNORMAL HIGH (ref 0.00–0.07)
Basophils Absolute: 0 10*3/uL (ref 0.0–0.1)
Basophils Relative: 0 %
Eosinophils Absolute: 0.1 10*3/uL (ref 0.0–0.5)
Eosinophils Relative: 3 %
HCT: 35.7 % — ABNORMAL LOW (ref 39.0–52.0)
Hemoglobin: 12.3 g/dL — ABNORMAL LOW (ref 13.0–17.0)
Immature Granulocytes: 2 %
Lymphocytes Relative: 34 %
Lymphs Abs: 1.9 10*3/uL (ref 0.7–4.0)
MCH: 29.9 pg (ref 26.0–34.0)
MCHC: 34.5 g/dL (ref 30.0–36.0)
MCV: 86.9 fL (ref 80.0–100.0)
Monocytes Absolute: 0.5 10*3/uL (ref 0.1–1.0)
Monocytes Relative: 9 %
Neutro Abs: 2.8 10*3/uL (ref 1.7–7.7)
Neutrophils Relative %: 52 %
Platelet Count: 242 10*3/uL (ref 150–400)
RBC: 4.11 MIL/uL — ABNORMAL LOW (ref 4.22–5.81)
RDW: 15.8 % — ABNORMAL HIGH (ref 11.5–15.5)
WBC Count: 5.4 10*3/uL (ref 4.0–10.5)
nRBC: 0 % (ref 0.0–0.2)

## 2022-02-22 MED ORDER — FAMOTIDINE IN NACL 20-0.9 MG/50ML-% IV SOLN
20.0000 mg | Freq: Once | INTRAVENOUS | Status: AC
  Administered 2022-02-22: 20 mg via INTRAVENOUS
  Filled 2022-02-22: qty 50

## 2022-02-22 MED ORDER — MONTELUKAST SODIUM 10 MG PO TABS
10.0000 mg | ORAL_TABLET | Freq: Once | ORAL | Status: AC
  Administered 2022-02-22: 10 mg via ORAL
  Filled 2022-02-22: qty 1

## 2022-02-22 MED ORDER — SODIUM CHLORIDE 0.9 % IV SOLN
20.0000 mg | Freq: Once | INTRAVENOUS | Status: AC
  Administered 2022-02-22: 20 mg via INTRAVENOUS
  Filled 2022-02-22: qty 2

## 2022-02-22 MED ORDER — DEXAMETHASONE 4 MG PO TABS: ORAL_TABLET | ORAL | 5 refills | Status: DC

## 2022-02-22 MED ORDER — SODIUM CHLORIDE 0.9 % IV SOLN
16.5000 mg/kg | Freq: Once | INTRAVENOUS | Status: AC
  Administered 2022-02-22: 1600 mg via INTRAVENOUS
  Filled 2022-02-22: qty 80

## 2022-02-22 MED ORDER — DIPHENHYDRAMINE HCL 25 MG PO CAPS
50.0000 mg | ORAL_CAPSULE | Freq: Once | ORAL | Status: AC
  Administered 2022-02-22: 50 mg via ORAL
  Filled 2022-02-22: qty 2

## 2022-02-22 MED ORDER — BORTEZOMIB CHEMO SQ INJECTION 3.5 MG (2.5MG/ML)
1.3000 mg/m2 | Freq: Once | INTRAMUSCULAR | Status: AC
  Administered 2022-02-22: 2.75 mg via SUBCUTANEOUS
  Filled 2022-02-22: qty 1.1

## 2022-02-22 MED ORDER — ACETAMINOPHEN 325 MG PO TABS
650.0000 mg | ORAL_TABLET | Freq: Once | ORAL | Status: AC
  Administered 2022-02-22: 650 mg via ORAL
  Filled 2022-02-22: qty 2

## 2022-02-22 MED ORDER — SODIUM CHLORIDE 0.9 % IV SOLN: Freq: Once | INTRAVENOUS | Status: AC

## 2022-02-22 MED ORDER — OXYCODONE HCL 5 MG PO TABS: 5.0000 mg | ORAL_TABLET | ORAL | 0 refills | Status: DC | PRN

## 2022-02-22 MED FILL — Dexamethasone Sodium Phosphate Inj 100 MG/10ML: INTRAMUSCULAR | Qty: 2 | Status: AC

## 2022-02-22 NOTE — Patient Instructions (Signed)
[name]  Discharge Instructions: Thank you for choosing [facility] to provide your oncology and hematology care.   If you have a lab appointment with the [facility], please go directly to the [facility] and check in at the registration area.   Wear comfortable clothing and clothing appropriate for easy access to any Portacath or PICC line.   We strive to give you quality time with your provider. You may need to reschedule your appointment if you arrive late (15 or more minutes).  Arriving late affects you and other patients whose appointments are after yours.  Also, if you miss three or more appointments without notifying the office, you may be dismissed from the clinic at the provider's discretion.      For prescription refill requests, have your pharmacy contact our office and allow 72 hours for refills to be completed.    Today you received the following chemotherapy and/or immunotherapy agents Velcade, Darzalex      To help prevent nausea and vomiting after your treatment, we encourage you to take your nausea medication as directed.  BELOW ARE SYMPTOMS THAT SHOULD BE REPORTED IMMEDIATELY: *FEVER GREATER THAN 100.4 F (38 C) OR HIGHER *CHILLS OR SWEATING *NAUSEA AND VOMITING THAT IS NOT CONTROLLED WITH YOUR NAUSEA MEDICATION *UNUSUAL SHORTNESS OF BREATH *UNUSUAL BRUISING OR BLEEDING *URINARY PROBLEMS (pain or burning when urinating, or frequent urination) *BOWEL PROBLEMS (unusual diarrhea, constipation, pain near the anus) TENDERNESS IN MOUTH AND THROAT WITH OR WITHOUT PRESENCE OF ULCERS (sore throat, sores in mouth, or a toothache) UNUSUAL RASH, SWELLING OR PAIN  UNUSUAL VAGINAL DISCHARGE OR ITCHING   Items with * indicate a potential emergency and should be followed up as soon as possible or go to the Emergency Department if any problems should occur.  Please show the CHEMOTHERAPY ALERT CARD or IMMUNOTHERAPY ALERT CARD at  check-in to the Emergency Department and triage nurse.  Should you have questions after your visit or need to cancel or reschedule your appointment, please contact [facility]  Dept: [phone]  and follow the prompts.  Office hours are 8:00 a.m. to 4:30 p.m. Monday - Friday. Please note that voicemails left after 4:00 p.m. may not be returned until the following business day.  We are closed weekends and major holidays. You have access to a nurse at all times for urgent questions. Please call the main number to the clinic Dept: [phone] and follow the prompts.   For any non-urgent questions, you may also contact your provider using MyChart. We now offer e-Visits for anyone 59 and older to request care online for non-urgent symptoms. For details visit [email or web address].   Also download the MyChart app! Go to the app store, search "MyChart", open the app, select [facility], and log in with your MyChart username and password.  Masks are optional in the cancer centers. If you would like for your care team to wear a mask while they are taking care of you, please let them know. For doctor visits, patients may have with them one support person who is at least 62 years old. At this time, visitors are not allowed in the infusion area.  Daratumumab injection- Darzalex What is this medication? DARATUMUMAB (dar a toom ue mab) is a monoclonal antibody. It is used to treat multiple myeloma. This medicine may be used for other purposes; ask your health care provider or pharmacist if you have questions. COMMON BRAND NAME(S): DARZALEX What should  I tell my care team before I take this medication? They need to know if you have any of these conditions: hereditary fructose intolerance infection (especially a virus infection such as chickenpox, herpes, or hepatitis B virus) lung or breathing disease (asthma, COPD) an unusual or allergic reaction to daratumumab, sorbitol,  other medicines, foods, dyes, or preservatives pregnant or trying to get pregnant breast-feeding How should I use this medication? This medicine is for infusion into a vein. It is given by a health care professional in a hospital or clinic setting. Talk to your pediatrician regarding the use of this medicine in children. Special care may be needed. Overdosage: If you think you have taken too much of this medicine contact a poison control center or emergency room at once. NOTE: This medicine is only for you. Do not share this medicine with others. What if I miss a dose? Keep appointments for follow-up doses as directed. It is important not to miss your dose. Call your doctor or health care professional if you are unable to keep an appointment. What may interact with this medication? Interactions have not been studied. This list may not describe all possible interactions. Give your health care provider a list of all the medicines, herbs, non-prescription drugs, or dietary supplements you use. Also tell them if you smoke, drink alcohol, or use illegal drugs. Some items may interact with your medicine. What should I watch for while using this medication? Your condition will be monitored carefully while you are receiving this medicine. This medicine can cause serious allergic reactions. To reduce your risk, your health care provider may give you other medicine to take before receiving this one. Be sure to follow the directions from your health care provider. This medicine can affect the results of blood tests to match your blood type. These changes can last for up to 6 months after the final dose. Your healthcare provider will do blood tests to match your blood type before you start treatment. Tell all of your healthcare providers that you are being treated with this medicine before receiving a blood transfusion. This medicine can affect the results of some tests used to determine treatment response;  extra tests may be needed to evaluate response. Do not become pregnant while taking this medicine or for 3 months after stopping it. Women should inform their health care provider if they wish to become pregnant or think they might be pregnant. There is a potential for serious side effects to an unborn child. Talk to your health care provider for more information. Do not breast-feed an infant while taking this medicine. What side effects may I notice from receiving this medication? Side effects that you should report to your care team as soon as possible: Allergic reactions--skin rash, itching or hives, swelling of the face, lips, or tongue Blurred vision Infection--fever, chills, cough, sore throat, pain or trouble passing urine Infusion reactions--dizziness, fast heartbeat, feeling faint or lightheaded, falls, headache, increase in blood pressure, nausea, vomiting, or wheezing or trouble breathing with loud or whistling sounds Unusual bleeding or bruising Side effects that usually do not require medical attention (report to your care team if they continue or are bothersome): Constipation Diarrhea Pain, tingling, numbness in the hands or feet Swelling of the ankles, feet, hands Tiredness This list may not describe all possible side effects. Call your doctor for medical advice about side effects. You may report side effects to FDA at 1-800-FDA-1088. Where should I keep my medication? This drug is given in  a hospital or clinic and will not be stored at home. NOTE: This sheet is a summary. It may not cover all possible information. If you have questions about this medicine, talk to your doctor, pharmacist, or health care provider.  2023 Elsevier/Gold Standard (2021-01-11 00:00:00) Bortezomib injection- Velcade What is this medication? BORTEZOMIB (bor TEZ oh mib) targets proteins in cancer cells and stops the cancer cells from growing. It treats multiple myeloma and mantle cell lymphoma. This  medicine may be used for other purposes; ask your health care provider or pharmacist if you have questions. COMMON BRAND NAME(S): Velcade What should I tell my care team before I take this medication? They need to know if you have any of these conditions: dehydration diabetes (high blood sugar) heart disease liver disease tingling of the fingers or toes or other nerve disorder an unusual or allergic reaction to bortezomib, mannitol, boron, other medicines, foods, dyes, or preservatives pregnant or trying to get pregnant breast-feeding How should I use this medication? This medicine is injected into a vein or under the skin. It is given by a health care provider in a hospital or clinic setting. Talk to your health care provider about the use of this medicine in children. Special care may be needed. Overdosage: If you think you have taken too much of this medicine contact a poison control center or emergency room at once. NOTE: This medicine is only for you. Do not share this medicine with others. What if I miss a dose? Keep appointments for follow-up doses. It is important not to miss your dose. Call your health care provider if you are unable to keep an appointment. What may interact with this medication? This medicine may interact with the following medications: ketoconazole rifampin This list may not describe all possible interactions. Give your health care provider a list of all the medicines, herbs, non-prescription drugs, or dietary supplements you use. Also tell them if you smoke, drink alcohol, or use illegal drugs. Some items may interact with your medicine. What should I watch for while using this medication? Your condition will be monitored carefully while you are receiving this medicine. You may need blood work done while you are taking this medicine. You may get drowsy or dizzy. Do not drive, use machinery, or do anything that needs mental alertness until you know how this  medicine affects you. Do not stand up or sit up quickly, especially if you are an older patient. This reduces the risk of dizzy or fainting spells This medicine may increase your risk of getting an infection. Call your health care provider for advice if you get a fever, chills, sore throat, or other symptoms of a cold or flu. Do not treat yourself. Try to avoid being around people who are sick. Check with your health care provider if you have severe diarrhea, nausea, and vomiting, or if you sweat a lot. The loss of too much body fluid may make it dangerous for you to take this medicine. Do not become pregnant while taking this medicine or for 7 months after stopping it. Women should inform their health care provider if they wish to become pregnant or think they might be pregnant. Men should not father a child while taking this medicine and for 4 months after stopping it. There is a potential for serious harm to an unborn child. Talk to your health care provider for more information. Do not breast-feed an infant while taking this medicine or for 2 months after stopping it.  This medicine may make it more difficult to get pregnant or father a child. Talk to your health care provider if you are concerned about your fertility. What side effects may I notice from receiving this medication? Side effects that you should report to your doctor or health care professional as soon as possible: allergic reactions (skin rash; itching or hives; swelling of the face, lips, or tongue) bleeding (bloody or black, tarry stools; red or dark brown urine; spitting up blood or brown material that looks like coffee grounds; red spots on the skin; unusual bruising or bleeding from the eye, gums, or nose) blurred vision or changes in vision confusion constipation headache heart failure (trouble breathing; fast, irregular heartbeat; sudden weight gain; swelling of the ankles, feet, hands) infection (fever, chills, cough, sore  throat, pain or trouble passing urine) lack or loss of appetite liver injury (dark yellow or brown urine; general ill feeling or flu-like symptoms; loss of appetite, right upper belly pain; yellowing of the eyes or skin) low blood pressure (dizziness; feeling faint or lightheaded, falls; unusually weak or tired) muscle cramps pain, redness, or irritation at site where injected pain, tingling, numbness in the hands or feet seizures trouble breathing unusual bruising or bleeding Side effects that usually do not require medical attention (report to your doctor or health care professional if they continue or are bothersome): diarrhea nausea stomach pain trouble sleeping vomiting This list may not describe all possible side effects. Call your doctor for medical advice about side effects. You may report side effects to FDA at 1-800-FDA-1088. Where should I keep my medication? This medicine is given in a hospital or clinic. It will not be stored at home. NOTE: This sheet is a summary. It may not cover all possible information. If you have questions about this medicine, talk to your doctor, pharmacist, or health care provider.  2023 Elsevier/Gold Standard (2020-07-27 00:00:00)

## 2022-02-23 ENCOUNTER — Emergency Department (HOSPITAL_BASED_OUTPATIENT_CLINIC_OR_DEPARTMENT_OTHER): Payer: BC Managed Care – PPO

## 2022-02-23 ENCOUNTER — Other Ambulatory Visit: Payer: Self-pay

## 2022-02-23 ENCOUNTER — Emergency Department (HOSPITAL_BASED_OUTPATIENT_CLINIC_OR_DEPARTMENT_OTHER)
Admission: EM | Admit: 2022-02-23 | Discharge: 2022-02-24 | Disposition: A | Discharge status: Home / Self Care | Payer: BC Managed Care – PPO | Attending: Emergency Medicine | Admitting: Emergency Medicine

## 2022-02-23 ENCOUNTER — Encounter (HOSPITAL_BASED_OUTPATIENT_CLINIC_OR_DEPARTMENT_OTHER): Payer: Self-pay | Admitting: Emergency Medicine

## 2022-02-23 DIAGNOSIS — R21 Rash and other nonspecific skin eruption: Secondary | ICD-10-CM

## 2022-02-23 DIAGNOSIS — K5900 Constipation, unspecified: Secondary | ICD-10-CM | POA: Diagnosis not present

## 2022-02-23 DIAGNOSIS — Z7982 Long term (current) use of aspirin: Secondary | ICD-10-CM | POA: Insufficient documentation

## 2022-02-23 DIAGNOSIS — R140 Abdominal distension (gaseous): Secondary | ICD-10-CM | POA: Diagnosis not present

## 2022-02-23 DIAGNOSIS — N400 Enlarged prostate without lower urinary tract symptoms: Secondary | ICD-10-CM | POA: Diagnosis not present

## 2022-02-23 DIAGNOSIS — K6389 Other specified diseases of intestine: Secondary | ICD-10-CM | POA: Diagnosis not present

## 2022-02-23 DIAGNOSIS — R748 Abnormal levels of other serum enzymes: Secondary | ICD-10-CM

## 2022-02-23 HISTORY — DX: Malignant (primary) neoplasm, unspecified: C80.1

## 2022-02-23 LAB — COMPREHENSIVE METABOLIC PANEL WITH GFR
ALT: 25 U/L (ref 0–44)
AST: 20 U/L (ref 15–41)
Albumin: 3.4 g/dL — ABNORMAL LOW (ref 3.5–5.0)
Alkaline Phosphatase: 53 U/L (ref 38–126)
Anion gap: 11 (ref 5–15)
BUN: 25 mg/dL — ABNORMAL HIGH (ref 8–23)
CO2: 25 mmol/L (ref 22–32)
Calcium: 9.4 mg/dL (ref 8.9–10.3)
Chloride: 96 mmol/L — ABNORMAL LOW (ref 98–111)
Creatinine, Ser: 0.96 mg/dL (ref 0.61–1.24)
GFR, Estimated: >60 mL/min
Glucose, Bld: 99 mg/dL (ref 70–99)
Potassium: 4.1 mmol/L (ref 3.5–5.1)
Sodium: 132 mmol/L — ABNORMAL LOW (ref 135–145)
Total Bilirubin: 0.3 mg/dL (ref 0.3–1.2)
Total Protein: 8.6 g/dL — ABNORMAL HIGH (ref 6.5–8.1)

## 2022-02-23 LAB — CBC
HCT: 35.6 % — ABNORMAL LOW (ref 39.0–52.0)
Hemoglobin: 11.8 g/dL — ABNORMAL LOW (ref 13.0–17.0)
MCH: 29 pg (ref 26.0–34.0)
MCHC: 33.1 g/dL (ref 30.0–36.0)
MCV: 87.5 fL (ref 80.0–100.0)
Platelets: 232 10*3/uL (ref 150–400)
RBC: 4.07 MIL/uL — ABNORMAL LOW (ref 4.22–5.81)
RDW: 15.9 % — ABNORMAL HIGH (ref 11.5–15.5)
WBC: 4.1 10*3/uL (ref 4.0–10.5)
nRBC: 0 % (ref 0.0–0.2)

## 2022-02-23 LAB — URINALYSIS, ROUTINE W REFLEX MICROSCOPIC
Bilirubin Urine: NEGATIVE
Glucose, UA: NEGATIVE mg/dL
Hgb urine dipstick: NEGATIVE
Ketones, ur: NEGATIVE mg/dL
Leukocytes,Ua: NEGATIVE
Nitrite: NEGATIVE
Protein, ur: NEGATIVE mg/dL
Specific Gravity, Urine: 1.012 (ref 1.005–1.030)
pH: 5 (ref 5.0–8.0)

## 2022-02-23 LAB — LIPASE, BLOOD: Lipase: 271 U/L — ABNORMAL HIGH (ref 11–51)

## 2022-02-23 MED ORDER — IOHEXOL 300 MG/ML  SOLN
100.0000 mL | Freq: Once | INTRAMUSCULAR | Status: AC | PRN
  Administered 2022-02-23: 100 mL via INTRAVENOUS

## 2022-02-23 NOTE — ED Provider Notes (Signed)
[facility] EMERGENCY DEPT Provider Note   CSN: [id number] Arrival date & time: 02/23/22  1806     History  Chief Complaint  Patient presents with   Medication Reaction   Constipation    [name] is a 61 y.o. male.   Constipation  Patient is a 61 year old male with past medical history significant for newly diagnosed multiple myeloma started on infusion and subcutaneous injection medications this past week.  Had injection into his right lower abdomen yesterday and noticed this morning that he had an area of skin that looked abnormal.  He called the oncology office and talked to RN who recommended he go to emergency room.  He denies any nausea vomiting chest pain difficulty breathing.  No lightheadedness or dizziness no fevers or chills.  He does also endorse abdominal distention and states he has not passed any gas or proved since 6 days ago.  No blood in his recent stool.  He is on Percocet at home for pain control and has not had any abdominal surgeries in the past.    Currently on daratumumab/Velcade/Dexamethasone/Zometa and Revlimid for treatment of MM    Home Medications Prior to Admission medications   Medication Sig Start Date End Date Taking? Authorizing Provider  acyclovir (ZOVIRAX) 400 MG tablet Take 1 tablet (400 mg total) by mouth 2 (two) times daily. 02/15/22   Brunetta Genera, MD  aluminum chloride (DRYSOL) 20 % external solution Apply 1 application topically at bedtime.    [provider]  aspirin EC 81 MG tablet Take 81 mg by mouth daily.    [provider]  calcium-vitamin D (OSCAL WITH D) 250-125 MG-UNIT tablet Take 1 tablet by mouth daily.    [provider]  dexamethasone (DECADRON) 4 MG tablet Take 5 tabs (20 mg) weekly the day after daratumumab for 12 weeks. Take with breakfast. 02/22/22   Orson Slick, MD  diclofenac sodium (VOLTAREN) 1 % GEL Apply 2 g topically as needed.    [provider]   ergocalciferol (VITAMIN D2) 1.25 MG (50000 UT) capsule Take 1 capsule (50,000 Units total) by mouth once a week. 02/15/22   Brunetta Genera, MD  esomeprazole (NEXIUM) 40 MG capsule Take 1 capsule (40 mg total) by mouth daily before breakfast. 02/15/22   Brunetta Genera, MD  ibuprofen (ADVIL,MOTRIN) 200 MG tablet Take 200 mg by mouth every 6 (six) hours as needed for moderate pain.    [provider]  LORazepam (ATIVAN) 0.5 MG tablet Take 1 tablet (0.5 mg total) by mouth every 6 (six) hours as needed (Nausea or vomiting). 02/15/22   Brunetta Genera, MD  Multiple Vitamin (MULTIVITAMIN) tablet Take 1 tablet by mouth daily.    [provider]  Omega-3 Fatty Acids (FISH OIL) 1360 MG CAPS Take 1 capsule by mouth daily.    [provider]  ondansetron (ZOFRAN) 8 MG tablet Take 1 tablet (8 mg total) by mouth every 8 (eight) hours as needed for nausea or vomiting. 02/15/22   Brunetta Genera, MD  oxyCODONE (OXY IR/ROXICODONE) 5 MG immediate release tablet Take 1-2 tablets (5-10 mg total) by mouth every 4 (four) hours as needed for severe pain or moderate pain (cancer related pain). 02/22/22   Orson Slick, MD  polyethylene glycol (MIRALAX) 17 g packet Take 17 g by mouth daily. 02/15/22   Brunetta Genera, MD  prochlorperazine (COMPAZINE) 10 MG tablet Take 1 tablet (10 mg total) by mouth every  6 (six) hours as needed (Nausea or vomiting). 02/15/22   Brunetta Genera, MD  REVLIMID 15 MG capsule Take 1 capsule (15 mg total) by mouth daily. Take for 14 days on and 7 days off. 02/18/22   Brunetta Genera, MD  senna-docusate (SENNA S) 8.6-50 MG tablet Take 2 tablets by mouth at bedtime. 02/15/22   Brunetta Genera, MD  terbinafine (LAMISIL) 1 % cream Apply 1 application topically 2 (two) times daily.    [provider]  triamcinolone (KENALOG) 0.025 % cream Apply 1 application topically 2 (two) times daily.    [provider]       Allergies    Patient has no known allergies.    Review of Systems   Review of Systems  Gastrointestinal:  Positive for constipation.    Physical Exam Updated Vital Signs BP (!) 147/98   Pulse 82   Temp 98.3 F (36.8 C)   Resp 18   SpO2 97%  Physical Exam Vitals and nursing note reviewed.  Constitutional:      General: He is not in acute distress. HENT:     Head: Normocephalic and atraumatic.     Nose: Nose normal.  Eyes:     General: No scleral icterus. Cardiovascular:     Rate and Rhythm: Normal rate and regular rhythm.     Pulses: Normal pulses.     Heart sounds: Normal heart sounds.  Pulmonary:     Effort: Pulmonary effort is normal. No respiratory distress.     Breath sounds: No wheezing.  Abdominal:     Palpations: Abdomen is soft.     Tenderness: There is no abdominal tenderness.     Comments: Protuberant abdomen  Difficult to auscultate any bowel sounds    Musculoskeletal:     Cervical back: Normal range of motion.     Right lower leg: No edema.     Left lower leg: No edema.  Skin:    General: Skin is warm and dry.     Capillary Refill: Capillary refill takes less than 2 seconds.     Comments: 3 x 6 cm area of redness to the right lower abdomen  Neurological:     Mental Status: He is alert. Mental status is at baseline.  Psychiatric:        Mood and Affect: Mood normal.        Behavior: Behavior normal.      ED Results / Procedures / Treatments   Labs (all labs ordered are listed, but only abnormal results are displayed) Labs Reviewed  LIPASE, BLOOD  COMPREHENSIVE METABOLIC PANEL  CBC  URINALYSIS, ROUTINE W REFLEX MICROSCOPIC    EKG None  Radiology No results found.  Procedures Procedures    Medications Ordered in ED Medications - No data to display  ED Course/ Medical Decision Making/ A&P                           Medical Decision Making Amount and/or Complexity of Data Reviewed Labs: ordered. Radiology:  ordered.   This patient presents to the ED for concern of abdominal distention, rash on abdomen, this involves a number of treatment options, and is a complaint that carries with it a moderate to high risk of complications and morbidity.  The differential diagnosis includes SBO, constipation, less likely intra-abdominal infection.   Co morbidities: Discussed in HPI   Brief History:  Patient is a 61 year old male with past medical  history significant for newly diagnosed multiple myeloma started on infusion and subcutaneous injection medications this past week.  Had injection into his right lower abdomen yesterday and noticed this morning that he had an area of skin that looked abnormal.  He called the oncology office and talked to RN who recommended he go to emergency room.  He denies any nausea vomiting chest pain difficulty breathing.  No lightheadedness or dizziness no fevers or chills.  He does also endorse abdominal distention and states he has not passed any gas or proved since 6 days ago.  No blood in his recent stool.  He is on Percocet at home for pain control and has not had any abdominal surgeries in the past.    Currently on daratumumab/Velcade/Dexamethasone/Zometa and Revlimid for treatment of MM   Physical exam notable for quite significantly distended abdomen.    EMR reviewed including pt PMHx, past surgical history and past visits to ER.   See HPI for more details   Lab Tests:  CBC lipase CMP ordered and have not been yet collected.   Imaging Studies:  CT abdomen pelvis with contrast ordered    Cardiac Monitoring:  NA NA   Medicines ordered:  No medications administered   Critical Interventions:     Consults/Attending Physician   I discussed this case with my attending physician who cosigned this note including patient's presenting symptoms, physical exam, and planned diagnostics and interventions. Attending physician stated agreement with  plan or made changes to plan which were implemented.   Attending physician assessed patient at bedside.    Reevaluation:  After the interventions noted above I re-evaluated patient and found that they have :stayed the same   Social Determinants of Health:      Problem List / ED Course:  Rash to abdomen -no evidence of anaphylaxis.  We will follow-up with oncologist on Monday Abdominal pain.  CT abdomen pelvis with contrast and labs obtained.   Dispostion:  Patient care handed off to Dr. Varney Biles for follow-up on CT abdomen pelvis and labs.  Final Clinical Impression(s) / ED Diagnoses Final diagnoses:  None    Rx / DC Orders ED Discharge Orders     None         Tedd Sias, [place] 02/23/22 2156    Varney Biles, MD 02/24/22 1530

## 2022-02-23 NOTE — Discharge Instructions (Addendum)
Please keep your appointment with your oncologist on Monday.  I do not think that any acute treatment of the rash on your abdomen needs to occur however your oncologist may choose to discontinue this particular treatment and try different method of treating your multiple myeloma  CT scan shows no blockage. There is evidence of gas and constipation.  Your pancreas enzyme is also slightly elevated, we are not sure what is causing that.  Discussed the findings in the ER with your oncology team.

## 2022-02-23 NOTE — ED Triage Notes (Signed)
Pt POV- reports started cancer infusion yesterday, received subQ injection to RLQ abd as well. This AM injection site progressively reddening.  Pt also reports ongoing difficulty with constipation.  LBM Monday. Pt has taken 2 senna; magnesium citrate and miralax today.

## 2022-02-24 NOTE — ED Notes (Signed)
Pt verbalizes understanding of discharge instructions. Opportunity for questioning and answers were provided. Pt discharged from ED to home.   ? ?

## 2022-02-25 ENCOUNTER — Inpatient Hospital Stay: Payer: BC Managed Care – PPO

## 2022-02-25 ENCOUNTER — Other Ambulatory Visit: Payer: Self-pay

## 2022-02-25 VITALS — BP 136/89 | HR 81 | Temp 98.3°F | Resp 18

## 2022-02-25 DIAGNOSIS — G8929 Other chronic pain: Secondary | ICD-10-CM | POA: Diagnosis not present

## 2022-02-25 DIAGNOSIS — M545 Low back pain, unspecified: Secondary | ICD-10-CM | POA: Diagnosis not present

## 2022-02-25 DIAGNOSIS — C9000 Multiple myeloma not having achieved remission: Secondary | ICD-10-CM

## 2022-02-25 DIAGNOSIS — Z5112 Encounter for antineoplastic immunotherapy: Secondary | ICD-10-CM | POA: Diagnosis not present

## 2022-02-25 DIAGNOSIS — Z7189 Other specified counseling: Secondary | ICD-10-CM

## 2022-02-25 MED ORDER — BORTEZOMIB CHEMO SQ INJECTION 3.5 MG (2.5MG/ML)
1.3000 mg/m2 | Freq: Once | INTRAMUSCULAR | Status: AC
  Administered 2022-02-25: 2.75 mg via SUBCUTANEOUS
  Filled 2022-02-25: qty 1.1

## 2022-02-25 MED ORDER — PROCHLORPERAZINE MALEATE 10 MG PO TABS
10.0000 mg | ORAL_TABLET | Freq: Once | ORAL | Status: AC
  Administered 2022-02-25: 10 mg via ORAL
  Filled 2022-02-25: qty 1

## 2022-02-25 NOTE — Progress Notes (Signed)
Returned call to pt regarding calls to after hours over the weekend. Pt currently states he is feeling well. Pt did not take steroid dose on Saturday per directive and realized it, so spoke with on-call MD and took on Sunday. Pt states that injection site from Velcade was very red and sore on Saturday but is better today. Pt states he has had to take prn for pain occasionally but has good relief. Pt comes in today for treatment and currently has no c/o.

## 2022-02-25 NOTE — Patient Instructions (Addendum)
[facility] ONCOLOGY  Discharge Instructions: Thank you for choosing [facility] to provide your oncology and hematology care.   If you have a lab appointment with the [facility], please go directly to the [facility] and check in at the registration area.   Wear comfortable clothing and clothing appropriate for easy access to any Portacath or PICC line.   We strive to give you quality time with your provider. You may need to reschedule your appointment if you arrive late (15 or more minutes).  Arriving late affects you and other patients whose appointments are after yours.  Also, if you miss three or more appointments without notifying the office, you may be dismissed from the clinic at the provider's discretion.      For prescription refill requests, have your pharmacy contact our office and allow 72 hours for refills to be completed.    Today you received the following chemotherapy and/or immunotherapy agents: bortezomib (Velcade)      To help prevent nausea and vomiting after your treatment, we encourage you to take your nausea medication as directed.  BELOW ARE SYMPTOMS THAT SHOULD BE REPORTED IMMEDIATELY: *FEVER GREATER THAN 100.4 F (38 C) OR HIGHER *CHILLS OR SWEATING *NAUSEA AND VOMITING THAT IS NOT CONTROLLED WITH YOUR NAUSEA MEDICATION *UNUSUAL SHORTNESS OF BREATH *UNUSUAL BRUISING OR BLEEDING *URINARY PROBLEMS (pain or burning when urinating, or frequent urination) *BOWEL PROBLEMS (unusual diarrhea, constipation, pain near the anus) TENDERNESS IN MOUTH AND THROAT WITH OR WITHOUT PRESENCE OF ULCERS (sore throat, sores in mouth, or a toothache) UNUSUAL RASH, SWELLING OR PAIN  UNUSUAL VAGINAL DISCHARGE OR ITCHING   Items with * indicate a potential emergency and should be followed up as soon as possible or go to the Emergency Department if any problems should occur.  Please show the CHEMOTHERAPY ALERT CARD or IMMUNOTHERAPY ALERT CARD at  check-in to the Emergency Department and triage nurse.  Should you have questions after your visit or need to cancel or reschedule your appointment, please contact [facility]  Dept: [phone]  and follow the prompts.  Office hours are 8:00 a.m. to 4:30 p.m. Monday - Friday. Please note that voicemails left after 4:00 p.m. may not be returned until the following business day.  We are closed weekends and major holidays. You have access to a nurse at all times for urgent questions. Please call the main number to the clinic Dept: [phone] and follow the prompts.   For any non-urgent questions, you may also contact your provider using MyChart. We now offer e-Visits for anyone 38 and older to request care online for non-urgent symptoms. For details visit [email or web address].   Also download the MyChart app! Go to the app store, search "MyChart", open the app, select [facility], and log in with your MyChart username and password.  Masks are optional in the cancer centers. If you would like for your care team to wear a mask while they are taking care of you, please let them know. For doctor visits, patients may have with them one support person who is at least 62 years old. At this time, visitors are not allowed in the infusion area.

## 2022-02-28 ENCOUNTER — Encounter: Payer: Self-pay | Admitting: Hematology

## 2022-02-28 ENCOUNTER — Other Ambulatory Visit: Payer: Self-pay

## 2022-02-28 DIAGNOSIS — C9000 Multiple myeloma not having achieved remission: Secondary | ICD-10-CM

## 2022-02-28 MED ORDER — OXYCODONE HCL 5 MG PO TABS: 5.0000 mg | ORAL_TABLET | ORAL | 0 refills | Status: DC | PRN

## 2022-02-28 MED FILL — Dexamethasone Sodium Phosphate Inj 100 MG/10ML: INTRAMUSCULAR | Qty: 2 | Status: AC

## 2022-02-28 NOTE — Progress Notes (Signed)
Patient called seeking assistance with medication refill. Advised I would reach out to RN to have her call.  Called and spoke with [facility] RN whom will follow up with patient.  He has my card for any additional financial questions or concerns.

## 2022-03-01 ENCOUNTER — Inpatient Hospital Stay: Payer: BC Managed Care – PPO

## 2022-03-01 ENCOUNTER — Other Ambulatory Visit: Payer: Self-pay

## 2022-03-01 ENCOUNTER — Inpatient Hospital Stay (HOSPITAL_BASED_OUTPATIENT_CLINIC_OR_DEPARTMENT_OTHER): Payer: BC Managed Care – PPO | Admitting: Physician Assistant

## 2022-03-01 ENCOUNTER — Other Ambulatory Visit: Payer: Self-pay | Admitting: Physician Assistant

## 2022-03-01 VITALS — BP 149/97 | HR 90 | Temp 98.5°F | Resp 18

## 2022-03-01 DIAGNOSIS — G8929 Other chronic pain: Secondary | ICD-10-CM

## 2022-03-01 DIAGNOSIS — C9000 Multiple myeloma not having achieved remission: Secondary | ICD-10-CM

## 2022-03-01 DIAGNOSIS — M545 Low back pain, unspecified: Secondary | ICD-10-CM

## 2022-03-01 DIAGNOSIS — Z7189 Other specified counseling: Secondary | ICD-10-CM

## 2022-03-01 DIAGNOSIS — Z5112 Encounter for antineoplastic immunotherapy: Secondary | ICD-10-CM | POA: Diagnosis not present

## 2022-03-01 LAB — CMP (CANCER CENTER ONLY)
ALT: 26 U/L (ref 0–44)
AST: 16 U/L (ref 15–41)
Albumin: 3.5 g/dL (ref 3.5–5.0)
Alkaline Phosphatase: 73 U/L (ref 38–126)
Anion gap: 8 (ref 5–15)
BUN: 17 mg/dL (ref 8–23)
CO2: 25 mmol/L (ref 22–32)
Calcium: 9.1 mg/dL (ref 8.9–10.3)
Chloride: 99 mmol/L (ref 98–111)
Creatinine: 0.94 mg/dL (ref 0.61–1.24)
GFR, Estimated: >60 mL/min (ref >60)
Glucose, Bld: 111 mg/dL — ABNORMAL HIGH (ref 70–99)
Potassium: 4.1 mmol/L (ref 3.5–5.1)
Sodium: 132 mmol/L — ABNORMAL LOW (ref 135–145)
Total Bilirubin: 0.4 mg/dL (ref 0.3–1.2)
Total Protein: 7.7 g/dL (ref 6.5–8.1)

## 2022-03-01 LAB — CBC WITH DIFFERENTIAL (CANCER CENTER ONLY)
Abs Immature Granulocytes: 0 10*3/uL (ref 0.00–0.07)
Basophils Absolute: 0 10*3/uL (ref 0.0–0.1)
Basophils Relative: 0 %
Eosinophils Absolute: 0.1 10*3/uL (ref 0.0–0.5)
Eosinophils Relative: 3 %
HCT: 35.5 % — ABNORMAL LOW (ref 39.0–52.0)
Hemoglobin: 12.1 g/dL — ABNORMAL LOW (ref 13.0–17.0)
Immature Granulocytes: 0 %
Lymphocytes Relative: 17 %
Lymphs Abs: 0.6 10*3/uL — ABNORMAL LOW (ref 0.7–4.0)
MCH: 29.2 pg (ref 26.0–34.0)
MCHC: 34.1 g/dL (ref 30.0–36.0)
MCV: 85.7 fL (ref 80.0–100.0)
Monocytes Absolute: 0.2 10*3/uL (ref 0.1–1.0)
Monocytes Relative: 5 %
Neutro Abs: 2.4 10*3/uL (ref 1.7–7.7)
Neutrophils Relative %: 75 %
Platelet Count: 192 10*3/uL (ref 150–400)
RBC: 4.14 MIL/uL — ABNORMAL LOW (ref 4.22–5.81)
RDW: 15.7 % — ABNORMAL HIGH (ref 11.5–15.5)
WBC Count: 3.2 10*3/uL — ABNORMAL LOW (ref 4.0–10.5)
nRBC: 0 % (ref 0.0–0.2)

## 2022-03-01 MED ORDER — LIDOCAINE 5 % EX PTCH: 1.0000 | MEDICATED_PATCH | CUTANEOUS | 0 refills | Status: DC

## 2022-03-01 MED ORDER — SODIUM CHLORIDE 0.9 % IV SOLN: Freq: Once | INTRAVENOUS | Status: AC

## 2022-03-01 MED ORDER — ACETAMINOPHEN 325 MG PO TABS
650.0000 mg | ORAL_TABLET | Freq: Once | ORAL | Status: AC
  Administered 2022-03-01: 650 mg via ORAL
  Filled 2022-03-01: qty 2

## 2022-03-01 MED ORDER — MONTELUKAST SODIUM 10 MG PO TABS
10.0000 mg | ORAL_TABLET | Freq: Once | ORAL | Status: AC
  Administered 2022-03-01: 10 mg via ORAL
  Filled 2022-03-01: qty 1

## 2022-03-01 MED ORDER — DIPHENHYDRAMINE HCL 25 MG PO CAPS
50.0000 mg | ORAL_CAPSULE | Freq: Once | ORAL | Status: AC
  Administered 2022-03-01: 50 mg via ORAL
  Filled 2022-03-01: qty 2

## 2022-03-01 MED ORDER — FAMOTIDINE IN NACL 20-0.9 MG/50ML-% IV SOLN
20.0000 mg | Freq: Once | INTRAVENOUS | Status: AC
  Administered 2022-03-01: 20 mg via INTRAVENOUS
  Filled 2022-03-01: qty 50

## 2022-03-01 MED ORDER — REVLIMID 15 MG PO CAPS: 15.0000 mg | ORAL_CAPSULE | Freq: Every day | ORAL | 0 refills | Status: DC

## 2022-03-01 MED ORDER — BORTEZOMIB CHEMO SQ INJECTION 3.5 MG (2.5MG/ML)
1.3000 mg/m2 | Freq: Once | INTRAMUSCULAR | Status: AC
  Administered 2022-03-01: 2.75 mg via SUBCUTANEOUS
  Filled 2022-03-01: qty 1.1

## 2022-03-01 MED ORDER — SODIUM CHLORIDE 0.9 % IV SOLN
20.0000 mg | Freq: Once | INTRAVENOUS | Status: AC
  Administered 2022-03-01: 20 mg via INTRAVENOUS
  Filled 2022-03-01: qty 20
  Filled 2022-03-01: qty 2

## 2022-03-01 MED ORDER — SODIUM CHLORIDE 0.9 % IV SOLN
16.0000 mg/kg | Freq: Once | INTRAVENOUS | Status: AC
  Administered 2022-03-01: 1500 mg via INTRAVENOUS
  Filled 2022-03-01: qty 60

## 2022-03-01 NOTE — Patient Instructions (Addendum)
[facility] ONCOLOGY  Discharge Instructions: Thank you for choosing [facility] to provide your oncology and hematology care.   If you have a lab appointment with the [facility], please go directly to the [facility] and check in at the registration area.   Wear comfortable clothing and clothing appropriate for easy access to any Portacath or PICC line.   We strive to give you quality time with your provider. You may need to reschedule your appointment if you arrive late (15 or more minutes).  Arriving late affects you and other patients whose appointments are after yours.  Also, if you miss three or more appointments without notifying the office, you may be dismissed from the clinic at the provider's discretion.      For prescription refill requests, have your pharmacy contact our office and allow 72 hours for refills to be completed.    Today you received the following chemotherapy and/or immunotherapy agents: bortezomib, daratumamab       To help prevent nausea and vomiting after your treatment, we encourage you to take your nausea medication as directed.  BELOW ARE SYMPTOMS THAT SHOULD BE REPORTED IMMEDIATELY: *FEVER GREATER THAN 100.4 F (38 C) OR HIGHER *CHILLS OR SWEATING *NAUSEA AND VOMITING THAT IS NOT CONTROLLED WITH YOUR NAUSEA MEDICATION *UNUSUAL SHORTNESS OF BREATH *UNUSUAL BRUISING OR BLEEDING *URINARY PROBLEMS (pain or burning when urinating, or frequent urination) *BOWEL PROBLEMS (unusual diarrhea, constipation, pain near the anus) TENDERNESS IN MOUTH AND THROAT WITH OR WITHOUT PRESENCE OF ULCERS (sore throat, sores in mouth, or a toothache) UNUSUAL RASH, SWELLING OR PAIN  UNUSUAL VAGINAL DISCHARGE OR ITCHING   Items with * indicate a potential emergency and should be followed up as soon as possible or go to the Emergency Department if any problems should occur.  Please show the CHEMOTHERAPY ALERT CARD or IMMUNOTHERAPY ALERT CARD  at check-in to the Emergency Department and triage nurse.  Should you have questions after your visit or need to cancel or reschedule your appointment, please contact [facility]  Dept: [phone]  and follow the prompts.  Office hours are 8:00 a.m. to 4:30 p.m. Monday - Friday. Please note that voicemails left after 4:00 p.m. may not be returned until the following business day.  We are closed weekends and major holidays. You have access to a nurse at all times for urgent questions. Please call the main number to the clinic Dept: [phone] and follow the prompts.   For any non-urgent questions, you may also contact your provider using MyChart. We now offer e-Visits for anyone 83 and older to request care online for non-urgent symptoms. For details visit [email or web address].   Also download the MyChart app! Go to the app store, search "MyChart", open the app, select [facility], and log in with your MyChart username and password.  Masks are optional in the cancer centers. If you would like for your care team to wear a mask while they are taking care of you, please let them know. For doctor visits, patients may have with them one support person who is at least 62 years old. At this time, visitors are not allowed in the infusion area.

## 2022-03-01 NOTE — Progress Notes (Addendum)
HEMATOLOGY/ONCOLOGY PROGRESS NOTE  Date of Service: 03/01/2022  Patient Care Team: Lennie Odor, [place] as PCP - General (Nurse Practitioner)  CHIEF COMPLAINTS/PURPOSE OF CONSULTATION:  IgA Kappa multiple myeloma  CURRENTLY TREATMENT: -First line therapy: Dara/Velcade/Revlimid/Dex plus Zometa started on 02/22/2022  INTERIM HISTORY: Anthony [name] returns to the clinic for continued management multiple myeloma. He is due to Cycle 1, Day 8 of treatment today. He is unaccompanied for this visit.   Mr. Duce reports that he is tolerating treatment well thus far.  He did notice some redness at the injection site which is slowly improving.  The area is slightly pruritic but he denies any purulent discharge or tenderness to the affected area.  His energy levels are fairly stable and he adds that he has not felt fatigued over the last several days.  His appetite is stable.  He denies nausea, vomiting or abdominal pain.  His constipation has resolved after being more aggressive with stool softeners.  He continues to have flareups of his chronic low back pain.  He takes oxycodone 5 mg every 4-6 hours along with ibuprofen which gives him some relief of pain.  He adds that he wants to go see his chiropractor for an adjustment.  He denies easy bruising or signs of active bleeding.  He denies fevers, chills, night sweats, shortness of breath, chest pain or cough.  He has no other complaints.  Rest of the 10 point ROS is below   MEDICAL HISTORY:  Past Medical History:  Diagnosis Date   Back pain    Cancer ([place])    Multiple myeloma ([facility]) 02/15/2022    SURGICAL HISTORY: No past surgical history on file.  SOCIAL HISTORY: Social History   Socioeconomic History   Marital status: Married    Spouse name: Not on file   Number of children: Not on file   Years of education: Not on file   Highest education level: Not on file  Occupational History   Not on file  Tobacco Use   Smoking status: Never    Smokeless tobacco: Former    Types: Chew, Snuff  Vaping Use   Vaping Use: Never used  Substance and Sexual Activity   Alcohol use: Yes    Alcohol/week: 2.0 standard drinks of alcohol    Types: 2 Glasses of wine per week   Drug use: No   Sexual activity: Yes  Other Topics Concern   Not on file  Social History Narrative   Not on file   Social Determinants of Health   Financial Resource Strain: Not on file  Food Insecurity: Not on file  Transportation Needs: Not on file  Physical Activity: Not on file  Stress: Not on file  Social Connections: Not on file  Intimate Partner Violence: Not on file    FAMILY HISTORY: Family History  Problem Relation Age of Onset   Rheum arthritis Mother    Other Sister        Pre-cancerous uterine mass;    Rheum arthritis Sister    Bone cancer Paternal Grandfather    Brain cancer Maternal Uncle    Brain cancer Maternal Aunt    Osteoporosis Neg Hx     ALLERGIES:  has No Known Allergies.  MEDICATIONS:  Current Outpatient Medications  Medication Sig Dispense Refill   lidocaine (LIDODERM) 5 % Place 1 patch onto the skin daily. Remove & Discard patch within 12 hours or as directed by MD 30 patch 0   acyclovir (ZOVIRAX) 400  MG tablet Take 1 tablet (400 mg total) by mouth 2 (two) times daily. 60 tablet 5   aluminum chloride (DRYSOL) 20 % external solution Apply 1 application topically at bedtime.     aspirin EC 81 MG tablet Take 81 mg by mouth daily.     calcium-vitamin D (OSCAL WITH D) 250-125 MG-UNIT tablet Take 1 tablet by mouth daily.     dexamethasone (DECADRON) 4 MG tablet Take 5 tabs (20 mg) weekly the day after daratumumab for 12 weeks. Take with breakfast. 20 tablet 5   diclofenac sodium (VOLTAREN) 1 % GEL Apply 2 g topically as needed.     ergocalciferol (VITAMIN D2) 1.25 MG (50000 UT) capsule Take 1 capsule (50,000 Units total) by mouth once a week. 12 capsule 2   esomeprazole (NEXIUM) 40 MG capsule Take 1 capsule (40 mg total) by  mouth daily before breakfast. 30 capsule 1   ibuprofen (ADVIL,MOTRIN) 200 MG tablet Take 200 mg by mouth every 6 (six) hours as needed for moderate pain.     LORazepam (ATIVAN) 0.5 MG tablet Take 1 tablet (0.5 mg total) by mouth every 6 (six) hours as needed (Nausea or vomiting). 30 tablet 0   Multiple Vitamin (MULTIVITAMIN) tablet Take 1 tablet by mouth daily.     Omega-3 Fatty Acids (FISH OIL) 1360 MG CAPS Take 1 capsule by mouth daily.     ondansetron (ZOFRAN) 8 MG tablet Take 1 tablet (8 mg total) by mouth every 8 (eight) hours as needed for nausea or vomiting. 30 tablet 0   oxyCODONE (OXY IR/ROXICODONE) 5 MG immediate release tablet Take 1-2 tablets (5-10 mg total) by mouth every 4 (four) hours as needed for severe pain or moderate pain (cancer related pain). 30 tablet 0   polyethylene glycol (MIRALAX) 17 g packet Take 17 g by mouth daily. 30 each 2   prochlorperazine (COMPAZINE) 10 MG tablet Take 1 tablet (10 mg total) by mouth every 6 (six) hours as needed (Nausea or vomiting). 30 tablet 1   REVLIMID 15 MG capsule Take 1 capsule (15 mg total) by mouth daily. Take for 14 days on and 7 days off. 14 capsule 0   senna-docusate (SENNA S) 8.6-50 MG tablet Take 2 tablets by mouth at bedtime. 60 tablet 1   terbinafine (LAMISIL) 1 % cream Apply 1 application topically 2 (two) times daily.     triamcinolone (KENALOG) 0.025 % cream Apply 1 application topically 2 (two) times daily.     No current facility-administered medications for this visit.   Facility-Administered Medications Ordered in Other Visits  Medication Dose Route Frequency Provider Last Rate Last Admin   0.9 %  sodium chloride infusion   Intravenous Once Brunetta Genera, MD       bortezomib SQ (VELCADE) chemo injection (2.33m/mL concentration) 2.75 mg  1.3 mg/m2 (Treatment Plan Recorded) Subcutaneous Once KBrunetta Genera MD       daratumumab ([facility] 1,500 mg in sodium chloride 0.9 % 425 mL (3 mg/mL) chemo infusion  16  mg/kg (Treatment Plan Recorded) Intravenous Once KBrunetta Genera MD        REVIEW OF SYSTEMS:    10 Point review of Systems was done is negative except as noted above.  PHYSICAL EXAMINATION: ECOG PERFORMANCE STATUS: 1 - Symptomatic but completely ambulatory  . There were no vitals filed for this visit.  There were no vitals filed for this visit.  .There is no height or weight on file to calculate BMI. NAD GENERAL:alert, in  no acute distress and comfortable SKIN: no acute rashes, no significant lesions EYES: conjunctiva are pink and non-injected, sclera anicteric NECK: supple, no JVD LYMPH:  no palpable lymphadenopathy in the cervical or supraclavicular regions LUNGS: clear to auscultation b/l with normal respiratory effort HEART: regular rate & rhythm Extremity: no pedal edema PSYCH: alert & oriented x 3 with fluent speech NEURO: no focal motor/sensory deficits  Exam performed in chair.  LABORATORY DATA:  I have reviewed the data as listed  .    Latest Ref Rng & Units 03/01/2022    8:37 AM 02/23/2022    9:53 PM 02/22/2022    8:03 AM  CBC  WBC 4.0 - 10.5 K/uL 3.2  4.1  5.4   Hemoglobin 13.0 - 17.0 g/dL 12.1  11.8  12.3   Hematocrit 39.0 - 52.0 % 35.5  35.6  35.7   Platelets 150 - 400 K/uL 192  232  242     .    Latest Ref Rng & Units 03/01/2022    8:37 AM 02/23/2022    9:53 PM 02/22/2022    8:03 AM  CMP  Glucose 70 - 99 mg/dL 111  99  113   BUN 8 - 23 mg/dL 17  25  21    Creatinine 0.61 - 1.24 mg/dL 0.94  0.96  1.02   Sodium 135 - 145 mmol/L 132  132  130   Potassium 3.5 - 5.1 mmol/L 4.1  4.1  4.2   Chloride 98 - 111 mmol/L 99  96  96   CO2 22 - 32 mmol/L 25  25  25    Calcium 8.9 - 10.3 mg/dL 9.1  9.4  9.0   Total Protein 6.5 - 8.1 g/dL 7.7  8.6  8.7   Total Bilirubin 0.3 - 1.2 mg/dL 0.4  0.3  0.6   Alkaline Phos 38 - 126 U/L 73  53  58   AST 15 - 41 U/L 16  20  15    ALT 0 - 44 U/L 26  25  21     02/06/2022 Molecular pathology   RADIOGRAPHIC STUDIES: I  have personally reviewed the radiological images as listed and agreed with the findings in the report. CT ABDOMEN PELVIS W CONTRAST  Result Date: 02/23/2022 CLINICAL DATA:  Abdominal pain, acute, nonlocalized no BM in 6 days also not passing gas, abd distended. EXAM: CT ABDOMEN AND PELVIS WITH CONTRAST TECHNIQUE: Multidetector CT imaging of the abdomen and pelvis was performed using the standard protocol following bolus administration of intravenous contrast. RADIATION DOSE REDUCTION: This exam was performed according to the departmental dose-optimization program which includes automated exposure control, adjustment of the mA and/or kV according to patient size and/or use of iterative reconstruction technique. CONTRAST:  119m OMNIPAQUE IOHEXOL 300 MG/ML  SOLN COMPARISON:  PET CT 01/28/2022 FINDINGS: Lower chest: No acute abnormality Hepatobiliary: No focal hepatic abnormality. Gallbladder unremarkable. Pancreas: No focal abnormality or ductal dilatation. Spleen: Calcifications throughout the spleen.  Normal size. Adrenals/Urinary Tract: 2.3 cm low-density right adrenal nodule again noted, likely adenoma. Left adrenal gland and kidneys unremarkable. No stones or hydronephrosis. Urinary bladder unremarkable. Stomach/Bowel: Moderate stool and gas throughout the colon. Stomach and small bowel decompressed, grossly unremarkable. Vascular/Lymphatic: Aortic atherosclerosis. No evidence of aneurysm or adenopathy. Reproductive: Prostate enlargement. Other: No free fluid or free air. Musculoskeletal: Lucencies throughout the visualized bony skeleton compatible with known history of multiple myeloma. Mild T12 compression fracture. IMPRESSION: Moderate distention of the colon with gas and stool. This could reflect  constipation and/or ileus. Aortic atherosclerosis. Prostate enlargement. Extensive lytic lesions throughout the bony skeleton compatible with multiple myeloma. Mild T12 compression fracture. Electronically Signed    By: Rolm Baptise M.D.   On: 02/23/2022 22:54     ASSESSMENT & PLAN:  Anthony [name] is a 61 y.o. very pleasant male with IgA Kappa Multiple Myeloma.   #IgA Kappa Multiple Myeloma: -Bone marrow biopsy from 02/06/2022 revealed hypercellular bone marrow with plasma cell neoplasm that represents 50% of all cells in the aspirate. -PET/CT scan from 01/28/2022 revealed widespread osseous multiple myeloma with no evidence of extraosseous disease. Pathologic fractures present in right shoulder and ribs. -CT right shoulder from 11/13/2021 revealed complex comminuted fracture of right humeral head and neck. Findings suspicious for pathologic fracture through a lytic bone lesion. Multiple additional small lucent lesions within scapula and thoracic spine.  -Bone survey from 12/25/2021 showed vague lucencies in the humeral shafts bilaterally which are indeterminate and the possibility of multiple myeloma or metastatic disease can not be completely excluded. Previously described lucencies in the right scapula and humeral head are not well visualized radiographically and due to humeral head fracture -Started Daratumumab/Velcade/Dexamethasone and Revlimid on 02/22/2022. Awaiting dental clearance to start Zometa injection.   #Supportive care: -Aspirin for VTE prophylaxis with Revlimid -Take Senna as needed for constipation.  #Chronic low back pain: --Currently taking oxycodone and ibuprofen for pain relief.  --Wears abdominal binder for additional support  PLAN: -Labs today reviewed and adequate for treatment today. Okay to proceed with Cycle 1, Day 8 of treatment.  -SPEP/IFE and sFLC labs pending today -Sent lidocaine patch for chronic low back pain. Continue to take oxycodone and ibuprofen as needed for pain. Okay to receive Toradol injection from PCP periodically with flare ups. Advised against chiropractor adjustment due to underlying lytic lesions and pathologic fractures.  -Will schedule MRI of total  spine to further evaluate underlying back pain.  -Monitor velcade erythematous rash. Advised patient to follow up if there is any tenderness, induration or discharge concerning for infection.   FOLLOW UP: -Continue with weekly labs and treatments. -RTC to see Dr. Taite Baldassari Limbo on 03/11/2022 for a toxicity evaluation.   All of the patients questions were answered with apparent satisfaction. The patient knows to call the clinic with any problems, questions or concerns.  I have spent a total of 30 minutes minutes of face-to-face and non-face-to-face time, preparing to see the patient, performing a medically appropriate examination, counseling and educating the patient, ordering medications/tests, documenting clinical information in the electronic health record, and care coordination.   Dede Query PA-C Dept of Hematology and [facility] at [facility] Phone: [phone]

## 2022-03-04 ENCOUNTER — Other Ambulatory Visit: Payer: Self-pay

## 2022-03-04 ENCOUNTER — Inpatient Hospital Stay: Payer: BC Managed Care – PPO

## 2022-03-04 ENCOUNTER — Inpatient Hospital Stay: Payer: BC Managed Care – PPO | Admitting: Physician Assistant

## 2022-03-04 VITALS — BP 121/82 | HR 90 | Temp 98.1°F | Resp 18

## 2022-03-04 DIAGNOSIS — C9000 Multiple myeloma not having achieved remission: Secondary | ICD-10-CM

## 2022-03-04 DIAGNOSIS — Z5112 Encounter for antineoplastic immunotherapy: Secondary | ICD-10-CM | POA: Diagnosis not present

## 2022-03-04 DIAGNOSIS — G8929 Other chronic pain: Secondary | ICD-10-CM | POA: Diagnosis not present

## 2022-03-04 DIAGNOSIS — M545 Low back pain, unspecified: Secondary | ICD-10-CM | POA: Diagnosis not present

## 2022-03-04 DIAGNOSIS — Z7189 Other specified counseling: Secondary | ICD-10-CM

## 2022-03-04 LAB — KAPPA/LAMBDA LIGHT CHAINS
Kappa free light chain: 30.7 mg/L — ABNORMAL HIGH (ref 3.3–19.4)
Kappa, lambda light chain ratio: 2.15 — ABNORMAL HIGH (ref 0.26–1.65)
Lambda free light chains: 14.3 mg/L (ref 5.7–26.3)

## 2022-03-04 MED ORDER — ONDANSETRON HCL 8 MG PO TABS: 8.0000 mg | ORAL_TABLET | Freq: Three times a day (TID) | ORAL | 0 refills | Status: DC | PRN

## 2022-03-04 MED ORDER — PROCHLORPERAZINE MALEATE 10 MG PO TABS: 10.0000 mg | ORAL_TABLET | Freq: Once | ORAL | Status: DC

## 2022-03-04 MED ORDER — BORTEZOMIB CHEMO SQ INJECTION 3.5 MG (2.5MG/ML)
1.3000 mg/m2 | Freq: Once | INTRAMUSCULAR | Status: AC
  Administered 2022-03-04: 2.75 mg via SUBCUTANEOUS
  Filled 2022-03-04: qty 1.1

## 2022-03-04 NOTE — Progress Notes (Signed)
Patient decided not to stay for evaluation by [facility]. This RN discussed using Benadryl Cream and/or Benadryl orally to help with the rash symptoms. He states he does not like using Benadryl. He understands that if rash gets worse he will call to get an appointment with [facility] or Dr. Irene Limbo. Patient discharged via wheelchair. Wife present for discharge instructions regarding rash.

## 2022-03-04 NOTE — Patient Instructions (Signed)
[facility] ONCOLOGY  Discharge Instructions: Thank you for choosing [facility] to provide your oncology and hematology care.   If you have a lab appointment with the [facility], please go directly to the [facility] and check in at the registration area.   Wear comfortable clothing and clothing appropriate for easy access to any Portacath or PICC line.   We strive to give you quality time with your provider. You may need to reschedule your appointment if you arrive late (15 or more minutes).  Arriving late affects you and other patients whose appointments are after yours.  Also, if you miss three or more appointments without notifying the office, you may be dismissed from the clinic at the provider's discretion.      For prescription refill requests, have your pharmacy contact our office and allow 72 hours for refills to be completed.    Today you received the following chemotherapy and/or immunotherapy agents: bortezomib (Velcade)      To help prevent nausea and vomiting after your treatment, we encourage you to take your nausea medication as directed.  BELOW ARE SYMPTOMS THAT SHOULD BE REPORTED IMMEDIATELY: *FEVER GREATER THAN 100.4 F (38 C) OR HIGHER *CHILLS OR SWEATING *NAUSEA AND VOMITING THAT IS NOT CONTROLLED WITH YOUR NAUSEA MEDICATION *UNUSUAL SHORTNESS OF BREATH *UNUSUAL BRUISING OR BLEEDING *URINARY PROBLEMS (pain or burning when urinating, or frequent urination) *BOWEL PROBLEMS (unusual diarrhea, constipation, pain near the anus) TENDERNESS IN MOUTH AND THROAT WITH OR WITHOUT PRESENCE OF ULCERS (sore throat, sores in mouth, or a toothache) UNUSUAL RASH, SWELLING OR PAIN  UNUSUAL VAGINAL DISCHARGE OR ITCHING   Items with * indicate a potential emergency and should be followed up as soon as possible or go to the Emergency Department if any problems should occur.  Please show the CHEMOTHERAPY ALERT CARD or IMMUNOTHERAPY ALERT CARD at  check-in to the Emergency Department and triage nurse.  Should you have questions after your visit or need to cancel or reschedule your appointment, please contact [facility]  Dept: [phone]  and follow the prompts.  Office hours are 8:00 a.m. to 4:30 p.m. Monday - Friday. Please note that voicemails left after 4:00 p.m. may not be returned until the following business day.  We are closed weekends and major holidays. You have access to a nurse at all times for urgent questions. Please call the main number to the clinic Dept: [phone] and follow the prompts.   For any non-urgent questions, you may also contact your provider using MyChart. We now offer e-Visits for anyone 11 and older to request care online for non-urgent symptoms. For details visit [email or web address].   Also download the MyChart app! Go to the app store, search "MyChart", open the app, select [facility], and log in with your MyChart username and password.  Masks are optional in the cancer centers. If you would like for your care team to wear a mask while they are taking care of you, please let them know. For doctor visits, patients may have with them one support person who is at least 62 years old. At this time, visitors are not allowed in the infusion area.

## 2022-03-04 NOTE — Progress Notes (Signed)
Received Dental Clearance from Dr Jerold Coombe DDS for Zometa. Will send to be scanned into the chart.

## 2022-03-04 NOTE — Progress Notes (Signed)
Patient will need Zometa on Friday 03/11/22. Dental clearance is in the chart. Patient would like to have one IV and not have an IV today for zometa then another on Friday.  Patient is receiving Daratumumab and Velcade. He has some random rashes popping up under his arms, at the antecubital area bilaterally and along his waistline, into his groin area. These rashes are very itchy. He has tried athletes feet cream. This does not help. It is spreading. He also has the typical purple discoloration at the velcade injections sites. Anda Kraft, [facility] notified and asked to come assess patient.

## 2022-03-05 ENCOUNTER — Other Ambulatory Visit: Payer: Self-pay

## 2022-03-05 DIAGNOSIS — C9000 Multiple myeloma not having achieved remission: Secondary | ICD-10-CM

## 2022-03-05 MED ORDER — OXYCODONE HCL 5 MG PO TABS: 5.0000 mg | ORAL_TABLET | ORAL | 0 refills | Status: DC | PRN

## 2022-03-07 ENCOUNTER — Other Ambulatory Visit: Payer: Self-pay | Admitting: Hematology

## 2022-03-07 ENCOUNTER — Other Ambulatory Visit: Payer: Self-pay

## 2022-03-07 DIAGNOSIS — C9000 Multiple myeloma not having achieved remission: Secondary | ICD-10-CM

## 2022-03-07 LAB — MULTIPLE MYELOMA PANEL, SERUM
Albumin SerPl Elph-Mcnc: 3 g/dL (ref 2.9–4.4)
Albumin/Glob SerPl: 0.8 (ref 0.7–1.7)
Alpha 1: 0.3 g/dL (ref 0.0–0.4)
Alpha2 Glob SerPl Elph-Mcnc: 0.7 g/dL (ref 0.4–1.0)
B-Globulin SerPl Elph-Mcnc: 2.9 g/dL — ABNORMAL HIGH (ref 0.7–1.3)
Gamma Glob SerPl Elph-Mcnc: 0.3 g/dL — ABNORMAL LOW (ref 0.4–1.8)
Globulin, Total: 4.2 g/dL — ABNORMAL HIGH (ref 2.2–3.9)
IgA: 2486 mg/dL — ABNORMAL HIGH (ref 61–437)
IgG (Immunoglobin G), Serum: 395 mg/dL — ABNORMAL LOW (ref 603–1613)
IgM (Immunoglobulin M), Srm: 21 mg/dL (ref 20–172)
M Protein SerPl Elph-Mcnc: 1.7 g/dL — ABNORMAL HIGH
Total Protein ELP: 7.2 g/dL (ref 6.0–8.5)

## 2022-03-07 MED FILL — Dexamethasone Sodium Phosphate Inj 100 MG/10ML: INTRAMUSCULAR | Qty: 2 | Status: AC

## 2022-03-08 ENCOUNTER — Inpatient Hospital Stay: Payer: BC Managed Care – PPO

## 2022-03-08 ENCOUNTER — Other Ambulatory Visit: Payer: Self-pay

## 2022-03-08 ENCOUNTER — Inpatient Hospital Stay (HOSPITAL_BASED_OUTPATIENT_CLINIC_OR_DEPARTMENT_OTHER): Payer: BC Managed Care – PPO | Admitting: Physician Assistant

## 2022-03-08 VITALS — BP 125/79 | HR 88 | Temp 98.1°F | Resp 20 | Wt 212.8 lb

## 2022-03-08 DIAGNOSIS — R21 Rash and other nonspecific skin eruption: Secondary | ICD-10-CM

## 2022-03-08 DIAGNOSIS — Z5112 Encounter for antineoplastic immunotherapy: Secondary | ICD-10-CM | POA: Diagnosis not present

## 2022-03-08 DIAGNOSIS — C9000 Multiple myeloma not having achieved remission: Secondary | ICD-10-CM

## 2022-03-08 DIAGNOSIS — G8929 Other chronic pain: Secondary | ICD-10-CM | POA: Diagnosis not present

## 2022-03-08 DIAGNOSIS — K5900 Constipation, unspecified: Secondary | ICD-10-CM

## 2022-03-08 DIAGNOSIS — M545 Low back pain, unspecified: Secondary | ICD-10-CM | POA: Diagnosis not present

## 2022-03-08 DIAGNOSIS — Z7189 Other specified counseling: Secondary | ICD-10-CM

## 2022-03-08 LAB — CMP (CANCER CENTER ONLY)
ALT: 22 U/L (ref 0–44)
AST: 12 U/L — ABNORMAL LOW (ref 15–41)
Albumin: 3.5 g/dL (ref 3.5–5.0)
Alkaline Phosphatase: 92 U/L (ref 38–126)
Anion gap: 7 (ref 5–15)
BUN: 15 mg/dL (ref 8–23)
CO2: 26 mmol/L (ref 22–32)
Calcium: 8.8 mg/dL — ABNORMAL LOW (ref 8.9–10.3)
Chloride: 97 mmol/L — ABNORMAL LOW (ref 98–111)
Creatinine: 0.83 mg/dL (ref 0.61–1.24)
GFR, Estimated: >60 mL/min (ref >60)
Glucose, Bld: 102 mg/dL — ABNORMAL HIGH (ref 70–99)
Potassium: 3.8 mmol/L (ref 3.5–5.1)
Sodium: 130 mmol/L — ABNORMAL LOW (ref 135–145)
Total Bilirubin: 0.8 mg/dL (ref 0.3–1.2)
Total Protein: 6.5 g/dL (ref 6.5–8.1)

## 2022-03-08 LAB — CBC WITH DIFFERENTIAL (CANCER CENTER ONLY)
Abs Immature Granulocytes: 0 10*3/uL (ref 0.00–0.07)
Basophils Absolute: 0 10*3/uL (ref 0.0–0.1)
Basophils Relative: 1 %
Eosinophils Absolute: 0.1 10*3/uL (ref 0.0–0.5)
Eosinophils Relative: 7 %
HCT: 35.9 % — ABNORMAL LOW (ref 39.0–52.0)
Hemoglobin: 12.3 g/dL — ABNORMAL LOW (ref 13.0–17.0)
Immature Granulocytes: 0 %
Lymphocytes Relative: 21 %
Lymphs Abs: 0.3 10*3/uL — ABNORMAL LOW (ref 0.7–4.0)
MCH: 29 pg (ref 26.0–34.0)
MCHC: 34.3 g/dL (ref 30.0–36.0)
MCV: 84.7 fL (ref 80.0–100.0)
Monocytes Absolute: 0.2 10*3/uL (ref 0.1–1.0)
Monocytes Relative: 15 %
Neutro Abs: 0.7 10*3/uL — ABNORMAL LOW (ref 1.7–7.7)
Neutrophils Relative %: 56 %
Platelet Count: 102 10*3/uL — ABNORMAL LOW (ref 150–400)
RBC: 4.24 MIL/uL (ref 4.22–5.81)
RDW: 15.7 % — ABNORMAL HIGH (ref 11.5–15.5)
WBC Count: 1.2 10*3/uL — ABNORMAL LOW (ref 4.0–10.5)
nRBC: 0 % (ref 0.0–0.2)

## 2022-03-08 MED ORDER — SODIUM CHLORIDE 0.9 % IV SOLN
20.0000 mg | Freq: Once | INTRAVENOUS | Status: AC
  Administered 2022-03-08: 20 mg via INTRAVENOUS
  Filled 2022-03-08: qty 20

## 2022-03-08 MED ORDER — MONTELUKAST SODIUM 10 MG PO TABS
10.0000 mg | ORAL_TABLET | Freq: Once | ORAL | Status: AC
  Administered 2022-03-08: 10 mg via ORAL
  Filled 2022-03-08: qty 1

## 2022-03-08 MED ORDER — METHYLPREDNISOLONE 4 MG PO TBPK: ORAL_TABLET | ORAL | 0 refills | Status: DC

## 2022-03-08 MED ORDER — ACETAMINOPHEN 325 MG PO TABS
650.0000 mg | ORAL_TABLET | Freq: Once | ORAL | Status: AC
  Administered 2022-03-08: 650 mg via ORAL
  Filled 2022-03-08: qty 2

## 2022-03-08 MED ORDER — SODIUM CHLORIDE 0.9 % IV SOLN
16.0000 mg/kg | Freq: Once | INTRAVENOUS | Status: DC
  Filled 2022-03-08: qty 75

## 2022-03-08 MED ORDER — SODIUM CHLORIDE 0.9 % IV SOLN: INTRAVENOUS | Status: DC

## 2022-03-08 MED ORDER — SODIUM CHLORIDE 0.9 % IV SOLN: Freq: Once | INTRAVENOUS | Status: AC

## 2022-03-08 MED ORDER — ZOLEDRONIC ACID 4 MG/100ML IV SOLN
4.0000 mg | Freq: Once | INTRAVENOUS | Status: AC
  Administered 2022-03-08: 4 mg via INTRAVENOUS
  Filled 2022-03-08: qty 100

## 2022-03-08 MED ORDER — DIPHENHYDRAMINE HCL 25 MG PO CAPS
50.0000 mg | ORAL_CAPSULE | Freq: Once | ORAL | Status: AC
  Administered 2022-03-08: 50 mg via ORAL
  Filled 2022-03-08: qty 2

## 2022-03-08 MED ORDER — METHYLPREDNISOLONE SODIUM SUCC 125 MG IJ SOLR
125.0000 mg | Freq: Once | INTRAMUSCULAR | Status: AC
  Administered 2022-03-08: 125 mg via INTRAVENOUS
  Filled 2022-03-08: qty 2

## 2022-03-08 MED ORDER — FAMOTIDINE IN NACL 20-0.9 MG/50ML-% IV SOLN
20.0000 mg | Freq: Once | INTRAVENOUS | Status: AC
  Administered 2022-03-08: 20 mg via INTRAVENOUS
  Filled 2022-03-08: qty 50

## 2022-03-08 NOTE — Progress Notes (Signed)
Pt arrived to infusion c/o worsening full-body rash. Pt reported taking Benadryl at home and using topical treatments. Kaitlyn PA from symptom management came to assess patient at bedside. Per MD and PA hold treatment today. Fluids and Solu-medrol given in addition to pre-meds (see MAR). Pt sent home with instructions to call if rash gets worse. MD will reassess during appointment on Monday.

## 2022-03-08 NOTE — Progress Notes (Signed)
Symptom Management Consult note [place]    Patient Care Team: Lennie Odor, [place] as PCP - General (Nurse Practitioner)    Name of the patient: Anthony [name]  [id number]  1961-06-05   Date of visit: 03/08/2022    Chief complaint/ Reason for visit- rash  Oncology History  Multiple myeloma ([place])  07/17/2017 Initial Diagnosis   Multiple myeloma ([place])   02/22/2022 -  Chemotherapy   Patient is on Treatment Plan : MYELOMA NEWLY DIAGNOSED TRANSPLANT CANDIDATE DaraVRd (Daratumumab IV) q21d x 6 Cycles (Induction/Consolidation)     Multiple myeloma not having achieved remission ([place])  02/21/2022 Initial Diagnosis   Multiple myeloma not having achieved remission ([place])   02/22/2022 -  Chemotherapy   Patient is on Treatment Plan : MYELOMA NEWLY DIAGNOSED TRANSPLANT CANDIDATE DaraVRd (Daratumumab IV) q21d x 6 Cycles (Induction/Consolidation)       Current Therapy:  velcade, darzalex, zomenta  Last treatment:  03/01/22    Day   8 Cycle 1  Interval history- Anthony [name] is a 61 y.o. with oncologic history as above presenting to [facility] today with chief complaint of rash x 1 week. Patient states after his treatment on 02/22/22 he had redness around his velcade injection site. He was seen in the ED and it was thought to be injection site reaction.  He states after his treatment on 03/01/2022 he developed a rash under his underarms.  He reports itching.  He applied an over-the-counter cream for athlete's foot and there was no improvement.  The rash spread to his back, groin and legs.  He describes the itching as severe.  He has tried applying Benadryl spray without much improvement.  He did note after taking Benadryl pill the itching improved.  He denies any new soaps or lotions.  He denies history of similar rash.  Patient also reports he has some shortness of breath, leg swelling and abdominal bloating.  He has been having constipation over the last several weeks.  He has been taking  MiraLAX and stool softener and had a bowel movement this morning.  He felt like his bloating improved after the bowel movement.  He plans to continue MiraLAX and stool softener.  Patient denies any new medications.  Denies any fever or prodrome of URI symptoms.  Denies any lesions in his mouth. He denies any congestion, eye pain, chest pain, palpitations, syncope.      ROS  All other systems are reviewed and are negative for acute change except as noted in the HPI.    No Known Allergies   Past Medical History:  Diagnosis Date   Back pain    Cancer ([place])    Multiple myeloma ([place]) 02/15/2022     No past surgical history on file.  Social History   Socioeconomic History   Marital status: Married    Spouse name: Not on file   Number of children: Not on file   Years of education: Not on file   Highest education level: Not on file  Occupational History   Not on file  Tobacco Use   Smoking status: Never   Smokeless tobacco: Former    Types: Chew, Snuff  Vaping Use   Vaping Use: Never used  Substance and Sexual Activity   Alcohol use: Yes    Alcohol/week: 2.0 standard drinks of alcohol    Types: 2 Glasses of wine per week   Drug use: No   Sexual activity: Yes  Other Topics Concern  Not on file  Social History Narrative   Not on file   Social Determinants of Health   Financial Resource Strain: Not on file  Food Insecurity: Not on file  Transportation Needs: Not on file  Physical Activity: Not on file  Stress: Not on file  Social Connections: Not on file  Intimate Partner Violence: Not on file    Family History  Problem Relation Age of Onset   Rheum arthritis Mother    Other Sister        Pre-cancerous uterine mass;    Rheum arthritis Sister    Bone cancer Paternal Grandfather    Brain cancer Maternal Uncle    Brain cancer Maternal Aunt    Osteoporosis Neg Hx      Current Outpatient Medications:    methylPREDNISolone (MEDROL DOSEPAK) 4 MG TBPK tablet,  Take 6 pills by mouth day 1, 5 on day 2, 4 on day 3, 3 on day 4, 2 on day 5, 1 on day 6, Disp: 21 tablet, Rfl: 0   acyclovir (ZOVIRAX) 400 MG tablet, Take 1 tablet (400 mg total) by mouth 2 (two) times daily., Disp: 60 tablet, Rfl: 5   aluminum chloride (DRYSOL) 20 % external solution, Apply 1 application topically at bedtime., Disp: , Rfl:    aspirin EC 81 MG tablet, Take 81 mg by mouth daily., Disp: , Rfl:    calcium-vitamin D (OSCAL WITH D) 250-125 MG-UNIT tablet, Take 1 tablet by mouth daily., Disp: , Rfl:    cyclobenzaprine (FLEXERIL) 10 MG tablet, Take 10 mg by mouth 3 (three) times daily as needed for muscle spasms., Disp: , Rfl:    dexamethasone (DECADRON) 4 MG tablet, Take 5 tabs (20 mg) weekly the day after daratumumab for 12 weeks. Take with breakfast., Disp: 20 tablet, Rfl: 5   diclofenac (VOLTAREN) 50 MG EC tablet, Take 50 mg by mouth 2 (two) times daily., Disp: , Rfl:    diclofenac sodium (VOLTAREN) 1 % GEL, Apply 2 g topically as needed., Disp: , Rfl:    ergocalciferol (VITAMIN D2) 1.25 MG (50000 UT) capsule, Take 1 capsule (50,000 Units total) by mouth once a week., Disp: 12 capsule, Rfl: 2   esomeprazole (NEXIUM) 40 MG capsule, Take 1 capsule (40 mg total) by mouth daily before breakfast., Disp: 30 capsule, Rfl: 1   ibuprofen (ADVIL,MOTRIN) 200 MG tablet, Take 200 mg by mouth every 6 (six) hours as needed for moderate pain., Disp: , Rfl:    lidocaine (LIDODERM) 5 %, Place 1 patch onto the skin daily. Remove & Discard patch within 12 hours or as directed by MD, Disp: 30 patch, Rfl: 0   LORazepam (ATIVAN) 0.5 MG tablet, Take 1 tablet (0.5 mg total) by mouth every 6 (six) hours as needed (Nausea or vomiting)., Disp: 30 tablet, Rfl: 0   Multiple Vitamin (MULTIVITAMIN) tablet, Take 1 tablet by mouth daily., Disp: , Rfl:    Omega-3 Fatty Acids (FISH OIL) 1360 MG CAPS, Take 1 capsule by mouth daily., Disp: , Rfl:    ondansetron (ZOFRAN) 8 MG tablet, Take 1 tablet (8 mg total) by mouth every  8 (eight) hours as needed for nausea or vomiting., Disp: 30 tablet, Rfl: 0   oxyCODONE (OXY IR/ROXICODONE) 5 MG immediate release tablet, Take 1-2 tablets (5-10 mg total) by mouth every 4 (four) hours as needed for severe pain or moderate pain (cancer related pain)., Disp: 30 tablet, Rfl: 0   prochlorperazine (COMPAZINE) 10 MG tablet, Take 1 tablet (10 mg total)  by mouth every 6 (six) hours as needed (Nausea or vomiting)., Disp: 30 tablet, Rfl: 1   REVLIMID 15 MG capsule, Take 1 capsule (15 mg total) by mouth daily. Take for 14 days on and 7 days off., Disp: 14 capsule, Rfl: 0   senna-docusate (SENNA S) 8.6-50 MG tablet, Take 2 tablets by mouth at bedtime., Disp: 60 tablet, Rfl: 1   terbinafine (LAMISIL) 1 % cream, Apply 1 application topically 2 (two) times daily., Disp: , Rfl:    triamcinolone (KENALOG) 0.025 % cream, Apply 1 application topically 2 (two) times daily., Disp: , Rfl:  No current facility-administered medications for this visit.  Facility-Administered Medications Ordered in Other Visits:    0.9 %  sodium chloride infusion, , Intravenous, Continuous, Walisiewicz, Cataleyah Colborn E, PA-C, Last Rate: 500 mL/hr at 03/08/22 1106, New Bag at 03/08/22 1106   daratumumab (DARZALEX) 1,500 mg in sodium chloride 0.9 % 425 mL (3 mg/mL) chemo infusion, 16 mg/kg (Treatment Plan Recorded), Intravenous, Once, Irene Limbo, Cloria Spring, MD   methylPREDNISolone sodium succinate (SOLU-MEDROL) 125 mg/2 mL injection 125 mg, 125 mg, Intravenous, Once, Walisiewicz, Leaman Abe E, PA-C  PHYSICAL EXAM: ECOG FS:1 - Symptomatic but completely ambulatory   T: 97.9   BP: 132/79   HR: 90  R: 99% on room air Physical Exam Vitals and nursing note reviewed. Exam conducted with a chaperone present.  Constitutional:      Appearance: He is well-developed. He is not ill-appearing or toxic-appearing.  HENT:     Head: Normocephalic and atraumatic.     Right Ear: External ear normal.     Left Ear: External ear normal.     Nose:  Nose normal.     Mouth/Throat:     Lips: No lesions.     Mouth: Mucous membranes are moist. No oral lesions.     Dentition: No gum lesions.     Tongue: No lesions.     Palate: No lesions.     Pharynx: Oropharynx is clear.  Eyes:     General: No scleral icterus.       Right eye: No discharge.        Left eye: No discharge.     Conjunctiva/sclera: Conjunctivae normal.  Neck:     Vascular: No JVD.  Cardiovascular:     Rate and Rhythm: Normal rate and regular rhythm.     Pulses: Normal pulses.          Radial pulses are 2+ on the right side and 2+ on the left side.     Heart sounds: Normal heart sounds.  Pulmonary:     Effort: Pulmonary effort is normal. No respiratory distress.     Breath sounds: Normal breath sounds. No stridor. No wheezing, rhonchi or rales.  Chest:     Chest wall: No tenderness.  Abdominal:     General: Bowel sounds are normal. There is distension.     Palpations: Abdomen is soft. There is no mass.     Tenderness: There is no abdominal tenderness. There is no guarding or rebound.     Hernia: No hernia is present.  Genitourinary:    Penis: Normal. No tenderness.      Testes: Normal.  Musculoskeletal:        General: Normal range of motion.     Cervical back: Normal range of motion.     Right lower leg: 1+ Edema present.     Left lower leg: 1+ Edema present.  Skin:    General: Skin is  warm and dry.     Findings: Rash present. Rash is macular. Rash is not crusting.     Comments: Patient gave verbal permission to utilize photo for medical documentation only The image was not stored on any personal device.  Rash to bilateral axilla, torso, groin, bilateral lower extremities  Neurological:     Mental Status: He is oriented to person, place, and time.     GCS: GCS eye subscore is 4. GCS verbal subscore is 5. GCS motor subscore is 6.     Comments: Fluent speech, no facial droop.  Psychiatric:        Behavior: Behavior normal.                  LABORATORY DATA: I have reviewed the data as listed    Latest Ref Rng & Units 03/08/2022    8:37 AM 03/01/2022    8:37 AM 02/23/2022    9:53 PM  CBC  WBC 4.0 - 10.5 K/uL 1.2  3.2  4.1   Hemoglobin 13.0 - 17.0 g/dL 12.3  12.1  11.8   Hematocrit 39.0 - 52.0 % 35.9  35.5  35.6   Platelets 150 - 400 K/uL 102  192  232         Latest Ref Rng & Units 03/08/2022    8:37 AM 03/01/2022    8:37 AM 02/23/2022    9:53 PM  CMP  Glucose 70 - 99 mg/dL 102  111  99   BUN 8 - 23 mg/dL _0 Creatinine 0.61 - 1.24 mg/dL 0.83  0.94  0.96   Sodium 135 - 145 mmol/L 130  132  132   Potassium 3.5 - 5.1 mmol/L 3.8  4.1  4.1   Chloride 98 - 111 mmol/L 97  99  96   CO2 22 - 32 mmol/L _1 Calcium 8.9 - 10.3 mg/dL 8.8  9.1  9.4   Total Protein 6.5 - 8.1 g/dL 6.5  7.7  8.6   Total Bilirubin 0.3 - 1.2 mg/dL 0.8  0.4  0.3   Alkaline Phos 38 - 126 U/L 92  73  53   AST 15 - 41 U/L _2 ALT 0 - 44 U/L _3 RADIOGRAPHIC STUDIES (from last 24 hours if applicable) I have personally reviewed the radiological images as listed and agreed with the findings in the report. No results found.     ASSESSMENT & PLAN: Patient is a 61 y.o. male  with oncologic history of multiple management myeloma followed by Dr. Irene Limbo.  I have viewed most recent oncology note and lab work.   #) Rash-patient with extensive rash.  He has no mucosal involvement.  No skin sloughing to suggest Stevens-Johnson syndrome.  Rash is likely side effect of Darzalex or Velcade treatment.  We will treat patient with steroid taper and recommend p.o. Benadryl for itching.  Advised him to hold off on any ointments or lotions.  Labs today show worsening neutropenia, white count 1.2 and ANC 0.7.  CMP overall unremarkable.  #) Constipation-abdomen is nontender.  Slightly distended.  Normoactive bowel sounds.  No peritoneal signs. Exam not suggestive of acute surgical abdomen. Has had bloating  since treatment started.  He did have imaging while in the emergency department 02/23/2022 which I viewed and shows moderate distention of the colon with gas and  stool which could reflect constipation and/or ileus.  As he is having bowel movements with the help of MiraLAX and stool softener encouraged him to continue that regimen.  #) multiple management myelmoa -we will hold Velcade and Darzalex treatment today as it could be contributing to the rash. Next appointment with oncologist is 03/11/22.   Strict ER precautions discussed with patient and his spouse.   Visit Diagnosis: 1. Rash   2. Multiple myeloma not having achieved remission ([place])   3. Constipation, unspecified constipation type      No orders of the defined types were placed in this encounter.   All questions were answered. The patient knows to call the clinic with any problems, questions or concerns. No barriers to learning was detected.  I have spent a total of 30 minutes minutes of face-to-face and non-face-to-face time, preparing to see the patient, obtaining and/or reviewing separately obtained history, performing a medically appropriate examination, counseling and educating the patient, ordering tests, documenting clinical information in the electronic health record, and care coordination (communications with other health care professionals or caregivers).    Thank you for allowing me to participate in the care of this patient.    Barrie Folk, PA-C Department of Hematology/Oncology [facility] Phone: [phone]  [phone]    03/08/2022 11:29 AM

## 2022-03-08 NOTE — Patient Instructions (Addendum)

## 2022-03-11 ENCOUNTER — Other Ambulatory Visit: Payer: Self-pay

## 2022-03-11 ENCOUNTER — Inpatient Hospital Stay: Payer: BC Managed Care – PPO

## 2022-03-11 ENCOUNTER — Other Ambulatory Visit (HOSPITAL_COMMUNITY): Payer: Self-pay

## 2022-03-11 ENCOUNTER — Telehealth: Payer: Self-pay

## 2022-03-11 ENCOUNTER — Inpatient Hospital Stay (HOSPITAL_BASED_OUTPATIENT_CLINIC_OR_DEPARTMENT_OTHER): Payer: BC Managed Care – PPO | Admitting: Hematology

## 2022-03-11 VITALS — BP 123/73 | HR 88 | Temp 97.5°F | Resp 20 | Wt 207.4 lb

## 2022-03-11 DIAGNOSIS — C9000 Multiple myeloma not having achieved remission: Secondary | ICD-10-CM | POA: Diagnosis not present

## 2022-03-11 DIAGNOSIS — G8929 Other chronic pain: Secondary | ICD-10-CM | POA: Diagnosis not present

## 2022-03-11 DIAGNOSIS — Z5112 Encounter for antineoplastic immunotherapy: Secondary | ICD-10-CM | POA: Diagnosis not present

## 2022-03-11 DIAGNOSIS — M545 Low back pain, unspecified: Secondary | ICD-10-CM | POA: Diagnosis not present

## 2022-03-11 MED ORDER — FUROSEMIDE 20 MG PO TABS: 20.0000 mg | ORAL_TABLET | Freq: Every day | ORAL | 1 refills | Status: DC

## 2022-03-11 MED ORDER — FENTANYL 12 MCG/HR TD PT72
1.0000 | MEDICATED_PATCH | TRANSDERMAL | 0 refills | Status: DC
Start: 2022-03-11 — End: 2022-03-11

## 2022-03-11 MED ORDER — FENTANYL 12 MCG/HR TD PT72
1.0000 | MEDICATED_PATCH | TRANSDERMAL | 0 refills | Status: DC
  Filled 2022-03-11: qty 5, 15d supply, fill #0

## 2022-03-11 MED ORDER — LIDOCAINE 5 % EX PTCH: 1.0000 | MEDICATED_PATCH | CUTANEOUS | 0 refills | Status: DC

## 2022-03-11 MED ORDER — OXYCODONE HCL 5 MG PO TABS: 5.0000 mg | ORAL_TABLET | ORAL | 0 refills | Status: DC | PRN

## 2022-03-11 NOTE — Patient Instructions (Signed)
-  Please start taking loratadine (Claritin) 10 mg p.o. daily over-the-counter -Please start taking famotidine 40 mg p.o. daily over-the-counter -At least 1000 mg of calcium intake daily in the form of dairy products as discussed. -Increase ergocalciferol to 50,000 units twice weekly -Urgent ultrasound of bilateral lower extremities to rule out DVT -Prescription for Lasix [diuretic] has been sent to your pharmacy -Starting fentanyl patch at 12.5 mcg/h for optimal pain control -As needed oxycodone for breakthrough pain -Minimize use of NSAIDs such as ibuprofen Advil Aleve Motrin. -Okay to use Tylenol intermittently if needed. -Hold Revlimid at this time. -Maintain good control of bowel habits with Senna-S 2 tablets twice a day and MiraLAX daily. -Maintain good hydration with at least 2 L of water intake daily. -Okay to use lorazepam 0.5 mg to 1 mg up to twice daily for muscle spasms. -MRI of the spine as scheduled.

## 2022-03-11 NOTE — Telephone Encounter (Signed)
Notified Patient of completion of FMLA and Short Term Disability Forms. Fax transmission confirmations received. Copy of Forms mailed to Patient as requested. Notified patient of prior authorization approval for Lidocaine 5% Patches. Medication is approved through 03/10/2023. No other needs or concerns voiced at this time.

## 2022-03-11 NOTE — Progress Notes (Signed)
HEMATOLOGY/ONCOLOGY CLINIC NOTE  Date of Service: 03/11/2022  Patient Care Team: Lennie Odor, [place] as PCP - General (Nurse Practitioner)  CHIEF COMPLAINTS/PURPOSE OF CONSULTATION:  Evaluation and management of newly diagnosed multiple myeloma  HISTORY OF PRESENTING ILLNESS:  Anthony [name] is a wonderful 61 y.o. male who has been referred to [place] by Lennie Odor, PA for evaluation and management of newly diagnosed multiple myeloma. He reports He is doing well.  He reports persistent lower back pain that he has had since he was in his 20's. He notes no previous significant injuries. He further notes that he gets chiropractic adjustments and takes Tylenol to manage his symptoms. He notes less back pain when standing up on his right leg and maintaining weight on his right leg.  He reports previous history of smoking and chewing tobacco over 40 years ago.  He had a recent fall back in February of this year with only a pulled muscle. And he notes another fall when chasing his cat where he fell on his right shoulder. He reports pain in right shoulder.   He had a recent COVID-19 infection back in March.  He reports intermittent cough with SOB. He notes he takes 2 Rolaids at night. We discussed potentially trying antacids which he is agreeable to as he will begin steroids soon and was advised of the possible symptoms and side effects from taking steroids.  We discussed CRAB criteria and that he meets at least two of the criterion being  anemia and bone disease.  We discussed getting additional scans for further evaluation which he is agreeable to.  We further discussed starting Daratumumab/Velcade/Dexamethasone/Zometa and Revlimid for treatment which he is agreeable to. We also discussed starting steroids before treatment and taking an acid suppressant which he was also agreeable to.  We discussed getting Senna and taking it as needed for constipation.  Labs done today were reviewed in  detail.  We discussed his recent bone marrow biopsy and aspiration done 02/06/2022.  We discussed PET/CT scan done 01/28/2022.  We discussed CT right shoulder w/o contrast done 11/13/2021  INTERVAL HISTORY: Anthony [name] is a wonderful 61 y.o. male who has been referred to [place] by Lennie Odor, PA for evaluation and management of newly diagnosed multiple myeloma. He reports He is doing well.  He reports bodily rash that is improving but remains fairly persistent. He notes that it was really itchy last night. He further notes that the prednisone has helped with the itching.  He reports fluid retention and leg swelling. We discussed getting an [place] to evaluate for blood clots.  He notes persistent muscle spasms that are incredibly painful which are triggered by inhaling sharply or other jarring movements.  We discussed building calcium stores. Recommended drinking 2 glasses of milk per day and taking high dose vitamin D 2x per week.  We discussed starting 2 antihistamines.   He notes back and hip pain.  He notes the oxycodone has been helpful for pain management. We discussed also using fentanyl patches to reduce need for NSAIDS.  He notes reduced appetite and altered taste. He notes occasional nausea.   We discussed holding treatment today and holding Revlimid at this time.  He notes some SOB when taking in a deep breath and he says the pain is mostly in his ribs.  No other new or acute focal symptoms.  Labs done 03/08/2022 were reviewed in detail.  MEDICAL HISTORY:  Past Medical History:  Diagnosis Date  Back pain    Cancer ([place])    Multiple myeloma ([place]) 02/15/2022    SURGICAL HISTORY: No past surgical history on file.  SOCIAL HISTORY: Social History   Socioeconomic History   Marital status: Married    Spouse name: Not on file   Number of children: Not on file   Years of education: Not on file   Highest education level: Not on file  Occupational History    Not on file  Tobacco Use   Smoking status: Never   Smokeless tobacco: Former    Types: Chew, Snuff  Vaping Use   Vaping Use: Never used  Substance and Sexual Activity   Alcohol use: Yes    Alcohol/week: 2.0 standard drinks of alcohol    Types: 2 Glasses of wine per week   Drug use: No   Sexual activity: Yes  Other Topics Concern   Not on file  Social History Narrative   Not on file   Social Determinants of Health   Financial Resource Strain: Not on file  Food Insecurity: Not on file  Transportation Needs: Not on file  Physical Activity: Not on file  Stress: Not on file  Social Connections: Not on file  Intimate Partner Violence: Not on file    FAMILY HISTORY: Family History  Problem Relation Age of Onset   Rheum arthritis Mother    Other Sister        Pre-cancerous uterine mass;    Rheum arthritis Sister    Bone cancer Paternal Grandfather    Brain cancer Maternal Uncle    Brain cancer Maternal Aunt    Osteoporosis Neg Hx     ALLERGIES:  has No Known Allergies.  MEDICATIONS:  Current Outpatient Medications  Medication Sig Dispense Refill   acyclovir (ZOVIRAX) 400 MG tablet Take 1 tablet (400 mg total) by mouth 2 (two) times daily. 60 tablet 5   aluminum chloride (DRYSOL) 20 % external solution Apply 1 application topically at bedtime.     aspirin EC 81 MG tablet Take 81 mg by mouth daily.     calcium-vitamin D (OSCAL WITH D) 250-125 MG-UNIT tablet Take 1 tablet by mouth daily.     cyclobenzaprine (FLEXERIL) 10 MG tablet Take 10 mg by mouth 3 (three) times daily as needed for muscle spasms.     dexamethasone (DECADRON) 4 MG tablet Take 5 tabs (20 mg) weekly the day after daratumumab for 12 weeks. Take with breakfast. 20 tablet 5   diclofenac (VOLTAREN) 50 MG EC tablet Take 50 mg by mouth 2 (two) times daily.     diclofenac sodium (VOLTAREN) 1 % GEL Apply 2 g topically as needed.     ergocalciferol (VITAMIN D2) 1.25 MG (50000 UT) capsule Take 1 capsule (50,000  Units total) by mouth once a week. 12 capsule 2   esomeprazole (NEXIUM) 40 MG capsule Take 1 capsule (40 mg total) by mouth daily before breakfast. 30 capsule 1   ibuprofen (ADVIL,MOTRIN) 200 MG tablet Take 200 mg by mouth every 6 (six) hours as needed for moderate pain.     lidocaine (LIDODERM) 5 % Place 1 patch onto the skin daily. Remove & Discard patch within 12 hours or as directed by MD 30 patch 0   LORazepam (ATIVAN) 0.5 MG tablet Take 1 tablet (0.5 mg total) by mouth every 6 (six) hours as needed (Nausea or vomiting). 30 tablet 0   methylPREDNISolone (MEDROL DOSEPAK) 4 MG TBPK tablet Take 6 pills by mouth day 1, 5 on  day 2, 4 on day 3, 3 on day 4, 2 on day 5, 1 on day 6 21 tablet 0   Multiple Vitamin (MULTIVITAMIN) tablet Take 1 tablet by mouth daily.     Omega-3 Fatty Acids (FISH OIL) 1360 MG CAPS Take 1 capsule by mouth daily.     ondansetron (ZOFRAN) 8 MG tablet Take 1 tablet (8 mg total) by mouth every 8 (eight) hours as needed for nausea or vomiting. 30 tablet 0   oxyCODONE (OXY IR/ROXICODONE) 5 MG immediate release tablet Take 1-2 tablets (5-10 mg total) by mouth every 4 (four) hours as needed for severe pain or moderate pain (cancer related pain). 30 tablet 0   prochlorperazine (COMPAZINE) 10 MG tablet Take 1 tablet (10 mg total) by mouth every 6 (six) hours as needed (Nausea or vomiting). 30 tablet 1   REVLIMID 15 MG capsule Take 1 capsule (15 mg total) by mouth daily. Take for 14 days on and 7 days off. 14 capsule 0   senna-docusate (SENNA S) 8.6-50 MG tablet Take 2 tablets by mouth at bedtime. 60 tablet 1   terbinafine (LAMISIL) 1 % cream Apply 1 application topically 2 (two) times daily.     triamcinolone (KENALOG) 0.025 % cream Apply 1 application topically 2 (two) times daily.     No current facility-administered medications for this visit.    REVIEW OF SYSTEMS:    10 Point review of Systems was done is negative except as noted above.  PHYSICAL EXAMINATION: ECOG  PERFORMANCE STATUS: 1 - Symptomatic but completely ambulatory  . Vitals:   03/11/22 1226  BP: 123/73  Pulse: 88  Resp: 20  Temp: (!) 97.5 F (36.4 C)  SpO2: 100%   Filed Weights   03/11/22 1226  Weight: 207 lb 6.4 oz (94.1 kg)   .Body mass index is 33.48 kg/m. NAD GENERAL:alert, in no acute distress and comfortable SKIN: Grade 2 rash***, no significant lesions EYES: conjunctiva are pink and non-injected, sclera anicteric NECK: supple, no JVD LYMPH:  no palpable lymphadenopathy in the cervical, axillary or inguinal regions LUNGS: clear to auscultation b/l with normal respiratory effort HEART: regular rate & rhythm ABDOMEN:  normoactive bowel sounds , non tender, not distended. Extremity: 2+ pedal edema b/l PSYCH: alert & oriented x 3 with fluent speech NEURO: no focal motor/sensory deficits  Exam performed in chair.  LABORATORY DATA:  I have reviewed the data as listed  .    Latest Ref Rng & Units 03/08/2022    8:37 AM 03/01/2022    8:37 AM 02/23/2022    9:53 PM  CBC  WBC 4.0 - 10.5 K/uL 1.2  3.2  4.1   Hemoglobin 13.0 - 17.0 g/dL 12.3  12.1  11.8   Hematocrit 39.0 - 52.0 % 35.9  35.5  35.6   Platelets 150 - 400 K/uL 102  192  232     .    Latest Ref Rng & Units 03/08/2022    8:37 AM 03/01/2022    8:37 AM 02/23/2022    9:53 PM  CMP  Glucose 70 - 99 mg/dL 102  111  99   BUN 8 - 23 mg/dL 15  17  25    Creatinine 0.61 - 1.24 mg/dL 0.83  0.94  0.96   Sodium 135 - 145 mmol/L 130  132  132   Potassium 3.5 - 5.1 mmol/L 3.8  4.1  4.1   Chloride 98 - 111 mmol/L 97  99  96   CO2 22 -  32 mmol/L 26  25  25    Calcium 8.9 - 10.3 mg/dL 8.8  9.1  9.4   Total Protein 6.5 - 8.1 g/dL 6.5  7.7  8.6   Total Bilirubin 0.3 - 1.2 mg/dL 0.8  0.4  0.3   Alkaline Phos 38 - 126 U/L 92  73  53   AST 15 - 41 U/L 12  16  20    ALT 0 - 44 U/L 22  26  25     02/06/2022 Molecular pathology   RADIOGRAPHIC STUDIES: I have personally reviewed the radiological images as listed and agreed with  the findings in the report. CT ABDOMEN PELVIS W CONTRAST  Result Date: 02/23/2022 CLINICAL DATA:  Abdominal pain, acute, nonlocalized no BM in 6 days also not passing gas, abd distended. EXAM: CT ABDOMEN AND PELVIS WITH CONTRAST TECHNIQUE: Multidetector CT imaging of the abdomen and pelvis was performed using the standard protocol following bolus administration of intravenous contrast. RADIATION DOSE REDUCTION: This exam was performed according to the departmental dose-optimization program which includes automated exposure control, adjustment of the mA and/or kV according to patient size and/or use of iterative reconstruction technique. CONTRAST:  1109m OMNIPAQUE IOHEXOL 300 MG/ML  SOLN COMPARISON:  PET CT 01/28/2022 FINDINGS: Lower chest: No acute abnormality Hepatobiliary: No focal hepatic abnormality. Gallbladder unremarkable. Pancreas: No focal abnormality or ductal dilatation. Spleen: Calcifications throughout the spleen.  Normal size. Adrenals/Urinary Tract: 2.3 cm low-density right adrenal nodule again noted, likely adenoma. Left adrenal gland and kidneys unremarkable. No stones or hydronephrosis. Urinary bladder unremarkable. Stomach/Bowel: Moderate stool and gas throughout the colon. Stomach and small bowel decompressed, grossly unremarkable. Vascular/Lymphatic: Aortic atherosclerosis. No evidence of aneurysm or adenopathy. Reproductive: Prostate enlargement. Other: No free fluid or free air. Musculoskeletal: Lucencies throughout the visualized bony skeleton compatible with known history of multiple myeloma. Mild T12 compression fracture. IMPRESSION: Moderate distention of the colon with gas and stool. This could reflect constipation and/or ileus. Aortic atherosclerosis. Prostate enlargement. Extensive lytic lesions throughout the bony skeleton compatible with multiple myeloma. Mild T12 compression fracture. Electronically Signed   By: KRolm BaptiseM.D.   On: 02/23/2022 22:54     ASSESSMENT & PLAN:    61y.o. very pleasant male with  1. Newly diagnosed multiple myeloma with extensive bone metastases and patholgiic fracture rt shoulder -Recent bone marrow biopsy and aspiration done 02/06/2022 revealed hypercellular bone marrow with plasma cell neoplasm.  Plan -I discussed available labs with the patient in details Labs done 03/08/2022 were reviewed in detail. CBC shows reduced WBC count of 1.2k and  Hgb of 12.3. Platelets are normal at 102k. CMP shows elevated total protein at 6.5. -Patient's chart reviewed in detail -We discussed the diagnosis of multiple myeloma, the natural history, staging, initial evaluation, common symptoms, treatment options follow-up. -Continue Daratumumab/Velcade/Dexamethasone/Zometa and Revlimid treatment starting 03/15/2022 -Hold Revlimid at this time. -He will also start taking an acid suppressant prior to the Dexamethasone. -Aspirin for VTE prophylaxis with Revlimid -Take Senna as needed for constipation. -Will hold treatment today and holding Revlimid at this time. -We discussed building calcium stores with the goal of at least 1000 mg daily. Recommended drinking 2 glasses of milk per day and taking high dose vitamin D 2x per week. -Start taking Loratadine 10 mg p.o daily and Famotidine 40 mg p.o daily. -Start using fentanyl patches to reduce NSAID use. -Take Claritin over the counter 1x p.o daily.  Follow up: UKoreavenous bilateral lower extremities started today Please schedule cycle 2 of  daratumumab Velcade Revlimid dexamethasone starting 03/15/2022 Please add MD visit to appointment for treatment on 03/15/2022-we will see patient in infusion  All of the patients questions were answered with apparent satisfaction. The patient knows to call the clinic with any problems, questions or concerns.  I spent *** minutes counseling the patient face to face. The total time spent in the appointment was *** minutes and more than 50% was on counseling and direct  patient cares.    Sullivan Lone MD MS AAHIVMS [facility] Hematology/Oncology Physician [facility]  (Office):       [phone] (Work cell):  [phone] (Fax):           [phone]  I, Melene Muller, am acting as scribe for Dr. Sullivan Lone, MD.

## 2022-03-12 ENCOUNTER — Other Ambulatory Visit (HOSPITAL_COMMUNITY): Payer: Self-pay

## 2022-03-12 ENCOUNTER — Ambulatory Visit (HOSPITAL_COMMUNITY)
Admission: RE | Admit: 2022-03-12 | Discharge: 2022-03-12 | Disposition: A | Discharge status: Home / Self Care | Payer: BC Managed Care – PPO | Source: Ambulatory Visit | Attending: Hematology | Admitting: Hematology

## 2022-03-12 DIAGNOSIS — M47816 Spondylosis without myelopathy or radiculopathy, lumbar region: Secondary | ICD-10-CM | POA: Diagnosis not present

## 2022-03-12 DIAGNOSIS — M9903 Segmental and somatic dysfunction of lumbar region: Secondary | ICD-10-CM | POA: Diagnosis not present

## 2022-03-12 DIAGNOSIS — M5414 Radiculopathy, thoracic region: Secondary | ICD-10-CM | POA: Diagnosis not present

## 2022-03-12 DIAGNOSIS — M9902 Segmental and somatic dysfunction of thoracic region: Secondary | ICD-10-CM | POA: Diagnosis not present

## 2022-03-12 DIAGNOSIS — C9000 Multiple myeloma not having achieved remission: Secondary | ICD-10-CM | POA: Insufficient documentation

## 2022-03-12 MED ORDER — APIXABAN (ELIQUIS) VTE STARTER PACK (10MG AND 5MG): ORAL_TABLET | ORAL | 0 refills | Status: DC

## 2022-03-12 NOTE — Progress Notes (Signed)
Bilateral lower extremity venous duplex has been completed. Preliminary results can be found in CV Proc through chart review.  Results were given to [facility] at Dr. Grier Mitts office.  03/12/22 9:06 AM Anthony [name] RVT

## 2022-03-12 NOTE — Progress Notes (Signed)
Contacted pt per Dr Irene Limbo: to let pt know that he has a small DVT in the calf on the right leg but most of the swelling appears to be from edema related to steroids.  Will stop the baby aspirin and other NSAIDs.  Start on Eliquis starter pack -[place] [facility].  Continue diuretic.  Revlimid on hold at this time.  Pt acknowledged information and verbalized understanding.

## 2022-03-13 ENCOUNTER — Other Ambulatory Visit: Payer: Self-pay

## 2022-03-13 DIAGNOSIS — C9000 Multiple myeloma not having achieved remission: Secondary | ICD-10-CM

## 2022-03-13 DIAGNOSIS — Z7189 Other specified counseling: Secondary | ICD-10-CM

## 2022-03-14 ENCOUNTER — Ambulatory Visit (HOSPITAL_COMMUNITY)
Admission: RE | Admit: 2022-03-14 | Discharge: 2022-03-14 | Disposition: A | Discharge status: Home / Self Care | Payer: BC Managed Care – PPO | Source: Ambulatory Visit | Attending: Hematology | Admitting: Hematology

## 2022-03-14 ENCOUNTER — Encounter: Payer: Self-pay | Admitting: Hematology

## 2022-03-14 DIAGNOSIS — C9000 Multiple myeloma not having achieved remission: Secondary | ICD-10-CM

## 2022-03-14 DIAGNOSIS — M47812 Spondylosis without myelopathy or radiculopathy, cervical region: Secondary | ICD-10-CM | POA: Diagnosis not present

## 2022-03-14 DIAGNOSIS — M549 Dorsalgia, unspecified: Secondary | ICD-10-CM | POA: Diagnosis not present

## 2022-03-14 DIAGNOSIS — M50223 Other cervical disc displacement at C6-C7 level: Secondary | ICD-10-CM | POA: Diagnosis not present

## 2022-03-14 DIAGNOSIS — M5126 Other intervertebral disc displacement, lumbar region: Secondary | ICD-10-CM | POA: Diagnosis not present

## 2022-03-14 DIAGNOSIS — M50221 Other cervical disc displacement at C4-C5 level: Secondary | ICD-10-CM | POA: Diagnosis not present

## 2022-03-14 DIAGNOSIS — M4802 Spinal stenosis, cervical region: Secondary | ICD-10-CM | POA: Diagnosis not present

## 2022-03-14 MED ORDER — GADOBUTROL 1 MMOL/ML IV SOLN
9.0000 mL | Freq: Once | INTRAVENOUS | Status: AC | PRN
  Administered 2022-03-14: 9 mL via INTRAVENOUS

## 2022-03-14 MED ORDER — LORAZEPAM 0.5 MG PO TABS: 0.5000 mg | ORAL_TABLET | Freq: Four times a day (QID) | ORAL | 0 refills | Status: DC | PRN

## 2022-03-15 ENCOUNTER — Other Ambulatory Visit: Payer: Self-pay | Admitting: Hematology

## 2022-03-15 ENCOUNTER — Inpatient Hospital Stay (HOSPITAL_BASED_OUTPATIENT_CLINIC_OR_DEPARTMENT_OTHER): Payer: BC Managed Care – PPO | Admitting: Hematology

## 2022-03-15 ENCOUNTER — Other Ambulatory Visit: Payer: Self-pay | Admitting: Hematology and Oncology

## 2022-03-15 ENCOUNTER — Inpatient Hospital Stay: Payer: BC Managed Care – PPO

## 2022-03-15 ENCOUNTER — Other Ambulatory Visit: Payer: Self-pay

## 2022-03-15 VITALS — BP 117/84 | HR 92 | Temp 98.8°F | Resp 17 | Wt 197.2 lb

## 2022-03-15 DIAGNOSIS — Z7189 Other specified counseling: Secondary | ICD-10-CM

## 2022-03-15 DIAGNOSIS — G8929 Other chronic pain: Secondary | ICD-10-CM | POA: Diagnosis not present

## 2022-03-15 DIAGNOSIS — C9000 Multiple myeloma not having achieved remission: Secondary | ICD-10-CM

## 2022-03-15 DIAGNOSIS — M545 Low back pain, unspecified: Secondary | ICD-10-CM | POA: Diagnosis not present

## 2022-03-15 DIAGNOSIS — Z5112 Encounter for antineoplastic immunotherapy: Secondary | ICD-10-CM | POA: Diagnosis not present

## 2022-03-15 LAB — CBC WITH DIFFERENTIAL (CANCER CENTER ONLY)
Abs Immature Granulocytes: 0.01 10*3/uL (ref 0.00–0.07)
Basophils Absolute: 0 10*3/uL (ref 0.0–0.1)
Basophils Relative: 1 %
Eosinophils Absolute: 0.1 10*3/uL (ref 0.0–0.5)
Eosinophils Relative: 3 %
HCT: 37.7 % — ABNORMAL LOW (ref 39.0–52.0)
Hemoglobin: 12.8 g/dL — ABNORMAL LOW (ref 13.0–17.0)
Immature Granulocytes: 0 %
Lymphocytes Relative: 33 %
Lymphs Abs: 0.7 10*3/uL (ref 0.7–4.0)
MCH: 28.9 pg (ref 26.0–34.0)
MCHC: 34 g/dL (ref 30.0–36.0)
MCV: 85.1 fL (ref 80.0–100.0)
Monocytes Absolute: 0.4 10*3/uL (ref 0.1–1.0)
Monocytes Relative: 16 %
Neutro Abs: 1 10*3/uL — ABNORMAL LOW (ref 1.7–7.7)
Neutrophils Relative %: 47 %
Platelet Count: 237 10*3/uL (ref 150–400)
RBC: 4.43 MIL/uL (ref 4.22–5.81)
RDW: 15.9 % — ABNORMAL HIGH (ref 11.5–15.5)
WBC Count: 2.3 10*3/uL — ABNORMAL LOW (ref 4.0–10.5)
nRBC: 0 % (ref 0.0–0.2)

## 2022-03-15 LAB — CMP (CANCER CENTER ONLY)
ALT: 21 U/L (ref 0–44)
AST: 11 U/L — ABNORMAL LOW (ref 15–41)
Albumin: 3.8 g/dL (ref 3.5–5.0)
Alkaline Phosphatase: 149 U/L — ABNORMAL HIGH (ref 38–126)
Anion gap: 7 (ref 5–15)
BUN: 12 mg/dL (ref 8–23)
CO2: 25 mmol/L (ref 22–32)
Calcium: 7.8 mg/dL — ABNORMAL LOW (ref 8.9–10.3)
Chloride: 105 mmol/L (ref 98–111)
Creatinine: 0.76 mg/dL (ref 0.61–1.24)
GFR, Estimated: >60 mL/min (ref >60)
Glucose, Bld: 100 mg/dL — ABNORMAL HIGH (ref 70–99)
Potassium: 4.1 mmol/L (ref 3.5–5.1)
Sodium: 137 mmol/L (ref 135–145)
Total Bilirubin: 0.4 mg/dL (ref 0.3–1.2)
Total Protein: 7 g/dL (ref 6.5–8.1)

## 2022-03-15 MED ORDER — DIPHENHYDRAMINE HCL 25 MG PO CAPS
50.0000 mg | ORAL_CAPSULE | Freq: Once | ORAL | Status: AC
  Administered 2022-03-15: 50 mg via ORAL
  Filled 2022-03-15: qty 2

## 2022-03-15 MED ORDER — SODIUM CHLORIDE 0.9 % IV SOLN
20.0000 mg | Freq: Once | INTRAVENOUS | Status: AC
  Administered 2022-03-15: 20 mg via INTRAVENOUS
  Filled 2022-03-15: qty 20

## 2022-03-15 MED ORDER — FAMOTIDINE IN NACL 20-0.9 MG/50ML-% IV SOLN
20.0000 mg | Freq: Once | INTRAVENOUS | Status: AC
  Administered 2022-03-15: 20 mg via INTRAVENOUS
  Filled 2022-03-15: qty 50

## 2022-03-15 MED ORDER — SODIUM CHLORIDE 0.9 % IV SOLN: Freq: Once | INTRAVENOUS | Status: AC

## 2022-03-15 MED ORDER — ACETAMINOPHEN 325 MG PO TABS
650.0000 mg | ORAL_TABLET | Freq: Once | ORAL | Status: AC
  Administered 2022-03-15: 650 mg via ORAL
  Filled 2022-03-15: qty 2

## 2022-03-15 MED ORDER — SODIUM CHLORIDE 0.9 % IV SOLN
16.0000 mg/kg | Freq: Once | INTRAVENOUS | Status: AC
  Administered 2022-03-15: 1500 mg via INTRAVENOUS
  Filled 2022-03-15: qty 60

## 2022-03-15 NOTE — Progress Notes (Signed)
Per Dr. Lorenso Courier ok to treat with ANC of 1.0 today

## 2022-03-15 NOTE — Progress Notes (Signed)
HEMATOLOGY/ONCOLOGY CLINIC NOTE  Date of Service: 03/15/2022  Patient Care Team: Lennie Odor, [place] as PCP - General (Nurse Practitioner)  CHIEF COMPLAINTS/PURPOSE OF CONSULTATION:  Evaluation and management of recently diagnosed multiple myeloma  HISTORY OF PRESENTING ILLNESS:   Anthony [name] is a wonderful 61 y.o. male who has been referred to [place] by Dr Lennie Odor, PA for evaluation and management of newly diagnosed multiple myeloma. He reports He is doing well.  He reports persistent lower back pain that he has had since he was in his 20's. He notes no previous significant injuries. He further notes that he gets chiropractic adjustments and takes Tylenol to manage his symptoms. He notes less back pain when standing up on his right leg and maintaining weight on his right leg.  He reports previous history of smoking and chewing tobacco over 40 years ago.  He had a recent fall back in February of this year with only a pulled muscle. And he notes another fall when chasing his cat where he fell on his right shoulder. He reports pain in right shoulder.   He had a recent COVID-19 infection back in March.  He reports intermittent cough with SOB. He notes he takes 2 Rolaids at night. We discussed potentially trying antacids which he is agreeable to as he will begin steroids soon and was advised of the possible symptoms and side effects from taking steroids.  We discussed CRAB criteria and that he meets at least two of the criterion being  anemia and bone disease.  We discussed getting additional scans for further evaluation which he is agreeable to.  We further discussed starting Daratumumab/Velcade/Dexamethasone/Zometa and Revlimid for treatment which he is agreeable to. We also discussed starting steroids before treatment and taking an acid suppressant which he was also agreeable to.  We discussed getting Senna and taking it as needed for constipation.  Labs done today were  reviewed in detail.  We discussed his recent bone marrow biopsy and aspiration done 02/06/2022.  We discussed PET/CT scan done 01/28/2022.  We discussed CT right shoulder w/o contrast done 11/13/2021  INTERVAL HISTORY:  Anthony [name] is a 61 y.o. male is here for Evaluation and management of newly diagnosed multiple myeloma. He reports He is doing well with no new symptoms or concerns.  He reports pain in left upper quadrant in ribs. He notes he took 2 Oxycodone which has helped.  He notes previous rash has mostly resolved.  We discussed managing pedal edema with compression socks.  He is tolerating Daratumumab/Velcade/Dexamethasone/Zometa and Revlimid treatment well with no significantly toxicity other than grade 2 rash on trunk and extremities which is resolving. 1-2+ pedal edema b/l  MRI spine pending  No other new or acute focal symptoms.  Labs done today were reviewed in detail.  MEDICAL HISTORY:  Past Medical History:  Diagnosis Date   Back pain    Cancer (Anthony [name])    Multiple myeloma (Anthony [name]) 02/15/2022    SURGICAL HISTORY: No past surgical history on file.  SOCIAL HISTORY: Social History   Socioeconomic History   Marital status: Married    Spouse name: Not on file   Number of children: Not on file   Years of education: Not on file   Highest education level: Not on file  Occupational History   Not on file  Tobacco Use   Smoking status: Never   Smokeless tobacco: Former    Types: Chew, Snuff  Vaping Use   Vaping Use: Never  used  Substance and Sexual Activity   Alcohol use: Yes    Alcohol/week: 2.0 standard drinks of alcohol    Types: 2 Glasses of wine per week   Drug use: No   Sexual activity: Yes  Other Topics Concern   Not on file  Social History Narrative   Not on file   Social Determinants of Health   Financial Resource Strain: Not on file  Food Insecurity: Not on file  Transportation Needs: Not on file  Physical Activity: Not on file   Stress: Not on file  Social Connections: Not on file  Intimate Partner Violence: Not on file    FAMILY HISTORY: Family History  Problem Relation Age of Onset   Rheum arthritis Mother    Other Sister        Pre-cancerous uterine mass;    Rheum arthritis Sister    Bone cancer Paternal Grandfather    Brain cancer Maternal Uncle    Brain cancer Maternal Aunt    Osteoporosis Neg Hx     ALLERGIES:  has No Known Allergies.  MEDICATIONS:  Current Outpatient Medications  Medication Sig Dispense Refill   acyclovir (ZOVIRAX) 400 MG tablet Take 1 tablet (400 mg total) by mouth 2 (two) times daily. 60 tablet 5   aluminum chloride (DRYSOL) 20 % external solution Apply 1 application topically at bedtime.     APIXABAN (ELIQUIS) VTE STARTER PACK (10MG AND 5MG) Take as directed on package: start with two-67m tablets twice daily for 7 days. On day 8, switch to one-555mtablet twice daily. 1 each 0   calcium-vitamin D (OSCAL WITH D) 250-125 MG-UNIT tablet Take 1 tablet by mouth daily.     cyclobenzaprine (FLEXERIL) 10 MG tablet Take 10 mg by mouth 3 (three) times daily as needed for muscle spasms.     dexamethasone (DECADRON) 4 MG tablet Take 5 tabs (20 mg) weekly the day after daratumumab for 12 weeks. Take with breakfast. 20 tablet 5   diclofenac sodium (VOLTAREN) 1 % GEL Apply 2 g topically as needed.     ergocalciferol (VITAMIN D2) 1.25 MG (50000 UT) capsule Take 1 capsule (50,000 Units total) by mouth once a week. 12 capsule 2   esomeprazole (NEXIUM) 40 MG capsule Take 1 capsule (40 mg total) by mouth daily before breakfast. 30 capsule 1   fentaNYL (DURAGESIC) 12 MCG/HR Place 1 patch onto the skin every 3 days. 5 patch 0   furosemide (LASIX) 20 MG tablet Take 1 tablet (20 mg total) by mouth daily. 30 tablet 1   lidocaine (LIDODERM) 5 % Place 1 patch onto the skin daily. Remove & Discard patch within 12 hours or as directed by MD 30 patch 0   LORazepam (ATIVAN) 0.5 MG tablet Take 1 tablet (0.5 mg  total) by mouth every 6 (six) hours as needed (Nausea or vomiting). 30 tablet 0   methylPREDNISolone (MEDROL DOSEPAK) 4 MG TBPK tablet Take 6 pills by mouth day 1, 5 on day 2, 4 on day 3, 3 on day 4, 2 on day 5, 1 on day 6 21 tablet 0   Multiple Vitamin (MULTIVITAMIN) tablet Take 1 tablet by mouth daily.     Omega-3 Fatty Acids (FISH OIL) 1360 MG CAPS Take 1 capsule by mouth daily.     ondansetron (ZOFRAN) 8 MG tablet Take 1 tablet (8 mg total) by mouth every 8 (eight) hours as needed for nausea or vomiting. 30 tablet 0   oxyCODONE (OXY IR/ROXICODONE) 5 MG immediate release  tablet Take 1-2 tablets (5-10 mg total) by mouth every 4 (four) hours as needed for severe pain or moderate pain (cancer related pain). 60 tablet 0   prochlorperazine (COMPAZINE) 10 MG tablet Take 1 tablet (10 mg total) by mouth every 6 (six) hours as needed (Nausea or vomiting). 30 tablet 1   REVLIMID 15 MG capsule Take 1 capsule (15 mg total) by mouth daily. Take for 14 days on and 7 days off. 14 capsule 0   senna-docusate (SENNA S) 8.6-50 MG tablet Take 2 tablets by mouth at bedtime. 60 tablet 1   terbinafine (LAMISIL) 1 % cream Apply 1 application topically 2 (two) times daily.     No current facility-administered medications for this visit.   Facility-Administered Medications Ordered in Other Visits  Medication Dose Route Frequency Provider Last Rate Last Admin   acetaminophen (TYLENOL) tablet 650 mg  650 mg Oral Once Orson Slick, MD       daratumumab [facility]) 1,500 mg in sodium chloride 0.9 % 425 mL (3 mg/mL) chemo infusion  16 mg/kg (Treatment Plan Recorded) Intravenous Once Orson Slick, MD       dexamethasone (DECADRON) 20 mg in sodium chloride 0.9 % 50 mL IVPB  20 mg Intravenous Once Ledell Peoples IV, MD       diphenhydrAMINE (BENADRYL) capsule 50 mg  50 mg Oral Once Orson Slick, MD       famotidine (PEPCID) IVPB 20 mg premix  20 mg Intravenous Once Orson Slick, MD        REVIEW OF  SYSTEMS:    10 Point review of Systems was done is negative except as noted above.  PHYSICAL EXAMINATION: ECOG PERFORMANCE STATUS: 1 - Symptomatic but completely ambulatory VS reviewed NAD GENERAL:alert, in no acute distress and comfortable SKIN: no acute rashes, no significant lesions EYES: conjunctiva are pink and non-injected, sclera anicteric NECK: supple, no JVD LYMPH:  no palpable lymphadenopathy in the cervical, axillary or inguinal regions LUNGS: clear to auscultation b/l with normal respiratory effort HEART: regular rate & rhythm ABDOMEN:  normoactive bowel sounds , non tender, not distended. Extremity: 1+ pedal edema in left foot. Trace pedal edema in right foot. PSYCH: alert & oriented x 3 with fluent speech NEURO: no focal motor/sensory deficits  Exam performed in chair.  LABORATORY DATA:  I have reviewed the data as listed  .    Latest Ref Rng & Units 03/15/2022    8:13 AM 03/08/2022    8:37 AM 03/01/2022    8:37 AM  CBC  WBC 4.0 - 10.5 K/uL 2.3  1.2  3.2   Hemoglobin 13.0 - 17.0 g/dL 12.8  12.3  12.1   Hematocrit 39.0 - 52.0 % 37.7  35.9  35.5   Platelets 150 - 400 K/uL 237  102  192     .    Latest Ref Rng & Units 03/15/2022    8:13 AM 03/08/2022    8:37 AM 03/01/2022    8:37 AM  CMP  Glucose 70 - 99 mg/dL 100  102  111   BUN 8 - 23 mg/dL 12  15  17    Creatinine 0.61 - 1.24 mg/dL 0.76  0.83  0.94   Sodium 135 - 145 mmol/L 137  130  132   Potassium 3.5 - 5.1 mmol/L 4.1  3.8  4.1   Chloride 98 - 111 mmol/L 105  97  99   CO2 22 - 32 mmol/L  25  26  25    Calcium 8.9 - 10.3 mg/dL 7.8  8.8  9.1   Total Protein 6.5 - 8.1 g/dL 7.0  6.5  7.7   Total Bilirubin 0.3 - 1.2 mg/dL 0.4  0.8  0.4   Alkaline Phos 38 - 126 U/L 149  92  73   AST 15 - 41 U/L 11  12  16    ALT 0 - 44 U/L 21  22  26     02/06/2022 Molecular pathology   RADIOGRAPHIC STUDIES: I have personally reviewed the radiological images as listed and agreed with the findings in the report. VAS [place]  LOWER EXTREMITY VENOUS (DVT)  Result Date: 03/12/2022  Lower Venous DVT Study Patient Name:  AZEL GUMINA  Date of Exam:   03/12/2022 Medical Rec #: [id number]        Accession #:    [id number] Date of Birth: Jan 04, 1961        Patient Gender: M Patient Age:   65 years Exam Location:  [facility] Procedure:      VAS [place] LOWER EXTREMITY VENOUS (DVT) Referring Phys: Sullivan Lone --------------------------------------------------------------------------------  Indications: Swelling.  Risk Factors: Cancer. Limitations: Poor ultrasound/tissue interface. Comparison Study: No prior studies. Performing Technologist: Oliver Hum RVT  Examination Guidelines: A complete evaluation includes B-mode imaging, spectral Doppler, color Doppler, and power Doppler as needed of all accessible portions of each vessel. Bilateral testing is considered an integral part of a complete examination. Limited examinations for reoccurring indications may be performed as noted. The reflux portion of the exam is performed with the patient in reverse Trendelenburg.  +---------+---------------+---------+-----------+----------+--------------+ RIGHT    CompressibilityPhasicitySpontaneityPropertiesThrombus Aging +---------+---------------+---------+-----------+----------+--------------+ CFV      Full           Yes      Yes                                 +---------+---------------+---------+-----------+----------+--------------+ SFJ      Full                                                        +---------+---------------+---------+-----------+----------+--------------+ FV Prox  Full                                                        +---------+---------------+---------+-----------+----------+--------------+ FV Mid   Full                                                        +---------+---------------+---------+-----------+----------+--------------+ FV DistalFull                                                         +---------+---------------+---------+-----------+----------+--------------+ PFV      Full                                                        +---------+---------------+---------+-----------+----------+--------------+  POP      Full           Yes      Yes                                 +---------+---------------+---------+-----------+----------+--------------+ PTV      Partial                                      Acute          +---------+---------------+---------+-----------+----------+--------------+ PERO     Full                                                        +---------+---------------+---------+-----------+----------+--------------+   +---------+---------------+---------+-----------+----------+--------------+ LEFT     CompressibilityPhasicitySpontaneityPropertiesThrombus Aging +---------+---------------+---------+-----------+----------+--------------+ CFV      Full           Yes      Yes                                 +---------+---------------+---------+-----------+----------+--------------+ SFJ      Full                                                        +---------+---------------+---------+-----------+----------+--------------+ FV Prox  Full                                                        +---------+---------------+---------+-----------+----------+--------------+ FV Mid   Full                                                        +---------+---------------+---------+-----------+----------+--------------+ FV DistalFull                                                        +---------+---------------+---------+-----------+----------+--------------+ PFV      Full                                                        +---------+---------------+---------+-----------+----------+--------------+ POP      Full           Yes      Yes                                  +---------+---------------+---------+-----------+----------+--------------+  PTV      Full                                                        +---------+---------------+---------+-----------+----------+--------------+ PERO     Full                                                        +---------+---------------+---------+-----------+----------+--------------+     Summary: RIGHT: - Findings consistent with acute deep vein thrombosis involving the right posterior tibial veins. - No cystic structure found in the popliteal fossa.  LEFT: - There is no evidence of deep vein thrombosis in the lower extremity.  - No cystic structure found in the popliteal fossa.  *See table(s) above for measurements and observations. Electronically signed by Deitra Mayo MD on 03/12/2022 at 2:22:06 PM.    Final    CT ABDOMEN PELVIS W CONTRAST  Result Date: 02/23/2022 CLINICAL DATA:  Abdominal pain, acute, nonlocalized no BM in 6 days also not passing gas, abd distended. EXAM: CT ABDOMEN AND PELVIS WITH CONTRAST TECHNIQUE: Multidetector CT imaging of the abdomen and pelvis was performed using the standard protocol following bolus administration of intravenous contrast. RADIATION DOSE REDUCTION: This exam was performed according to the departmental dose-optimization program which includes automated exposure control, adjustment of the mA and/or kV according to patient size and/or use of iterative reconstruction technique. CONTRAST:  125m OMNIPAQUE IOHEXOL 300 MG/ML  SOLN COMPARISON:  PET CT 01/28/2022 FINDINGS: Lower chest: No acute abnormality Hepatobiliary: No focal hepatic abnormality. Gallbladder unremarkable. Pancreas: No focal abnormality or ductal dilatation. Spleen: Calcifications throughout the spleen.  Normal size. Adrenals/Urinary Tract: 2.3 cm low-density right adrenal nodule again noted, likely adenoma. Left adrenal gland and kidneys unremarkable. No stones or hydronephrosis. Urinary bladder  unremarkable. Stomach/Bowel: Moderate stool and gas throughout the colon. Stomach and small bowel decompressed, grossly unremarkable. Vascular/Lymphatic: Aortic atherosclerosis. No evidence of aneurysm or adenopathy. Reproductive: Prostate enlargement. Other: No free fluid or free air. Musculoskeletal: Lucencies throughout the visualized bony skeleton compatible with known history of multiple myeloma. Mild T12 compression fracture. IMPRESSION: Moderate distention of the colon with gas and stool. This could reflect constipation and/or ileus. Aortic atherosclerosis. Prostate enlargement. Extensive lytic lesions throughout the bony skeleton compatible with multiple myeloma. Mild T12 compression fracture. Electronically Signed   By: KRolm BaptiseM.D.   On: 02/23/2022 22:54     ASSESSMENT & PLAN:   61y.o. very pleasant male with  1. Newly diagnosed multiple myeloma with extensive bone metastases and patholgiic fracture rt shoulder -Recent bone marrow biopsy and aspiration done 02/06/2022 revealed hypercellular bone marrow with plasma cell neoplasm.  Plan -I discussed available labs with the patient in details Labs done 03/15/2022 were reviewed in detail. CBC shows reduced WBC count of 2.3k and  Hgb of 12.8. Platelets are normal at 237k. CMP stable. Calcium is 7.8. -Patient's chart reviewed in detail -We discussed the diagnosis of multiple myeloma, the natural history, staging, initial evaluation, common symptoms, treatment options follow-up. -Continue Daratumumab/Zometa treatment C2 starting 03/15/2022. -will reintroduce Velcade on C2D8 if no rashes and Revlimid there after. -Hold Revlimid at this time. -He will also start taking  an acid suppressant prior to the Dexamethasone. -was Aspirin for VTE prophylaxis with Revlimid-- switched to Eliquis after noted to have calfDVT on US venous lowerextremities.  -Take Senna as needed for constipation. -Will hold treatment today and holding Revlimid at this  time. -We discussed building calcium stores with the goal of at least 1000 mg daily. Recommended drinking 2 glasses of milk per day and taking high dose vitamin D 2x per week. -Start taking Loratadine 10 mg p.o daily and Famotidine 40 mg p.o daily for rash suppression -Start using fentanyl patches to reduce NSAID use. -Take Claritin over the counter 1x p.o daily. -Only take Compazine as needed. -We will cancel cycle 2-day 4 of Velcade  Follow up: Please cancel cycle 2-day 4 of Velcade Please add MD visit to see the patient in infusion on cycle 2-day 8 with daratumumab Velcade in 1 week.  .The total time spent in the appointment was 41 minutes* .  All of the patient's questions were answered with apparent satisfaction. The patient knows to call the clinic with any problems, questions or concerns.   Sullivan Lone MD MS AAHIVMS [facility] Hematology/Oncology Physician [facility]  .*Total Encounter Time as defined by the Centers for Medicare and Medicaid Services includes, in addition to the face-to-face time of a patient visit (documented in the note above) non-face-to-face time: obtaining and reviewing outside history, ordering and reviewing medications, tests or procedures, care coordination (communications with other health care professionals or caregivers) and documentation in the medical record.  I, Melene Muller, am acting as scribe for Dr. Sullivan Lone, MD.  .I have reviewed the above documentation for accuracy and completeness, and I agree with the above. Brunetta Genera MD

## 2022-03-15 NOTE — Patient Instructions (Signed)
[facility]  Discharge Instructions: Thank you for choosing [facility] to provide your oncology and hematology care.   If you have a lab appointment with the [facility], please go directly to the [facility] and check in at the registration area.   Wear comfortable clothing and clothing appropriate for easy access to any Portacath or PICC line.   We strive to give you quality time with your provider. You may need to reschedule your appointment if you arrive late (15 or more minutes).  Arriving late affects you and other patients whose appointments are after yours.  Also, if you miss three or more appointments without notifying the office, you may be dismissed from the clinic at the provider's discretion.      For prescription refill requests, have your pharmacy contact our office and allow 72 hours for refills to be completed.    Today you received the following chemotherapy and/or immunotherapy agents Darzalex      To help prevent nausea and vomiting after your treatment, we encourage you to take your nausea medication as directed.  BELOW ARE SYMPTOMS THAT SHOULD BE REPORTED IMMEDIATELY: *FEVER GREATER THAN 100.4 F (38 C) OR HIGHER *CHILLS OR SWEATING *NAUSEA AND VOMITING THAT IS NOT CONTROLLED WITH YOUR NAUSEA MEDICATION *UNUSUAL SHORTNESS OF BREATH *UNUSUAL BRUISING OR BLEEDING *URINARY PROBLEMS (pain or burning when urinating, or frequent urination) *BOWEL PROBLEMS (unusual diarrhea, constipation, pain near the anus) TENDERNESS IN MOUTH AND THROAT WITH OR WITHOUT PRESENCE OF ULCERS (sore throat, sores in mouth, or a toothache) UNUSUAL RASH, SWELLING OR PAIN  UNUSUAL VAGINAL DISCHARGE OR ITCHING   Items with * indicate a potential emergency and should be followed up as soon as possible or go to the Emergency Department if any problems should occur.  Please show the CHEMOTHERAPY ALERT CARD or IMMUNOTHERAPY ALERT CARD at check-in to  the Emergency Department and triage nurse.  Should you have questions after your visit or need to cancel or reschedule your appointment, please contact [facility]  Dept: [phone]  and follow the prompts.  Office hours are 8:00 a.m. to 4:30 p.m. Monday - Friday. Please note that voicemails left after 4:00 p.m. may not be returned until the following business day.  We are closed weekends and major holidays. You have access to a nurse at all times for urgent questions. Please call the main number to the clinic Dept: [phone] and follow the prompts.   For any non-urgent questions, you may also contact your provider using MyChart. We now offer e-Visits for anyone 56 and older to request care online for non-urgent symptoms. For details visit [email or web address].   Also download the MyChart app! Go to the app store, search "MyChart", open the app, select [facility], and log in with your MyChart username and password.  Masks are optional in the cancer centers. If you would like for your care team to wear a mask while they are taking care of you, please let them know. For doctor visits, patients may have with them one support person who is at least 62 years old. At this time, visitors are not allowed in the infusion area.

## 2022-03-18 ENCOUNTER — Encounter: Payer: Self-pay | Admitting: Hematology

## 2022-03-18 ENCOUNTER — Inpatient Hospital Stay: Payer: BC Managed Care – PPO

## 2022-03-20 ENCOUNTER — Other Ambulatory Visit: Payer: Self-pay

## 2022-03-20 DIAGNOSIS — C9000 Multiple myeloma not having achieved remission: Secondary | ICD-10-CM

## 2022-03-21 ENCOUNTER — Encounter: Payer: Self-pay | Admitting: Hematology

## 2022-03-21 MED FILL — Dexamethasone Sodium Phosphate Inj 100 MG/10ML: INTRAMUSCULAR | Qty: 2 | Status: AC

## 2022-03-22 ENCOUNTER — Inpatient Hospital Stay (HOSPITAL_BASED_OUTPATIENT_CLINIC_OR_DEPARTMENT_OTHER): Payer: BC Managed Care – PPO | Admitting: Hematology

## 2022-03-22 ENCOUNTER — Other Ambulatory Visit: Payer: Self-pay | Admitting: Hematology

## 2022-03-22 ENCOUNTER — Inpatient Hospital Stay: Payer: BC Managed Care – PPO | Attending: Physician Assistant

## 2022-03-22 ENCOUNTER — Inpatient Hospital Stay: Payer: BC Managed Care – PPO | Admitting: Dietician

## 2022-03-22 ENCOUNTER — Inpatient Hospital Stay: Payer: BC Managed Care – PPO

## 2022-03-22 ENCOUNTER — Other Ambulatory Visit: Payer: Self-pay

## 2022-03-22 VITALS — BP 123/80 | HR 97 | Temp 97.7°F | Resp 16

## 2022-03-22 DIAGNOSIS — Z5112 Encounter for antineoplastic immunotherapy: Secondary | ICD-10-CM | POA: Insufficient documentation

## 2022-03-22 DIAGNOSIS — G893 Neoplasm related pain (acute) (chronic): Secondary | ICD-10-CM

## 2022-03-22 DIAGNOSIS — M545 Low back pain, unspecified: Secondary | ICD-10-CM | POA: Insufficient documentation

## 2022-03-22 DIAGNOSIS — C9000 Multiple myeloma not having achieved remission: Secondary | ICD-10-CM

## 2022-03-22 DIAGNOSIS — R059 Cough, unspecified: Secondary | ICD-10-CM | POA: Diagnosis not present

## 2022-03-22 DIAGNOSIS — G8929 Other chronic pain: Secondary | ICD-10-CM | POA: Diagnosis not present

## 2022-03-22 DIAGNOSIS — Z7189 Other specified counseling: Secondary | ICD-10-CM

## 2022-03-22 DIAGNOSIS — R21 Rash and other nonspecific skin eruption: Secondary | ICD-10-CM | POA: Diagnosis not present

## 2022-03-22 DIAGNOSIS — Z7961 Long term (current) use of immunomodulator: Secondary | ICD-10-CM | POA: Diagnosis not present

## 2022-03-22 DIAGNOSIS — R0602 Shortness of breath: Secondary | ICD-10-CM | POA: Insufficient documentation

## 2022-03-22 LAB — CBC WITH DIFFERENTIAL (CANCER CENTER ONLY)
Abs Immature Granulocytes: 0 10*3/uL (ref 0.00–0.07)
Basophils Absolute: 0 10*3/uL (ref 0.0–0.1)
Basophils Relative: 1 %
Eosinophils Absolute: 0.1 10*3/uL (ref 0.0–0.5)
Eosinophils Relative: 3 %
HCT: 41.9 % (ref 39.0–52.0)
Hemoglobin: 14 g/dL (ref 13.0–17.0)
Immature Granulocytes: 0 %
Lymphocytes Relative: 38 %
Lymphs Abs: 1.2 10*3/uL (ref 0.7–4.0)
MCH: 28.5 pg (ref 26.0–34.0)
MCHC: 33.4 g/dL (ref 30.0–36.0)
MCV: 85.2 fL (ref 80.0–100.0)
Monocytes Absolute: 0.2 10*3/uL (ref 0.1–1.0)
Monocytes Relative: 6 %
Neutro Abs: 1.6 10*3/uL — ABNORMAL LOW (ref 1.7–7.7)
Neutrophils Relative %: 52 %
Platelet Count: 215 10*3/uL (ref 150–400)
RBC: 4.92 MIL/uL (ref 4.22–5.81)
RDW: 15.8 % — ABNORMAL HIGH (ref 11.5–15.5)
WBC Count: 3 10*3/uL — ABNORMAL LOW (ref 4.0–10.5)
nRBC: 0 % (ref 0.0–0.2)

## 2022-03-22 LAB — CMP (CANCER CENTER ONLY)
ALT: 20 U/L (ref 0–44)
AST: 13 U/L — ABNORMAL LOW (ref 15–41)
Albumin: 3.9 g/dL (ref 3.5–5.0)
Alkaline Phosphatase: 186 U/L — ABNORMAL HIGH (ref 38–126)
Anion gap: 8 (ref 5–15)
BUN: 13 mg/dL (ref 8–23)
CO2: 24 mmol/L (ref 22–32)
Calcium: 8.3 mg/dL — ABNORMAL LOW (ref 8.9–10.3)
Chloride: 104 mmol/L (ref 98–111)
Creatinine: 0.77 mg/dL (ref 0.61–1.24)
GFR, Estimated: >60 mL/min (ref >60)
Glucose, Bld: 115 mg/dL — ABNORMAL HIGH (ref 70–99)
Potassium: 4.2 mmol/L (ref 3.5–5.1)
Sodium: 136 mmol/L (ref 135–145)
Total Bilirubin: 0.4 mg/dL (ref 0.3–1.2)
Total Protein: 7.1 g/dL (ref 6.5–8.1)

## 2022-03-22 MED ORDER — SODIUM CHLORIDE 0.9 % IV SOLN
16.0000 mg/kg | Freq: Once | INTRAVENOUS | Status: AC
  Administered 2022-03-22: 1500 mg via INTRAVENOUS
  Filled 2022-03-22: qty 60

## 2022-03-22 MED ORDER — BORTEZOMIB CHEMO SQ INJECTION 3.5 MG (2.5MG/ML): 1.3000 mg/m2 | Freq: Once | INTRAMUSCULAR | Status: DC

## 2022-03-22 MED ORDER — FAMOTIDINE IN NACL 20-0.9 MG/50ML-% IV SOLN
20.0000 mg | Freq: Once | INTRAVENOUS | Status: AC
  Administered 2022-03-22: 20 mg via INTRAVENOUS
  Filled 2022-03-22: qty 50

## 2022-03-22 MED ORDER — SODIUM CHLORIDE 0.9 % IV SOLN
20.0000 mg | Freq: Once | INTRAVENOUS | Status: AC
  Administered 2022-03-22: 20 mg via INTRAVENOUS
  Filled 2022-03-22: qty 20

## 2022-03-22 MED ORDER — DIPHENHYDRAMINE HCL 25 MG PO CAPS
50.0000 mg | ORAL_CAPSULE | Freq: Once | ORAL | Status: AC
  Administered 2022-03-22: 50 mg via ORAL
  Filled 2022-03-22: qty 2

## 2022-03-22 MED ORDER — FENTANYL 25 MCG/HR TD PT72
1.0000 | MEDICATED_PATCH | TRANSDERMAL | 0 refills | Status: DC
  Filled 2022-03-22: qty 10, 30d supply, fill #0

## 2022-03-22 MED ORDER — SODIUM CHLORIDE 0.9 % IV SOLN: Freq: Once | INTRAVENOUS | Status: AC

## 2022-03-22 MED ORDER — ACETAMINOPHEN 325 MG PO TABS
650.0000 mg | ORAL_TABLET | Freq: Once | ORAL | Status: AC
  Administered 2022-03-22: 650 mg via ORAL
  Filled 2022-03-22: qty 2

## 2022-03-22 NOTE — Patient Instructions (Signed)
[facility]  Discharge Instructions: Thank you for choosing [facility] to provide your oncology and hematology care.   If you have a lab appointment with the [facility], please go directly to the [facility] and check in at the registration area.   Wear comfortable clothing and clothing appropriate for easy access to any Portacath or PICC line.   We strive to give you quality time with your provider. You may need to reschedule your appointment if you arrive late (15 or more minutes).  Arriving late affects you and other patients whose appointments are after yours.  Also, if you miss three or more appointments without notifying the office, you may be dismissed from the clinic at the provider's discretion.      For prescription refill requests, have your pharmacy contact our office and allow 72 hours for refills to be completed.    Today you received the following chemotherapy and/or immunotherapy agents Darzalex      To help prevent nausea and vomiting after your treatment, we encourage you to take your nausea medication as directed.  BELOW ARE SYMPTOMS THAT SHOULD BE REPORTED IMMEDIATELY: *FEVER GREATER THAN 100.4 F (38 C) OR HIGHER *CHILLS OR SWEATING *NAUSEA AND VOMITING THAT IS NOT CONTROLLED WITH YOUR NAUSEA MEDICATION *UNUSUAL SHORTNESS OF BREATH *UNUSUAL BRUISING OR BLEEDING *URINARY PROBLEMS (pain or burning when urinating, or frequent urination) *BOWEL PROBLEMS (unusual diarrhea, constipation, pain near the anus) TENDERNESS IN MOUTH AND THROAT WITH OR WITHOUT PRESENCE OF ULCERS (sore throat, sores in mouth, or a toothache) UNUSUAL RASH, SWELLING OR PAIN  UNUSUAL VAGINAL DISCHARGE OR ITCHING   Items with * indicate a potential emergency and should be followed up as soon as possible or go to the Emergency Department if any problems should occur.  Please show the CHEMOTHERAPY ALERT CARD or IMMUNOTHERAPY ALERT CARD at check-in to  the Emergency Department and triage nurse.  Should you have questions after your visit or need to cancel or reschedule your appointment, please contact [facility]  Dept: [phone]  and follow the prompts.  Office hours are 8:00 a.m. to 4:30 p.m. Monday - Friday. Please note that voicemails left after 4:00 p.m. may not be returned until the following business day.  We are closed weekends and major holidays. You have access to a nurse at all times for urgent questions. Please call the main number to the clinic Dept: [phone] and follow the prompts.   For any non-urgent questions, you may also contact your provider using MyChart. We now offer e-Visits for anyone 90 and older to request care online for non-urgent symptoms. For details visit [email or web address].   Also download the MyChart app! Go to the app store, search "MyChart", open the app, select [facility], and log in with your MyChart username and password.  Masks are optional in the cancer centers. If you would like for your care team to wear a mask while they are taking care of you, please let them know. For doctor visits, patients may have with them one support person who is at least 62 years old. At this time, visitors are not allowed in the infusion area.

## 2022-03-22 NOTE — Progress Notes (Signed)
Nutrition Assessment   Reason for Assessment: MST   ASSESSMENT: 61 year old male with myeloma. He is receiving DaraVRd q21d + zometa q28d. Patient is under the care of Dr. Irene Limbo.   Met with patient during infusion. He reports appetite and intake are are ~50% from baseline. Breakfast is a bowl of raisin bran with 5 prunes, lunch is a sandwich (ham, 2 slices cheese, mayo on whole wheat), dinner is meat with veggies. Last night he had 3oz steak with cucumber, tomato, avocado salad. He had small slice of pecan pie. Patient is drinking 2 (16 oz) glasses of milk as well as 64-80 ounces water. He likes ice cream and will have a bowl in the evenings occasionally. Patient denies nausea, vomiting, diarrhea. He is on bowel regimen (senna BID) This is working well for him. Patient reports improving lower extremity edema. He is taking Lasix daily.   Nutrition Focused Physical Exam: deferred   Medications: acylovir, eliquis, oscal decadron, D2, nexium, lasix 74m, ativan medrol dosepak, MVI, zofran, compazine   Labs: glucose 116   Anthropometrics: Suspect some weight loss related to fluid (lasix 20 mg/day)  Height: 5'6" Weight: 197 lb 4 oz  UBW: 215 lb (per pt) BMI: 31.84   NUTRITION DIAGNOSIS: Unintentional weight loss related to decreased appetite secondary to side effects of chemotherapy as evidenced by 8% (18 lb) decrease from reported usual weight; concerning    INTERVENTION:  Educated on smaller more frequent meals with adequate calories and protein - handout with ideas provided Encouraged bedtime snack  Discussed strategies for increasing calories and protein  Continue bowel regimen for constipation Contact information provided   MONITORING, EVALUATION, GOAL: patient will tolerate increased calories and protein to minimize further weight loss during treatment   Next Visit: To be scheduled as needed

## 2022-03-22 NOTE — Progress Notes (Signed)
Per MD no Velcade today, Dara only

## 2022-03-23 ENCOUNTER — Other Ambulatory Visit (HOSPITAL_COMMUNITY): Payer: Self-pay

## 2022-03-25 ENCOUNTER — Inpatient Hospital Stay: Payer: BC Managed Care – PPO

## 2022-03-25 ENCOUNTER — Other Ambulatory Visit (HOSPITAL_COMMUNITY): Payer: Self-pay

## 2022-03-25 ENCOUNTER — Encounter: Payer: Self-pay | Admitting: Hematology

## 2022-03-26 ENCOUNTER — Other Ambulatory Visit: Payer: Self-pay

## 2022-03-26 DIAGNOSIS — C9000 Multiple myeloma not having achieved remission: Secondary | ICD-10-CM

## 2022-03-26 MED ORDER — REVLIMID 15 MG PO CAPS: 15.0000 mg | ORAL_CAPSULE | Freq: Every day | ORAL | 0 refills | Status: DC

## 2022-03-28 MED FILL — Dexamethasone Sodium Phosphate Inj 100 MG/10ML: INTRAMUSCULAR | Qty: 2 | Status: AC

## 2022-03-29 ENCOUNTER — Encounter: Payer: Self-pay | Admitting: Hematology

## 2022-03-29 ENCOUNTER — Inpatient Hospital Stay: Payer: BC Managed Care – PPO

## 2022-03-29 ENCOUNTER — Other Ambulatory Visit (HOSPITAL_COMMUNITY): Payer: Self-pay

## 2022-03-29 ENCOUNTER — Other Ambulatory Visit: Payer: Self-pay | Admitting: Hematology

## 2022-03-29 ENCOUNTER — Other Ambulatory Visit: Payer: Self-pay

## 2022-03-29 VITALS — BP 132/97 | HR 99 | Temp 98.9°F | Resp 17 | Wt 191.8 lb

## 2022-03-29 DIAGNOSIS — C9000 Multiple myeloma not having achieved remission: Secondary | ICD-10-CM

## 2022-03-29 DIAGNOSIS — G8929 Other chronic pain: Secondary | ICD-10-CM | POA: Diagnosis not present

## 2022-03-29 DIAGNOSIS — Z5112 Encounter for antineoplastic immunotherapy: Secondary | ICD-10-CM | POA: Diagnosis not present

## 2022-03-29 DIAGNOSIS — R0602 Shortness of breath: Secondary | ICD-10-CM | POA: Diagnosis not present

## 2022-03-29 DIAGNOSIS — Z7961 Long term (current) use of immunomodulator: Secondary | ICD-10-CM | POA: Diagnosis not present

## 2022-03-29 DIAGNOSIS — M545 Low back pain, unspecified: Secondary | ICD-10-CM | POA: Diagnosis not present

## 2022-03-29 DIAGNOSIS — R059 Cough, unspecified: Secondary | ICD-10-CM | POA: Diagnosis not present

## 2022-03-29 DIAGNOSIS — R21 Rash and other nonspecific skin eruption: Secondary | ICD-10-CM | POA: Diagnosis not present

## 2022-03-29 DIAGNOSIS — Z7189 Other specified counseling: Secondary | ICD-10-CM

## 2022-03-29 LAB — CBC WITH DIFFERENTIAL (CANCER CENTER ONLY)
Abs Immature Granulocytes: 0.01 10*3/uL (ref 0.00–0.07)
Basophils Absolute: 0 10*3/uL (ref 0.0–0.1)
Basophils Relative: 0 %
Eosinophils Absolute: 0.1 10*3/uL (ref 0.0–0.5)
Eosinophils Relative: 3 %
HCT: 42.4 % (ref 39.0–52.0)
Hemoglobin: 14.2 g/dL (ref 13.0–17.0)
Immature Granulocytes: 0 %
Lymphocytes Relative: 31 %
Lymphs Abs: 1.5 10*3/uL (ref 0.7–4.0)
MCH: 28.2 pg (ref 26.0–34.0)
MCHC: 33.5 g/dL (ref 30.0–36.0)
MCV: 84.3 fL (ref 80.0–100.0)
Monocytes Absolute: 0.5 10*3/uL (ref 0.1–1.0)
Monocytes Relative: 10 %
Neutro Abs: 2.7 10*3/uL (ref 1.7–7.7)
Neutrophils Relative %: 56 %
Platelet Count: 228 10*3/uL (ref 150–400)
RBC: 5.03 MIL/uL (ref 4.22–5.81)
RDW: 15.6 % — ABNORMAL HIGH (ref 11.5–15.5)
WBC Count: 4.8 10*3/uL (ref 4.0–10.5)
nRBC: 0 % (ref 0.0–0.2)

## 2022-03-29 LAB — CMP (CANCER CENTER ONLY)
ALT: 16 U/L (ref 0–44)
AST: 12 U/L — ABNORMAL LOW (ref 15–41)
Albumin: 4.1 g/dL (ref 3.5–5.0)
Alkaline Phosphatase: 177 U/L — ABNORMAL HIGH (ref 38–126)
Anion gap: 8 (ref 5–15)
BUN: 14 mg/dL (ref 8–23)
CO2: 24 mmol/L (ref 22–32)
Calcium: 8.9 mg/dL (ref 8.9–10.3)
Chloride: 104 mmol/L (ref 98–111)
Creatinine: 0.72 mg/dL (ref 0.61–1.24)
GFR, Estimated: >60 mL/min (ref >60)
Glucose, Bld: 96 mg/dL (ref 70–99)
Potassium: 4.2 mmol/L (ref 3.5–5.1)
Sodium: 136 mmol/L (ref 135–145)
Total Bilirubin: 0.5 mg/dL (ref 0.3–1.2)
Total Protein: 7.1 g/dL (ref 6.5–8.1)

## 2022-03-29 MED ORDER — FAMOTIDINE IN NACL 20-0.9 MG/50ML-% IV SOLN
20.0000 mg | Freq: Once | INTRAVENOUS | Status: AC
  Administered 2022-03-29: 20 mg via INTRAVENOUS

## 2022-03-29 MED ORDER — SODIUM CHLORIDE 0.9 % IV SOLN
16.0000 mg/kg | Freq: Once | INTRAVENOUS | Status: AC
  Administered 2022-03-29: 1500 mg via INTRAVENOUS
  Filled 2022-03-29: qty 60

## 2022-03-29 MED ORDER — DRONABINOL 2.5 MG PO CAPS
2.5000 mg | ORAL_CAPSULE | Freq: Two times a day (BID) | ORAL | 0 refills | Status: DC
  Filled 2022-03-29: qty 60, 30d supply, fill #0

## 2022-03-29 MED ORDER — SODIUM CHLORIDE 0.9 % IV SOLN: Freq: Once | INTRAVENOUS | Status: AC

## 2022-03-29 MED ORDER — DIPHENHYDRAMINE HCL 25 MG PO CAPS
50.0000 mg | ORAL_CAPSULE | Freq: Once | ORAL | Status: AC
  Administered 2022-03-29: 50 mg via ORAL

## 2022-03-29 MED ORDER — DRONABINOL 2.5 MG PO CAPS: 2.5000 mg | ORAL_CAPSULE | Freq: Two times a day (BID) | ORAL | 0 refills | Status: DC

## 2022-03-29 MED ORDER — SODIUM CHLORIDE 0.9 % IV SOLN
20.0000 mg | Freq: Once | INTRAVENOUS | Status: AC
  Administered 2022-03-29: 20 mg via INTRAVENOUS
  Filled 2022-03-29: qty 20

## 2022-03-29 MED ORDER — DOXYCYCLINE HYCLATE 100 MG PO TABS: 100.0000 mg | ORAL_TABLET | Freq: Two times a day (BID) | ORAL | 0 refills | Status: AC

## 2022-03-29 MED ORDER — ACETAMINOPHEN 325 MG PO TABS
650.0000 mg | ORAL_TABLET | Freq: Once | ORAL | Status: AC
  Administered 2022-03-29: 650 mg via ORAL

## 2022-03-29 NOTE — Progress Notes (Signed)
Ok to proceed per Dr Irene Limbo with Darzalex today.     V.O. Dr Cleda Clarks, PharmD

## 2022-03-29 NOTE — Addendum Note (Signed)
Addended by: Sullivan Lone on: 03/29/2022 02:43 AM   Modules accepted: Orders

## 2022-03-29 NOTE — Patient Instructions (Signed)
[facility]  Discharge Instructions: Thank you for choosing [facility] to provide your oncology and hematology care.   If you have a lab appointment with the Cancer Center, please go directly to the Cancer Center and check in at the registration area.   Wear comfortable clothing and clothing appropriate for easy access to any Portacath or PICC line.   We strive to give you quality time with your provider. You may need to reschedule your appointment if you arrive late (15 or more minutes).  Arriving late affects you and other patients whose appointments are after yours.  Also, if you miss three or more appointments without notifying the office, you may be dismissed from the clinic at the provider's discretion.      For prescription refill requests, have your pharmacy contact our office and allow 72 hours for refills to be completed.    Today you received the following chemotherapy and/or immunotherapy agents: daratumumab      To help prevent nausea and vomiting after your treatment, we encourage you to take your nausea medication as directed.  BELOW ARE SYMPTOMS THAT SHOULD BE REPORTED IMMEDIATELY: *FEVER GREATER THAN 100.4 F (38 C) OR HIGHER *CHILLS OR SWEATING *NAUSEA AND VOMITING THAT IS NOT CONTROLLED WITH YOUR NAUSEA MEDICATION *UNUSUAL SHORTNESS OF BREATH *UNUSUAL BRUISING OR BLEEDING *URINARY PROBLEMS (pain or burning when urinating, or frequent urination) *BOWEL PROBLEMS (unusual diarrhea, constipation, pain near the anus) TENDERNESS IN MOUTH AND THROAT WITH OR WITHOUT PRESENCE OF ULCERS (sore throat, sores in mouth, or a toothache) UNUSUAL RASH, SWELLING OR PAIN  UNUSUAL VAGINAL DISCHARGE OR ITCHING   Items with * indicate a potential emergency and should be followed up as soon as possible or go to the Emergency Department if any problems should occur.  Please show the CHEMOTHERAPY ALERT CARD or IMMUNOTHERAPY ALERT CARD at check-in  to the Emergency Department and triage nurse.  Should you have questions after your visit or need to cancel or reschedule your appointment, please contact [facility]  Dept: [phone]  and follow the prompts.  Office hours are 8:00 a.m. to 4:30 p.m. Monday - Friday. Please note that voicemails left after 4:00 p.m. may not be returned until the following business day.  We are closed weekends and major holidays. You have access to a nurse at all times for urgent questions. Please call the main number to the clinic Dept: [phone] and follow the prompts.   For any non-urgent questions, you may also contact your provider using MyChart. We now offer e-Visits for anyone 18 and older to request care online for non-urgent symptoms. For details visit [email or web address].   Also download the MyChart app! Go to the app store, search "MyChart", open the app, select [facility], and log in with your MyChart username and password.  Masks are optional in the cancer centers. If you would like for your care team to wear a mask while they are taking care of you, please let them know. You may have one support person who is at least 62 years old accompany you for your appointments. 

## 2022-03-29 NOTE — Progress Notes (Addendum)
HEMATOLOGY/ONCOLOGY CLINIC NOTE  Date of Service: .03/22/2022   Patient Care Team: Lennie Odor, [place] as PCP - General (Nurse Practitioner)  CHIEF COMPLAINTS/PURPOSE OF CONSULTATION:  Follow-up for continued evaluation and management of multiple myeloma  HISTORY OF PRESENTING ILLNESS:   Anthony [name] is a wonderful 61 y.o. male who has been referred to [place] by Dr Lennie Odor, PA for evaluation and management of newly diagnosed multiple myeloma. He reports He is doing well.  He reports persistent lower back pain that he has had since he was in his 20's. He notes no previous significant injuries. He further notes that he gets chiropractic adjustments and takes Tylenol to manage his symptoms. He notes less back pain when standing up on his right leg and maintaining weight on his right leg.  He reports previous history of smoking and chewing tobacco over 40 years ago.  He had a recent fall back in February of this year with only a pulled muscle. And he notes another fall when chasing his cat where he fell on his right shoulder. He reports pain in right shoulder.   He had a recent COVID-19 infection back in March.  He reports intermittent cough with SOB. He notes he takes 2 Rolaids at night. We discussed potentially trying antacids which he is agreeable to as he will begin steroids soon and was advised of the possible symptoms and side effects from taking steroids.  We discussed CRAB criteria and that he meets at least two of the criterion being  anemia and bone disease.  We discussed getting additional scans for further evaluation which he is agreeable to.  We further discussed starting Daratumumab/Velcade/Dexamethasone/Zometa and Revlimid for treatment which he is agreeable to. We also discussed starting steroids before treatment and taking an acid suppressant which he was also agreeable to.  We discussed getting Senna and taking it as needed for constipation.  Labs done today  were reviewed in detail.  We discussed his recent bone marrow biopsy and aspiration done 02/06/2022.  We discussed PET/CT scan done 01/28/2022.  We discussed CT right shoulder w/o contrast done 11/13/2021  INTERVAL HISTORY:  Anthony [name] is a 61 y.o. male was seen and continued valuation and management of multiple myeloma.  He has tolerated his second cycle of daratumumab without any rashes or other reactions and she will be starting his second cycle of Revlimid at this time.  Holding Velcade at this time. His back pain is somewhat better controlled but still quite painful and we discussed increasing his dose of fentanyl. His labs from today were reviewed with him in detail. His MRI of the spine was discussed in detail.  Including presence of pathologic fractures.  He is not keen to be referred to surgery but would be okay with being considered for possible vertebroplasty if he is a candidate.  He is agreeable with being referred to interventional radiology.Marland Kitchen   MEDICAL HISTORY:  Past Medical History:  Diagnosis Date   Back pain    Cancer ([employer])    Multiple myeloma ([place]) 02/15/2022    SURGICAL HISTORY: No past surgical history on file.  SOCIAL HISTORY: Social History   Socioeconomic History   Marital status: Married    Spouse name: Not on file   Number of children: Not on file   Years of education: Not on file   Highest education level: Not on file  Occupational History   Not on file  Tobacco Use   Smoking status: Never  Smokeless tobacco: Former    Types: Chew, Snuff  Vaping Use   Vaping Use: Never used  Substance and Sexual Activity   Alcohol use: Yes    Alcohol/week: 2.0 standard drinks of alcohol    Types: 2 Glasses of wine per week   Drug use: No   Sexual activity: Yes  Other Topics Concern   Not on file  Social History Narrative   Not on file   Social Determinants of Health   Financial Resource Strain: Not on file  Food Insecurity: Not on file   Transportation Needs: Not on file  Physical Activity: Not on file  Stress: Not on file  Social Connections: Not on file  Intimate Partner Violence: Not on file    FAMILY HISTORY: Family History  Problem Relation Age of Onset   Rheum arthritis Mother    Other Sister        Pre-cancerous uterine mass;    Rheum arthritis Sister    Bone cancer Paternal Grandfather    Brain cancer Maternal Uncle    Brain cancer Maternal Aunt    Osteoporosis Neg Hx     ALLERGIES:  has No Known Allergies.  MEDICATIONS:  Current Outpatient Medications  Medication Sig Dispense Refill   fentaNYL (DURAGESIC) 25 MCG/HR Place 1 patch onto the skin every 3 (three) days. 10 patch 0   acyclovir (ZOVIRAX) 400 MG tablet Take 1 tablet (400 mg total) by mouth 2 (two) times daily. 60 tablet 5   aluminum chloride (DRYSOL) 20 % external solution Apply 1 application topically at bedtime.     APIXABAN (ELIQUIS) VTE STARTER PACK (10MG AND 5MG) Take as directed on package: start with two-41m tablets twice daily for 7 days. On day 8, switch to one-579mtablet twice daily. 1 each 0   calcium-vitamin D (OSCAL WITH D) 250-125 MG-UNIT tablet Take 1 tablet by mouth daily.     cyclobenzaprine (FLEXERIL) 10 MG tablet Take 10 mg by mouth 3 (three) times daily as needed for muscle spasms.     dexamethasone (DECADRON) 4 MG tablet Take 5 tabs (20 mg) weekly the day after daratumumab for 12 weeks. Take with breakfast. 20 tablet 5   diclofenac sodium (VOLTAREN) 1 % GEL Apply 2 g topically as needed.     ergocalciferol (VITAMIN D2) 1.25 MG (50000 UT) capsule Take 1 capsule (50,000 Units total) by mouth once a week. 12 capsule 2   esomeprazole (NEXIUM) 40 MG capsule Take 1 capsule (40 mg total) by mouth daily before breakfast. 30 capsule 1   furosemide (LASIX) 20 MG tablet Take 1 tablet (20 mg total) by mouth daily. 30 tablet 1   lidocaine (LIDODERM) 5 % Place 1 patch onto the skin daily. Remove & Discard patch within 12 hours or as  directed by MD 30 patch 0   LORazepam (ATIVAN) 0.5 MG tablet Take 1 tablet (0.5 mg total) by mouth every 6 (six) hours as needed (Nausea or vomiting). 30 tablet 0   methylPREDNISolone (MEDROL DOSEPAK) 4 MG TBPK tablet Take 6 pills by mouth day 1, 5 on day 2, 4 on day 3, 3 on day 4, 2 on day 5, 1 on day 6 21 tablet 0   Multiple Vitamin (MULTIVITAMIN) tablet Take 1 tablet by mouth daily.     Omega-3 Fatty Acids (FISH OIL) 1360 MG CAPS Take 1 capsule by mouth daily.     ondansetron (ZOFRAN) 8 MG tablet Take 1 tablet (8 mg total) by mouth every 8 (eight) hours  as needed for nausea or vomiting. 30 tablet 0   oxyCODONE (OXY IR/ROXICODONE) 5 MG immediate release tablet Take 1-2 tablets (5-10 mg total) by mouth every 4 (four) hours as needed for severe pain or moderate pain (cancer related pain). 60 tablet 0   prochlorperazine (COMPAZINE) 10 MG tablet Take 1 tablet (10 mg total) by mouth every 6 (six) hours as needed (Nausea or vomiting). 30 tablet 1   REVLIMID 15 MG capsule Take 1 capsule (15 mg total) by mouth daily. Take for 14 days on and 7 days off. 14 capsule 0   senna-docusate (SENNA S) 8.6-50 MG tablet Take 2 tablets by mouth at bedtime. 60 tablet 1   terbinafine (LAMISIL) 1 % cream Apply 1 application topically 2 (two) times daily.     No current facility-administered medications for this visit.    REVIEW OF SYSTEMS:   10 Point review of Systems was done is negative except as noted above.  PHYSICAL EXAMINATION: NAD GENERAL:alert, in no acute distress and comfortable SKIN: no acute rashes, no significant lesions EYES: conjunctiva are pink and non-injected, sclera anicteric OROPHARYNX: MMM, no exudates, no oropharyngeal erythema or ulceration NECK: supple, no JVD LYMPH:  no palpable lymphadenopathy in the cervical, axillary or inguinal regions LUNGS: clear to auscultation b/l with normal respiratory effort HEART: regular rate & rhythm ABDOMEN:  normoactive bowel sounds , non tender, not  distended. Extremity: no pedal edema PSYCH: alert & oriented x 3 with fluent speech NEURO: no focal motor/sensory deficits   LABORATORY DATA:  I have reviewed the data as listed .    Latest Ref Rng & Units 03/22/2022    8:29 AM 03/15/2022    8:13 AM 03/08/2022    8:37 AM  CBC  WBC 4.0 - 10.5 K/uL 3.0  2.3  1.2   Hemoglobin 13.0 - 17.0 g/dL 14.0  12.8  12.3   Hematocrit 39.0 - 52.0 % 41.9  37.7  35.9   Platelets 150 - 400 K/uL 215  237  102       Latest Ref Rng & Units 03/22/2022    8:29 AM 03/15/2022    8:13 AM 03/08/2022    8:37 AM  CMP  Glucose 70 - 99 mg/dL 115  100  102   BUN 8 - 23 mg/dL _0 Creatinine 0.61 - 1.24 mg/dL 0.77  0.76  0.83   Sodium 135 - 145 mmol/L 136  137  130   Potassium 3.5 - 5.1 mmol/L 4.2  4.1  3.8   Chloride 98 - 111 mmol/L 104  105  97   CO2 22 - 32 mmol/L _1 Calcium 8.9 - 10.3 mg/dL 8.3  7.8  8.8   Total Protein 6.5 - 8.1 g/dL 7.1  7.0  6.5   Total Bilirubin 0.3 - 1.2 mg/dL 0.4  0.4  0.8   Alkaline Phos 38 - 126 U/L 186  149  92   AST 15 - 41 U/L _2 ALT 0 - 44 U/L _3 02/06/2022 Molecular pathology   RADIOGRAPHIC STUDIES: I have personally reviewed the radiological images as listed and agreed with the findings in the report. MR Lumbar Spine W Wo Contrast  Result Date: 03/15/2022 CLINICAL DATA:  Newly diagnosed multiple myeloma. Severe multilevel back pain. EXAM: MRI THORACIC AND LUMBAR SPINE WITHOUT AND WITH CONTRAST TECHNIQUE: Multiplanar and multiecho pulse sequences  of the thoracic and lumbar spine were obtained without and with intravenous contrast. CONTRAST:  31m GADAVIST GADOBUTROL 1 MMOL/ML IV SOLN COMPARISON:  Abdominopelvic CT 02/23/2022.  PET-CT 01/28/2022. FINDINGS: C-spine findings dictated separately. MRI THORACIC SPINE FINDINGS Alignment:  Physiologic. Vertebrae: As seen on recent PET-CT, widespread myelomatous involvement throughout the vertebral bodies and posterior elements of the thoracic  spine and adjacent ribs. There are innumerable T2 hyperintense enhancing lesions throughout all of the thoracic vertebral segments. There are several mild pathologic fractures, primarily involving the T6, T8, T9 and T12 vertebral bodies. The T12 fracture is associated with approximately 50% loss of vertebral body height anteriorly. No significant osseous retropulsion or epidural tumor identified. Cord: The thoracic cord appears normal in signal and caliber.No abnormal intradural enhancement. Paraspinal and other soft tissues: No significant paraspinal abnormalities. Disc levels: Mild multilevel spondylosis without large disc herniation or significant spinal stenosis. The foramina are sufficiently patent at each level. Next MRI LUMBAR SPINE FINDINGS Segmentation: There are 5 lumbar type vertebral bodies. Alignment:  Physiologic. Vertebrae: Widespread myelomatous involvement throughout the lumbar spine vertebral bodies and posterior elements as well as the visualized upper pelvis. Mild superior endplate compression deformity at L2 with associated mild osseous retropulsion on the left. No other significant osseous retropulsion or epidural tumor identified. Conus medullaris: Extends to the L1 level and appears normal. No abnormal intradural enhancement. Paraspinal and other soft tissues: No significant paraspinal findings. 2.5 cm right adrenal nodule measuring 4 HU on recent CT, consistent with an adenoma. Disc levels: L2-3: Mild disc bulging, facet and ligamentous hypertrophy. The mild left-sided osseous retropulsion contributes to mild narrowing of the left lateral recess, but no definite nerve root encroachment. The foramina appear patent. L3-4: Mild disc bulging and facet hypertrophy. No spinal stenosis or nerve root encroachment. L4-5: Loss of disc height with mild disc bulging and facet hypertrophy. No significant spinal stenosis or nerve root encroachment. L5-S1: Loss of disc height with annular disc bulging and  facet hypertrophy. No significant spinal stenosis or nerve root encroachment. IMPRESSION: 1. Widespread myelomatous lesions throughout the thoracolumbar spine and visualized posterior ribs and upper pelvis. 2. There are several pathologic fractures throughout the mid to lower thoracic spine as well as the L2 vertebral body. The T12 fracture is the most severe. The L2 fracture is associated with mild osseous retropulsion, but no cord compression. No significant epidural tumor. 3. Mild spondylosis without significant spinal stenosis or nerve root encroachment. Electronically Signed   By: WRichardean SaleM.D.   On: 03/15/2022 10:23   MR THORACIC SPINE W WO CONTRAST  Result Date: 03/15/2022 CLINICAL DATA:  Newly diagnosed multiple myeloma. Severe multilevel back pain. EXAM: MRI THORACIC AND LUMBAR SPINE WITHOUT AND WITH CONTRAST TECHNIQUE: Multiplanar and multiecho pulse sequences of the thoracic and lumbar spine were obtained without and with intravenous contrast. CONTRAST:  961mGADAVIST GADOBUTROL 1 MMOL/ML IV SOLN COMPARISON:  Abdominopelvic CT 02/23/2022.  PET-CT 01/28/2022. FINDINGS: C-spine findings dictated separately. MRI THORACIC SPINE FINDINGS Alignment:  Physiologic. Vertebrae: As seen on recent PET-CT, widespread myelomatous involvement throughout the vertebral bodies and posterior elements of the thoracic spine and adjacent ribs. There are innumerable T2 hyperintense enhancing lesions throughout all of the thoracic vertebral segments. There are several mild pathologic fractures, primarily involving the T6, T8, T9 and T12 vertebral bodies. The T12 fracture is associated with approximately 50% loss of vertebral body height anteriorly. No significant osseous retropulsion or epidural tumor identified. Cord: The thoracic cord appears normal in signal and caliber.No abnormal  intradural enhancement. Paraspinal and other soft tissues: No significant paraspinal abnormalities. Disc levels: Mild multilevel  spondylosis without large disc herniation or significant spinal stenosis. The foramina are sufficiently patent at each level. Next MRI LUMBAR SPINE FINDINGS Segmentation: There are 5 lumbar type vertebral bodies. Alignment:  Physiologic. Vertebrae: Widespread myelomatous involvement throughout the lumbar spine vertebral bodies and posterior elements as well as the visualized upper pelvis. Mild superior endplate compression deformity at L2 with associated mild osseous retropulsion on the left. No other significant osseous retropulsion or epidural tumor identified. Conus medullaris: Extends to the L1 level and appears normal. No abnormal intradural enhancement. Paraspinal and other soft tissues: No significant paraspinal findings. 2.5 cm right adrenal nodule measuring 4 HU on recent CT, consistent with an adenoma. Disc levels: L2-3: Mild disc bulging, facet and ligamentous hypertrophy. The mild left-sided osseous retropulsion contributes to mild narrowing of the left lateral recess, but no definite nerve root encroachment. The foramina appear patent. L3-4: Mild disc bulging and facet hypertrophy. No spinal stenosis or nerve root encroachment. L4-5: Loss of disc height with mild disc bulging and facet hypertrophy. No significant spinal stenosis or nerve root encroachment. L5-S1: Loss of disc height with annular disc bulging and facet hypertrophy. No significant spinal stenosis or nerve root encroachment. IMPRESSION: 1. Widespread myelomatous lesions throughout the thoracolumbar spine and visualized posterior ribs and upper pelvis. 2. There are several pathologic fractures throughout the mid to lower thoracic spine as well as the L2 vertebral body. The T12 fracture is the most severe. The L2 fracture is associated with mild osseous retropulsion, but no cord compression. No significant epidural tumor. 3. Mild spondylosis without significant spinal stenosis or nerve root encroachment. Electronically Signed   By: Richardean Sale M.D.   On: 03/15/2022 10:23   MR CERVICAL SPINE W WO CONTRAST  Result Date: 03/15/2022 CLINICAL DATA:  Multiple myeloma, severe multilevel back pain EXAM: MRI CERVICAL SPINE WITHOUT AND WITH CONTRAST TECHNIQUE: Multiplanar and multiecho pulse sequences of the cervical spine, to include the craniocervical junction and cervicothoracic junction, were obtained without and with intravenous contrast. CONTRAST:  8m GADAVIST GADOBUTROL 1 MMOL/ML IV SOLN COMPARISON:  PET-CT 01/28/2022 FINDINGS: Alignment: Physiologic. Vertebrae: No acute fracture or discitis/osteomyelitis. Abnormal marrow lesions throughout the visualized cervical spine and skull base consistent with patient's history of multiple myeloma. Cord: Normal signal and morphology. Posterior Fossa, vertebral arteries, paraspinal tissues: Posterior fossa demonstrates no focal abnormality. Vertebral artery flow voids are maintained. Paraspinal soft tissues are unremarkable. Disc levels: Discs: Degenerative disease with disc height loss at C3-4, C5-6, C6-7 and C7-T1. C2-3: No disc protrusion. Severe right and mild left facet arthropathy. Severe right foraminal stenosis. No left foraminal stenosis. No spinal stenosis. C3-4: No disc protrusion. Bilateral uncovertebral degenerative changes. Moderate bilateral foraminal stenosis, left greater than right. No spinal stenosis. C4-5: Mild broad-based disc bulge. Bilateral uncovertebral degenerative changes. Moderate bilateral foraminal stenosis. No spinal stenosis. C5-6: Mild broad-based disc bulge. Bilateral uncovertebral degenerative changes. Severe bilateral foraminal stenosis. Mild spinal stenosis. C6-7: Mild broad-based disc bulge. Bilateral uncovertebral degenerative changes. Mild bilateral foraminal stenosis. No spinal stenosis. C7-T1: No disc protrusion. Moderate left foraminal stenosis. Mild right foraminal stenosis. No spinal stenosis. IMPRESSION: 1. Diffuse abnormal marrow lesions consistent with  patient's history of multiple myeloma. 2. Diffuse cervical spine spondylosis as described above. 3. No acute osseous injury of the cervical spine. Electronically Signed   By: HKathreen DevoidM.D.   On: 03/15/2022 09:52   VAS [place] EXTREMITY VEN[place] (DVT)  Result Date: 03/12/2022  Lower Venous DVT Study Patient Name:  Anthony [name]  Date of Exam:   03/12/2022 Medical Rec #: [id number]        Accession #:    [id number] Date of Birth: 1961/03/12        Patient Gender: M Patient Age:   34 years Exam Location:  [facility] Procedure:      VAS [place] LOWER EXTREMITY VENOUS (DVT) Referring Phys: Sullivan Lone --------------------------------------------------------------------------------  Indications: Swelling.  Risk Factors: Cancer. Limitations: Poor ultrasound/tissue interface. Comparison Study: No prior studies. Performing Technologist: Oliver Hum RVT  Examination Guidelines: A complete evaluation includes B-mode imaging, spectral Doppler, color Doppler, and power Doppler as needed of all accessible portions of each vessel. Bilateral testing is considered an integral part of a complete examination. Limited examinations for reoccurring indications may be performed as noted. The reflux portion of the exam is performed with the patient in reverse Trendelenburg.  +---------+---------------+---------+-----------+----------+--------------+ RIGHT    CompressibilityPhasicitySpontaneityPropertiesThrombus Aging +---------+---------------+---------+-----------+----------+--------------+ CFV      Full           Yes      Yes                                 +---------+---------------+---------+-----------+----------+--------------+ SFJ      Full                                                        +---------+---------------+---------+-----------+----------+--------------+ FV Prox  Full                                                         +---------+---------------+---------+-----------+----------+--------------+ FV Mid   Full                                                        +---------+---------------+---------+-----------+----------+--------------+ FV DistalFull                                                        +---------+---------------+---------+-----------+----------+--------------+ PFV      Full                                                        +---------+---------------+---------+-----------+----------+--------------+ POP      Full           Yes      Yes                                 +---------+---------------+---------+-----------+----------+--------------+ PTV      Partial  Acute          +---------+---------------+---------+-----------+----------+--------------+ PERO     Full                                                        +---------+---------------+---------+-----------+----------+--------------+   +---------+---------------+---------+-----------+----------+--------------+ LEFT     CompressibilityPhasicitySpontaneityPropertiesThrombus Aging +---------+---------------+---------+-----------+----------+--------------+ CFV      Full           Yes      Yes                                 +---------+---------------+---------+-----------+----------+--------------+ SFJ      Full                                                        +---------+---------------+---------+-----------+----------+--------------+ FV Prox  Full                                                        +---------+---------------+---------+-----------+----------+--------------+ FV Mid   Full                                                        +---------+---------------+---------+-----------+----------+--------------+ FV DistalFull                                                         +---------+---------------+---------+-----------+----------+--------------+ PFV      Full                                                        +---------+---------------+---------+-----------+----------+--------------+ POP      Full           Yes      Yes                                 +---------+---------------+---------+-----------+----------+--------------+ PTV      Full                                                        +---------+---------------+---------+-----------+----------+--------------+ PERO     Full                                                        +---------+---------------+---------+-----------+----------+--------------+  Summary: RIGHT: - Findings consistent with acute deep vein thrombosis involving the right posterior tibial veins. - No cystic structure found in the popliteal fossa.  LEFT: - There is no evidence of deep vein thrombosis in the lower extremity.  - No cystic structure found in the popliteal fossa.  *See table(s) above for measurements and observations. Electronically signed by Deitra Mayo MD on 03/12/2022 at 2:22:06 PM.    Final      ASSESSMENT & PLAN:   61 y.o. very pleasant male with  1. Newly diagnosed multiple myeloma with extensive bone metastases and patholgiic fracture rt shoulder -Recent bone marrow biopsy and aspiration done 02/06/2022 revealed hypercellular bone marrow with plasma cell neoplasm.  Plan -Patient is here for cycle 2-day 8 of myeloma treatment today.  He is tolerating his daratumumab without any issues with recurrent rashes at this time. -His Velcade has been held at this time since this is likely the cause of his rashes. -He should be starting his Revlimid at this time and we shall reassess in the next week if he is developing any rashes from this.  If he is tolerating his Revlimid then we would have to assume that the Velcade was the cause of his significant rashes and likely switch this to  carfilzomib from the neck cycle. -His MRI of the spine was discussed in details-he has multiple pathologic fractures in his spine as part of his extensive myelomatous involvement.  The T12 fracture is most significant. -Patient is not keen on a neurosurgical referral at this time but would be open to being evaluated by interventional radiology for consideration of possible vertebroplasty of T12 to help with pain management. -He was recommended against significant jumping twisting climbing or other stress to the spine which might worsen or cause more pathologic fractures at this time. -continue to optimize myeloma treatment. We shall increase his fentanyl patch to 25 mcg/h and will continue as needed oxycodone for pain control. -Continue optimal bowel regimen as discussed. -Continue monthly Zometa.  Follow up: Follow-up as scheduled for cycle 2-day 15.  Of daratumumab with labs.  Will decide on cycle 3 treatment based on tolerance.  The total time spent in the appointment was 25 minutes*.  All of the patient's questions were answered with apparent satisfaction. The patient knows to call the clinic with any problems, questions or concerns.   Sullivan Lone MD MS AAHIVMS [facility] Hematology/Oncology Physician [facility]  .*Total Encounter Time as defined by the Centers for Medicare and Medicaid Services includes, in addition to the face-to-face time of a patient visit (documented in the note above) non-face-to-face time: obtaining and reviewing outside history, ordering and reviewing medications, tests or procedures, care coordination (communications with other health care professionals or caregivers) and documentation in the medical record.

## 2022-04-01 ENCOUNTER — Other Ambulatory Visit: Payer: Self-pay | Admitting: Hematology

## 2022-04-01 ENCOUNTER — Other Ambulatory Visit: Payer: Self-pay

## 2022-04-01 ENCOUNTER — Ambulatory Visit: Payer: BC Managed Care – PPO

## 2022-04-01 DIAGNOSIS — Z7189 Other specified counseling: Secondary | ICD-10-CM

## 2022-04-01 DIAGNOSIS — C9000 Multiple myeloma not having achieved remission: Secondary | ICD-10-CM

## 2022-04-01 LAB — KAPPA/LAMBDA LIGHT CHAINS
Kappa free light chain: 48.3 mg/L — ABNORMAL HIGH (ref 3.3–19.4)
Kappa, lambda light chain ratio: 3.77 — ABNORMAL HIGH (ref 0.26–1.65)
Lambda free light chains: 12.8 mg/L (ref 5.7–26.3)

## 2022-04-03 ENCOUNTER — Other Ambulatory Visit: Payer: Self-pay | Admitting: Hematology

## 2022-04-03 ENCOUNTER — Other Ambulatory Visit: Payer: Self-pay

## 2022-04-03 DIAGNOSIS — C9000 Multiple myeloma not having achieved remission: Secondary | ICD-10-CM

## 2022-04-03 LAB — MULTIPLE MYELOMA PANEL, SERUM
Albumin SerPl Elph-Mcnc: 3.5 g/dL (ref 2.9–4.4)
Albumin/Glob SerPl: 1.2 (ref 0.7–1.7)
Alpha 1: 0.3 g/dL (ref 0.0–0.4)
Alpha2 Glob SerPl Elph-Mcnc: 0.7 g/dL (ref 0.4–1.0)
B-Globulin SerPl Elph-Mcnc: 1.5 g/dL — ABNORMAL HIGH (ref 0.7–1.3)
Gamma Glob SerPl Elph-Mcnc: 0.6 g/dL (ref 0.4–1.8)
Globulin, Total: 3.1 g/dL (ref 2.2–3.9)
IgA: 1153 mg/dL — ABNORMAL HIGH (ref 61–437)
IgG (Immunoglobin G), Serum: 434 mg/dL — ABNORMAL LOW (ref 603–1613)
IgM (Immunoglobulin M), Srm: 18 mg/dL — ABNORMAL LOW (ref 20–172)
M Protein SerPl Elph-Mcnc: 0.7 g/dL — ABNORMAL HIGH
Total Protein ELP: 6.6 g/dL (ref 6.0–8.5)

## 2022-04-03 MED ORDER — ERGOCALCIFEROL 1.25 MG (50000 UT) PO CAPS: 50000.0000 [IU] | ORAL_CAPSULE | Freq: Two times a day (BID) | ORAL | 2 refills | Status: DC

## 2022-04-05 ENCOUNTER — Other Ambulatory Visit: Payer: Self-pay | Admitting: Hematology

## 2022-04-05 ENCOUNTER — Other Ambulatory Visit: Payer: Self-pay

## 2022-04-05 DIAGNOSIS — C9000 Multiple myeloma not having achieved remission: Secondary | ICD-10-CM

## 2022-04-05 MED FILL — Dexamethasone Sodium Phosphate Inj 100 MG/10ML: INTRAMUSCULAR | Qty: 2 | Status: AC

## 2022-04-08 ENCOUNTER — Inpatient Hospital Stay (HOSPITAL_BASED_OUTPATIENT_CLINIC_OR_DEPARTMENT_OTHER): Payer: BC Managed Care – PPO

## 2022-04-08 ENCOUNTER — Other Ambulatory Visit: Payer: Self-pay

## 2022-04-08 ENCOUNTER — Inpatient Hospital Stay: Payer: BC Managed Care – PPO

## 2022-04-08 VITALS — BP 135/85 | HR 89 | Temp 98.6°F | Resp 17 | Ht 67.0 in | Wt 195.8 lb

## 2022-04-08 DIAGNOSIS — Z7189 Other specified counseling: Secondary | ICD-10-CM | POA: Diagnosis not present

## 2022-04-08 DIAGNOSIS — G8929 Other chronic pain: Secondary | ICD-10-CM | POA: Diagnosis not present

## 2022-04-08 DIAGNOSIS — R0602 Shortness of breath: Secondary | ICD-10-CM | POA: Diagnosis not present

## 2022-04-08 DIAGNOSIS — C9000 Multiple myeloma not having achieved remission: Secondary | ICD-10-CM | POA: Diagnosis not present

## 2022-04-08 DIAGNOSIS — Z7961 Long term (current) use of immunomodulator: Secondary | ICD-10-CM | POA: Diagnosis not present

## 2022-04-08 DIAGNOSIS — Z5112 Encounter for antineoplastic immunotherapy: Secondary | ICD-10-CM | POA: Diagnosis not present

## 2022-04-08 DIAGNOSIS — R059 Cough, unspecified: Secondary | ICD-10-CM | POA: Diagnosis not present

## 2022-04-08 DIAGNOSIS — M545 Low back pain, unspecified: Secondary | ICD-10-CM | POA: Diagnosis not present

## 2022-04-08 DIAGNOSIS — R21 Rash and other nonspecific skin eruption: Secondary | ICD-10-CM | POA: Diagnosis not present

## 2022-04-08 LAB — CMP (CANCER CENTER ONLY)
ALT: 21 U/L (ref 0–44)
AST: 17 U/L (ref 15–41)
Albumin: 3.6 g/dL (ref 3.5–5.0)
Alkaline Phosphatase: 104 U/L (ref 38–126)
Anion gap: 7 (ref 5–15)
BUN: 14 mg/dL (ref 8–23)
CO2: 23 mmol/L (ref 22–32)
Calcium: 8.4 mg/dL — ABNORMAL LOW (ref 8.9–10.3)
Chloride: 105 mmol/L (ref 98–111)
Creatinine: 0.81 mg/dL (ref 0.61–1.24)
GFR, Estimated: >60 mL/min (ref >60)
Glucose, Bld: 106 mg/dL — ABNORMAL HIGH (ref 70–99)
Potassium: 3.9 mmol/L (ref 3.5–5.1)
Sodium: 135 mmol/L (ref 135–145)
Total Bilirubin: 0.5 mg/dL (ref 0.3–1.2)
Total Protein: 6.3 g/dL — ABNORMAL LOW (ref 6.5–8.1)

## 2022-04-08 LAB — CBC WITH DIFFERENTIAL (CANCER CENTER ONLY)
Abs Immature Granulocytes: 0.01 10*3/uL (ref 0.00–0.07)
Basophils Absolute: 0 10*3/uL (ref 0.0–0.1)
Basophils Relative: 0 %
Eosinophils Absolute: 0.4 10*3/uL (ref 0.0–0.5)
Eosinophils Relative: 9 %
HCT: 39.4 % (ref 39.0–52.0)
Hemoglobin: 13.2 g/dL (ref 13.0–17.0)
Immature Granulocytes: 0 %
Lymphocytes Relative: 23 %
Lymphs Abs: 1 10*3/uL (ref 0.7–4.0)
MCH: 28 pg (ref 26.0–34.0)
MCHC: 33.5 g/dL (ref 30.0–36.0)
MCV: 83.7 fL (ref 80.0–100.0)
Monocytes Absolute: 0.5 10*3/uL (ref 0.1–1.0)
Monocytes Relative: 11 %
Neutro Abs: 2.4 10*3/uL (ref 1.7–7.7)
Neutrophils Relative %: 57 %
Platelet Count: 188 10*3/uL (ref 150–400)
RBC: 4.71 MIL/uL (ref 4.22–5.81)
RDW: 15.1 % (ref 11.5–15.5)
WBC Count: 4.2 10*3/uL (ref 4.0–10.5)
nRBC: 0 % (ref 0.0–0.2)

## 2022-04-08 MED ORDER — DIPHENHYDRAMINE HCL 25 MG PO CAPS
50.0000 mg | ORAL_CAPSULE | Freq: Once | ORAL | Status: AC
  Administered 2022-04-08: 50 mg via ORAL
  Filled 2022-04-08: qty 2

## 2022-04-08 MED ORDER — SODIUM CHLORIDE 0.9 % IV SOLN
16.0000 mg/kg | Freq: Once | INTRAVENOUS | Status: AC
  Administered 2022-04-08: 1400 mg via INTRAVENOUS
  Filled 2022-04-08: qty 60

## 2022-04-08 MED ORDER — MONTELUKAST SODIUM 10 MG PO TABS
10.0000 mg | ORAL_TABLET | Freq: Once | ORAL | Status: AC
  Administered 2022-04-08: 10 mg via ORAL
  Filled 2022-04-08: qty 1

## 2022-04-08 MED ORDER — SODIUM CHLORIDE 0.9 % IV SOLN: Freq: Once | INTRAVENOUS | Status: AC

## 2022-04-08 MED ORDER — DEXTROSE 5 % IV SOLN
20.0000 mg/m2 | Freq: Once | INTRAVENOUS | Status: AC
  Administered 2022-04-08: 40 mg via INTRAVENOUS
  Filled 2022-04-08: qty 5

## 2022-04-08 MED ORDER — SODIUM CHLORIDE 0.9 % IV SOLN
20.0000 mg | Freq: Once | INTRAVENOUS | Status: AC
  Administered 2022-04-08: 20 mg via INTRAVENOUS
  Filled 2022-04-08: qty 20

## 2022-04-08 MED ORDER — ZOLEDRONIC ACID 4 MG/100ML IV SOLN
4.0000 mg | Freq: Once | INTRAVENOUS | Status: AC
  Administered 2022-04-08: 4 mg via INTRAVENOUS
  Filled 2022-04-08: qty 100

## 2022-04-08 MED ORDER — ACETAMINOPHEN 325 MG PO TABS
650.0000 mg | ORAL_TABLET | Freq: Once | ORAL | Status: AC
  Administered 2022-04-08: 650 mg via ORAL
  Filled 2022-04-08: qty 2

## 2022-04-08 MED ORDER — FAMOTIDINE IN NACL 20-0.9 MG/50ML-% IV SOLN
20.0000 mg | Freq: Once | INTRAVENOUS | Status: AC
  Administered 2022-04-08: 20 mg via INTRAVENOUS
  Filled 2022-04-08: qty 50

## 2022-04-08 NOTE — Patient Instructions (Signed)
[facility] MEDICAL ONCOLOGY  Discharge Instructions: Thank you for choosing [facility] to provide your oncology and hematology care.   If you have a lab appointment with the Cancer Center, please go directly to the Cancer Center and check in at the registration area.   Wear comfortable clothing and clothing appropriate for easy access to any Portacath or PICC line.   We strive to give you quality time with your provider. You may need to reschedule your appointment if you arrive late (15 or more minutes).  Arriving late affects you and other patients whose appointments are after yours.  Also, if you miss three or more appointments without notifying the office, you may be dismissed from the clinic at the provider's discretion.      For prescription refill requests, have your pharmacy contact our office and allow 72 hours for refills to be completed.    Today you received the following chemotherapy and/or immunotherapy agents: carfilzomib and daratumumab      To help prevent nausea and vomiting after your treatment, we encourage you to take your nausea medication as directed.  BELOW ARE SYMPTOMS THAT SHOULD BE REPORTED IMMEDIATELY: *FEVER GREATER THAN 100.4 F (38 C) OR HIGHER *CHILLS OR SWEATING *NAUSEA AND VOMITING THAT IS NOT CONTROLLED WITH YOUR NAUSEA MEDICATION *UNUSUAL SHORTNESS OF BREATH *UNUSUAL BRUISING OR BLEEDING *URINARY PROBLEMS (pain or burning when urinating, or frequent urination) *BOWEL PROBLEMS (unusual diarrhea, constipation, pain near the anus) TENDERNESS IN MOUTH AND THROAT WITH OR WITHOUT PRESENCE OF ULCERS (sore throat, sores in mouth, or a toothache) UNUSUAL RASH, SWELLING OR PAIN  UNUSUAL VAGINAL DISCHARGE OR ITCHING   Items with * indicate a potential emergency and should be followed up as soon as possible or go to the Emergency Department if any problems should occur.  Please show the CHEMOTHERAPY ALERT CARD or IMMUNOTHERAPY ALERT  CARD at check-in to the Emergency Department and triage nurse.  Should you have questions after your visit or need to cancel or reschedule your appointment, please contact [facility] MEDICAL [facility]COL[facility]  Dept: [phone]  and follow the prompts.  Office hours are 8:00 a.m. to 4:30 p.m. Monday - Friday. Please note that voicemails left after 4:00 p.m. may not be returned until the following business day.  We are closed weekends and major holidays. You have access to a nurse at all times for urgent questions. Please call the main number to the clinic Dept: [phone] and follow the prompts.   For any non-urgent questions, you may also contact your provider using MyChart. We now offer e-Visits for anyone 18 and older to request care online for non-urgent symptoms. For details visit [email or web address].   Also download the MyChart app! Go to the app store, search "MyChart", open the app, select [facility], and log in with your MyChart username and password.  Masks are optional in the cancer centers. If you would like for your care team to wear a mask while they are taking care of you, please let them know. You may have one support person who is at least 62 years old accompany you for your appointments. 

## 2022-04-08 NOTE — Progress Notes (Signed)
Okay to proceed with zometa today with corrected calcium 8.72 per Dr Irene Limbo.

## 2022-04-09 ENCOUNTER — Other Ambulatory Visit: Payer: Self-pay | Admitting: Hematology

## 2022-04-09 ENCOUNTER — Other Ambulatory Visit: Payer: Self-pay

## 2022-04-09 ENCOUNTER — Encounter: Payer: Self-pay | Admitting: Hematology

## 2022-04-10 ENCOUNTER — Encounter: Payer: Self-pay | Admitting: Hematology

## 2022-04-10 ENCOUNTER — Other Ambulatory Visit: Payer: Self-pay | Admitting: Hematology

## 2022-04-10 DIAGNOSIS — C9000 Multiple myeloma not having achieved remission: Secondary | ICD-10-CM

## 2022-04-10 MED ORDER — APIXABAN 5 MG PO TABS: 5.0000 mg | ORAL_TABLET | Freq: Two times a day (BID) | ORAL | 5 refills | Status: DC

## 2022-04-15 ENCOUNTER — Inpatient Hospital Stay (HOSPITAL_BASED_OUTPATIENT_CLINIC_OR_DEPARTMENT_OTHER): Payer: BC Managed Care – PPO | Admitting: Hematology

## 2022-04-15 ENCOUNTER — Inpatient Hospital Stay: Payer: BC Managed Care – PPO

## 2022-04-15 ENCOUNTER — Other Ambulatory Visit: Payer: Self-pay | Admitting: Hematology

## 2022-04-15 ENCOUNTER — Other Ambulatory Visit: Payer: Self-pay

## 2022-04-15 DIAGNOSIS — C9000 Multiple myeloma not having achieved remission: Secondary | ICD-10-CM

## 2022-04-15 DIAGNOSIS — G8929 Other chronic pain: Secondary | ICD-10-CM | POA: Diagnosis not present

## 2022-04-15 DIAGNOSIS — Z7189 Other specified counseling: Secondary | ICD-10-CM | POA: Diagnosis not present

## 2022-04-15 DIAGNOSIS — Z5112 Encounter for antineoplastic immunotherapy: Secondary | ICD-10-CM | POA: Diagnosis not present

## 2022-04-15 DIAGNOSIS — M545 Low back pain, unspecified: Secondary | ICD-10-CM | POA: Diagnosis not present

## 2022-04-15 DIAGNOSIS — Z7961 Long term (current) use of immunomodulator: Secondary | ICD-10-CM | POA: Diagnosis not present

## 2022-04-15 DIAGNOSIS — R0602 Shortness of breath: Secondary | ICD-10-CM | POA: Diagnosis not present

## 2022-04-15 DIAGNOSIS — R21 Rash and other nonspecific skin eruption: Secondary | ICD-10-CM | POA: Diagnosis not present

## 2022-04-15 DIAGNOSIS — R059 Cough, unspecified: Secondary | ICD-10-CM | POA: Diagnosis not present

## 2022-04-15 LAB — CBC WITH DIFFERENTIAL (CANCER CENTER ONLY)
Abs Immature Granulocytes: 0 10*3/uL (ref 0.00–0.07)
Basophils Absolute: 0 10*3/uL (ref 0.0–0.1)
Basophils Relative: 1 %
Eosinophils Absolute: 0.3 10*3/uL (ref 0.0–0.5)
Eosinophils Relative: 7 %
HCT: 40.9 % (ref 39.0–52.0)
Hemoglobin: 13.9 g/dL (ref 13.0–17.0)
Immature Granulocytes: 0 %
Lymphocytes Relative: 44 %
Lymphs Abs: 1.9 10*3/uL (ref 0.7–4.0)
MCH: 28.3 pg (ref 26.0–34.0)
MCHC: 34 g/dL (ref 30.0–36.0)
MCV: 83.1 fL (ref 80.0–100.0)
Monocytes Absolute: 0.7 10*3/uL (ref 0.1–1.0)
Monocytes Relative: 16 %
Neutro Abs: 1.4 10*3/uL — ABNORMAL LOW (ref 1.7–7.7)
Neutrophils Relative %: 32 %
Platelet Count: 159 10*3/uL (ref 150–400)
RBC: 4.92 MIL/uL (ref 4.22–5.81)
RDW: 14.9 % (ref 11.5–15.5)
WBC Count: 4.3 10*3/uL (ref 4.0–10.5)
nRBC: 0 % (ref 0.0–0.2)

## 2022-04-15 LAB — CMP (CANCER CENTER ONLY)
ALT: 22 U/L (ref 0–44)
AST: 13 U/L — ABNORMAL LOW (ref 15–41)
Albumin: 3.9 g/dL (ref 3.5–5.0)
Alkaline Phosphatase: 97 U/L (ref 38–126)
Anion gap: 4 — ABNORMAL LOW (ref 5–15)
BUN: 12 mg/dL (ref 8–23)
CO2: 26 mmol/L (ref 22–32)
Calcium: 9.1 mg/dL (ref 8.9–10.3)
Chloride: 104 mmol/L (ref 98–111)
Creatinine: 0.59 mg/dL — ABNORMAL LOW (ref 0.61–1.24)
GFR, Estimated: >60 mL/min (ref >60)
Glucose, Bld: 93 mg/dL (ref 70–99)
Potassium: 4.1 mmol/L (ref 3.5–5.1)
Sodium: 134 mmol/L — ABNORMAL LOW (ref 135–145)
Total Bilirubin: 0.5 mg/dL (ref 0.3–1.2)
Total Protein: 6.4 g/dL — ABNORMAL LOW (ref 6.5–8.1)

## 2022-04-15 LAB — MULTIPLE MYELOMA PANEL, SERUM
Albumin SerPl Elph-Mcnc: 3.3 g/dL (ref 2.9–4.4)
Albumin/Glob SerPl: 1.3 (ref 0.7–1.7)
Alpha 1: 0.2 g/dL (ref 0.0–0.4)
Alpha2 Glob SerPl Elph-Mcnc: 0.6 g/dL (ref 0.4–1.0)
B-Globulin SerPl Elph-Mcnc: 1.4 g/dL — ABNORMAL HIGH (ref 0.7–1.3)
Gamma Glob SerPl Elph-Mcnc: 0.4 g/dL (ref 0.4–1.8)
Globulin, Total: 2.6 g/dL (ref 2.2–3.9)
IgA: 631 mg/dL — ABNORMAL HIGH (ref 61–437)
IgG (Immunoglobin G), Serum: 469 mg/dL — ABNORMAL LOW (ref 603–1613)
IgM (Immunoglobulin M), Srm: 20 mg/dL (ref 20–172)
M Protein SerPl Elph-Mcnc: 0.6 g/dL — ABNORMAL HIGH
Total Protein ELP: 5.9 g/dL — ABNORMAL LOW (ref 6.0–8.5)

## 2022-04-15 MED FILL — Dexamethasone Sodium Phosphate Inj 100 MG/10ML: INTRAMUSCULAR | Qty: 2 | Status: AC

## 2022-04-16 ENCOUNTER — Inpatient Hospital Stay: Payer: BC Managed Care – PPO

## 2022-04-16 ENCOUNTER — Other Ambulatory Visit: Payer: BC Managed Care – PPO

## 2022-04-16 VITALS — BP 127/78 | HR 90 | Temp 97.8°F | Resp 17

## 2022-04-16 DIAGNOSIS — R059 Cough, unspecified: Secondary | ICD-10-CM | POA: Diagnosis not present

## 2022-04-16 DIAGNOSIS — R21 Rash and other nonspecific skin eruption: Secondary | ICD-10-CM | POA: Diagnosis not present

## 2022-04-16 DIAGNOSIS — Z7189 Other specified counseling: Secondary | ICD-10-CM

## 2022-04-16 DIAGNOSIS — C9000 Multiple myeloma not having achieved remission: Secondary | ICD-10-CM | POA: Diagnosis not present

## 2022-04-16 DIAGNOSIS — M545 Low back pain, unspecified: Secondary | ICD-10-CM | POA: Diagnosis not present

## 2022-04-16 DIAGNOSIS — G8929 Other chronic pain: Secondary | ICD-10-CM | POA: Diagnosis not present

## 2022-04-16 DIAGNOSIS — Z7961 Long term (current) use of immunomodulator: Secondary | ICD-10-CM | POA: Diagnosis not present

## 2022-04-16 DIAGNOSIS — Z5112 Encounter for antineoplastic immunotherapy: Secondary | ICD-10-CM | POA: Diagnosis not present

## 2022-04-16 DIAGNOSIS — R0602 Shortness of breath: Secondary | ICD-10-CM | POA: Diagnosis not present

## 2022-04-16 MED ORDER — DIPHENHYDRAMINE HCL 25 MG PO CAPS
50.0000 mg | ORAL_CAPSULE | Freq: Once | ORAL | Status: AC
  Administered 2022-04-16: 50 mg via ORAL
  Filled 2022-04-16: qty 2

## 2022-04-16 MED ORDER — SODIUM CHLORIDE 0.9 % IV SOLN: Freq: Once | INTRAVENOUS | Status: AC

## 2022-04-16 MED ORDER — SODIUM CHLORIDE 0.9 % IV SOLN
16.0000 mg/kg | Freq: Once | INTRAVENOUS | Status: AC
  Administered 2022-04-16: 1400 mg via INTRAVENOUS
  Filled 2022-04-16: qty 60

## 2022-04-16 MED ORDER — FAMOTIDINE IN NACL 20-0.9 MG/50ML-% IV SOLN
20.0000 mg | Freq: Once | INTRAVENOUS | Status: AC
  Administered 2022-04-16: 20 mg via INTRAVENOUS
  Filled 2022-04-16: qty 50

## 2022-04-16 MED ORDER — SODIUM CHLORIDE 0.9 % IV SOLN
20.0000 mg | Freq: Once | INTRAVENOUS | Status: AC
  Administered 2022-04-16: 20 mg via INTRAVENOUS
  Filled 2022-04-16: qty 20

## 2022-04-16 MED ORDER — DEXTROSE 5 % IV SOLN
70.0000 mg/m2 | Freq: Once | INTRAVENOUS | Status: AC
  Administered 2022-04-16: 140 mg via INTRAVENOUS
  Filled 2022-04-16: qty 60

## 2022-04-16 MED ORDER — ACETAMINOPHEN 325 MG PO TABS
650.0000 mg | ORAL_TABLET | Freq: Once | ORAL | Status: AC
  Administered 2022-04-16: 650 mg via ORAL
  Filled 2022-04-16: qty 2

## 2022-04-16 MED ORDER — MONTELUKAST SODIUM 10 MG PO TABS
10.0000 mg | ORAL_TABLET | Freq: Once | ORAL | Status: AC
  Administered 2022-04-16: 10 mg via ORAL
  Filled 2022-04-16: qty 1

## 2022-04-16 NOTE — Patient Instructions (Signed)
[facility] ONCOLOGY  Discharge Instructions: Thank you for choosing [facility] to provide your oncology and hematology care.   If you have a lab appointment with the [facility], please go directly to the [facility] and check in at the registration area.   Wear comfortable clothing and clothing appropriate for easy access to any Portacath or PICC line.   We strive to give you quality time with your provider. You may need to reschedule your appointment if you arrive late (15 or more minutes).  Arriving late affects you and other patients whose appointments are after yours.  Also, if you miss three or more appointments without notifying the office, you may be dismissed from the clinic at the provider's discretion.      For prescription refill requests, have your pharmacy contact our office and allow 72 hours for refills to be completed.    Today you received the following chemotherapy and/or immunotherapy agents: daratumumab, kyprolis     To help prevent nausea and vomiting after your treatment, we encourage you to take your nausea medication as directed.  BELOW ARE SYMPTOMS THAT SHOULD BE REPORTED IMMEDIATELY: *FEVER GREATER THAN 100.4 F (38 C) OR HIGHER *CHILLS OR SWEATING *NAUSEA AND VOMITING THAT IS NOT CONTROLLED WITH YOUR NAUSEA MEDICATION *UNUSUAL SHORTNESS OF BREATH *UNUSUAL BRUISING OR BLEEDING *URINARY PROBLEMS (pain or burning when urinating, or frequent urination) *BOWEL PROBLEMS (unusual diarrhea, constipation, pain near the anus) TENDERNESS IN MOUTH AND THROAT WITH OR WITHOUT PRESENCE OF ULCERS (sore throat, sores in mouth, or a toothache) UNUSUAL RASH, SWELLING OR PAIN  UNUSUAL VAGINAL DISCHARGE OR ITCHING   Items with * indicate a potential emergency and should be followed up as soon as possible or go to the Emergency Department if any problems should occur.  Please show the CHEMOTHERAPY ALERT CARD or IMMUNOTHERAPY ALERT CARD at  check-in to the Emergency Department and triage nurse.  Should you have questions after your visit or need to cancel or reschedule your appointment, please contact [facility]  Dept: [phone]  and follow the prompts.  Office hours are 8:00 a.m. to 4:30 p.m. Monday - Friday. Please note that voicemails left after 4:00 p.m. may not be returned until the following business day.  We are closed weekends and major holidays. You have access to a nurse at all times for urgent questions. Please call the main number to the clinic Dept: [phone] and follow the prompts.   For any non-urgent questions, you may also contact your provider using MyChart. We now offer e-Visits for anyone 50 and older to request care online for non-urgent symptoms. For details visit [email or web address].   Also download the MyChart app! Go to the app store, search "MyChart", open the app, select , and log in with your MyChart username and password.  Masks are optional in the cancer centers. If you would like for your care team to wear a mask while they are taking care of you, please let them know. You may have one support person who is at least 62 years old accompany you for your appointments.

## 2022-04-20 ENCOUNTER — Other Ambulatory Visit: Payer: Self-pay | Admitting: Hematology

## 2022-04-20 DIAGNOSIS — C9000 Multiple myeloma not having achieved remission: Secondary | ICD-10-CM

## 2022-04-22 ENCOUNTER — Encounter: Payer: Self-pay | Admitting: Hematology

## 2022-04-22 NOTE — Progress Notes (Addendum)
HEMATOLOGY/ONCOLOGY CLINIC NOTE  Date of Service: .04/15/2022   Patient Care Team: Lennie Odor, PA as PCP - General (Nurse Practitioner)  CHIEF COMPLAINTS/PURPOSE OF CONSULTATION:  Follow-up  HISTORY OF PRESENTING ILLNESS:   Anthony [name] is a wonderful 61 y.o. male who has been referred to [place] by Dr Lennie Odor, PA for evaluation and management of newly diagnosed multiple myeloma. He reports He is doing well.  He reports persistent lower back pain that he has had since he was in his 20's. He notes no previous significant injuries. He further notes that he gets chiropractic adjustments and takes Tylenol to manage his symptoms. He notes less back pain when standing up on his right leg and maintaining weight on his right leg.  He reports previous history of smoking and chewing tobacco over 40 years ago.  He had a recent fall back in February of this year with only a pulled muscle. And he notes another fall when chasing his cat where he fell on his right shoulder. He reports pain in right shoulder.   He had a recent COVID-19 infection back in March.  He reports intermittent cough with SOB. He notes he takes 2 Rolaids at night. We discussed potentially trying antacids which he is agreeable to as he will begin steroids soon and was advised of the possible symptoms and side effects from taking steroids.  We discussed CRAB criteria and that he meets at least two of the criterion being  anemia and bone disease.  We discussed getting additional scans for further evaluation which he is agreeable to.  We further discussed starting Daratumumab/Velcade/Dexamethasone/Zometa and Revlimid for treatment which he is agreeable to. We also discussed starting steroids before treatment and taking an acid suppressant which he was also agreeable to.  We discussed getting Senna and taking it as needed for constipation.  Labs done today were reviewed in detail.  We discussed his recent bone  marrow biopsy and aspiration done 02/06/2022.  We discussed PET/CT scan done 01/28/2022.  We discussed CT right shoulder w/o contrast done 11/13/2021  INTERVAL HISTORY:  Anthony [name] is a 61 y.o. male is here for continued evaluation and management of multiple myeloma.  He is here for his toxicity check after his first dose of daratumumab/carfilzomib.  He reports no new toxicities or new skin rashes.  He is also tolerating his dexamethasone and Revlimid okay. Overall pain is being well controlled. He has an upcoming follow-up with interventional radiology for evaluation of possible vertebroplasty. Improved p.o. intake. Labs done today were reviewed in detail with him.   MEDICAL HISTORY:  Past Medical History:  Diagnosis Date   Back pain    Cancer ([place])    Multiple myeloma ([place]) 02/15/2022    SURGICAL HISTORY: No past surgical history on file.  SOCIAL HISTORY: Social History   Socioeconomic History   Marital status: Married    Spouse name: Not on file   Number of children: Not on file   Years of education: Not on file   Highest education level: Not on file  Occupational History   Not on file  Tobacco Use   Smoking status: Never   Smokeless tobacco: Former    Types: Chew, Snuff  Vaping Use   Vaping Use: Never used  Substance and Sexual Activity   Alcohol use: Yes    Alcohol/week: 2.0 standard drinks of alcohol    Types: 2 Glasses of wine per week   Drug use: No   Sexual  activity: Yes  Other Topics Concern   Not on file  Social History Narrative   Not on file   Social Determinants of Health   Financial Resource Strain: Not on file  Food Insecurity: Not on file  Transportation Needs: Not on file  Physical Activity: Not on file  Stress: Not on file  Social Connections: Not on file  Intimate Partner Violence: Not on file    FAMILY HISTORY: Family History  Problem Relation Age of Onset   Rheum arthritis Mother    Other Sister        Pre-cancerous  uterine mass;    Rheum arthritis Sister    Bone cancer Paternal Grandfather    Brain cancer Maternal Uncle    Brain cancer Maternal Aunt    Osteoporosis Neg Hx     ALLERGIES:  has No Known Allergies.  MEDICATIONS:  Current Outpatient Medications  Medication Sig Dispense Refill   aluminum chloride (DRYSOL) 20 % external solution Apply 1 application topically at bedtime.     apixaban (ELIQUIS) 5 MG TABS tablet Take 1 tablet (5 mg total) by mouth 2 (two) times daily. 60 tablet 5   calcium-vitamin D (OSCAL WITH D) 250-125 MG-UNIT tablet Take 1 tablet by mouth daily.     cyclobenzaprine (FLEXERIL) 10 MG tablet Take 10 mg by mouth 3 (three) times daily as needed for muscle spasms.     diclofenac sodium (VOLTAREN) 1 % GEL Apply 2 g topically as needed.     dronabinol (MARINOL) 2.5 MG capsule Take 1 capsule (2.5 mg total) by mouth 2 (two) times daily before a meal. 60 capsule 0   ergocalciferol (VITAMIN D2) 1.25 MG (50000 UT) capsule Take 1 capsule (50,000 Units total) by mouth 2 (two) times daily. 12 capsule 2   esomeprazole (NEXIUM) 40 MG capsule Take 1 capsule (40 mg total) by mouth daily before breakfast. 30 capsule 1   fentaNYL (DURAGESIC) 25 MCG/HR Place 1 patch onto the skin every 3 (three) days. 10 patch 0   furosemide (LASIX) 20 MG tablet Take 1 tablet (20 mg total) by mouth daily. 30 tablet 1   lidocaine (LIDODERM) 5 % Place 1 patch onto the skin daily. Remove & Discard patch within 12 hours or as directed by MD 30 patch 0   methylPREDNISolone (MEDROL DOSEPAK) 4 MG TBPK tablet Take 6 pills by mouth day 1, 5 on day 2, 4 on day 3, 3 on day 4, 2 on day 5, 1 on day 6 21 tablet 0   Multiple Vitamin (MULTIVITAMIN) tablet Take 1 tablet by mouth daily.     Omega-3 Fatty Acids (FISH OIL) 1360 MG CAPS Take 1 capsule by mouth daily.     ondansetron (ZOFRAN) 8 MG tablet Take 1 tablet (8 mg total) by mouth every 8 (eight) hours as needed for nausea or vomiting. 30 tablet 0   Oxycodone HCl 10 MG TABS  Take 1/2 TO 1 tablet (5-10 mg total) by mouth every 4 (four) hours as needed for severe pain or moderate pain (cancer related pain). 30 tablet 0   REVLIMID 15 MG capsule TAKE 1 CAPSULE DAILY FOR 14 DAYS ON AND 7 DAYS OFF 14 capsule 0   senna-docusate (SENNA S) 8.6-50 MG tablet Take 2 tablets by mouth at bedtime. 60 tablet 1   terbinafine (LAMISIL) 1 % cream Apply 1 application topically 2 (two) times daily.     No current facility-administered medications for this visit.    REVIEW OF SYSTEMS:  10 Point review of Systems was done is negative except as noted above.  PHYSICAL EXAMINATION: .BP 123/88 (BP Location: Left Arm, Patient Position: Sitting)   Pulse 86   Temp 97.7 F (36.5 C) (Oral)   Resp 18   Wt 192 lb 11.2 oz (87.4 kg)   SpO2 98%   BMI 30.18 kg/m   NAD GENERAL:alert, in no acute distress and comfortable SKIN: no acute rashes, no significant lesions EYES: conjunctiva are pink and non-injected, sclera anicteric OROPHARYNX: MMM, no exudates, no oropharyngeal erythema or ulceration NECK: supple, no JVD LYMPH:  no palpable lymphadenopathy in the cervical, axillary or inguinal regions LUNGS: clear to auscultation b/l with normal respiratory effort HEART: regular rate & rhythm ABDOMEN:  normoactive bowel sounds , non tender, not distended. Extremity: no pedal edema PSYCH: alert & oriented x 3 with fluent speech NEURO: no focal motor/sensory deficits   LABORATORY DATA:  I have reviewed the data as listed .    Latest Ref Rng & Units 04/15/2022    3:20 PM 04/08/2022    7:46 AM 03/29/2022    8:24 AM  CBC  WBC 4.0 - 10.5 K/uL 4.3  4.2  4.8   Hemoglobin 13.0 - 17.0 g/dL 13.9  13.2  14.2   Hematocrit 39.0 - 52.0 % 40.9  39.4  42.4   Platelets 150 - 400 K/uL 159  188  228       Latest Ref Rng & Units 04/15/2022    3:20 PM 04/08/2022    7:46 AM 03/29/2022    8:24 AM  CMP  Glucose 70 - 99 mg/dL 93  106  96   BUN 8 - 23 mg/dL _0 Creatinine 0.61 - 1.24 mg/dL 0.59   0.81  0.72   Sodium 135 - 145 mmol/L 134  135  136   Potassium 3.5 - 5.1 mmol/L 4.1  3.9  4.2   Chloride 98 - 111 mmol/L 104  105  104   CO2 22 - 32 mmol/L _1 Calcium 8.9 - 10.3 mg/dL 9.1  8.4  8.9   Total Protein 6.5 - 8.1 g/dL 6.4  6.3  7.1   Total Bilirubin 0.3 - 1.2 mg/dL 0.5  0.5  0.5   Alkaline Phos 38 - 126 U/L 97  104  177   AST 15 - 41 U/L _2 ALT 0 - 44 U/L _3 02/06/2022 Molecular pathology   RADIOGRAPHIC STUDIES: I have personally reviewed the radiological images as listed and agreed with the findings in the report. No results found.   ASSESSMENT & PLAN:   61 y.o. very pleasant male with  1. Newly diagnosed multiple myeloma with extensive bone metastases and patholgiic fracture rt shoulder -Recent bone marrow biopsy and aspiration done 02/06/2022 revealed hypercellular bone marrow with plasma cell neoplasm.  Plan -Labs done today were discussed in detail with the patient. Last myeloma labs from 2 weeks ago showed decrease in M spike to 0.6 g/dL -No significant toxicities from his newly started carfilzomib.  Patient has not had any new rashes on the daratumumab/carfilzomib/Revlimid/dexamethasone regimen. -We shall continue with the current treatment regimen at this time. -Continue current pain management with fentanyl patch and as needed oxycodone -Continue to optimize bowel regimen -Continue monthly Zometa   Follow up: As per appointment orders   The total time spent in the appointment was 70  minutes*.  All of the patient's questions were answered with apparent satisfaction. The patient knows to call the clinic with any problems, questions or concerns.   Sullivan Lone MD MS AAHIVMS [facility] Hematology/Oncology Physician [facility]  .*Total Encounter Time as defined by the Centers for Medicare and Medicaid Services includes, in addition to the face-to-face time of a patient visit (documented in the note above)  non-face-to-face time: obtaining and reviewing outside history, ordering and reviewing medications, tests or procedures, care coordination (communications with other health care professionals or caregivers) and documentation in the medical record.

## 2022-04-23 ENCOUNTER — Other Ambulatory Visit: Payer: BC Managed Care – PPO

## 2022-04-23 ENCOUNTER — Other Ambulatory Visit: Payer: Self-pay

## 2022-04-23 ENCOUNTER — Ambulatory Visit
Admission: RE | Admit: 2022-04-23 | Discharge: 2022-04-23 | Disposition: A | Discharge status: Home / Self Care | Payer: BC Managed Care – PPO | Source: Ambulatory Visit | Attending: Hematology | Admitting: Hematology

## 2022-04-23 DIAGNOSIS — C9000 Multiple myeloma not having achieved remission: Secondary | ICD-10-CM

## 2022-04-23 DIAGNOSIS — M8458XA Pathological fracture in neoplastic disease, other specified site, initial encounter for fracture: Secondary | ICD-10-CM | POA: Diagnosis not present

## 2022-04-23 HISTORY — PX: IR RADIOLOGIST EVAL & MGMT: IMG5224

## 2022-04-23 MED ORDER — ESOMEPRAZOLE MAGNESIUM 40 MG PO CPDR: 40.0000 mg | DELAYED_RELEASE_CAPSULE | Freq: Every day | ORAL | 1 refills | Status: DC

## 2022-04-23 MED FILL — Dexamethasone Sodium Phosphate Inj 100 MG/10ML: INTRAMUSCULAR | Qty: 2 | Status: AC

## 2022-04-23 NOTE — Consult Note (Signed)
Chief Complaint: Patient was seen in consultation today for multiple myeloma with multiple pathologic compression fractures at the request of Brunetta Genera  Referring Physician(s): Brunetta Genera  History of Present Illness: Anthony [name] is a 61 y.o. male with relatively recently diagnosed multiple myeloma.  He is referred at the kind request of Dr. Irene Limbo to be evaluated for highly symptomatic pathologic compression fractures of the thoracic and lumbar spine.  Anthony [name] comes in today accompanied by his wife.  He reports that back in April or May he began having more severe pain in the middle of his back extending into the lower back.  He has had issues with lower lumbar back pain related to degenerative disc disease and facet arthropathy for many years and at first thought that this was just a significant flare.  However, when his pain persisted he sought additional treatment and imaging that revealed both compression fractures and evidence of an underlying malignancy.  Ultimately, he was diagnosed with multiple myeloma.  Over the past several months his pain has waxed and waned and is at times severe.  He reports that his pain can escalate as high as a 10 out of 10 on a 10 point scale.  Additionally, he finds the pain quite debilitating.  He scores a 17 out of 24 on the [employer] disability questionnaire.  He denies lower extremity numbness, weakness, paresthesias or changes in bowel or bladder function.  His pain is worst when rising from a lying or sitting position to standing.  While his pain remains present, it is slowly improving.   Past Medical History:  Diagnosis Date   Back pain    Cancer ([place])    Multiple myeloma ([place]) 02/15/2022    Past Surgical History:  Procedure Laterality Date   IR RADIOLOGIST EVAL & MGMT  04/23/2022    Allergies: Patient has no known allergies.  Medications: Prior to Admission medications   Medication Sig Start Date End Date  Taking? Authorizing Provider  aluminum chloride (DRYSOL) 20 % external solution Apply 1 application topically at bedtime.   Yes [provider]  apixaban (ELIQUIS) 5 MG TABS tablet Take 1 tablet (5 mg total) by mouth 2 (two) times daily. 04/10/22  Yes Brunetta Genera, MD  calcium-vitamin D (OSCAL WITH D) 250-125 MG-UNIT tablet Take 1 tablet by mouth daily.   Yes [provider]  cyclobenzaprine (FLEXERIL) 10 MG tablet Take 10 mg by mouth 3 (three) times daily as needed for muscle spasms.   Yes [provider]  diclofenac sodium (VOLTAREN) 1 % GEL Apply 2 g topically as needed.   Yes [provider]  dronabinol (MARINOL) 2.5 MG capsule Take 1 capsule (2.5 mg total) by mouth 2 (two) times daily before a meal. 03/29/22  Yes Brunetta Genera, MD  ergocalciferol (VITAMIN D2) 1.25 MG (50000 UT) capsule Take 1 capsule (50,000 Units total) by mouth 2 (two) times daily. 04/03/22  Yes Brunetta Genera, MD  fentaNYL (DURAGESIC) 25 MCG/HR Place 1 patch onto the skin every 3 (three) days. 03/22/22  Yes Brunetta Genera, MD  lidocaine (LIDODERM) 5 % Place 1 patch onto the skin daily. Remove & Discard patch within 12 hours or as directed by MD 03/11/22  Yes Brunetta Genera, MD  methylPREDNISolone (MEDROL DOSEPAK) 4 MG TBPK tablet Take 6 pills by mouth day 1, 5 on day 2, 4 on day 3, 3 on day 4, 2 on day 5, 1 on day 6 03/08/22  Yes Sherol Dade E, PA-C  Multiple Vitamin (MULTIVITAMIN) tablet Take 1 tablet by mouth daily.   Yes [provider]  Omega-3 Fatty Acids (FISH OIL) 1360 MG CAPS Take 1 capsule by mouth daily.   Yes [provider]  Oxycodone HCl 10 MG TABS Take 1/2 TO 1 tablet (5-10 mg total) by mouth every 4 (four) hours as needed for severe pain or moderate pain (cancer related pain). 04/09/22  Yes Brunetta Genera, MD  REVLIMID 15 MG capsule TAKE 1 CAPSULE DAILY FOR 14 DAYS ON AND 7 DAYS OFF 04/10/22  Yes Brunetta Genera, MD   senna-docusate (SENNA S) 8.6-50 MG tablet Take 2 tablets by mouth at bedtime. 02/15/22  Yes Brunetta Genera, MD  terbinafine (LAMISIL) 1 % cream Apply 1 application topically 2 (two) times daily.   Yes [provider]  esomeprazole (NEXIUM) 40 MG capsule Take 1 capsule (40 mg total) by mouth daily before breakfast. 04/23/22   Brunetta Genera, MD  furosemide (LASIX) 20 MG tablet Take 1 tablet (20 mg total) by mouth daily. Patient not taking: Reported on 04/23/2022 03/11/22   Brunetta Genera, MD  ondansetron (ZOFRAN) 8 MG tablet Take 1 tablet (8 mg total) by mouth every 8 (eight) hours as needed for nausea or vomiting. Patient not taking: Reported on 04/23/2022 03/04/22   Brunetta Genera, MD  prochlorperazine (COMPAZINE) 10 MG tablet Take 1 tablet (10 mg total) by mouth every 6 (six) hours as needed (Nausea or vomiting). 02/15/22 04/01/22  Brunetta Genera, MD     Family History  Problem Relation Age of Onset   Rheum arthritis Mother    Other Sister        Pre-cancerous uterine mass;    Rheum arthritis Sister    Bone cancer Paternal Grandfather    Brain cancer Maternal Uncle    Brain cancer Maternal Aunt    Osteoporosis Neg Hx     Social History   Socioeconomic History   Marital status: Married    Spouse name: Not on file   Number of children: Not on file   Years of education: Not on file   Highest education level: Not on file  Occupational History   Not on file  Tobacco Use   Smoking status: Never   Smokeless tobacco: Former    Types: Chew, Snuff  Vaping Use   Vaping Use: Never used  Substance and Sexual Activity   Alcohol use: Yes    Alcohol/week: 2.0 standard drinks of alcohol    Types: 2 Glasses of wine per week   Drug use: No   Sexual activity: Yes  Other Topics Concern   Not on file  Social History Narrative   Not on file   Social Determinants of Health   Financial Resource Strain: Not on file  Food Insecurity: Not on file   Transportation Needs: Not on file  Physical Activity: Not on file  Stress: Not on file  Social Connections: Not on file    ECOG Status: 2 - Symptomatic, <50% confined to bed  Review of Systems: A 12 point ROS discussed and pertinent positives are indicated in the HPI above.  All other systems are negative.  Review of Systems  Vital Signs: BP 138/84 (BP Location: Right Arm, Patient Position: Sitting, Cuff Size: Normal)   Pulse 76   Temp 98.6 F (37 C) (Oral)   Resp 16   Wt 88.5 kg   SpO2 96%   BMI 30.54 kg/m  Physical Exam Constitutional:      General: He is not in acute distress.    Appearance: Normal appearance.  HENT:     Head: Normocephalic and atraumatic.  Eyes:     General: No scleral icterus. Cardiovascular:     Rate and Rhythm: Normal rate.  Pulmonary:     Effort: Pulmonary effort is normal.  Abdominal:     General: Abdomen is flat.     Palpations: Abdomen is soft.  Musculoskeletal:     Thoracic back: Spasms and tenderness present.       Back:     Comments: Focal TTP at T12 spinous process and, to a lesser extent, at L2.   Skin:    General: Skin is warm and dry.  Neurological:     Mental Status: He is alert and oriented to person, place, and time.  Psychiatric:        Mood and Affect: Mood normal.        Behavior: Behavior normal.       Imaging: IR Radiologist Eval & Mgmt  Result Date: 04/23/2022 EXAM: NEW PATIENT OFFICE VISIT CHIEF COMPLAINT: SEE EPIC NOTE HISTORY OF PRESENT ILLNESS: SEE EPIC NOTE REVIEW OF SYSTEMS: SEE EPIC NOTE PHYSICAL EXAMINATION: SEE EPIC NOTE ASSESSMENT AND PLAN: SEE EPIC NOTE Electronically Signed   By: Jacqulynn Cadet M.D.   On: 04/23/2022 12:49    Labs:  CBC: Recent Labs    03/22/22 0829 03/29/22 0824 04/08/22 0746 04/15/22 1520  WBC 3.0* 4.8 4.2 4.3  HGB 14.0 14.2 13.2 13.9  HCT 41.9 42.4 39.4 40.9  PLT 215 228 188 159    COAGS: No results for input(s): "INR", "APTT" in the last 8760  hours.  BMP: Recent Labs    03/22/22 0829 03/29/22 0824 04/08/22 0746 04/15/22 1520  NA 136 136 135 134*  K 4.2 4.2 3.9 4.1  CL 104 104 105 104  CO2 _0 GLUCOSE 115* 96 106* 93  BUN _1 CALCIUM 8.3* 8.9 8.4* 9.1  CREATININE 0.77 0.72 0.81 0.59*  GFRNONAA >60 >60 >60 >60    LIVER FUNCTION TESTS: Recent Labs    03/22/22 0829 03/29/22 0824 04/08/22 0746 04/15/22 1520  BILITOT 0.4 0.5 0.5 0.5  AST 13* 12* 17 13*  ALT _2 ALKPHOS 186* 177* 104 97  PROT 7.1 7.1 6.3* 6.4*  ALBUMIN 3.9 4.1 3.6 3.9    TUMOR MARKERS: No results for input(s): "AFPTM", "CEA", "CA199", "CHROMGRNA" in the last 8760 hours.  Assessment and Plan:  Very pleasant 61 year old gentleman recently diagnosed with multiple myeloma.  Unfortunately, he has extensive involvement throughout the thoracolumbar spine with evidence of mild pathologic fractures at T6, T8 and T9.  More significant pathologic compression fractures include T12 where there is approximately 50% height loss anteriorly as well as at L2 where there is mild posterior retropulsion.  Clinically, he appears to be most symptomatic at the T12 and L2 compression fractures.  We discussed the natural history of his disease process.  He understands that with effective treatment, the compression fractures will likely heal on their own although the time course is indeterminate.  He is at risk for further height loss at the sites of fracture.  He explained that he is rather conservative and is not interested in any procedure that would not offer a significant benefit.  After discussion with both he and his wife, we agree that the more superior thoracic fractures at T6,  T8 and T9 seem to be asymptomatic and can be addressed with a conservative approach and watchful waiting.  However, his fractures at T12 and L2 seem to be causing him more significant discomfort and he is an excellent candidate for combined radiofrequency ablation and  cement augmentation with balloon kyphoplasty (OsteoCool).  This should significantly accelerate his pain relief and help him return to a more robust quality of life.  I was able to answer all of his questions.  He and his wife agree with the treatment plan and are eager to proceed.  1.)  Please schedule for outpatient ostial cool of symptomatic pathologic compression fractures at T12 and L2.  Treatment can be outpatient and patient prefers Tuesday, September 19 if possible for scheduling reasons.  Thank you for this interesting consult.  I greatly enjoyed meeting Anthony [name] and look forward to participating in their care.  A copy of this report was sent to the requesting provider on this date.  Electronically Signed: Criselda Peaches 04/23/2022, 12:56 PM   I spent a total of  60 Minutes   in face to face in clinical consultation, greater than 50% of which was counseling/coordinating care for multiple myeloma with pathologic compression fractures.

## 2022-04-24 ENCOUNTER — Inpatient Hospital Stay: Payer: BC Managed Care – PPO

## 2022-04-24 ENCOUNTER — Other Ambulatory Visit: Payer: Self-pay

## 2022-04-24 ENCOUNTER — Encounter: Payer: Self-pay | Admitting: Hematology

## 2022-04-24 ENCOUNTER — Inpatient Hospital Stay: Payer: BC Managed Care – PPO | Attending: Physician Assistant

## 2022-04-24 ENCOUNTER — Inpatient Hospital Stay: Admission: RE | Admit: 2022-04-24 | Payer: BC Managed Care – PPO | Source: Ambulatory Visit

## 2022-04-24 ENCOUNTER — Other Ambulatory Visit (HOSPITAL_COMMUNITY): Payer: Self-pay

## 2022-04-24 VITALS — BP 153/80 | HR 98 | Temp 98.7°F | Resp 18 | Wt 192.1 lb

## 2022-04-24 DIAGNOSIS — M545 Low back pain, unspecified: Secondary | ICD-10-CM | POA: Insufficient documentation

## 2022-04-24 DIAGNOSIS — C9000 Multiple myeloma not having achieved remission: Secondary | ICD-10-CM | POA: Diagnosis not present

## 2022-04-24 DIAGNOSIS — G8929 Other chronic pain: Secondary | ICD-10-CM | POA: Diagnosis not present

## 2022-04-24 DIAGNOSIS — Z7189 Other specified counseling: Secondary | ICD-10-CM

## 2022-04-24 DIAGNOSIS — R059 Cough, unspecified: Secondary | ICD-10-CM | POA: Diagnosis not present

## 2022-04-24 DIAGNOSIS — Z7961 Long term (current) use of immunomodulator: Secondary | ICD-10-CM | POA: Diagnosis not present

## 2022-04-24 DIAGNOSIS — Z5112 Encounter for antineoplastic immunotherapy: Secondary | ICD-10-CM | POA: Insufficient documentation

## 2022-04-24 DIAGNOSIS — Z79891 Long term (current) use of opiate analgesic: Secondary | ICD-10-CM | POA: Insufficient documentation

## 2022-04-24 LAB — CBC WITH DIFFERENTIAL (CANCER CENTER ONLY)
Abs Immature Granulocytes: 0 10*3/uL (ref 0.00–0.07)
Basophils Absolute: 0 10*3/uL (ref 0.0–0.1)
Basophils Relative: 1 %
Eosinophils Absolute: 0.4 10*3/uL (ref 0.0–0.5)
Eosinophils Relative: 14 %
HCT: 40.7 % (ref 39.0–52.0)
Hemoglobin: 13.7 g/dL (ref 13.0–17.0)
Immature Granulocytes: 0 %
Lymphocytes Relative: 32 %
Lymphs Abs: 1 10*3/uL (ref 0.7–4.0)
MCH: 28 pg (ref 26.0–34.0)
MCHC: 33.7 g/dL (ref 30.0–36.0)
MCV: 83.2 fL (ref 80.0–100.0)
Monocytes Absolute: 0.4 10*3/uL (ref 0.1–1.0)
Monocytes Relative: 13 %
Neutro Abs: 1.3 10*3/uL — ABNORMAL LOW (ref 1.7–7.7)
Neutrophils Relative %: 40 %
Platelet Count: 134 10*3/uL — ABNORMAL LOW (ref 150–400)
RBC: 4.89 MIL/uL (ref 4.22–5.81)
RDW: 14.8 % (ref 11.5–15.5)
WBC Count: 3.2 10*3/uL — ABNORMAL LOW (ref 4.0–10.5)
nRBC: 0 % (ref 0.0–0.2)

## 2022-04-24 LAB — CMP (CANCER CENTER ONLY)
ALT: 15 U/L (ref 0–44)
AST: 12 U/L — ABNORMAL LOW (ref 15–41)
Albumin: 3.9 g/dL (ref 3.5–5.0)
Alkaline Phosphatase: 77 U/L (ref 38–126)
Anion gap: 6 (ref 5–15)
BUN: 9 mg/dL (ref 8–23)
CO2: 24 mmol/L (ref 22–32)
Calcium: 8.7 mg/dL — ABNORMAL LOW (ref 8.9–10.3)
Chloride: 106 mmol/L (ref 98–111)
Creatinine: 0.8 mg/dL (ref 0.61–1.24)
GFR, Estimated: >60 mL/min (ref >60)
Glucose, Bld: 85 mg/dL (ref 70–99)
Potassium: 4.3 mmol/L (ref 3.5–5.1)
Sodium: 136 mmol/L (ref 135–145)
Total Bilirubin: 0.6 mg/dL (ref 0.3–1.2)
Total Protein: 6.1 g/dL — ABNORMAL LOW (ref 6.5–8.1)

## 2022-04-24 MED ORDER — ACETAMINOPHEN 325 MG PO TABS
650.0000 mg | ORAL_TABLET | Freq: Once | ORAL | Status: AC
  Administered 2022-04-24: 650 mg via ORAL
  Filled 2022-04-24: qty 2

## 2022-04-24 MED ORDER — DEXTROSE 5 % IV SOLN
70.0000 mg/m2 | Freq: Once | INTRAVENOUS | Status: AC
  Administered 2022-04-24: 140 mg via INTRAVENOUS
  Filled 2022-04-24: qty 60

## 2022-04-24 MED ORDER — SODIUM CHLORIDE 0.9 % IV SOLN
20.0000 mg | Freq: Once | INTRAVENOUS | Status: AC
  Administered 2022-04-24: 20 mg via INTRAVENOUS
  Filled 2022-04-24: qty 20

## 2022-04-24 MED ORDER — SODIUM CHLORIDE 0.9 % IV SOLN: Freq: Once | INTRAVENOUS | Status: AC

## 2022-04-24 MED ORDER — MONTELUKAST SODIUM 10 MG PO TABS
10.0000 mg | ORAL_TABLET | Freq: Once | ORAL | Status: AC
  Administered 2022-04-24: 10 mg via ORAL
  Filled 2022-04-24: qty 1

## 2022-04-24 MED ORDER — SODIUM CHLORIDE 0.9 % IV SOLN
16.0000 mg/kg | Freq: Once | INTRAVENOUS | Status: AC
  Administered 2022-04-24: 1400 mg via INTRAVENOUS
  Filled 2022-04-24: qty 60

## 2022-04-24 MED ORDER — DIPHENHYDRAMINE HCL 25 MG PO CAPS
50.0000 mg | ORAL_CAPSULE | Freq: Once | ORAL | Status: AC
  Administered 2022-04-24: 50 mg via ORAL
  Filled 2022-04-24: qty 2

## 2022-04-24 MED ORDER — FENTANYL 25 MCG/HR TD PT72
1.0000 | MEDICATED_PATCH | TRANSDERMAL | 0 refills | Status: DC
  Filled 2022-04-24: qty 10, 30d supply, fill #0

## 2022-04-24 MED ORDER — FAMOTIDINE IN NACL 20-0.9 MG/50ML-% IV SOLN
20.0000 mg | Freq: Once | INTRAVENOUS | Status: AC
  Administered 2022-04-24: 20 mg via INTRAVENOUS
  Filled 2022-04-24: qty 50

## 2022-04-24 NOTE — Patient Instructions (Signed)
[facility]  Discharge Instructions: Thank you for choosing [facility] to provide your oncology and hematology care.   If you have a lab appointment with the [facility], please go directly to the [facility] and check in at the registration area.   Wear comfortable clothing and clothing appropriate for easy access to any Portacath or PICC line.   We strive to give you quality time with your provider. You may need to reschedule your appointment if you arrive late (15 or more minutes).  Arriving late affects you and other patients whose appointments are after yours.  Also, if you miss three or more appointments without notifying the office, you may be dismissed from the clinic at the provider's discretion.      For prescription refill requests, have your pharmacy contact our office and allow 72 hours for refills to be completed.    Today you received the following chemotherapy and/or immunotherapy agents: Kyprolis, Daratumumab.       To help prevent nausea and vomiting after your treatment, we encourage you to take your nausea medication as directed.  BELOW ARE SYMPTOMS THAT SHOULD BE REPORTED IMMEDIATELY: *FEVER GREATER THAN 100.4 F (38 C) OR HIGHER *CHILLS OR SWEATING *NAUSEA AND VOMITING THAT IS NOT CONTROLLED WITH YOUR NAUSEA MEDICATION *UNUSUAL SHORTNESS OF BREATH *UNUSUAL BRUISING OR BLEEDING *URINARY PROBLEMS (pain or burning when urinating, or frequent urination) *BOWEL PROBLEMS (unusual diarrhea, constipation, pain near the anus) TENDERNESS IN MOUTH AND THROAT WITH OR WITHOUT PRESENCE OF ULCERS (sore throat, sores in mouth, or a toothache) UNUSUAL RASH, SWELLING OR PAIN  UNUSUAL VAGINAL DISCHARGE OR ITCHING   Items with * indicate a potential emergency and should be followed up as soon as possible or go to the Emergency Department if any problems should occur.  Please show the CHEMOTHERAPY ALERT CARD or IMMUNOTHERAPY ALERT CARD  at check-in to the Emergency Department and triage nurse.  Should you have questions after your visit or need to cancel or reschedule your appointment, please contact [facility]  Dept: [phone]  and follow the prompts.  Office hours are 8:00 a.m. to 4:30 p.m. Monday - Friday. Please note that voicemails left after 4:00 p.m. may not be returned until the following business day.  We are closed weekends and major holidays. You have access to a nurse at all times for urgent questions. Please call the main number to the clinic Dept: [phone] and follow the prompts.   For any non-urgent questions, you may also contact your provider using MyChart. We now offer e-Visits for anyone 50 and older to request care online for non-urgent symptoms. For details visit [email or web address].   Also download the MyChart app! Go to the app store, search "MyChart", open the app, select [facility], and log in with your MyChart username and password.  Masks are optional in the cancer centers. If you would like for your care team to wear a mask while they are taking care of you, please let them know. You may have one support person who is at least 62 years old accompany you for your appointments.

## 2022-04-24 NOTE — Progress Notes (Signed)
Pt ok for tx today with ANC of 1.3 per Dr Irene Limbo.

## 2022-04-26 ENCOUNTER — Other Ambulatory Visit: Payer: Self-pay

## 2022-04-26 DIAGNOSIS — C9000 Multiple myeloma not having achieved remission: Secondary | ICD-10-CM

## 2022-04-26 MED ORDER — REVLIMID 15 MG PO CAPS: ORAL_CAPSULE | ORAL | 0 refills | Status: DC

## 2022-04-29 ENCOUNTER — Inpatient Hospital Stay: Payer: BC Managed Care – PPO

## 2022-04-29 ENCOUNTER — Other Ambulatory Visit: Payer: Self-pay | Admitting: Hematology

## 2022-04-29 ENCOUNTER — Other Ambulatory Visit: Payer: Self-pay

## 2022-04-29 ENCOUNTER — Inpatient Hospital Stay (HOSPITAL_BASED_OUTPATIENT_CLINIC_OR_DEPARTMENT_OTHER): Payer: BC Managed Care – PPO | Admitting: Hematology

## 2022-04-29 VITALS — BP 130/83 | HR 85 | Temp 97.5°F | Resp 18 | Ht 67.0 in | Wt 192.1 lb

## 2022-04-29 DIAGNOSIS — Z79891 Long term (current) use of opiate analgesic: Secondary | ICD-10-CM | POA: Diagnosis not present

## 2022-04-29 DIAGNOSIS — R059 Cough, unspecified: Secondary | ICD-10-CM | POA: Diagnosis not present

## 2022-04-29 DIAGNOSIS — C9000 Multiple myeloma not having achieved remission: Secondary | ICD-10-CM

## 2022-04-29 DIAGNOSIS — G8929 Other chronic pain: Secondary | ICD-10-CM | POA: Diagnosis not present

## 2022-04-29 DIAGNOSIS — Z7961 Long term (current) use of immunomodulator: Secondary | ICD-10-CM | POA: Diagnosis not present

## 2022-04-29 DIAGNOSIS — Z7189 Other specified counseling: Secondary | ICD-10-CM

## 2022-04-29 DIAGNOSIS — Z5112 Encounter for antineoplastic immunotherapy: Secondary | ICD-10-CM | POA: Diagnosis not present

## 2022-04-29 DIAGNOSIS — M545 Low back pain, unspecified: Secondary | ICD-10-CM | POA: Diagnosis not present

## 2022-04-29 LAB — CBC WITH DIFFERENTIAL (CANCER CENTER ONLY)
Abs Immature Granulocytes: 0.01 10*3/uL (ref 0.00–0.07)
Basophils Absolute: 0 10*3/uL (ref 0.0–0.1)
Basophils Relative: 0 %
Eosinophils Absolute: 0.3 10*3/uL (ref 0.0–0.5)
Eosinophils Relative: 7 %
HCT: 41.7 % (ref 39.0–52.0)
Hemoglobin: 14 g/dL (ref 13.0–17.0)
Immature Granulocytes: 0 %
Lymphocytes Relative: 21 %
Lymphs Abs: 1.1 10*3/uL (ref 0.7–4.0)
MCH: 28.1 pg (ref 26.0–34.0)
MCHC: 33.6 g/dL (ref 30.0–36.0)
MCV: 83.7 fL (ref 80.0–100.0)
Monocytes Absolute: 0.7 10*3/uL (ref 0.1–1.0)
Monocytes Relative: 13 %
Neutro Abs: 3 10*3/uL (ref 1.7–7.7)
Neutrophils Relative %: 59 %
Platelet Count: 153 10*3/uL (ref 150–400)
RBC: 4.98 MIL/uL (ref 4.22–5.81)
RDW: 14.8 % (ref 11.5–15.5)
WBC Count: 5 10*3/uL (ref 4.0–10.5)
nRBC: 0 % (ref 0.0–0.2)

## 2022-04-30 ENCOUNTER — Other Ambulatory Visit (HOSPITAL_COMMUNITY): Payer: Self-pay

## 2022-04-30 ENCOUNTER — Other Ambulatory Visit: Payer: Self-pay | Admitting: Hematology

## 2022-04-30 ENCOUNTER — Encounter: Payer: Self-pay | Admitting: Hematology

## 2022-04-30 DIAGNOSIS — C9000 Multiple myeloma not having achieved remission: Secondary | ICD-10-CM

## 2022-04-30 MED ORDER — DRONABINOL 5 MG PO CAPS
5.0000 mg | ORAL_CAPSULE | Freq: Two times a day (BID) | ORAL | 0 refills | Status: DC
  Filled 2022-04-30: qty 60, 30d supply, fill #0

## 2022-04-30 MED FILL — Dexamethasone Sodium Phosphate Inj 100 MG/10ML: INTRAMUSCULAR | Qty: 2 | Status: AC

## 2022-05-01 ENCOUNTER — Other Ambulatory Visit: Payer: Self-pay | Admitting: Hematology

## 2022-05-01 ENCOUNTER — Inpatient Hospital Stay: Payer: BC Managed Care – PPO | Admitting: Dietician

## 2022-05-01 ENCOUNTER — Inpatient Hospital Stay: Payer: BC Managed Care – PPO

## 2022-05-01 ENCOUNTER — Other Ambulatory Visit: Payer: Self-pay

## 2022-05-01 VITALS — BP 126/79 | HR 80 | Temp 98.9°F | Resp 16 | Ht 67.0 in | Wt 190.8 lb

## 2022-05-01 DIAGNOSIS — M545 Low back pain, unspecified: Secondary | ICD-10-CM | POA: Diagnosis not present

## 2022-05-01 DIAGNOSIS — R059 Cough, unspecified: Secondary | ICD-10-CM | POA: Diagnosis not present

## 2022-05-01 DIAGNOSIS — Z79891 Long term (current) use of opiate analgesic: Secondary | ICD-10-CM | POA: Diagnosis not present

## 2022-05-01 DIAGNOSIS — C9000 Multiple myeloma not having achieved remission: Secondary | ICD-10-CM

## 2022-05-01 DIAGNOSIS — Z7961 Long term (current) use of immunomodulator: Secondary | ICD-10-CM | POA: Diagnosis not present

## 2022-05-01 DIAGNOSIS — Z7189 Other specified counseling: Secondary | ICD-10-CM

## 2022-05-01 DIAGNOSIS — Z5112 Encounter for antineoplastic immunotherapy: Secondary | ICD-10-CM | POA: Diagnosis not present

## 2022-05-01 DIAGNOSIS — G8929 Other chronic pain: Secondary | ICD-10-CM | POA: Diagnosis not present

## 2022-05-01 MED ORDER — SODIUM CHLORIDE 0.9 % IV SOLN
16.0000 mg/kg | Freq: Once | INTRAVENOUS | Status: AC
  Administered 2022-05-01: 1400 mg via INTRAVENOUS
  Filled 2022-05-01: qty 60

## 2022-05-01 MED ORDER — REVLIMID 15 MG PO CAPS: ORAL_CAPSULE | ORAL | 0 refills | Status: DC

## 2022-05-01 MED ORDER — FAMOTIDINE IN NACL 20-0.9 MG/50ML-% IV SOLN
20.0000 mg | Freq: Once | INTRAVENOUS | Status: AC
  Administered 2022-05-01: 20 mg via INTRAVENOUS
  Filled 2022-05-01: qty 50

## 2022-05-01 MED ORDER — DIPHENHYDRAMINE HCL 25 MG PO CAPS
50.0000 mg | ORAL_CAPSULE | Freq: Once | ORAL | Status: AC
  Administered 2022-05-01: 50 mg via ORAL
  Filled 2022-05-01: qty 2

## 2022-05-01 MED ORDER — ACETAMINOPHEN 325 MG PO TABS
650.0000 mg | ORAL_TABLET | Freq: Once | ORAL | Status: AC
  Administered 2022-05-01: 650 mg via ORAL
  Filled 2022-05-01: qty 2

## 2022-05-01 MED ORDER — SODIUM CHLORIDE 0.9 % IV SOLN: Freq: Once | INTRAVENOUS | Status: AC

## 2022-05-01 MED ORDER — SODIUM CHLORIDE 0.9 % IV SOLN
20.0000 mg | Freq: Once | INTRAVENOUS | Status: AC
  Administered 2022-05-01: 20 mg via INTRAVENOUS
  Filled 2022-05-01: qty 20

## 2022-05-01 NOTE — Patient Instructions (Signed)
[facility]  Discharge Instructions: Thank you for choosing [facility] to provide your oncology and hematology care.   If you have a lab appointment with the Cancer Center, please go directly to the Cancer Center and check in at the registration area.   Wear comfortable clothing and clothing appropriate for easy access to any Portacath or PICC line.   We strive to give you quality time with your provider. You may need to reschedule your appointment if you arrive late (15 or more minutes).  Arriving late affects you and other patients whose appointments are after yours.  Also, if you miss three or more appointments without notifying the office, you may be dismissed from the clinic at the provider's discretion.      For prescription refill requests, have your pharmacy contact our office and allow 72 hours for refills to be completed.    Today you received the following chemotherapy and/or immunotherapy agents: Darzalex      To help prevent nausea and vomiting after your treatment, we encourage you to take your nausea medication as directed.  BELOW ARE SYMPTOMS THAT SHOULD BE REPORTED IMMEDIATELY: *FEVER GREATER THAN 100.4 F (38 C) OR HIGHER *CHILLS OR SWEATING *NAUSEA AND VOMITING THAT IS NOT CONTROLLED WITH YOUR NAUSEA MEDICATION *UNUSUAL SHORTNESS OF BREATH *UNUSUAL BRUISING OR BLEEDING *URINARY PROBLEMS (pain or burning when urinating, or frequent urination) *BOWEL PROBLEMS (unusual diarrhea, constipation, pain near the anus) TENDERNESS IN MOUTH AND THROAT WITH OR WITHOUT PRESENCE OF ULCERS (sore throat, sores in mouth, or a toothache) UNUSUAL RASH, SWELLING OR PAIN  UNUSUAL VAGINAL DISCHARGE OR ITCHING   Items with * indicate a potential emergency and should be followed up as soon as possible or go to the Emergency Department if any problems should occur.  Please show the CHEMOTHERAPY ALERT CARD or IMMUNOTHERAPY ALERT CARD at check-in to  the Emergency Department and triage nurse.  Should you have questions after your visit or need to cancel or reschedule your appointment, please contact [facility]  Dept: [phone]  and follow the prompts.  Office hours are 8:00 a.m. to 4:30 p.m. Monday - Friday. Please note that voicemails left after 4:00 p.m. may not be returned until the following business day.  We are closed weekends and major holidays. You have access to a nurse at all times for urgent questions. Please call the main number to the clinic Dept: [phone] and follow the prompts.   For any non-urgent questions, you may also contact your provider using MyChart. We now offer e-Visits for anyone 18 and older to request care online for non-urgent symptoms. For details visit [email or web address].   Also download the MyChart app! Go to the app store, search "MyChart", open the app, select [facility], and log in with your MyChart username and password.  Masks are optional in the cancer centers. If you would like for your care team to wear a mask while they are taking care of you, please let them know. You may have one support person who is at least 62 years old accompany you for your appointments. 

## 2022-05-01 NOTE — Progress Notes (Signed)
Nutrition Follow-up:  Patient with multiple myeloma. He is receiving carfilzomib, daratumumab, revlimid + dexamethasone.  Met with patient during infusion. He reports tolerating therapy well. Patient says he has had "no problems" since discontinuing velcade. Patient endorses good appetite. He is taking appetite stimulant (marinol). This has been working well for him. Patient reports he was thinking of stopping this of his own accord so he could lose weight. Patient is eating 3 good meals with good sources of protein as well as drinking ~32 ounces of whole milk. He denies nausea, vomiting, diarrhea. Constipation has resolved with nightly senna + bowl of raisin bran with prunes for daily breakfast. Patient reports lower extremity swelling has resolved. He is no longer taking lasix.   Medications: reviewed   Labs: reviewed   Anthropometrics: Weight 190 lb 12.8 oz today decreased 2 lbs in 7 days - concerning  9/6 - 192 lb 1.9 oz 8/28 - 192 lb 11.2 oz  8/4 - 197 lb 4 oz   NUTRITION DIAGNOSIS: Unintentional weight loss ongoing   INTERVENTION:  Educated on importance of increased calorie and protein energy intake to maintain weight/strength during treatment Encouraged pt to strive for weight maintenance  Continue taking appetite stimulant as prescribed per MD Continue bowel regimen     MONITORING, EVALUATION, GOAL: weight trends, intake   NEXT VISIT: f/u to be scheduled with updated treatment plan

## 2022-05-02 ENCOUNTER — Other Ambulatory Visit: Payer: Self-pay

## 2022-05-02 ENCOUNTER — Encounter: Payer: Self-pay | Admitting: Hematology

## 2022-05-02 DIAGNOSIS — C9000 Multiple myeloma not having achieved remission: Secondary | ICD-10-CM

## 2022-05-03 ENCOUNTER — Other Ambulatory Visit: Payer: Self-pay | Admitting: Hematology

## 2022-05-03 ENCOUNTER — Other Ambulatory Visit: Payer: Self-pay | Admitting: Cardiovascular Disease

## 2022-05-03 ENCOUNTER — Encounter: Payer: Self-pay | Admitting: Hematology

## 2022-05-03 MED ORDER — OXYCODONE HCL 10 MG PO TABS: ORAL_TABLET | ORAL | 0 refills | Status: DC

## 2022-05-06 ENCOUNTER — Telehealth: Payer: Self-pay

## 2022-05-06 ENCOUNTER — Encounter: Payer: Self-pay | Admitting: Hematology

## 2022-05-06 NOTE — Telephone Encounter (Signed)
CSW spoke with patient and scheduled a meeting for 9/19 at 3pm at the [facility].  Patient was referred by Dr. Irene Limbo and his nurse, Eustaquio Maize, to address future financial concerns.

## 2022-05-06 NOTE — Addendum Note (Signed)
Addended by: Sullivan Lone on: 05/06/2022 01:19 AM   Modules accepted: Orders

## 2022-05-06 NOTE — Progress Notes (Addendum)
HEMATOLOGY/ONCOLOGY CLINIC NOTE  Date of Service: .04/29/2022   Patient Care Team: Lennie Odor, [place] as PCP - General (Nurse Practitioner)  CHIEF COMPLAINTS/PURPOSE OF CONSULTATION:  Follow-up for continued evaluation and management of multiple myeloma  HISTORY OF PRESENTING ILLNESS:   Anthony [name] is a wonderful 61 y.o. male who has been referred to [place] by Dr Lennie Odor, PA for evaluation and management of newly diagnosed multiple myeloma. He reports He is doing well.  He reports persistent lower back pain that he has had since he was in his 20's. He notes no previous significant injuries. He further notes that he gets chiropractic adjustments and takes Tylenol to manage his symptoms. He notes less back pain when standing up on his right leg and maintaining weight on his right leg.  He reports previous history of smoking and chewing tobacco over 40 years ago.  He had a recent fall back in February of this year with only a pulled muscle. And he notes another fall when chasing his cat where he fell on his right shoulder. He reports pain in right shoulder.   He had a recent COVID-19 infection back in March.  He reports intermittent cough with SOB. He notes he takes 2 Rolaids at night. We discussed potentially trying antacids which he is agreeable to as he will begin steroids soon and was advised of the possible symptoms and side effects from taking steroids.  We discussed CRAB criteria and that he meets at least two of the criterion being  anemia and bone disease.  We discussed getting additional scans for further evaluation which he is agreeable to.  We further discussed starting Daratumumab/Velcade/Dexamethasone/Zometa and Revlimid for treatment which he is agreeable to. We also discussed starting steroids before treatment and taking an acid suppressant which he was also agreeable to.  We discussed getting Senna and taking it as needed for constipation.  Labs done today  were reviewed in detail.  We discussed his recent bone marrow biopsy and aspiration done 02/06/2022.  We discussed PET/CT scan done 01/28/2022.  We discussed CT right shoulder w/o contrast done 11/13/2021  INTERVAL HISTORY:  Anthony [name] is a 61 y.o. male is here for continued evaluation and management of multiple myeloma. He notes that he is tolerating his treatment without any notable toxicities after dose escalation of his carfilzomib.  No acute new symptoms.  Bone pains are gradually improving.  Has been evaluated by interventional radiology and offered vertebroplasty of pathological fracture sites in the spine. Had questions about disability applications and was referred to the Education officer, museum. Discussed anxieties around treatment and time off from work. Labs done today were discussed in detail with the patient.  MEDICAL HISTORY:  Past Medical History:  Diagnosis Date   Back pain    Cancer ([place])    Multiple myeloma ([place]) 02/15/2022    SURGICAL HISTORY: Past Surgical History:  Procedure Laterality Date   IR RADIOLOGIST EVAL & MGMT  04/23/2022    SOCIAL HISTORY: Social History   Socioeconomic History   Marital status: Married    Spouse name: Not on file   Number of children: Not on file   Years of education: Not on file   Highest education level: Not on file  Occupational History   Not on file  Tobacco Use   Smoking status: Never   Smokeless tobacco: Former    Types: Chew, Snuff  Vaping Use   Vaping Use: Never used  Substance and Sexual Activity  Alcohol use: Yes    Alcohol/week: 2.0 standard drinks of alcohol    Types: 2 Glasses of wine per week   Drug use: No   Sexual activity: Yes  Other Topics Concern   Not on file  Social History Narrative   Not on file   Social Determinants of Health   Financial Resource Strain: Not on file  Food Insecurity: Not on file  Transportation Needs: Not on file  Physical Activity: Not on file  Stress: Not on file   Social Connections: Not on file  Intimate Partner Violence: Not on file    FAMILY HISTORY: Family History  Problem Relation Age of Onset   Rheum arthritis Mother    Other Sister        Pre-cancerous uterine mass;    Rheum arthritis Sister    Bone cancer Paternal Grandfather    Brain cancer Maternal Uncle    Brain cancer Maternal Aunt    Osteoporosis Neg Hx     ALLERGIES:  has No Known Allergies.  MEDICATIONS:  Current Outpatient Medications  Medication Sig Dispense Refill   aluminum chloride (DRYSOL) 20 % external solution Apply 1 application topically at bedtime.     apixaban (ELIQUIS) 5 MG TABS tablet Take 1 tablet (5 mg total) by mouth 2 (two) times daily. 60 tablet 5   calcium-vitamin D (OSCAL WITH D) 250-125 MG-UNIT tablet Take 1 tablet by mouth daily.     cyclobenzaprine (FLEXERIL) 10 MG tablet Take 10 mg by mouth 3 (three) times daily as needed for muscle spasms.     diclofenac sodium (VOLTAREN) 1 % GEL Apply 2 g topically as needed.     dronabinol (MARINOL) 5 MG capsule Take 1 capsule (5 mg total) by mouth 2 (two) times daily before a meal. 60 capsule 0   ergocalciferol (VITAMIN D2) 1.25 MG (50000 UT) capsule Take 1 capsule (50,000 Units total) by mouth 2 (two) times daily. 12 capsule 2   esomeprazole (NEXIUM) 40 MG capsule Take 1 capsule (40 mg total) by mouth daily before breakfast. 30 capsule 1   fentaNYL (DURAGESIC) 25 MCG/HR Place 1 patch onto the skin every 3 (three) days. 10 patch 0   furosemide (LASIX) 20 MG tablet Take 1 tablet (20 mg total) by mouth daily. (Patient not taking: Reported on 04/23/2022) 30 tablet 1   lidocaine (LIDODERM) 5 % Place 1 patch onto the skin daily. Remove & Discard patch within 12 hours or as directed by MD 30 patch 0   methylPREDNISolone (MEDROL DOSEPAK) 4 MG TBPK tablet Take 6 pills by mouth day 1, 5 on day 2, 4 on day 3, 3 on day 4, 2 on day 5, 1 on day 6 21 tablet 0   Multiple Vitamin (MULTIVITAMIN) tablet Take 1 tablet by mouth daily.      Omega-3 Fatty Acids (FISH OIL) 1360 MG CAPS Take 1 capsule by mouth daily.     ondansetron (ZOFRAN) 8 MG tablet Take 1 tablet (8 mg total) by mouth every 8 (eight) hours as needed for nausea or vomiting. (Patient not taking: Reported on 04/23/2022) 30 tablet 0   Oxycodone HCl 10 MG TABS Take 1/2 TO 1 tablet (5-10 mg total) by mouth every 4 (four) hours as needed for severe pain or moderate pain (cancer related pain). 30 tablet 0   REVLIMID 15 MG capsule TAKE 1 CAPSULE DAILY FOR 14 DAYS ON AND 7 DAYS OFF 14 capsule 0   senna-docusate (SENNA S) 8.6-50 MG tablet Take 2  tablets by mouth at bedtime. 60 tablet 1   terbinafine (LAMISIL) 1 % cream Apply 1 application topically 2 (two) times daily.     No current facility-administered medications for this visit.    10 Point review of Systems was done is negative except as noted above.  PHYSICAL EXAMINATION: .BP 130/83 (BP Location: Left Arm, Patient Position: Sitting)   Pulse 85   Temp (!) 97.5 F (36.4 C)   Resp 18   Ht _0  (1.702 m)   Wt 192 lb 1.6 oz (87.1 kg)   SpO2 97%   BMI 30.09 kg/m   NAD GENERAL:alert, in no acute distress and comfortable SKIN: no acute rashes, no significant lesions EYES: conjunctiva are pink and non-injected, sclera anicteric OROPHARYNX: MMM, no exudates, no oropharyngeal erythema or ulceration NECK: supple, no JVD LYMPH:  no palpable lymphadenopathy in the cervical, axillary or inguinal regions LUNGS: clear to auscultation b/l with normal respiratory effort HEART: regular rate & rhythm ABDOMEN:  normoactive bowel sounds , non tender, not distended. Extremity: no pedal edema PSYCH: alert & oriented x 3 with fluent speech NEURO: no focal motor/sensory deficits   LABORATORY DATA:  I have reviewed the data as listed .    Latest Ref Rng & Units 04/29/2022    1:58 PM 04/24/2022    8:15 AM 04/15/2022    3:20 PM  CBC  WBC 4.0 - 10.5 K/uL 5.0  3.2  4.3   Hemoglobin 13.0 - 17.0 g/dL 14.0  13.7  13.9    Hematocrit 39.0 - 52.0 % 41.7  40.7  40.9   Platelets 150 - 400 K/uL 153  134  159       Latest Ref Rng & Units 04/24/2022    8:15 AM 04/15/2022    3:20 PM 04/08/2022    7:46 AM  CMP  Glucose 70 - 99 mg/dL 85  93  106   BUN 8 - 23 mg/dL _1 Creatinine 0.61 - 1.24 mg/dL 0.80  0.59  0.81   Sodium 135 - 145 mmol/L 136  134  135   Potassium 3.5 - 5.1 mmol/L 4.3  4.1  3.9   Chloride 98 - 111 mmol/L 106  104  105   CO2 22 - 32 mmol/L _2 Calcium 8.9 - 10.3 mg/dL 8.7  9.1  8.4   Total Protein 6.5 - 8.1 g/dL 6.1  6.4  6.3   Total Bilirubin 0.3 - 1.2 mg/dL 0.6  0.5  0.5   Alkaline Phos 38 - 126 U/L 77  97  104   AST 15 - 41 U/L _3 ALT 0 - 44 U/L _4 02/06/2022 Molecular pathology   RADIOGRAPHIC STUDIES: I have personally reviewed the radiological images as listed and agreed with the findings in the report. IR Radiologist Eval & Mgmt  Result Date: 04/23/2022 EXAM: NEW PATIENT OFFICE VISIT CHIEF COMPLAINT: SEE EPIC NOTE HISTORY OF PRESENT ILLNESS: SEE EPIC NOTE REVIEW OF SYSTEMS: SEE EPIC NOTE PHYSICAL EXAMINATION: SEE EPIC NOTE ASSESSMENT AND PLAN: SEE EPIC NOTE Electronically Signed   By: Jacqulynn Cadet M.D.   On: 04/23/2022 12:49     ASSESSMENT & PLAN:   61 y.o. very pleasant male with  1. Newly diagnosed multiple myeloma with extensive bone metastases and patholgiic fracture rt shoulder -Recent bone marrow biopsy and aspiration done 02/06/2022 revealed hypercellular bone marrow  with plasma cell neoplasm.  Plan -Labs done today were discussed in detail with the patient -No significant new toxicities from dose escalation of carfilzomib to 70 mg per metered squared  -Continue daratumumab/carfilzomib/Revlimid/dexamethasone regimen. -Revlimid is currently at 15 mg --Continue current pain management with fentanyl patch and as needed oxycodone -Continue to optimize bowel regimen -Continue monthly Zometa -Continue a Eliquis for VTE  prophylaxis -Continue acyclovir for VZV prophylaxis  Follow up: As per appointment orders   The total time spent in the appointment was 30 minutes*.  All of the patient's questions were answered with apparent satisfaction. The patient knows to call the clinic with any problems, questions or concerns.   Sullivan Lone MD MS AAHIVMS [facility] Hematology/Oncology Physician [facility]  .*Total Encounter Time as defined by the Centers for Medicare and Medicaid Services includes, in addition to the face-to-face time of a patient visit (documented in the note above) non-face-to-face time: obtaining and reviewing outside history, ordering and reviewing medications, tests or procedures, care coordination (communications with other health care professionals or caregivers) and documentation in the medical record. myeloma

## 2022-05-07 ENCOUNTER — Other Ambulatory Visit: Payer: Self-pay | Admitting: Hematology

## 2022-05-07 ENCOUNTER — Inpatient Hospital Stay: Payer: BC Managed Care – PPO

## 2022-05-07 ENCOUNTER — Other Ambulatory Visit (HOSPITAL_COMMUNITY)
Admission: RE | Admit: 2022-05-07 | Discharge: 2022-05-07 | Disposition: A | Discharge status: Home / Self Care | Payer: BC Managed Care – PPO | Source: Ambulatory Visit | Attending: Interventional Radiology | Admitting: Interventional Radiology

## 2022-05-07 ENCOUNTER — Ambulatory Visit
Admission: RE | Admit: 2022-05-07 | Discharge: 2022-05-07 | Disposition: A | Discharge status: Home / Self Care | Payer: BC Managed Care – PPO | Source: Ambulatory Visit | Attending: Hematology | Admitting: Hematology

## 2022-05-07 ENCOUNTER — Encounter: Payer: Self-pay | Admitting: Hematology

## 2022-05-07 VITALS — BP 133/88 | HR 80 | Temp 98.6°F | Resp 12

## 2022-05-07 DIAGNOSIS — C9000 Multiple myeloma not having achieved remission: Secondary | ICD-10-CM

## 2022-05-07 DIAGNOSIS — M8458XA Pathological fracture in neoplastic disease, other specified site, initial encounter for fracture: Secondary | ICD-10-CM | POA: Diagnosis not present

## 2022-05-07 HISTORY — PX: IR BONE TUMOR(S)RF ABLATION: IMG2284

## 2022-05-07 HISTORY — PX: IR KYPHO THORACIC WITH BONE BIOPSY: IMG5518

## 2022-05-07 HISTORY — PX: IR KYPHO LUMBAR INC FX REDUCE BONE BX UNI/BIL CANNULATION INC/IMAGING: IMG5519

## 2022-05-07 HISTORY — PX: IR KYPHO EA ADDL LEVEL THORACIC OR LUMBAR: IMG5520

## 2022-05-07 MED ORDER — FENTANYL CITRATE PF 50 MCG/ML IJ SOSY
25.0000 ug | PREFILLED_SYRINGE | INTRAMUSCULAR | Status: DC | PRN
  Administered 2022-05-07: 25 ug via INTRAVENOUS
  Administered 2022-05-07: 50 ug via INTRAVENOUS
  Administered 2022-05-07: 25 ug via INTRAVENOUS
  Administered 2022-05-07 (×4): 50 ug via INTRAVENOUS

## 2022-05-07 MED ORDER — MIDAZOLAM HCL 2 MG/2ML IJ SOLN
1.0000 mg | INTRAMUSCULAR | Status: DC | PRN
  Administered 2022-05-07 (×4): 1 mg via INTRAVENOUS
  Administered 2022-05-07: 2 mg via INTRAVENOUS
  Administered 2022-05-07 (×2): 1 mg via INTRAVENOUS

## 2022-05-07 MED ORDER — KETOROLAC TROMETHAMINE 30 MG/ML IJ SOLN
30.0000 mg | Freq: Once | INTRAMUSCULAR | Status: AC
  Administered 2022-05-07: 30 mg via INTRAVENOUS

## 2022-05-07 MED ORDER — CEFAZOLIN SODIUM-DEXTROSE 2-4 GM/100ML-% IV SOLN
2.0000 g | INTRAVENOUS | Status: AC
  Administered 2022-05-07: 2 g via INTRAVENOUS

## 2022-05-07 MED ORDER — SODIUM CHLORIDE 0.9 % IV SOLN: INTRAVENOUS | Status: DC

## 2022-05-07 MED FILL — Dexamethasone Sodium Phosphate Inj 100 MG/10ML: INTRAMUSCULAR | Qty: 2 | Status: AC

## 2022-05-07 NOTE — Discharge Instructions (Signed)
Kyphoplasty Post Procedure Discharge Instructions  May resume a regular diet and any medications that you routinely take (including pain medications). However, if you are taking Aspirin or an anticoagulant/blood thinner you will be told when you can resume taking these by the healthcare provider. No driving day of procedure. The day of your procedure take it easy. You may use an ice pack as needed to injection sites on back.  Ice to back 30 minutes on and 30 minutes off, as needed. May remove bandaids tomorrow after taking a shower. Replace daily with a clean bandaid until healed.  Do not lift anything heavier than a milk jug for 1-2 weeks or determined by your physician.  Follow up with your physician in 2 weeks.    Please contact our office at [phone] for the following symptoms or if you have any questions:  Fever greater than 100 degrees Increased swelling, pain, or redness at injection site. Increased back and/or leg pain New numbness or change in symptoms from before the procedure.    Thank you for visiting [facility].   You may restart your eliquis 24 hours after procedure!

## 2022-05-07 NOTE — Progress Notes (Signed)
[place] [facility]  Initial Assessment   Anthony [name] is a 60 y.o. year old male accompanied by patient and spouse. Clinical Social Work was referred by nurse for assessment of psychosocial needs.   SDOH (Social Determinants of Health) assessments performed: Yes SDOH Interventions    Flowsheet Row Clinical Support from 05/07/2022 in [facility]  SDOH Interventions   Food Insecurity Interventions Intervention Not Indicated  Housing Interventions Intervention Not Indicated  Transportation Interventions Intervention Not Indicated  Utilities Interventions Intervention Not Indicated  Financial Strain Interventions Financial Counselor       SDOH Screenings   Food Insecurity: No Food Insecurity (05/07/2022)  Housing: Low Risk  (05/07/2022)  Transportation Needs: No Transportation Needs (05/07/2022)  Utilities: Not At Risk (05/07/2022)  Financial Resource Strain: Low Risk  (05/07/2022)  Tobacco Use: Medium Risk (05/07/2022)     Distress Screen completed: No     No data to display            Family/Social Information:  Housing Arrangement:  his wife, Anthony [name]. Family members/support persons in your life? Family Transportation concerns: no  Employment: Disabled He used to work full-time for [employer].  He will soon transition to Long-Term Disability..  Income source: Short-Term Disability Financial concerns: Yes, due to illness and/or loss of work during treatment Type of concern: Medical bills Food access concerns: no Religious or spiritual practice: Not known Services Currently in place:  BCBS  Coping/ Adjustment to diagnosis: Patient understands treatment plan and what happens next? 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 Patient enjoys time with family/ friends Current  coping skills/ strengths: Average or above average intelligence , Capable of independent living , Communication skills , General fund of knowledge , Motivation for treatment/growth , and Supportive family/friends     SUMMARY: Current SDOH Barriers:  Financial constraints related to inability to work.  Clinical Social Work Clinical Goal(s):  Explore community resource options for unmet needs related to:  Financial Strain   Interventions: Discussed common feeling and emotions when being diagnosed with cancer, and the importance of support during treatment Informed patient of the support team roles and support services at [facility] Provided [place] contact information and encouraged patient to call with any questions or concerns Provided patient with information about financial resources and obtained release of information to begin disability application to be sent to the [facility] for processing.   Follow Up Plan: Patient will contact CSW with any support or resource needs Patient verbalizes understanding of plan: Yes    Rodman Pickle Kati Riggenbach, LCSW

## 2022-05-07 NOTE — Progress Notes (Signed)
Pt back in nursing recovery area. Pt still drowsy from procedure but will wake up when spoken to. Pt follows commands, talks in complete sentences and has no complaints at this time. Pt will remain in nursing station until discharge.  ?

## 2022-05-08 ENCOUNTER — Inpatient Hospital Stay: Payer: BC Managed Care – PPO

## 2022-05-08 ENCOUNTER — Inpatient Hospital Stay (HOSPITAL_BASED_OUTPATIENT_CLINIC_OR_DEPARTMENT_OTHER): Payer: BC Managed Care – PPO | Admitting: Hematology

## 2022-05-08 VITALS — BP 129/77 | HR 86 | Temp 98.0°F | Resp 18 | Wt 193.8 lb

## 2022-05-08 DIAGNOSIS — Z7189 Other specified counseling: Secondary | ICD-10-CM

## 2022-05-08 DIAGNOSIS — M545 Low back pain, unspecified: Secondary | ICD-10-CM | POA: Diagnosis not present

## 2022-05-08 DIAGNOSIS — C9000 Multiple myeloma not having achieved remission: Secondary | ICD-10-CM

## 2022-05-08 DIAGNOSIS — Z7961 Long term (current) use of immunomodulator: Secondary | ICD-10-CM | POA: Diagnosis not present

## 2022-05-08 DIAGNOSIS — G8929 Other chronic pain: Secondary | ICD-10-CM | POA: Diagnosis not present

## 2022-05-08 DIAGNOSIS — R059 Cough, unspecified: Secondary | ICD-10-CM | POA: Diagnosis not present

## 2022-05-08 DIAGNOSIS — Z5112 Encounter for antineoplastic immunotherapy: Secondary | ICD-10-CM | POA: Diagnosis not present

## 2022-05-08 DIAGNOSIS — Z79891 Long term (current) use of opiate analgesic: Secondary | ICD-10-CM | POA: Diagnosis not present

## 2022-05-08 LAB — CMP (CANCER CENTER ONLY)
ALT: 14 U/L (ref 0–44)
AST: 14 U/L — ABNORMAL LOW (ref 15–41)
Albumin: 3.8 g/dL (ref 3.5–5.0)
Alkaline Phosphatase: 75 U/L (ref 38–126)
Anion gap: 6 (ref 5–15)
BUN: 14 mg/dL (ref 8–23)
CO2: 23 mmol/L (ref 22–32)
Calcium: 8.2 mg/dL — ABNORMAL LOW (ref 8.9–10.3)
Chloride: 108 mmol/L (ref 98–111)
Creatinine: 0.78 mg/dL (ref 0.61–1.24)
GFR, Estimated: >60 mL/min (ref >60)
Glucose, Bld: 126 mg/dL — ABNORMAL HIGH (ref 70–99)
Potassium: 4 mmol/L (ref 3.5–5.1)
Sodium: 137 mmol/L (ref 135–145)
Total Bilirubin: 0.7 mg/dL (ref 0.3–1.2)
Total Protein: 5.8 g/dL — ABNORMAL LOW (ref 6.5–8.1)

## 2022-05-08 LAB — CBC WITH DIFFERENTIAL (CANCER CENTER ONLY)
Abs Immature Granulocytes: 0.01 10*3/uL (ref 0.00–0.07)
Basophils Absolute: 0 10*3/uL (ref 0.0–0.1)
Basophils Relative: 0 %
Eosinophils Absolute: 0.2 10*3/uL (ref 0.0–0.5)
Eosinophils Relative: 4 %
HCT: 42.1 % (ref 39.0–52.0)
Hemoglobin: 14 g/dL (ref 13.0–17.0)
Immature Granulocytes: 0 %
Lymphocytes Relative: 16 %
Lymphs Abs: 0.9 10*3/uL (ref 0.7–4.0)
MCH: 27.9 pg (ref 26.0–34.0)
MCHC: 33.3 g/dL (ref 30.0–36.0)
MCV: 84 fL (ref 80.0–100.0)
Monocytes Absolute: 0.9 10*3/uL (ref 0.1–1.0)
Monocytes Relative: 16 %
Neutro Abs: 3.5 10*3/uL (ref 1.7–7.7)
Neutrophils Relative %: 64 %
Platelet Count: 189 10*3/uL (ref 150–400)
RBC: 5.01 MIL/uL (ref 4.22–5.81)
RDW: 14.7 % (ref 11.5–15.5)
WBC Count: 5.5 10*3/uL (ref 4.0–10.5)
nRBC: 0 % (ref 0.0–0.2)

## 2022-05-08 MED ORDER — HEPARIN SOD (PORK) LOCK FLUSH 100 UNIT/ML IV SOLN: 250.0000 [IU] | Freq: Once | INTRAVENOUS | Status: DC | PRN

## 2022-05-08 MED ORDER — SODIUM CHLORIDE 0.9 % IV SOLN
16.0000 mg/kg | Freq: Once | INTRAVENOUS | Status: AC
  Administered 2022-05-08: 1400 mg via INTRAVENOUS
  Filled 2022-05-08: qty 60

## 2022-05-08 MED ORDER — SODIUM CHLORIDE 0.9 % IV SOLN: Freq: Once | INTRAVENOUS | Status: AC

## 2022-05-08 MED ORDER — DEXTROSE 5 % IV SOLN
70.0000 mg/m2 | Freq: Once | INTRAVENOUS | Status: AC
  Administered 2022-05-08: 140 mg via INTRAVENOUS
  Filled 2022-05-08: qty 60

## 2022-05-08 MED ORDER — ZOLEDRONIC ACID 4 MG/100ML IV SOLN
4.0000 mg | Freq: Once | INTRAVENOUS | Status: AC
  Administered 2022-05-08: 4 mg via INTRAVENOUS
  Filled 2022-05-08: qty 100

## 2022-05-08 MED ORDER — ACETAMINOPHEN 325 MG PO TABS
650.0000 mg | ORAL_TABLET | Freq: Once | ORAL | Status: AC
  Administered 2022-05-08: 650 mg via ORAL
  Filled 2022-05-08: qty 2

## 2022-05-08 MED ORDER — FAMOTIDINE IN NACL 20-0.9 MG/50ML-% IV SOLN
20.0000 mg | Freq: Once | INTRAVENOUS | Status: AC
  Administered 2022-05-08: 20 mg via INTRAVENOUS
  Filled 2022-05-08: qty 50

## 2022-05-08 MED ORDER — SODIUM CHLORIDE 0.9 % IV SOLN: Freq: Once | INTRAVENOUS | Status: DC

## 2022-05-08 MED ORDER — SODIUM CHLORIDE 0.9% FLUSH: 3.0000 mL | Freq: Once | INTRAVENOUS | Status: DC | PRN

## 2022-05-08 MED ORDER — SODIUM CHLORIDE 0.9 % IV SOLN
20.0000 mg | Freq: Once | INTRAVENOUS | Status: AC
  Administered 2022-05-08: 20 mg via INTRAVENOUS
  Filled 2022-05-08: qty 20

## 2022-05-08 MED ORDER — HEPARIN SOD (PORK) LOCK FLUSH 100 UNIT/ML IV SOLN: 500.0000 [IU] | Freq: Once | INTRAVENOUS | Status: DC | PRN

## 2022-05-08 MED ORDER — SODIUM CHLORIDE 0.9% FLUSH: 10.0000 mL | Freq: Once | INTRAVENOUS | Status: DC | PRN

## 2022-05-08 MED ORDER — ALTEPLASE 2 MG IJ SOLR: 2.0000 mg | Freq: Once | INTRAMUSCULAR | Status: DC | PRN

## 2022-05-08 MED ORDER — DIPHENHYDRAMINE HCL 25 MG PO CAPS
50.0000 mg | ORAL_CAPSULE | Freq: Once | ORAL | Status: AC
  Administered 2022-05-08: 50 mg via ORAL
  Filled 2022-05-08: qty 2

## 2022-05-08 NOTE — Patient Instructions (Signed)
[facility]  Discharge Instructions: Thank you for choosing [facility] to provide your oncology and hematology care.   If you have a lab appointment with the [facility], please go directly to the [facility] and check in at the registration area.   Wear comfortable clothing and clothing appropriate for easy access to any Portacath or PICC line.   We strive to give you quality time with your provider. You may need to reschedule your appointment if you arrive late (15 or more minutes).  Arriving late affects you and other patients whose appointments are after yours.  Also, if you miss three or more appointments without notifying the office, you may be dismissed from the clinic at the provider's discretion.      For prescription refill requests, have your pharmacy contact our office and allow 72 hours for refills to be completed.    Today you received the following chemotherapy and/or immunotherapy agents: Daratumumab.       To help prevent nausea and vomiting after your treatment, we encourage you to take your nausea medication as directed.  BELOW ARE SYMPTOMS THAT SHOULD BE REPORTED IMMEDIATELY: *FEVER GREATER THAN 100.4 F (38 C) OR HIGHER *CHILLS OR SWEATING *NAUSEA AND VOMITING THAT IS NOT CONTROLLED WITH YOUR NAUSEA MEDICATION *UNUSUAL SHORTNESS OF BREATH *UNUSUAL BRUISING OR BLEEDING *URINARY PROBLEMS (pain or burning when urinating, or frequent urination) *BOWEL PROBLEMS (unusual diarrhea, constipation, pain near the anus) TENDERNESS IN MOUTH AND THROAT WITH OR WITHOUT PRESENCE OF ULCERS (sore throat, sores in mouth, or a toothache) UNUSUAL RASH, SWELLING OR PAIN  UNUSUAL VAGINAL DISCHARGE OR ITCHING   Items with * indicate a potential emergency and should be followed up as soon as possible or go to the Emergency Department if any problems should occur.  Please show the CHEMOTHERAPY ALERT CARD or IMMUNOTHERAPY ALERT CARD at check-in  to the Emergency Department and triage nurse.  Should you have questions after your visit or need to cancel or reschedule your appointment, please contact [facility]  Dept: [phone]  and follow the prompts.  Office hours are 8:00 a.m. to 4:30 p.m. Monday - Friday. Please note that voicemails left after 4:00 p.m. may not be returned until the following business day.  We are closed weekends and major holidays. You have access to a nurse at all times for urgent questions. Please call the main number to the clinic Dept: [phone] and follow the prompts.   For any non-urgent questions, you may also contact your provider using MyChart. We now offer e-Visits for anyone 72 and older to request care online for non-urgent symptoms. For details visit [email or web address].   Also download the MyChart app! Go to the app store, search "MyChart", open the app, select [facility], and log in with your MyChart username and password.  Masks are optional in the cancer centers. If you would like for your care team to wear a mask while they are taking care of you, please let them know. You may have one support person who is at least 62 years old accompany you for your appointments. Zoledronic Acid Injection (Cancer) What is this medication? ZOLEDRONIC ACID (ZOE le dron ik AS id) treats high calcium levels in the blood caused by cancer. It may also be used with chemotherapy to treat weakened bones caused by cancer. It works by slowing down the release of calcium from bones. This lowers calcium levels in your blood. It also makes  your bones stronger and less likely to break (fracture). It belongs to a group of medications called bisphosphonates. This medicine may be used for other purposes; ask your health care provider or pharmacist if you have questions. COMMON BRAND NAME(S): Zometa, Zometa Powder What should I tell my care team before I take this medication? They need to know if  you have any of these conditions: Dehydration Dental disease Kidney disease Liver disease Low levels of calcium in the blood Lung or breathing disease, such as asthma Receiving steroids, such as dexamethasone or prednisone An unusual or allergic reaction to zoledronic acid, other medications, foods, dyes, or preservatives Pregnant or trying to get pregnant Breast-feeding How should I use this medication? This medication is injected into a vein. It is given by your care team in a hospital or clinic setting. Talk to your care team about the use of this medication in children. Special care may be needed. Overdosage: If you think you have taken too much of this medicine contact a poison control center or emergency room at once. NOTE: This medicine is only for you. Do not share this medicine with others. What if I miss a dose? Keep appointments for follow-up doses. It is important not to miss your dose. Call your care team if you are unable to keep an appointment. What may interact with this medication? Certain antibiotics given by injection Diuretics, such as bumetanide, furosemide NSAIDs, medications for pain and inflammation, such as ibuprofen or naproxen Teriparatide Thalidomide This list may not describe all possible interactions. Give your health care provider a list of all the medicines, herbs, non-prescription drugs, or dietary supplements you use. Also tell them if you smoke, drink alcohol, or use illegal drugs. Some items may interact with your medicine. What should I watch for while using this medication? Visit your care team for regular checks on your progress. It may be some time before you see the benefit from this medication. Some people who take this medication have severe bone, joint, or muscle pain. This medication may also increase your risk for jaw problems or a broken thigh bone. Tell your care team right away if you have severe pain in your jaw, bones, joints, or muscles.  Tell you care team if you have any pain that does not go away or that gets worse. Tell your dentist and dental surgeon that you are taking this medication. You should not have major dental surgery while on this medication. See your dentist to have a dental exam and fix any dental problems before starting this medication. Take good care of your teeth while on this medication. Make sure you see your dentist for regular follow-up appointments. You should make sure you get enough calcium and vitamin D while you are taking this medication. Discuss the foods you eat and the vitamins you take with your care team. Check with your care team if you have severe diarrhea, nausea, and vomiting, or if you sweat a lot. The loss of too much body fluid may make it dangerous for you to take this medication. You may need bloodwork while taking this medication. Talk to your care team if you wish to become pregnant or think you might be pregnant. This medication can cause serious birth defects. What side effects may I notice from receiving this medication? Side effects that you should report to your care team as soon as possible: Allergic reactions--skin rash, itching, hives, swelling of the face, lips, tongue, or throat Kidney injury--decrease in the amount  of urine, swelling of the ankles, hands, or feet Low calcium level--muscle pain or cramps, confusion, tingling, or numbness in the hands or feet Osteonecrosis of the jaw--pain, swelling, or redness in the mouth, numbness of the jaw, poor healing after dental work, unusual discharge from the mouth, visible bones in the mouth Severe bone, joint, or muscle pain Side effects that usually do not require medical attention (report to your care team if they continue or are bothersome): Constipation Fatigue Fever Loss of appetite Nausea Stomach pain This list may not describe all possible side effects. Call your doctor for medical advice about side effects. You may report  side effects to FDA at 1-800-FDA-1088. Where should I keep my medication? This medication is given in a hospital or clinic. It will not be stored at home. NOTE: This sheet is a summary. It may not cover all possible information. If you have questions about this medicine, talk to your doctor, pharmacist, or health care provider.  2023 Elsevier/Gold Standard (2021-09-20 00:00:00)

## 2022-05-08 NOTE — Progress Notes (Signed)
Per Irene Limbo MD, ok to give Zometa with Calcium 8.2 today.

## 2022-05-09 ENCOUNTER — Telehealth: Payer: Self-pay | Admitting: Hematology

## 2022-05-09 NOTE — Telephone Encounter (Signed)
Scheduled follow-up appointments per appointment request workqueue. Patient is aware. 

## 2022-05-10 LAB — SURGICAL PATHOLOGY

## 2022-05-13 LAB — MULTIPLE MYELOMA PANEL, SERUM
Albumin SerPl Elph-Mcnc: 3.4 g/dL (ref 2.9–4.4)
Albumin/Glob SerPl: 1.7 (ref 0.7–1.7)
Alpha 1: 0.2 g/dL (ref 0.0–0.4)
Alpha2 Glob SerPl Elph-Mcnc: 0.6 g/dL (ref 0.4–1.0)
B-Globulin SerPl Elph-Mcnc: 0.8 g/dL (ref 0.7–1.3)
Gamma Glob SerPl Elph-Mcnc: 0.5 g/dL (ref 0.4–1.8)
Globulin, Total: 2.1 g/dL — ABNORMAL LOW (ref 2.2–3.9)
IgA: 114 mg/dL (ref 61–437)
IgG (Immunoglobin G), Serum: 503 mg/dL — ABNORMAL LOW (ref 603–1613)
IgM (Immunoglobulin M), Srm: 20 mg/dL (ref 20–172)
M Protein SerPl Elph-Mcnc: 0.2 g/dL — ABNORMAL HIGH
Total Protein ELP: 5.5 g/dL — ABNORMAL LOW (ref 6.0–8.5)

## 2022-05-13 MED FILL — Dexamethasone Sodium Phosphate Inj 100 MG/10ML: INTRAMUSCULAR | Qty: 2 | Status: AC

## 2022-05-14 ENCOUNTER — Other Ambulatory Visit: Payer: Self-pay

## 2022-05-14 ENCOUNTER — Other Ambulatory Visit: Payer: Self-pay | Admitting: Hematology

## 2022-05-14 ENCOUNTER — Inpatient Hospital Stay: Payer: BC Managed Care – PPO

## 2022-05-14 VITALS — BP 132/72 | HR 77 | Temp 98.0°F | Resp 18

## 2022-05-14 DIAGNOSIS — C9000 Multiple myeloma not having achieved remission: Secondary | ICD-10-CM

## 2022-05-14 DIAGNOSIS — R059 Cough, unspecified: Secondary | ICD-10-CM | POA: Diagnosis not present

## 2022-05-14 DIAGNOSIS — G8929 Other chronic pain: Secondary | ICD-10-CM | POA: Diagnosis not present

## 2022-05-14 DIAGNOSIS — Z79891 Long term (current) use of opiate analgesic: Secondary | ICD-10-CM | POA: Diagnosis not present

## 2022-05-14 DIAGNOSIS — Z7189 Other specified counseling: Secondary | ICD-10-CM

## 2022-05-14 DIAGNOSIS — Z7961 Long term (current) use of immunomodulator: Secondary | ICD-10-CM | POA: Diagnosis not present

## 2022-05-14 DIAGNOSIS — Z5112 Encounter for antineoplastic immunotherapy: Secondary | ICD-10-CM | POA: Diagnosis not present

## 2022-05-14 DIAGNOSIS — M545 Low back pain, unspecified: Secondary | ICD-10-CM | POA: Diagnosis not present

## 2022-05-14 LAB — CMP (CANCER CENTER ONLY)
ALT: 12 U/L (ref 0–44)
AST: 11 U/L — ABNORMAL LOW (ref 15–41)
Albumin: 3.7 g/dL (ref 3.5–5.0)
Alkaline Phosphatase: 59 U/L (ref 38–126)
Anion gap: 5 (ref 5–15)
BUN: 11 mg/dL (ref 8–23)
CO2: 25 mmol/L (ref 22–32)
Calcium: 8.2 mg/dL — ABNORMAL LOW (ref 8.9–10.3)
Chloride: 105 mmol/L (ref 98–111)
Creatinine: 0.75 mg/dL (ref 0.61–1.24)
GFR, Estimated: >60 mL/min (ref >60)
Glucose, Bld: 127 mg/dL — ABNORMAL HIGH (ref 70–99)
Potassium: 4.5 mmol/L (ref 3.5–5.1)
Sodium: 135 mmol/L (ref 135–145)
Total Bilirubin: 0.7 mg/dL (ref 0.3–1.2)
Total Protein: 6 g/dL — ABNORMAL LOW (ref 6.5–8.1)

## 2022-05-14 LAB — CBC WITH DIFFERENTIAL (CANCER CENTER ONLY)
Abs Immature Granulocytes: 0.01 10*3/uL (ref 0.00–0.07)
Basophils Absolute: 0 10*3/uL (ref 0.0–0.1)
Basophils Relative: 0 %
Eosinophils Absolute: 0.5 10*3/uL (ref 0.0–0.5)
Eosinophils Relative: 12 %
HCT: 40.5 % (ref 39.0–52.0)
Hemoglobin: 13.7 g/dL (ref 13.0–17.0)
Immature Granulocytes: 0 %
Lymphocytes Relative: 23 %
Lymphs Abs: 0.9 10*3/uL (ref 0.7–4.0)
MCH: 27.9 pg (ref 26.0–34.0)
MCHC: 33.8 g/dL (ref 30.0–36.0)
MCV: 82.5 fL (ref 80.0–100.0)
Monocytes Absolute: 0.3 10*3/uL (ref 0.1–1.0)
Monocytes Relative: 9 %
Neutro Abs: 2.1 10*3/uL (ref 1.7–7.7)
Neutrophils Relative %: 56 %
Platelet Count: 119 10*3/uL — ABNORMAL LOW (ref 150–400)
RBC: 4.91 MIL/uL (ref 4.22–5.81)
RDW: 14.8 % (ref 11.5–15.5)
WBC Count: 3.8 10*3/uL — ABNORMAL LOW (ref 4.0–10.5)
nRBC: 0 % (ref 0.0–0.2)

## 2022-05-14 MED ORDER — FAMOTIDINE IN NACL 20-0.9 MG/50ML-% IV SOLN
20.0000 mg | Freq: Once | INTRAVENOUS | Status: AC
  Administered 2022-05-14: 20 mg via INTRAVENOUS
  Filled 2022-05-14: qty 50

## 2022-05-14 MED ORDER — SODIUM CHLORIDE 0.9 % IV SOLN
20.0000 mg | Freq: Once | INTRAVENOUS | Status: AC
  Administered 2022-05-14: 20 mg via INTRAVENOUS
  Filled 2022-05-14: qty 20

## 2022-05-14 MED ORDER — SODIUM CHLORIDE 0.9 % IV SOLN
16.0000 mg/kg | Freq: Once | INTRAVENOUS | Status: AC
  Administered 2022-05-14: 1400 mg via INTRAVENOUS
  Filled 2022-05-14: qty 60

## 2022-05-14 MED ORDER — SODIUM CHLORIDE 0.9 % IV SOLN: Freq: Once | INTRAVENOUS | Status: AC

## 2022-05-14 MED ORDER — DIPHENHYDRAMINE HCL 25 MG PO CAPS
50.0000 mg | ORAL_CAPSULE | Freq: Once | ORAL | Status: AC
  Administered 2022-05-14: 50 mg via ORAL
  Filled 2022-05-14: qty 2

## 2022-05-14 MED ORDER — ACETAMINOPHEN 325 MG PO TABS
650.0000 mg | ORAL_TABLET | Freq: Once | ORAL | Status: AC
  Administered 2022-05-14: 650 mg via ORAL
  Filled 2022-05-14: qty 2

## 2022-05-14 MED ORDER — DEXTROSE 5 % IV SOLN
70.0000 mg/m2 | Freq: Once | INTRAVENOUS | Status: AC
  Administered 2022-05-14: 140 mg via INTRAVENOUS
  Filled 2022-05-14: qty 60

## 2022-05-14 NOTE — Patient Instructions (Signed)
[name]  Discharge Instructions: Thank you for choosing [name] to provide your oncology and hematology care.   If you have a lab appointment with the [name], please go directly to the [name] and check in at the registration area.   Wear comfortable clothing and clothing appropriate for easy access to any Portacath or PICC line.   We strive to give you quality time with your provider. You may need to reschedule your appointment if you arrive late (15 or more minutes).  Arriving late affects you and other patients whose appointments are after yours.  Also, if you miss three or more appointments without notifying the office, you may be dismissed from the clinic at the provider's discretion.      For prescription refill requests, have your pharmacy contact our office and allow 72 hours for refills to be completed.    Today you received the following chemotherapy and/or immunotherapy agents darzalex      To help prevent nausea and vomiting after your treatment, we encourage you to take your nausea medication as directed.  BELOW ARE SYMPTOMS THAT SHOULD BE REPORTED IMMEDIATELY: *FEVER GREATER THAN 100.4 F (38 C) OR HIGHER *CHILLS OR SWEATING *NAUSEA AND VOMITING THAT IS NOT CONTROLLED WITH YOUR NAUSEA MEDICATION *UNUSUAL SHORTNESS OF BREATH *UNUSUAL BRUISING OR BLEEDING *URINARY PROBLEMS (pain or burning when urinating, or frequent urination) *BOWEL PROBLEMS (unusual diarrhea, constipation, pain near the anus) TENDERNESS IN MOUTH AND THROAT WITH OR WITHOUT PRESENCE OF ULCERS (sore throat, sores in mouth, or a toothache) UNUSUAL RASH, SWELLING OR PAIN  UNUSUAL VAGINAL DISCHARGE OR ITCHING   Items with * indicate a potential emergency and should be followed up as soon as possible or go to the Emergency Department if any problems should occur.  Please show the CHEMOTHERAPY ALERT CARD or IMMUNOTHERAPY ALERT CARD at check-in to  the Emergency Department and triage nurse.  Should you have questions after your visit or need to cancel or reschedule your appointment, please contact [name]  Dept: [phone]  and follow the prompts.  Office hours are 8:00 a.m. to 4:30 p.m. Monday - Friday. Please note that voicemails left after 4:00 p.m. may not be returned until the following business day.  We are closed weekends and major holidays. You have access to a nurse at all times for urgent questions. Please call the main number to the clinic Dept: [phone] and follow the prompts.   For any non-urgent questions, you may also contact your provider using MyChart. We now offer e-Visits for anyone 18 and older to request care online for non-urgent symptoms. For details visit [email or web address].   Also download the MyChart app! Go to the app store, search "MyChart", open the app, select [facility], and log in with your MyChart username and password.  Masks are optional in the cancer centers. If you would like for your care team to wear a mask while they are taking care of you, please let them know. You may have one support person who is at least 62 years old accompany you for your appointments. 

## 2022-05-15 ENCOUNTER — Encounter: Payer: Self-pay | Admitting: Hematology

## 2022-05-15 MED ORDER — OXYCODONE HCL 10 MG PO TABS: ORAL_TABLET | ORAL | 0 refills | Status: DC

## 2022-05-15 NOTE — Telephone Encounter (Signed)
Duplicate

## 2022-05-15 NOTE — Progress Notes (Addendum)
HEMATOLOGY/ONCOLOGY CLINIC NOTE  Date of Service: .05/08/2022   Patient Care Team: Anthony [name], [place] as PCP - General (Nurse Practitioner)  CHIEF COMPLAINTS/PURPOSE OF CONSULTATION:  Continue valuation and management of multiple myeloma  HISTORY OF PRESENTING ILLNESS:   Anthony [name] is a wonderful 61 y.o. male who has been referred to [place] by Anthony Anthony Odor, PA for evaluation and management of newly diagnosed multiple myeloma. He reports He is doing well.  He reports persistent lower back pain that he has had since he was in his 20's. He notes no previous significant injuries. He further notes that he gets chiropractic adjustments and takes Tylenol to manage his symptoms. He notes less back pain when standing up on his right leg and maintaining weight on his right leg.  He reports previous history of smoking and chewing tobacco over 40 years ago.  He had a recent fall back in February of this year with only a pulled muscle. And he notes another fall when chasing his cat where he fell on his right shoulder. He reports pain in right shoulder.   He had a recent COVID-19 infection back in March.  He reports intermittent cough with SOB. He notes he takes 2 Rolaids at night. We discussed potentially trying antacids which he is agreeable to as he will begin steroids soon and was advised of the possible symptoms and side effects from taking steroids.  We discussed CRAB criteria and that he meets at least two of the criterion being  anemia and bone disease.  We discussed getting additional scans for further evaluation which he is agreeable to.  We further discussed starting Daratumumab/Velcade/Dexamethasone/Zometa and Revlimid for treatment which he is agreeable to. We also discussed starting steroids before treatment and taking an acid suppressant which he was also agreeable to.  We discussed getting Senna and taking it as needed for constipation.  Labs done today were reviewed in  detail.  We discussed his recent bone marrow biopsy and aspiration done 02/06/2022.  We discussed PET/CT scan done 01/28/2022.  We discussed CT right shoulder w/o contrast done 11/13/2021  INTERVAL HISTORY:  Anthony [name] is a 61 y.o. male is here for continued evaluation and management of his multiple myeloma and cycle 2 of daratumumab carfilzomib Revlimid dexamethasone treatment. He has tolerated dose escalation of carfilzomib to 70 mg per metered square without any new toxicities. No fevers no chills no night sweats.  He just had his kyphoplasty procedure on 05/07/2022 and is a little sore from this.  We discussed that hopefully his pain will continue to remain well controlled and improve. No fevers no chills no night sweats.   MEDICAL HISTORY:  Past Medical History:  Diagnosis Date   Back pain    Cancer ([place])    Multiple myeloma ([place]) 02/15/2022    SURGICAL HISTORY: Past Surgical History:  Procedure Laterality Date   IR BONE TUMOR(S)RF ABLATION  05/07/2022   IR BONE TUMOR(S)RF ABLATION  05/07/2022   IR BONE TUMOR(S)RF ABLATION  05/07/2022   IR KYPHO EA ADDL LEVEL THORACIC OR LUMBAR  05/07/2022   IR KYPHO LUMBAR INC FX REDUCE BONE BX UNI/BIL CANNULATION INC/IMAGING  05/07/2022   IR KYPHO THORACIC WITH BONE BIOPSY  05/07/2022   IR RADIOLOGIST EVAL & MGMT  04/23/2022    SOCIAL HISTORY: Social History   Socioeconomic History   Marital status: Married    Spouse name: Not on file   Number of children: Not on file   Years  of education: Not on file   Highest education level: Not on file  Occupational History   Not on file  Tobacco Use   Smoking status: Never   Smokeless tobacco: Former    Types: Chew, Snuff  Vaping Use   Vaping Use: Never used  Substance and Sexual Activity   Alcohol use: Yes    Alcohol/week: 2.0 standard drinks of alcohol    Types: 2 Glasses of wine per week   Drug use: No   Sexual activity: Yes  Other Topics Concern   Not on file  Social History  Narrative   Not on file   Social Determinants of Health   Financial Resource Strain: Low Risk  (05/07/2022)   Overall Financial Resource Strain (CARDIA)    Difficulty of Paying Living Expenses: Not very hard  Food Insecurity: No Food Insecurity (05/07/2022)   Hunger Vital Sign    Worried About Running Out of Food in the Last Year: Never true    Ran Out of Food in the Last Year: Never true  Transportation Needs: No Transportation Needs (05/07/2022)   PRAPARE - Hydrologist (Medical): No    Lack of Transportation (Non-Medical): No  Physical Activity: Not on file  Stress: Not on file  Social Connections: Not on file  Intimate Partner Violence: Unknown (05/07/2022)   Humiliation, Afraid, Rape, and Kick questionnaire    Fear of Current or Ex-Partner: No    Emotionally Abused: No    Physically Abused: No    Sexually Abused: Not on file    FAMILY HISTORY: Family History  Problem Relation Age of Onset   Rheum arthritis Mother    Other Sister        Pre-cancerous uterine mass;    Rheum arthritis Sister    Bone cancer Paternal Grandfather    Brain cancer Maternal Uncle    Brain cancer Maternal Aunt    Osteoporosis Neg Hx     ALLERGIES:  has No Known Allergies.  MEDICATIONS:  Current Outpatient Medications  Medication Sig Dispense Refill   aluminum chloride (DRYSOL) 20 % external solution Apply 1 application topically at bedtime.     apixaban (ELIQUIS) 5 MG TABS tablet Take 1 tablet (5 mg total) by mouth 2 (two) times daily. 60 tablet 5   calcium-vitamin D (OSCAL WITH D) 250-125 MG-UNIT tablet Take 1 tablet by mouth daily.     cyclobenzaprine (FLEXERIL) 10 MG tablet Take 10 mg by mouth 3 (three) times daily as needed for muscle spasms.     diclofenac sodium (VOLTAREN) 1 % GEL Apply 2 g topically as needed.     dronabinol (MARINOL) 5 MG capsule Take 1 capsule (5 mg total) by mouth 2 (two) times daily before a meal. 60 capsule 0   ergocalciferol (VITAMIN  D2) 1.25 MG (50000 UT) capsule Take 1 capsule (50,000 Units total) by mouth 2 (two) times daily. 12 capsule 2   esomeprazole (NEXIUM) 40 MG capsule Take 1 capsule (40 mg total) by mouth daily before breakfast. 30 capsule 1   fentaNYL (DURAGESIC) 25 MCG/HR Place 1 patch onto the skin every 3 (three) days. 10 patch 0   furosemide (LASIX) 20 MG tablet Take 1 tablet (20 mg total) by mouth daily. (Patient not taking: Reported on 04/23/2022) 30 tablet 1   lidocaine (LIDODERM) 5 % Place 1 patch onto the skin daily. Remove & Discard patch within 12 hours or as directed by MD 30 patch 0   methylPREDNISolone (MEDROL  DOSEPAK) 4 MG TBPK tablet Take 6 pills by mouth day 1, 5 on day 2, 4 on day 3, 3 on day 4, 2 on day 5, 1 on day 6 21 tablet 0   Multiple Vitamin (MULTIVITAMIN) tablet Take 1 tablet by mouth daily.     Omega-3 Fatty Acids (FISH OIL) 1360 MG CAPS Take 1 capsule by mouth daily.     ondansetron (ZOFRAN) 8 MG tablet Take 1 tablet (8 mg total) by mouth every 8 (eight) hours as needed for nausea or vomiting. (Patient not taking: Reported on 04/23/2022) 30 tablet 0   Oxycodone HCl 10 MG TABS Take 1/2 TO 1 tablet (5-10 mg total) by mouth every 4 (four) hours as needed for severe pain or moderate pain (cancer related pain). 30 tablet 0   REVLIMID 15 MG capsule TAKE 1 CAPSULE DAILY FOR 14 DAYS ON AND 7 DAYS OFF 14 capsule 0   senna-docusate (SENNA S) 8.6-50 MG tablet Take 2 tablets by mouth at bedtime. 60 tablet 1   terbinafine (LAMISIL) 1 % cream Apply 1 application topically 2 (two) times daily.     No current facility-administered medications for this visit.    10 Point review of Systems was done is negative except as noted above.  PHYSICAL EXAMINATION: Vital signs reviewed  NAD GENERAL:alert, in no acute distress and comfortable SKIN: no acute rashes, no significant lesions EYES: conjunctiva are pink and non-injected, sclera anicteric OROPHARYNX: MMM, no exudates, no oropharyngeal erythema or  ulceration NECK: supple, no JVD LYMPH:  no palpable lymphadenopathy in the cervical, axillary or inguinal regions LUNGS: clear to auscultation b/l with normal respiratory effort HEART: regular rate & rhythm ABDOMEN:  normoactive bowel sounds , non tender, not distended. Extremity: no pedal edema PSYCH: alert & oriented x 3 with fluent speech NEURO: no focal motor/sensory deficits    LABORATORY DATA:  I have reviewed the data as listed .    Latest Ref Rng & Units 05/14/2022    9:25 AM 05/08/2022    9:08 AM 04/29/2022    1:58 PM  CBC  WBC 4.0 - 10.5 K/uL 3.8  5.5  5.0   Hemoglobin 13.0 - 17.0 g/dL 13.7  14.0  14.0   Hematocrit 39.0 - 52.0 % 40.5  42.1  41.7   Platelets 150 - 400 K/uL 119  189  153       Latest Ref Rng & Units 05/14/2022    9:25 AM 05/08/2022    9:08 AM 04/24/2022    8:15 AM  CMP  Glucose 70 - 99 mg/dL 127  126  85   BUN 8 - 23 mg/dL 11  14  9    Creatinine 0.61 - 1.24 mg/dL 0.75  0.78  0.80   Sodium 135 - 145 mmol/L 135  137  136   Potassium 3.5 - 5.1 mmol/L 4.5  4.0  4.3   Chloride 98 - 111 mmol/L 105  108  106   CO2 22 - 32 mmol/L 25  23  24    Calcium 8.9 - 10.3 mg/dL 8.2  8.2  8.7   Total Protein 6.5 - 8.1 g/dL 6.0  5.8  6.1   Total Bilirubin 0.3 - 1.2 mg/dL 0.7  0.7  0.6   Alkaline Phos 38 - 126 U/L 59  75  77   AST 15 - 41 U/L 11  14  12    ALT 0 - 44 U/L 12  14  15      02/06/2022 Molecular pathology  RADIOGRAPHIC STUDIES: I have personally reviewed the radiological images as listed and agreed with the findings in the report. IR KYPHO LUMBAR INC FX REDUCE BONE BX UNI/BIL CANNULATION INC/IMAGING  Result Date: 05/07/2022 CLINICAL DATA:  61 year old male with a history of multiple myeloma and multiple painful pathologic compression fractures with delayed healing. At the time of initial consultation, symptomatic compression fractures were identified at T12 and L2. However, on the day of the procedure, fluoroscopic imaging for operative site localization  revealed a new significant pathologic compression fracture at T11 as well. The patient and his wife confirmed increasing symptoms in this region. Therefore, we elected to proceed with combined radiofrequency ablation and cement augmentation with balloon kyphoplasty at all 3 affected levels. G53.64WO Pathological fracture in neoplastic disease, other specified site, initial encounter for fracture C90.00* Multiple myeloma not having achieved remission EXAM: FLUOROSCOPIC GUIDED T11 VERTEBRAL BODY BIOPSY RF ABLATION AND KYPHOPLASTY/CEMENT AUGMENTATION. FLUOROSCOPIC GUIDED T12 VERTEBRAL BODY BIOPSY RF ABLATION AND KYPHOPLASTY/CEMENT AUGMENTATION. FLUOROSCOPIC GUIDED L2 VERTEBRAL BODY BIOPSY RF ABLATION AND KYPHOPLASTY/CEMENT AUGMENTATION. COMPARISON:  None Available. MEDICATIONS: Ancef 2 g IV; The antibiotic was administered in an appropriate time interval prior to needle puncture of the skin. ANESTHESIA/SEDATION: Versed 8 mg IV; Fentanyl 300 mcg IV Moderate Sedation Time: 90 minutes; The patient's vital signs and level of consciousness were continuously monitored during the procedure by the interventional radiology nurse under my direct supervision. FLUOROSCOPY TIME:  Radiation exposure index: 201.1 mGy air kerma COMPLICATIONS: None immediate. TECHNIQUE: Informed written consent was obtained from the patient after a thorough discussion of the procedural risks, benefits and alternatives. All questions were addressed. Maximal Sterile Barrier Technique was utilized including caps, mask, sterile gowns, sterile gloves, sterile drape, hand hygiene and skin antiseptic. A timeout was performed prior to the initiation of the procedure. The patient was placed prone on the fluoroscopic table. The skin overlying the upper thoracic region was then prepped and draped in the usual sterile fashion. Maximal barrier sterile technique was utilized including caps, mask, sterile gowns, sterile gloves, sterile drape, hand hygiene and skin  antiseptic. Intravenous Fentanyl and Versed were administered as conscious sedation during continuous cardiorespiratory monitoring by the radiology RN. T11 The left pedicle at T11 was then infiltrated with 1% lidocaine followed by the advancement of a Kyphon trocar needle through the left pedicle into the posterior one-third of the vertebral body. A biopsy specimen was then obtained. Subsequently, the osteo drill was advanced to the anterior third of the vertebral body. The osteo drill was retracted. Through the working cannula, a 15 mm OsteoCool RF ablation probe was inserted and positioned under fluoroscopic guidance. In similar fashion, the right T11 pedicle was infiltrated with 1% lidocaine. Utilizing a extra pedicular approach, a second Kyphon trocar needle was advanced into the posterior third of the vertebral body. A biopsy specimen was then obtained. Subsequently, the osteo drill was coaxially advanced to the anterior right third. The osteo drill was exchanged for a 15 mm OsteoCool RF ablation probe which was positioned under fluoroscopic guidance. With both OsteoCool ablation probes in place, the ablation was performed for 11.5 minutes. Attention was now paid towards the kyphoplasty portion of the procedure. Beginning at the T11 vertebral body level, a Kyphon inflatable bone tamp 15 x 2.5 was advanced through both working cannulas and positioned with the distal marker approximately 5 mm from the anterior aspect of the cortex. Appropriate positioning was confirmed on the AP projection. At this time, the balloon was expanded using contrast via a Kyphon  inflation syringe device via micro tubing. Inflations were continued under direct fluoroscopic guidance. T12 The left pedicle at T12 was then infiltrated with 1% lidocaine followed by the advancement of a Kyphon trocar needle through the left pedicle into the posterior one-third of the vertebral body. A biopsy specimen was then obtained. Subsequently, the osteo  drill was advanced to the anterior third of the vertebral body. The osteo drill was retracted. Through the working cannula, a 15 mm OsteoCool RF ablation probe was inserted and positioned under fluoroscopic guidance. In similar fashion, the right T12 pedicle was infiltrated with 1% lidocaine. Utilizing a extra pedicular approach, a second Kyphon trocar needle was advanced into the posterior third of the vertebral body. A biopsy specimen was then obtained. Subsequently, the osteo drill was coaxially advanced to the anterior right third. The osteo drill was exchanged for a 15 mm OsteoCool RF ablation probe which was positioned under fluoroscopic guidance. With both OsteoCool ablation probes in place, the ablation was performed for 11.5 minutes. Attention was now paid towards the kyphoplasty portion of the procedure. Beginning at the vertebral body level, a Kyphon inflatable bone tamp 15 x 2.5 was advanced through both working cannulas and positioned with the distal marker approximately 5 mm from the anterior aspect of the cortex. Appropriate positioning was confirmed on the AP projection. At this time, the balloon was expanded using contrast via a Kyphon inflation syringe device via micro tubing. Inflations were continued under direct fluoroscopic guidance. L2 The left pedicle at L2 was then infiltrated with 1% lidocaine followed by the advancement of a Kyphon trocar needle through the left pedicle into the posterior one-third of the vertebral body. Subsequently, the osteo drill was advanced to the anterior third of the vertebral body. The osteo drill was retracted. Through the working cannula, a 15 mm OsteoCool RF ablation probe was inserted and positioned under fluoroscopic guidance. In similar fashion, the right L2 pedicle was infiltrated with 1% lidocaine. Utilizing a extra pedicular approach, a second Kyphon trocar needle was advanced into the posterior third of the vertebral body. Subsequently, the osteo drill  was coaxially advanced to the anterior right third. The osteo drill was exchanged for a 15 mm OsteoCool RF ablation probe which was positioned under fluoroscopic guidance. With both OsteoCool ablation probes in place, the ablation was performed for 11.5 minutes. Attention was now paid towards the kyphoplasty portion of the procedure. Beginning at the L2 vertebral body level, a Kyphon inflatable bone tamp 15 x 2.5 was advanced through both working cannulas and positioned with the distal marker approximately 5 mm from the anterior aspect of the cortex. Appropriate positioning was confirmed on the AP projection. At this time, the balloon was expanded using contrast via a Kyphon inflation syringe device via micro tubing. Inflations were continued under direct fluoroscopic guidance. At this time, methylmethacrylate mixture was reconstituted in the Kyphon bone mixing device system. This was then loaded into the delivery mechanism, attached to Kyphon bone fillers and the cement delivery system. The balloons were deflated and removed followed by the instillation of methylmethacrylate mixture with excellent filling in the AP and lateral projections. The working cannulae and the bone filler were then retrieved and removed. Multiple spot radiographic images were obtained in various obliquities. Hemostasis was achieved with manual compression. The patient tolerated the procedure well without immediate postprocedural complication. FINDINGS: Completion images demonstrate a technically excellent result with adequate cement filling of the vertebral bodies on both the AP and lateral projections. No extravasation was noted  in the disk spaces or posteriorly into the spinal canal. No epidural venous contamination was seen. IMPRESSION: Technically successful T11 vertebral body biopsy, ablation and cement augmentation using balloon kyphoplasty. Technically successful T12 vertebral body biopsy, ablation and cement augmentation using  balloon kyphoplasty. Technically successful L2 vertebral body ablation and cement augmentation using balloon kyphoplasty. PLAN: The patient will be seen for clinical follow-up at the interventional radiology clinic in 2-4 weeks. Signed, Criselda Peaches, MD [facility] Radiology Electronically Signed   By: Jacqulynn Cadet M.D.   On: 05/07/2022 13:51   IR KYPHO EA ADDL LEVEL THORACIC OR LUMBAR  Result Date: 05/07/2022 CLINICAL DATA:  61 year old male with a history of multiple myeloma and multiple painful pathologic compression fractures with delayed healing. At the time of initial consultation, symptomatic compression fractures were identified at T12 and L2. However, on the day of the procedure, fluoroscopic imaging for operative site localization revealed a new significant pathologic compression fracture at T11 as well. The patient and his wife confirmed increasing symptoms in this region. Therefore, we elected to proceed with combined radiofrequency ablation and cement augmentation with balloon kyphoplasty at all 3 affected levels. Z61.09UE Pathological fracture in neoplastic disease, other specified site, initial encounter for fracture C90.00* Multiple myeloma not having achieved remission EXAM: FLUOROSCOPIC GUIDED T11 VERTEBRAL BODY BIOPSY RF ABLATION AND KYPHOPLASTY/CEMENT AUGMENTATION. FLUOROSCOPIC GUIDED T12 VERTEBRAL BODY BIOPSY RF ABLATION AND KYPHOPLASTY/CEMENT AUGMENTATION. FLUOROSCOPIC GUIDED L2 VERTEBRAL BODY BIOPSY RF ABLATION AND KYPHOPLASTY/CEMENT AUGMENTATION. COMPARISON:  None Available. MEDICATIONS: Ancef 2 g IV; The antibiotic was administered in an appropriate time interval prior to needle puncture of the skin. ANESTHESIA/SEDATION: Versed 8 mg IV; Fentanyl 300 mcg IV Moderate Sedation Time: 90 minutes; The patient's vital signs and level of consciousness were continuously monitored during the procedure by the interventional radiology  nurse under my direct supervision. FLUOROSCOPY TIME:  Radiation exposure index: 201.1 mGy air kerma COMPLICATIONS: None immediate. TECHNIQUE: Informed written consent was obtained from the patient after a thorough discussion of the procedural risks, benefits and alternatives. All questions were addressed. Maximal Sterile Barrier Technique was utilized including caps, mask, sterile gowns, sterile gloves, sterile drape, hand hygiene and skin antiseptic. A timeout was performed prior to the initiation of the procedure. The patient was placed prone on the fluoroscopic table. The skin overlying the upper thoracic region was then prepped and draped in the usual sterile fashion. Maximal barrier sterile technique was utilized including caps, mask, sterile gowns, sterile gloves, sterile drape, hand hygiene and skin antiseptic. Intravenous Fentanyl and Versed were administered as conscious sedation during continuous cardiorespiratory monitoring by the radiology RN. T11 The left pedicle at T11 was then infiltrated with 1% lidocaine followed by the advancement of a Kyphon trocar needle through the left pedicle into the posterior one-third of the vertebral body. A biopsy specimen was then obtained. Subsequently, the osteo drill was advanced to the anterior third of the vertebral body. The osteo drill was retracted. Through the working cannula, a 15 mm OsteoCool RF ablation probe was inserted and positioned under fluoroscopic guidance. In similar fashion, the right T11 pedicle was infiltrated with 1% lidocaine. Utilizing a extra pedicular approach, a second Kyphon trocar needle was advanced into the posterior third of the vertebral body. A biopsy specimen was then obtained. Subsequently, the osteo drill was coaxially advanced to the anterior right third. The osteo drill was exchanged for a 15 mm OsteoCool RF ablation probe which was positioned under fluoroscopic guidance. With both OsteoCool ablation  probes in place, the ablation  was performed for 11.5 minutes. Attention was now paid towards the kyphoplasty portion of the procedure. Beginning at the T11 vertebral body level, a Kyphon inflatable bone tamp 15 x 2.5 was advanced through both working cannulas and positioned with the distal marker approximately 5 mm from the anterior aspect of the cortex. Appropriate positioning was confirmed on the AP projection. At this time, the balloon was expanded using contrast via a Kyphon inflation syringe device via micro tubing. Inflations were continued under direct fluoroscopic guidance. T12 The left pedicle at T12 was then infiltrated with 1% lidocaine followed by the advancement of a Kyphon trocar needle through the left pedicle into the posterior one-third of the vertebral body. A biopsy specimen was then obtained. Subsequently, the osteo drill was advanced to the anterior third of the vertebral body. The osteo drill was retracted. Through the working cannula, a 15 mm OsteoCool RF ablation probe was inserted and positioned under fluoroscopic guidance. In similar fashion, the right T12 pedicle was infiltrated with 1% lidocaine. Utilizing a extra pedicular approach, a second Kyphon trocar needle was advanced into the posterior third of the vertebral body. A biopsy specimen was then obtained. Subsequently, the osteo drill was coaxially advanced to the anterior right third. The osteo drill was exchanged for a 15 mm OsteoCool RF ablation probe which was positioned under fluoroscopic guidance. With both OsteoCool ablation probes in place, the ablation was performed for 11.5 minutes. Attention was now paid towards the kyphoplasty portion of the procedure. Beginning at the vertebral body level, a Kyphon inflatable bone tamp 15 x 2.5 was advanced through both working cannulas and positioned with the distal marker approximately 5 mm from the anterior aspect of the cortex. Appropriate positioning was confirmed on the AP projection. At this time, the balloon  was expanded using contrast via a Kyphon inflation syringe device via micro tubing. Inflations were continued under direct fluoroscopic guidance. L2 The left pedicle at L2 was then infiltrated with 1% lidocaine followed by the advancement of a Kyphon trocar needle through the left pedicle into the posterior one-third of the vertebral body. Subsequently, the osteo drill was advanced to the anterior third of the vertebral body. The osteo drill was retracted. Through the working cannula, a 15 mm OsteoCool RF ablation probe was inserted and positioned under fluoroscopic guidance. In similar fashion, the right L2 pedicle was infiltrated with 1% lidocaine. Utilizing a extra pedicular approach, a second Kyphon trocar needle was advanced into the posterior third of the vertebral body. Subsequently, the osteo drill was coaxially advanced to the anterior right third. The osteo drill was exchanged for a 15 mm OsteoCool RF ablation probe which was positioned under fluoroscopic guidance. With both OsteoCool ablation probes in place, the ablation was performed for 11.5 minutes. Attention was now paid towards the kyphoplasty portion of the procedure. Beginning at the L2 vertebral body level, a Kyphon inflatable bone tamp 15 x 2.5 was advanced through both working cannulas and positioned with the distal marker approximately 5 mm from the anterior aspect of the cortex. Appropriate positioning was confirmed on the AP projection. At this time, the balloon was expanded using contrast via a Kyphon inflation syringe device via micro tubing. Inflations were continued under direct fluoroscopic guidance. At this time, methylmethacrylate mixture was reconstituted in the Kyphon bone mixing device system. This was then loaded into the delivery mechanism, attached to Kyphon bone fillers and the cement delivery system. The balloons were deflated and removed followed by  the instillation of methylmethacrylate mixture with excellent filling in the  AP and lateral projections. The working cannulae and the bone filler were then retrieved and removed. Multiple spot radiographic images were obtained in various obliquities. Hemostasis was achieved with manual compression. The patient tolerated the procedure well without immediate postprocedural complication. FINDINGS: Completion images demonstrate a technically excellent result with adequate cement filling of the vertebral bodies on both the AP and lateral projections. No extravasation was noted in the disk spaces or posteriorly into the spinal canal. No epidural venous contamination was seen. IMPRESSION: Technically successful T11 vertebral body biopsy, ablation and cement augmentation using balloon kyphoplasty. Technically successful T12 vertebral body biopsy, ablation and cement augmentation using balloon kyphoplasty. Technically successful L2 vertebral body ablation and cement augmentation using balloon kyphoplasty. PLAN: The patient will be seen for clinical follow-up at the interventional radiology clinic in 2-4 weeks. Signed, Criselda Peaches, MD [facility] Radiology Electronically Signed   By: Jacqulynn Cadet M.D.   On: 05/07/2022 13:51   IR Bone Tumor(s)RF Ablation  Result Date: 05/07/2022 CLINICAL DATA:  61 year old male with a history of multiple myeloma and multiple painful pathologic compression fractures with delayed healing. At the time of initial consultation, symptomatic compression fractures were identified at T12 and L2. However, on the day of the procedure, fluoroscopic imaging for operative site localization revealed a new significant pathologic compression fracture at T11 as well. The patient and his wife confirmed increasing symptoms in this region. Therefore, we elected to proceed with combined radiofrequency ablation and cement augmentation with balloon kyphoplasty at all 3 affected levels. T01.60FU Pathological fracture in  neoplastic disease, other specified site, initial encounter for fracture C90.00* Multiple myeloma not having achieved remission EXAM: FLUOROSCOPIC GUIDED T11 VERTEBRAL BODY BIOPSY RF ABLATION AND KYPHOPLASTY/CEMENT AUGMENTATION. FLUOROSCOPIC GUIDED T12 VERTEBRAL BODY BIOPSY RF ABLATION AND KYPHOPLASTY/CEMENT AUGMENTATION. FLUOROSCOPIC GUIDED L2 VERTEBRAL BODY BIOPSY RF ABLATION AND KYPHOPLASTY/CEMENT AUGMENTATION. COMPARISON:  None Available. MEDICATIONS: Ancef 2 g IV; The antibiotic was administered in an appropriate time interval prior to needle puncture of the skin. ANESTHESIA/SEDATION: Versed 8 mg IV; Fentanyl 300 mcg IV Moderate Sedation Time: 90 minutes; The patient's vital signs and level of consciousness were continuously monitored during the procedure by the interventional radiology nurse under my direct supervision. FLUOROSCOPY TIME:  Radiation exposure index: 201.1 mGy air kerma COMPLICATIONS: None immediate. TECHNIQUE: Informed written consent was obtained from the patient after a thorough discussion of the procedural risks, benefits and alternatives. All questions were addressed. Maximal Sterile Barrier Technique was utilized including caps, mask, sterile gowns, sterile gloves, sterile drape, hand hygiene and skin antiseptic. A timeout was performed prior to the initiation of the procedure. The patient was placed prone on the fluoroscopic table. The skin overlying the upper thoracic region was then prepped and draped in the usual sterile fashion. Maximal barrier sterile technique was utilized including caps, mask, sterile gowns, sterile gloves, sterile drape, hand hygiene and skin antiseptic. Intravenous Fentanyl and Versed were administered as conscious sedation during continuous cardiorespiratory monitoring by the radiology RN. T11 The left pedicle at T11 was then infiltrated with 1% lidocaine followed by the advancement of a Kyphon trocar needle through the left pedicle into the posterior one-third  of the vertebral body. A biopsy specimen was then obtained. Subsequently, the osteo drill was advanced to the anterior third of the vertebral body. The osteo drill was retracted. Through the working cannula, a 15 mm OsteoCool RF ablation probe was inserted and positioned under  fluoroscopic guidance. In similar fashion, the right T11 pedicle was infiltrated with 1% lidocaine. Utilizing a extra pedicular approach, a second Kyphon trocar needle was advanced into the posterior third of the vertebral body. A biopsy specimen was then obtained. Subsequently, the osteo drill was coaxially advanced to the anterior right third. The osteo drill was exchanged for a 15 mm OsteoCool RF ablation probe which was positioned under fluoroscopic guidance. With both OsteoCool ablation probes in place, the ablation was performed for 11.5 minutes. Attention was now paid towards the kyphoplasty portion of the procedure. Beginning at the T11 vertebral body level, a Kyphon inflatable bone tamp 15 x 2.5 was advanced through both working cannulas and positioned with the distal marker approximately 5 mm from the anterior aspect of the cortex. Appropriate positioning was confirmed on the AP projection. At this time, the balloon was expanded using contrast via a Kyphon inflation syringe device via micro tubing. Inflations were continued under direct fluoroscopic guidance. T12 The left pedicle at T12 was then infiltrated with 1% lidocaine followed by the advancement of a Kyphon trocar needle through the left pedicle into the posterior one-third of the vertebral body. A biopsy specimen was then obtained. Subsequently, the osteo drill was advanced to the anterior third of the vertebral body. The osteo drill was retracted. Through the working cannula, a 15 mm OsteoCool RF ablation probe was inserted and positioned under fluoroscopic guidance. In similar fashion, the right T12 pedicle was infiltrated with 1% lidocaine. Utilizing a extra pedicular  approach, a second Kyphon trocar needle was advanced into the posterior third of the vertebral body. A biopsy specimen was then obtained. Subsequently, the osteo drill was coaxially advanced to the anterior right third. The osteo drill was exchanged for a 15 mm OsteoCool RF ablation probe which was positioned under fluoroscopic guidance. With both OsteoCool ablation probes in place, the ablation was performed for 11.5 minutes. Attention was now paid towards the kyphoplasty portion of the procedure. Beginning at the vertebral body level, a Kyphon inflatable bone tamp 15 x 2.5 was advanced through both working cannulas and positioned with the distal marker approximately 5 mm from the anterior aspect of the cortex. Appropriate positioning was confirmed on the AP projection. At this time, the balloon was expanded using contrast via a Kyphon inflation syringe device via micro tubing. Inflations were continued under direct fluoroscopic guidance. L2 The left pedicle at L2 was then infiltrated with 1% lidocaine followed by the advancement of a Kyphon trocar needle through the left pedicle into the posterior one-third of the vertebral body. Subsequently, the osteo drill was advanced to the anterior third of the vertebral body. The osteo drill was retracted. Through the working cannula, a 15 mm OsteoCool RF ablation probe was inserted and positioned under fluoroscopic guidance. In similar fashion, the right L2 pedicle was infiltrated with 1% lidocaine. Utilizing a extra pedicular approach, a second Kyphon trocar needle was advanced into the posterior third of the vertebral body. Subsequently, the osteo drill was coaxially advanced to the anterior right third. The osteo drill was exchanged for a 15 mm OsteoCool RF ablation probe which was positioned under fluoroscopic guidance. With both OsteoCool ablation probes in place, the ablation was performed for 11.5 minutes. Attention was now paid towards the kyphoplasty portion of  the procedure. Beginning at the L2 vertebral body level, a Kyphon inflatable bone tamp 15 x 2.5 was advanced through both working cannulas and positioned with the distal marker approximately 5 mm from the anterior aspect of  the cortex. Appropriate positioning was confirmed on the AP projection. At this time, the balloon was expanded using contrast via a Kyphon inflation syringe device via micro tubing. Inflations were continued under direct fluoroscopic guidance. At this time, methylmethacrylate mixture was reconstituted in the Kyphon bone mixing device system. This was then loaded into the delivery mechanism, attached to Kyphon bone fillers and the cement delivery system. The balloons were deflated and removed followed by the instillation of methylmethacrylate mixture with excellent filling in the AP and lateral projections. The working cannulae and the bone filler were then retrieved and removed. Multiple spot radiographic images were obtained in various obliquities. Hemostasis was achieved with manual compression. The patient tolerated the procedure well without immediate postprocedural complication. FINDINGS: Completion images demonstrate a technically excellent result with adequate cement filling of the vertebral bodies on both the AP and lateral projections. No extravasation was noted in the disk spaces or posteriorly into the spinal canal. No epidural venous contamination was seen. IMPRESSION: Technically successful T11 vertebral body biopsy, ablation and cement augmentation using balloon kyphoplasty. Technically successful T12 vertebral body biopsy, ablation and cement augmentation using balloon kyphoplasty. Technically successful L2 vertebral body ablation and cement augmentation using balloon kyphoplasty. PLAN: The patient will be seen for clinical follow-up at the interventional radiology clinic in 2-4 weeks. Signed, Criselda Peaches, MD [facility]  Radiology Electronically Signed   By: Jacqulynn Cadet M.D.   On: 05/07/2022 13:51   IR Bone Tumor(s)RF Ablation  Result Date: 05/07/2022 CLINICAL DATA:  61 year old male with a history of multiple myeloma and multiple painful pathologic compression fractures with delayed healing. At the time of initial consultation, symptomatic compression fractures were identified at T12 and L2. However, on the day of the procedure, fluoroscopic imaging for operative site localization revealed a new significant pathologic compression fracture at T11 as well. The patient and his wife confirmed increasing symptoms in this region. Therefore, we elected to proceed with combined radiofrequency ablation and cement augmentation with balloon kyphoplasty at all 3 affected levels. R67.89FY Pathological fracture in neoplastic disease, other specified site, initial encounter for fracture C90.00* Multiple myeloma not having achieved remission EXAM: FLUOROSCOPIC GUIDED T11 VERTEBRAL BODY BIOPSY RF ABLATION AND KYPHOPLASTY/CEMENT AUGMENTATION. FLUOROSCOPIC GUIDED T12 VERTEBRAL BODY BIOPSY RF ABLATION AND KYPHOPLASTY/CEMENT AUGMENTATION. FLUOROSCOPIC GUIDED L2 VERTEBRAL BODY BIOPSY RF ABLATION AND KYPHOPLASTY/CEMENT AUGMENTATION. COMPARISON:  None Available. MEDICATIONS: Ancef 2 g IV; The antibiotic was administered in an appropriate time interval prior to needle puncture of the skin. ANESTHESIA/SEDATION: Versed 8 mg IV; Fentanyl 300 mcg IV Moderate Sedation Time: 90 minutes; The patient's vital signs and level of consciousness were continuously monitored during the procedure by the interventional radiology nurse under my direct supervision. FLUOROSCOPY TIME:  Radiation exposure index: 201.1 mGy air kerma COMPLICATIONS: None immediate. TECHNIQUE: Informed written consent was obtained from the patient after a thorough discussion of the procedural risks, benefits and alternatives. All questions were addressed. Maximal Sterile Barrier Technique  was utilized including caps, mask, sterile gowns, sterile gloves, sterile drape, hand hygiene and skin antiseptic. A timeout was performed prior to the initiation of the procedure. The patient was placed prone on the fluoroscopic table. The skin overlying the upper thoracic region was then prepped and draped in the usual sterile fashion. Maximal barrier sterile technique was utilized including caps, mask, sterile gowns, sterile gloves, sterile drape, hand hygiene and skin antiseptic. Intravenous Fentanyl and Versed were administered as conscious sedation during continuous cardiorespiratory monitoring by the radiology RN.  T11 The left pedicle at T11 was then infiltrated with 1% lidocaine followed by the advancement of a Kyphon trocar needle through the left pedicle into the posterior one-third of the vertebral body. A biopsy specimen was then obtained. Subsequently, the osteo drill was advanced to the anterior third of the vertebral body. The osteo drill was retracted. Through the working cannula, a 15 mm OsteoCool RF ablation probe was inserted and positioned under fluoroscopic guidance. In similar fashion, the right T11 pedicle was infiltrated with 1% lidocaine. Utilizing a extra pedicular approach, a second Kyphon trocar needle was advanced into the posterior third of the vertebral body. A biopsy specimen was then obtained. Subsequently, the osteo drill was coaxially advanced to the anterior right third. The osteo drill was exchanged for a 15 mm OsteoCool RF ablation probe which was positioned under fluoroscopic guidance. With both OsteoCool ablation probes in place, the ablation was performed for 11.5 minutes. Attention was now paid towards the kyphoplasty portion of the procedure. Beginning at the T11 vertebral body level, a Kyphon inflatable bone tamp 15 x 2.5 was advanced through both working cannulas and positioned with the distal marker approximately 5 mm from the anterior aspect of the cortex. Appropriate  positioning was confirmed on the AP projection. At this time, the balloon was expanded using contrast via a Kyphon inflation syringe device via micro tubing. Inflations were continued under direct fluoroscopic guidance. T12 The left pedicle at T12 was then infiltrated with 1% lidocaine followed by the advancement of a Kyphon trocar needle through the left pedicle into the posterior one-third of the vertebral body. A biopsy specimen was then obtained. Subsequently, the osteo drill was advanced to the anterior third of the vertebral body. The osteo drill was retracted. Through the working cannula, a 15 mm OsteoCool RF ablation probe was inserted and positioned under fluoroscopic guidance. In similar fashion, the right T12 pedicle was infiltrated with 1% lidocaine. Utilizing a extra pedicular approach, a second Kyphon trocar needle was advanced into the posterior third of the vertebral body. A biopsy specimen was then obtained. Subsequently, the osteo drill was coaxially advanced to the anterior right third. The osteo drill was exchanged for a 15 mm OsteoCool RF ablation probe which was positioned under fluoroscopic guidance. With both OsteoCool ablation probes in place, the ablation was performed for 11.5 minutes. Attention was now paid towards the kyphoplasty portion of the procedure. Beginning at the vertebral body level, a Kyphon inflatable bone tamp 15 x 2.5 was advanced through both working cannulas and positioned with the distal marker approximately 5 mm from the anterior aspect of the cortex. Appropriate positioning was confirmed on the AP projection. At this time, the balloon was expanded using contrast via a Kyphon inflation syringe device via micro tubing. Inflations were continued under direct fluoroscopic guidance. L2 The left pedicle at L2 was then infiltrated with 1% lidocaine followed by the advancement of a Kyphon trocar needle through the left pedicle into the posterior one-third of the vertebral  body. Subsequently, the osteo drill was advanced to the anterior third of the vertebral body. The osteo drill was retracted. Through the working cannula, a 15 mm OsteoCool RF ablation probe was inserted and positioned under fluoroscopic guidance. In similar fashion, the right L2 pedicle was infiltrated with 1% lidocaine. Utilizing a extra pedicular approach, a second Kyphon trocar needle was advanced into the posterior third of the vertebral body. Subsequently, the osteo drill was coaxially advanced to the anterior right third. The osteo drill was exchanged  for a 15 mm OsteoCool RF ablation probe which was positioned under fluoroscopic guidance. With both OsteoCool ablation probes in place, the ablation was performed for 11.5 minutes. Attention was now paid towards the kyphoplasty portion of the procedure. Beginning at the L2 vertebral body level, a Kyphon inflatable bone tamp 15 x 2.5 was advanced through both working cannulas and positioned with the distal marker approximately 5 mm from the anterior aspect of the cortex. Appropriate positioning was confirmed on the AP projection. At this time, the balloon was expanded using contrast via a Kyphon inflation syringe device via micro tubing. Inflations were continued under direct fluoroscopic guidance. At this time, methylmethacrylate mixture was reconstituted in the Kyphon bone mixing device system. This was then loaded into the delivery mechanism, attached to Kyphon bone fillers and the cement delivery system. The balloons were deflated and removed followed by the instillation of methylmethacrylate mixture with excellent filling in the AP and lateral projections. The working cannulae and the bone filler were then retrieved and removed. Multiple spot radiographic images were obtained in various obliquities. Hemostasis was achieved with manual compression. The patient tolerated the procedure well without immediate postprocedural complication. FINDINGS: Completion  images demonstrate a technically excellent result with adequate cement filling of the vertebral bodies on both the AP and lateral projections. No extravasation was noted in the disk spaces or posteriorly into the spinal canal. No epidural venous contamination was seen. IMPRESSION: Technically successful T11 vertebral body biopsy, ablation and cement augmentation using balloon kyphoplasty. Technically successful T12 vertebral body biopsy, ablation and cement augmentation using balloon kyphoplasty. Technically successful L2 vertebral body ablation and cement augmentation using balloon kyphoplasty. PLAN: The patient will be seen for clinical follow-up at the interventional radiology clinic in 2-4 weeks. Signed, Criselda Peaches, MD [facility] Radiology Electronically Signed   By: Jacqulynn Cadet M.D.   On: 05/07/2022 13:51   IR KYPHO THORACIC WITH BONE BIOPSY  Result Date: 05/07/2022 CLINICAL DATA:  61 year old male with a history of multiple myeloma and multiple painful pathologic compression fractures with delayed healing. At the time of initial consultation, symptomatic compression fractures were identified at T12 and L2. However, on the day of the procedure, fluoroscopic imaging for operative site localization revealed a new significant pathologic compression fracture at T11 as well. The patient and his wife confirmed increasing symptoms in this region. Therefore, we elected to proceed with combined radiofrequency ablation and cement augmentation with balloon kyphoplasty at all 3 affected levels. H08.65HQ Pathological fracture in neoplastic disease, other specified site, initial encounter for fracture C90.00* Multiple myeloma not having achieved remission EXAM: FLUOROSCOPIC GUIDED T11 VERTEBRAL BODY BIOPSY RF ABLATION AND KYPHOPLASTY/CEMENT AUGMENTATION. FLUOROSCOPIC GUIDED T12 VERTEBRAL BODY BIOPSY RF ABLATION AND KYPHOPLASTY/CEMENT AUGMENTATION.  FLUOROSCOPIC GUIDED L2 VERTEBRAL BODY BIOPSY RF ABLATION AND KYPHOPLASTY/CEMENT AUGMENTATION. COMPARISON:  None Available. MEDICATIONS: Ancef 2 g IV; The antibiotic was administered in an appropriate time interval prior to needle puncture of the skin. ANESTHESIA/SEDATION: Versed 8 mg IV; Fentanyl 300 mcg IV Moderate Sedation Time: 90 minutes; The patient's vital signs and level of consciousness were continuously monitored during the procedure by the interventional radiology nurse under my direct supervision. FLUOROSCOPY TIME:  Radiation exposure index: 201.1 mGy air kerma COMPLICATIONS: None immediate. TECHNIQUE: Informed written consent was obtained from the patient after a thorough discussion of the procedural risks, benefits and alternatives. All questions were addressed. Maximal Sterile Barrier Technique was utilized including caps, mask, sterile gowns, sterile gloves, sterile drape, hand hygiene and  skin antiseptic. A timeout was performed prior to the initiation of the procedure. The patient was placed prone on the fluoroscopic table. The skin overlying the upper thoracic region was then prepped and draped in the usual sterile fashion. Maximal barrier sterile technique was utilized including caps, mask, sterile gowns, sterile gloves, sterile drape, hand hygiene and skin antiseptic. Intravenous Fentanyl and Versed were administered as conscious sedation during continuous cardiorespiratory monitoring by the radiology RN. T11 The left pedicle at T11 was then infiltrated with 1% lidocaine followed by the advancement of a Kyphon trocar needle through the left pedicle into the posterior one-third of the vertebral body. A biopsy specimen was then obtained. Subsequently, the osteo drill was advanced to the anterior third of the vertebral body. The osteo drill was retracted. Through the working cannula, a 15 mm OsteoCool RF ablation probe was inserted and positioned under fluoroscopic guidance. In similar fashion, the  right T11 pedicle was infiltrated with 1% lidocaine. Utilizing a extra pedicular approach, a second Kyphon trocar needle was advanced into the posterior third of the vertebral body. A biopsy specimen was then obtained. Subsequently, the osteo drill was coaxially advanced to the anterior right third. The osteo drill was exchanged for a 15 mm OsteoCool RF ablation probe which was positioned under fluoroscopic guidance. With both OsteoCool ablation probes in place, the ablation was performed for 11.5 minutes. Attention was now paid towards the kyphoplasty portion of the procedure. Beginning at the T11 vertebral body level, a Kyphon inflatable bone tamp 15 x 2.5 was advanced through both working cannulas and positioned with the distal marker approximately 5 mm from the anterior aspect of the cortex. Appropriate positioning was confirmed on the AP projection. At this time, the balloon was expanded using contrast via a Kyphon inflation syringe device via micro tubing. Inflations were continued under direct fluoroscopic guidance. T12 The left pedicle at T12 was then infiltrated with 1% lidocaine followed by the advancement of a Kyphon trocar needle through the left pedicle into the posterior one-third of the vertebral body. A biopsy specimen was then obtained. Subsequently, the osteo drill was advanced to the anterior third of the vertebral body. The osteo drill was retracted. Through the working cannula, a 15 mm OsteoCool RF ablation probe was inserted and positioned under fluoroscopic guidance. In similar fashion, the right T12 pedicle was infiltrated with 1% lidocaine. Utilizing a extra pedicular approach, a second Kyphon trocar needle was advanced into the posterior third of the vertebral body. A biopsy specimen was then obtained. Subsequently, the osteo drill was coaxially advanced to the anterior right third. The osteo drill was exchanged for a 15 mm OsteoCool RF ablation probe which was positioned under fluoroscopic  guidance. With both OsteoCool ablation probes in place, the ablation was performed for 11.5 minutes. Attention was now paid towards the kyphoplasty portion of the procedure. Beginning at the vertebral body level, a Kyphon inflatable bone tamp 15 x 2.5 was advanced through both working cannulas and positioned with the distal marker approximately 5 mm from the anterior aspect of the cortex. Appropriate positioning was confirmed on the AP projection. At this time, the balloon was expanded using contrast via a Kyphon inflation syringe device via micro tubing. Inflations were continued under direct fluoroscopic guidance. L2 The left pedicle at L2 was then infiltrated with 1% lidocaine followed by the advancement of a Kyphon trocar needle through the left pedicle into the posterior one-third of the vertebral body. Subsequently, the osteo drill was advanced to the anterior third  of the vertebral body. The osteo drill was retracted. Through the working cannula, a 15 mm OsteoCool RF ablation probe was inserted and positioned under fluoroscopic guidance. In similar fashion, the right L2 pedicle was infiltrated with 1% lidocaine. Utilizing a extra pedicular approach, a second Kyphon trocar needle was advanced into the posterior third of the vertebral body. Subsequently, the osteo drill was coaxially advanced to the anterior right third. The osteo drill was exchanged for a 15 mm OsteoCool RF ablation probe which was positioned under fluoroscopic guidance. With both OsteoCool ablation probes in place, the ablation was performed for 11.5 minutes. Attention was now paid towards the kyphoplasty portion of the procedure. Beginning at the L2 vertebral body level, a Kyphon inflatable bone tamp 15 x 2.5 was advanced through both working cannulas and positioned with the distal marker approximately 5 mm from the anterior aspect of the cortex. Appropriate positioning was confirmed on the AP projection. At this time, the balloon was  expanded using contrast via a Kyphon inflation syringe device via micro tubing. Inflations were continued under direct fluoroscopic guidance. At this time, methylmethacrylate mixture was reconstituted in the Kyphon bone mixing device system. This was then loaded into the delivery mechanism, attached to Kyphon bone fillers and the cement delivery system. The balloons were deflated and removed followed by the instillation of methylmethacrylate mixture with excellent filling in the AP and lateral projections. The working cannulae and the bone filler were then retrieved and removed. Multiple spot radiographic images were obtained in various obliquities. Hemostasis was achieved with manual compression. The patient tolerated the procedure well without immediate postprocedural complication. FINDINGS: Completion images demonstrate a technically excellent result with adequate cement filling of the vertebral bodies on both the AP and lateral projections. No extravasation was noted in the disk spaces or posteriorly into the spinal canal. No epidural venous contamination was seen. IMPRESSION: Technically successful T11 vertebral body biopsy, ablation and cement augmentation using balloon kyphoplasty. Technically successful T12 vertebral body biopsy, ablation and cement augmentation using balloon kyphoplasty. Technically successful L2 vertebral body ablation and cement augmentation using balloon kyphoplasty. PLAN: The patient will be seen for clinical follow-up at the interventional radiology clinic in 2-4 weeks. Signed, Criselda Peaches, MD [facility] Radiology Electronically Signed   By: Jacqulynn Cadet M.D.   On: 05/07/2022 13:51   IR Bone Tumor(s)RF Ablation  Result Date: 05/07/2022 CLINICAL DATA:  61 year old male with a history of multiple myeloma and multiple painful pathologic compression fractures with delayed healing. At the time of initial consultation,  symptomatic compression fractures were identified at T12 and L2. However, on the day of the procedure, fluoroscopic imaging for operative site localization revealed a new significant pathologic compression fracture at T11 as well. The patient and his wife confirmed increasing symptoms in this region. Therefore, we elected to proceed with combined radiofrequency ablation and cement augmentation with balloon kyphoplasty at all 3 affected levels. M08.67YP Pathological fracture in neoplastic disease, other specified site, initial encounter for fracture C90.00* Multiple myeloma not having achieved remission EXAM: FLUOROSCOPIC GUIDED T11 VERTEBRAL BODY BIOPSY RF ABLATION AND KYPHOPLASTY/CEMENT AUGMENTATION. FLUOROSCOPIC GUIDED T12 VERTEBRAL BODY BIOPSY RF ABLATION AND KYPHOPLASTY/CEMENT AUGMENTATION. FLUOROSCOPIC GUIDED L2 VERTEBRAL BODY BIOPSY RF ABLATION AND KYPHOPLASTY/CEMENT AUGMENTATION. COMPARISON:  None Available. MEDICATIONS: Ancef 2 g IV; The antibiotic was administered in an appropriate time interval prior to needle puncture of the skin. ANESTHESIA/SEDATION: Versed 8 mg IV; Fentanyl 300 mcg IV Moderate Sedation Time: 90 minutes; The patient's vital signs  and level of consciousness were continuously monitored during the procedure by the interventional radiology nurse under my direct supervision. FLUOROSCOPY TIME:  Radiation exposure index: 201.1 mGy air kerma COMPLICATIONS: None immediate. TECHNIQUE: Informed written consent was obtained from the patient after a thorough discussion of the procedural risks, benefits and alternatives. All questions were addressed. Maximal Sterile Barrier Technique was utilized including caps, mask, sterile gowns, sterile gloves, sterile drape, hand hygiene and skin antiseptic. A timeout was performed prior to the initiation of the procedure. The patient was placed prone on the fluoroscopic table. The skin overlying the upper thoracic region was then prepped and draped in the usual  sterile fashion. Maximal barrier sterile technique was utilized including caps, mask, sterile gowns, sterile gloves, sterile drape, hand hygiene and skin antiseptic. Intravenous Fentanyl and Versed were administered as conscious sedation during continuous cardiorespiratory monitoring by the radiology RN. T11 The left pedicle at T11 was then infiltrated with 1% lidocaine followed by the advancement of a Kyphon trocar needle through the left pedicle into the posterior one-third of the vertebral body. A biopsy specimen was then obtained. Subsequently, the osteo drill was advanced to the anterior third of the vertebral body. The osteo drill was retracted. Through the working cannula, a 15 mm OsteoCool RF ablation probe was inserted and positioned under fluoroscopic guidance. In similar fashion, the right T11 pedicle was infiltrated with 1% lidocaine. Utilizing a extra pedicular approach, a second Kyphon trocar needle was advanced into the posterior third of the vertebral body. A biopsy specimen was then obtained. Subsequently, the osteo drill was coaxially advanced to the anterior right third. The osteo drill was exchanged for a 15 mm OsteoCool RF ablation probe which was positioned under fluoroscopic guidance. With both OsteoCool ablation probes in place, the ablation was performed for 11.5 minutes. Attention was now paid towards the kyphoplasty portion of the procedure. Beginning at the T11 vertebral body level, a Kyphon inflatable bone tamp 15 x 2.5 was advanced through both working cannulas and positioned with the distal marker approximately 5 mm from the anterior aspect of the cortex. Appropriate positioning was confirmed on the AP projection. At this time, the balloon was expanded using contrast via a Kyphon inflation syringe device via micro tubing. Inflations were continued under direct fluoroscopic guidance. T12 The left pedicle at T12 was then infiltrated with 1% lidocaine followed by the advancement of a  Kyphon trocar needle through the left pedicle into the posterior one-third of the vertebral body. A biopsy specimen was then obtained. Subsequently, the osteo drill was advanced to the anterior third of the vertebral body. The osteo drill was retracted. Through the working cannula, a 15 mm OsteoCool RF ablation probe was inserted and positioned under fluoroscopic guidance. In similar fashion, the right T12 pedicle was infiltrated with 1% lidocaine. Utilizing a extra pedicular approach, a second Kyphon trocar needle was advanced into the posterior third of the vertebral body. A biopsy specimen was then obtained. Subsequently, the osteo drill was coaxially advanced to the anterior right third. The osteo drill was exchanged for a 15 mm OsteoCool RF ablation probe which was positioned under fluoroscopic guidance. With both OsteoCool ablation probes in place, the ablation was performed for 11.5 minutes. Attention was now paid towards the kyphoplasty portion of the procedure. Beginning at the vertebral body level, a Kyphon inflatable bone tamp 15 x 2.5 was advanced through both working cannulas and positioned with the distal marker approximately 5 mm from the anterior aspect of the cortex. Appropriate positioning  was confirmed on the AP projection. At this time, the balloon was expanded using contrast via a Kyphon inflation syringe device via micro tubing. Inflations were continued under direct fluoroscopic guidance. L2 The left pedicle at L2 was then infiltrated with 1% lidocaine followed by the advancement of a Kyphon trocar needle through the left pedicle into the posterior one-third of the vertebral body. Subsequently, the osteo drill was advanced to the anterior third of the vertebral body. The osteo drill was retracted. Through the working cannula, a 15 mm OsteoCool RF ablation probe was inserted and positioned under fluoroscopic guidance. In similar fashion, the right L2 pedicle was infiltrated with 1% lidocaine.  Utilizing a extra pedicular approach, a second Kyphon trocar needle was advanced into the posterior third of the vertebral body. Subsequently, the osteo drill was coaxially advanced to the anterior right third. The osteo drill was exchanged for a 15 mm OsteoCool RF ablation probe which was positioned under fluoroscopic guidance. With both OsteoCool ablation probes in place, the ablation was performed for 11.5 minutes. Attention was now paid towards the kyphoplasty portion of the procedure. Beginning at the L2 vertebral body level, a Kyphon inflatable bone tamp 15 x 2.5 was advanced through both working cannulas and positioned with the distal marker approximately 5 mm from the anterior aspect of the cortex. Appropriate positioning was confirmed on the AP projection. At this time, the balloon was expanded using contrast via a Kyphon inflation syringe device via micro tubing. Inflations were continued under direct fluoroscopic guidance. At this time, methylmethacrylate mixture was reconstituted in the Kyphon bone mixing device system. This was then loaded into the delivery mechanism, attached to Kyphon bone fillers and the cement delivery system. The balloons were deflated and removed followed by the instillation of methylmethacrylate mixture with excellent filling in the AP and lateral projections. The working cannulae and the bone filler were then retrieved and removed. Multiple spot radiographic images were obtained in various obliquities. Hemostasis was achieved with manual compression. The patient tolerated the procedure well without immediate postprocedural complication. FINDINGS: Completion images demonstrate a technically excellent result with adequate cement filling of the vertebral bodies on both the AP and lateral projections. No extravasation was noted in the disk spaces or posteriorly into the spinal canal. No epidural venous contamination was seen. IMPRESSION: Technically successful T11 vertebral body  biopsy, ablation and cement augmentation using balloon kyphoplasty. Technically successful T12 vertebral body biopsy, ablation and cement augmentation using balloon kyphoplasty. Technically successful L2 vertebral body ablation and cement augmentation using balloon kyphoplasty. PLAN: The patient will be seen for clinical follow-up at the interventional radiology clinic in 2-4 weeks. Signed, Criselda Peaches, MD [facility] Radiology [facility] Radiology Electronically Signed   By: Jacqulynn Cadet M.D.   On: 05/07/2022 13:51   IR Radiologist Eval & Mgmt  Result Date: 04/23/2022 EXAM: NEW PATIENT OFFICE VISIT CHIEF COMPLAINT: SEE EPIC NOTE HISTORY OF PRESENT ILLNESS: SEE EPIC NOTE REVIEW OF SYSTEMS: SEE EPIC NOTE PHYSICAL EXAMINATION: SEE EPIC NOTE ASSESSMENT AND PLAN: SEE EPIC NOTE Electronically Signed   By: Jacqulynn Cadet M.D.   On: 04/23/2022 12:49     ASSESSMENT & PLAN:   61 y.o. very pleasant male with  1.  Recently diagnosed R-ISS stage III multiple myeloma with extensive bone metastases and patholgiic fracture rt shoulder -Recent bone marrow biopsy and aspiration done 02/06/2022 revealed hypercellular bone marrow with plasma cell neoplasm. 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  Plan -Labs done  today were reviewed in detail with the patient CBC CMP stable Myeloma panel shows increase in M spike to 0.2 g/dL -No significant new toxicities from his current treatment regimen -Continue daratumumab/carfilzomib/Revlimid/dexamethasone regimen -Continue Revlimid at 15 mg p.o. daily for 3 weeks on 1 week off with Eliquis for VTE prophylaxis -Continue current pain management with fentanyl patch and as needed oxycodone. -Continue to optimize bowel regimen -Continue monthly Zometa -Continue a Eliquis for VTE prophylaxis -Continue acyclovir for VZV prophylaxis -We will have to refer the patient to [facility]  myeloma transplant team for consideration of bone marrow transplant. Patient has completed his vertebroplasty and hopefully this will help his back pain in the next coming weeks to a month. Follow up: As per appointment orders  The total time spent in the appointment was 25 minutes*.  All of the patient's questions were answered with apparent satisfaction. The patient knows to call the clinic with any problems, questions or concerns.   Sullivan Lone MD MS AAHIVMS [facility] Hematology/Oncology Physician [facility]  .*Total Encounter Time as defined by the Centers for Medicare and Medicaid Services includes, in addition to the face-to-face time of a patient visit (documented in the note above) non-face-to-face time: obtaining and reviewing outside history, ordering and reviewing medications, tests or procedures, care coordination (communications with other health care professionals or caregivers) and documentation in the medical record.

## 2022-05-17 ENCOUNTER — Ambulatory Visit
Admission: RE | Admit: 2022-05-17 | Discharge: 2022-05-17 | Disposition: A | Discharge status: Home / Self Care | Payer: BC Managed Care – PPO | Source: Ambulatory Visit | Attending: Interventional Radiology | Admitting: Interventional Radiology

## 2022-05-17 ENCOUNTER — Telehealth: Payer: Self-pay

## 2022-05-17 ENCOUNTER — Other Ambulatory Visit: Payer: Self-pay | Admitting: Interventional Radiology

## 2022-05-17 DIAGNOSIS — M8458XA Pathological fracture in neoplastic disease, other specified site, initial encounter for fracture: Secondary | ICD-10-CM | POA: Diagnosis not present

## 2022-05-17 DIAGNOSIS — C9000 Multiple myeloma not having achieved remission: Secondary | ICD-10-CM

## 2022-05-17 HISTORY — PX: IR RADIOLOGIST EVAL & MGMT: IMG5224

## 2022-05-17 LAB — MULTIPLE MYELOMA PANEL, SERUM
Albumin SerPl Elph-Mcnc: 3.3 g/dL (ref 2.9–4.4)
Albumin/Glob SerPl: 1.6 (ref 0.7–1.7)
Alpha 1: 0.2 g/dL (ref 0.0–0.4)
Alpha2 Glob SerPl Elph-Mcnc: 0.6 g/dL (ref 0.4–1.0)
B-Globulin SerPl Elph-Mcnc: 0.8 g/dL (ref 0.7–1.3)
Gamma Glob SerPl Elph-Mcnc: 0.4 g/dL (ref 0.4–1.8)
Globulin, Total: 2.1 g/dL — ABNORMAL LOW (ref 2.2–3.9)
IgA: 81 mg/dL (ref 61–437)
IgG (Immunoglobin G), Serum: 514 mg/dL — ABNORMAL LOW (ref 603–1613)
IgM (Immunoglobulin M), Srm: 19 mg/dL — ABNORMAL LOW (ref 20–172)
M Protein SerPl Elph-Mcnc: 0.2 g/dL — ABNORMAL HIGH
Total Protein ELP: 5.4 g/dL — ABNORMAL LOW (ref 6.0–8.5)

## 2022-05-17 NOTE — Telephone Encounter (Signed)
Phone call to pt to follow up from his osteocool/kyphoplasty procedure on 04/30/22. Pt reports his pain he "thinks is better but it still hurts very bad when he stands up or sits down post procedure. Pt denies any signs of infection, redness at the site, draining or fever. Pt. Would like to be seen by Dr. Laurence Ferrari to follow up after his procedure due to still having pain 1.5 weeks out from the procedure.He will be scheduled for an in person follow up appt. With Dr. Laurence Ferrari on 05/17/22 @ 3:30. Pt advised to call back if anything were to change or any concerns arise between now and his appointment. Pt. verbalized understanding.

## 2022-05-17 NOTE — Progress Notes (Signed)
Chief Complaint: Patient was seen in consultation today for multiple myeloma with multiple pathologic compression fractures at the request of Cashtyn Pouliot K  Referring Physician(s): Dr. Irene Limbo  History of Present Illness: Anthony [name] is a 61 y.o. male with multiple myeloma who was suffering from back pain due to multiple pathologic compression fractures.  He recent;ly underwent OsteoCool at T11, T12 and L2 with concurrent biopsies of T11 and T12 on 05/07/22.   T11: A minor monoclonal plasma cell population present  T12: Bone marrow and bone with new bone formation   Findings are consistent with multiple myeloma in treatment and are as expected.  I reviewed and explained these results to him and his wife.   His pain is overall better and he feels he is standing up straighter.  He has some persistent crampy pain in the back muscles on both sides.    Past Medical History:  Diagnosis Date   Back pain    Cancer ([place])    Multiple myeloma ([place]) 02/15/2022    Past Surgical History:  Procedure Laterality Date   IR BONE TUMOR(S)RF ABLATION  05/07/2022   IR BONE TUMOR(S)RF ABLATION  05/07/2022   IR BONE TUMOR(S)RF ABLATION  05/07/2022   IR KYPHO EA ADDL LEVEL THORACIC OR LUMBAR  05/07/2022   IR KYPHO LUMBAR INC FX REDUCE BONE BX UNI/BIL CANNULATION INC/IMAGING  05/07/2022   IR KYPHO THORACIC WITH BONE BIOPSY  05/07/2022   IR RADIOLOGIST EVAL & MGMT  04/23/2022   IR RADIOLOGIST EVAL & MGMT  05/17/2022    Allergies: Patient has no known allergies.  Medications: Prior to Admission medications   Medication Sig Start Date End Date Taking? Authorizing Provider  aluminum chloride (DRYSOL) 20 % external solution Apply 1 application topically at bedtime.    [provider]  apixaban (ELIQUIS) 5 MG TABS tablet Take 1 tablet (5 mg total) by mouth 2 (two) times daily. 04/10/22   Brunetta Genera, MD  calcium-vitamin D (OSCAL WITH D) 250-125 MG-UNIT tablet Take 1 tablet by mouth  daily.    [provider]  cyclobenzaprine (FLEXERIL) 10 MG tablet Take 10 mg by mouth 3 (three) times daily as needed for muscle spasms.    [provider]  diclofenac sodium (VOLTAREN) 1 % GEL Apply 2 g topically as needed.    [provider]  dronabinol (MARINOL) 5 MG capsule Take 1 capsule (5 mg total) by mouth 2 (two) times daily before a meal. 04/30/22   Brunetta Genera, MD  ergocalciferol (VITAMIN D2) 1.25 MG (50000 UT) capsule Take 1 capsule (50,000 Units total) by mouth 2 (two) times daily. 04/03/22   Brunetta Genera, MD  esomeprazole (NEXIUM) 40 MG capsule Take 1 capsule (40 mg total) by mouth daily before breakfast. 04/23/22   Brunetta Genera, MD  fentaNYL (DURAGESIC) 25 MCG/HR Place 1 patch onto the skin every 3 (three) days. 04/24/22   Brunetta Genera, MD  furosemide (LASIX) 20 MG tablet Take 1 tablet (20 mg total) by mouth daily. Patient not taking: Reported on 04/23/2022 03/11/22   Brunetta Genera, MD  lidocaine (LIDODERM) 5 % Place 1 patch onto the skin daily. Remove & Discard patch within 12 hours or as directed by MD 03/11/22   Brunetta Genera, MD  methylPREDNISolone (MEDROL DOSEPAK) 4 MG TBPK tablet Take 6 pills by mouth day 1, 5 on day 2, 4 on day 3, 3 on day 4, 2 on day 5, 1 on day 6  03/08/22   Barrie Folk, PA-C  Multiple Vitamin (MULTIVITAMIN) tablet Take 1 tablet by mouth daily.    [provider]  Omega-3 Fatty Acids (FISH OIL) 1360 MG CAPS Take 1 capsule by mouth daily.    [provider]  ondansetron (ZOFRAN) 8 MG tablet Take 1 tablet (8 mg total) by mouth every 8 (eight) hours as needed for nausea or vomiting. Patient not taking: Reported on 04/23/2022 03/04/22   Brunetta Genera, MD  Oxycodone HCl 10 MG TABS Take 1/2 TO 1 tablet (5-10 mg total) by mouth every 4 (four) hours as needed for severe pain or moderate pain (cancer related pain). 05/15/22   Brunetta Genera, MD  REVLIMID 15 MG capsule  TAKE 1 CAPSULE DAILY FOR 14 DAYS ON AND 7 DAYS OFF 05/01/22   Brunetta Genera, MD  senna-docusate (SENNA S) 8.6-50 MG tablet Take 2 tablets by mouth at bedtime. 02/15/22   Brunetta Genera, MD  terbinafine (LAMISIL) 1 % cream Apply 1 application topically 2 (two) times daily.    [provider]  prochlorperazine (COMPAZINE) 10 MG tablet Take 1 tablet (10 mg total) by mouth every 6 (six) hours as needed (Nausea or vomiting). 02/15/22 04/01/22  Brunetta Genera, MD     Family History  Problem Relation Age of Onset   Rheum arthritis Mother    Other Sister        Pre-cancerous uterine mass;    Rheum arthritis Sister    Bone cancer Paternal Grandfather    Brain cancer Maternal Uncle    Brain cancer Maternal Aunt    Osteoporosis Neg Hx     Social History   Socioeconomic History   Marital status: Married    Spouse name: Not on file   Number of children: Not on file   Years of education: Not on file   Highest education level: Not on file  Occupational History   Not on file  Tobacco Use   Smoking status: Never   Smokeless tobacco: Former    Types: Chew, Snuff  Vaping Use   Vaping Use: Never used  Substance and Sexual Activity   Alcohol use: Yes    Alcohol/week: 2.0 standard drinks of alcohol    Types: 2 Glasses of wine per week   Drug use: No   Sexual activity: Yes  Other Topics Concern   Not on file  Social History Narrative   Not on file   Social Determinants of Health   Financial Resource Strain: Low Risk  (05/07/2022)   Overall Financial Resource Strain (CARDIA)    Difficulty of Paying Living Expenses: Not very hard  Food Insecurity: No Food Insecurity (05/07/2022)   Hunger Vital Sign    Worried About Running Out of Food in the Last Year: Never true    Ran Out of Food in the Last Year: Never true  Transportation Needs: No Transportation Needs (05/07/2022)   PRAPARE - Hydrologist (Medical): No    Lack of Transportation  (Non-Medical): No  Physical Activity: Not on file  Stress: Not on file  Social Connections: Not on file    Review of Systems: A 12 point ROS discussed and pertinent positives are indicated in the HPI above.  All other systems are negative.  Review of Systems  Vital Signs: There were no vitals taken for this visit.    Physical Exam Constitutional:      Appearance: Normal appearance.  HENT:  Head: Normocephalic and atraumatic.  Eyes:     General: No scleral icterus. Cardiovascular:     Rate and Rhythm: Normal rate.  Pulmonary:     Effort: Pulmonary effort is normal.  Musculoskeletal:     Comments: Well healed access sites, no signs of infection.   Mild TTP over the L1 spinous process.   Skin:    General: Skin is warm and dry.  Neurological:     Mental Status: He is alert and oriented to person, place, and time.  Psychiatric:        Mood and Affect: Mood normal.        Behavior: Behavior normal.       Imaging:  Labs:  CBC: Recent Labs    04/24/22 0815 04/29/22 1358 05/08/22 0908 05/14/22 0925  WBC 3.2* 5.0 5.5 3.8*  HGB 13.7 14.0 14.0 13.7  HCT 40.7 41.7 42.1 40.5  PLT 134* 153 189 119*    COAGS: No results for input(s): "INR", "APTT" in the last 8760 hours.  BMP: Recent Labs    04/15/22 1520 04/24/22 0815 05/08/22 0908 05/14/22 0925  NA 134* 136 137 135  K 4.1 4.3 4.0 4.5  CL 104 106 108 105  CO2 26 24 23 25   GLUCOSE 93 85 126* 127*  BUN 12 9 14 11   CALCIUM 9.1 8.7* 8.2* 8.2*  CREATININE 0.59* 0.80 0.78 0.75  GFRNONAA >60 >60 >60 >60    LIVER FUNCTION TESTS: Recent Labs    04/15/22 1520 04/24/22 0815 05/08/22 0908 05/14/22 0925  BILITOT 0.5 0.6 0.7 0.7  AST 13* 12* 14* 11*  ALT 22 15 14 12   ALKPHOS 97 77 75 59  PROT 6.4* 6.1* 5.8* 6.0*  ALBUMIN 3.9 3.9 3.8 3.7    TUMOR MARKERS: No results for input(s): "AFPTM", "CEA", "CA199", "CHROMGRNA" in the last 8760 hours.  Assessment and Plan:  61 yo male 2 weeks s/p three  level OsteoCool of T11, T12 and L2.  Doing better but having some muscle spasms.    1.) Methocarbamol 1500 mg QID PRN muscle spasms.  May cut back to 1000 mg after the first few days. 2.) F/U in 2 weeks    Electronically Signed: Criselda Peaches 05/17/2022, 4:07 PM   I spent a total of   25 Minutes in face to face in clinical consultation, greater than 50% of which was counseling/coordinating care for multiple myeloma and multiple pathologic compression fractures.

## 2022-05-19 ENCOUNTER — Other Ambulatory Visit: Payer: Self-pay | Admitting: Hematology

## 2022-05-19 DIAGNOSIS — C9000 Multiple myeloma not having achieved remission: Secondary | ICD-10-CM

## 2022-05-21 ENCOUNTER — Encounter: Payer: Self-pay | Admitting: Hematology

## 2022-05-21 ENCOUNTER — Inpatient Hospital Stay: Payer: BC Managed Care – PPO

## 2022-05-21 ENCOUNTER — Inpatient Hospital Stay: Payer: BC Managed Care – PPO | Attending: Physician Assistant

## 2022-05-21 ENCOUNTER — Other Ambulatory Visit: Payer: Self-pay

## 2022-05-21 ENCOUNTER — Inpatient Hospital Stay (HOSPITAL_BASED_OUTPATIENT_CLINIC_OR_DEPARTMENT_OTHER): Payer: BC Managed Care – PPO | Admitting: Physician Assistant

## 2022-05-21 ENCOUNTER — Inpatient Hospital Stay: Payer: BC Managed Care – PPO | Admitting: Physician Assistant

## 2022-05-21 DIAGNOSIS — C9000 Multiple myeloma not having achieved remission: Secondary | ICD-10-CM

## 2022-05-21 DIAGNOSIS — Z79891 Long term (current) use of opiate analgesic: Secondary | ICD-10-CM | POA: Insufficient documentation

## 2022-05-21 DIAGNOSIS — M549 Dorsalgia, unspecified: Secondary | ICD-10-CM | POA: Insufficient documentation

## 2022-05-21 DIAGNOSIS — Z7189 Other specified counseling: Secondary | ICD-10-CM | POA: Diagnosis not present

## 2022-05-21 DIAGNOSIS — Z7961 Long term (current) use of immunomodulator: Secondary | ICD-10-CM | POA: Diagnosis not present

## 2022-05-21 DIAGNOSIS — R5383 Other fatigue: Secondary | ICD-10-CM | POA: Insufficient documentation

## 2022-05-21 DIAGNOSIS — Z87891 Personal history of nicotine dependence: Secondary | ICD-10-CM | POA: Diagnosis not present

## 2022-05-21 DIAGNOSIS — Z5112 Encounter for antineoplastic immunotherapy: Secondary | ICD-10-CM | POA: Insufficient documentation

## 2022-05-21 LAB — CBC WITH DIFFERENTIAL (CANCER CENTER ONLY)
Abs Immature Granulocytes: 0.02 10*3/uL (ref 0.00–0.07)
Basophils Absolute: 0 10*3/uL (ref 0.0–0.1)
Basophils Relative: 0 %
Eosinophils Absolute: 0.4 10*3/uL (ref 0.0–0.5)
Eosinophils Relative: 6 %
HCT: 40.2 % (ref 39.0–52.0)
Hemoglobin: 13.7 g/dL (ref 13.0–17.0)
Immature Granulocytes: 0 %
Lymphocytes Relative: 15 %
Lymphs Abs: 0.9 10*3/uL (ref 0.7–4.0)
MCH: 28 pg (ref 26.0–34.0)
MCHC: 34.1 g/dL (ref 30.0–36.0)
MCV: 82.2 fL (ref 80.0–100.0)
Monocytes Absolute: 0.9 10*3/uL (ref 0.1–1.0)
Monocytes Relative: 14 %
Neutro Abs: 3.9 10*3/uL (ref 1.7–7.7)
Neutrophils Relative %: 65 %
Platelet Count: 186 10*3/uL (ref 150–400)
RBC: 4.89 MIL/uL (ref 4.22–5.81)
RDW: 14.9 % (ref 11.5–15.5)
WBC Count: 6.1 10*3/uL (ref 4.0–10.5)
nRBC: 0 % (ref 0.0–0.2)

## 2022-05-21 LAB — CMP (CANCER CENTER ONLY)
ALT: 16 U/L (ref 0–44)
AST: 12 U/L — ABNORMAL LOW (ref 15–41)
Albumin: 4 g/dL (ref 3.5–5.0)
Alkaline Phosphatase: 67 U/L (ref 38–126)
Anion gap: 5 (ref 5–15)
BUN: 11 mg/dL (ref 8–23)
CO2: 24 mmol/L (ref 22–32)
Calcium: 8.9 mg/dL (ref 8.9–10.3)
Chloride: 107 mmol/L (ref 98–111)
Creatinine: 0.75 mg/dL (ref 0.61–1.24)
GFR, Estimated: >60 mL/min (ref >60)
Glucose, Bld: 100 mg/dL — ABNORMAL HIGH (ref 70–99)
Potassium: 4.1 mmol/L (ref 3.5–5.1)
Sodium: 136 mmol/L (ref 135–145)
Total Bilirubin: 0.5 mg/dL (ref 0.3–1.2)
Total Protein: 5.7 g/dL — ABNORMAL LOW (ref 6.5–8.1)

## 2022-05-21 MED ORDER — REVLIMID 15 MG PO CAPS: ORAL_CAPSULE | ORAL | 0 refills | Status: DC

## 2022-05-21 MED FILL — Dexamethasone Sodium Phosphate Inj 100 MG/10ML: INTRAMUSCULAR | Qty: 2 | Status: AC

## 2022-05-21 NOTE — Progress Notes (Signed)
HEMATOLOGY/ONCOLOGY CLINIC NOTE  Date of Service: .05/21/2022   Patient Care Team: Lennie Odor, Anthony [name] as PCP - General (Nurse Practitioner)  CHIEF COMPLAINTS/PURPOSE OF CONSULTATION:  Multiple myeloma  INTERVAL HISTORY:  Anthony [name] is a 61 y.o. male who presents for a follow up before Cycle 2, Day 15 of Daratumumab/Carfilzomib/Revimid/Dexamethasone treatment. He is unaccompanied for this visit.   He reports his energy levels are fairly stable. He still has fatigue and requires frequent resting throughout the day. His appetite is fair and weight is stable since last visit. He denies nausea, vomiting or abdominal pain. His back pain is improving since under going osteocool procedure. His bowel habits are normal by taking stool softeners daily. He denies easy bruising or signs of bleeding. He denies fevers, chill, night sweats, shortness of breath, chest pain or cough. He has no other complaints.   MEDICAL HISTORY:  Past Medical History:  Diagnosis Date   Back pain    Cancer ([place])    Multiple myeloma ([place]) 02/15/2022    SURGICAL HISTORY: Past Surgical History:  Procedure Laterality Date   IR BONE TUMOR(S)RF ABLATION  05/07/2022   IR BONE TUMOR(S)RF ABLATION  05/07/2022   IR BONE TUMOR(S)RF ABLATION  05/07/2022   IR KYPHO EA ADDL LEVEL THORACIC OR LUMBAR  05/07/2022   IR KYPHO LUMBAR INC FX REDUCE BONE BX UNI/BIL CANNULATION INC/IMAGING  05/07/2022   IR KYPHO THORACIC WITH BONE BIOPSY  05/07/2022   IR RADIOLOGIST EVAL & MGMT  04/23/2022   IR RADIOLOGIST EVAL & MGMT  05/17/2022    SOCIAL HISTORY: Social History   Socioeconomic History   Marital status: Married    Spouse name: Not on file   Number of children: Not on file   Years of education: Not on file   Highest education level: Not on file  Occupational History   Not on file  Tobacco Use   Smoking status: Never   Smokeless tobacco: Former    Types: Chew, Snuff  Vaping Use   Vaping Use: Never used  Substance and  Sexual Activity   Alcohol use: Yes    Alcohol/week: 2.0 standard drinks of alcohol    Types: 2 Glasses of wine per week   Drug use: No   Sexual activity: Yes  Other Topics Concern   Not on file  Social History Narrative   Not on file   Social Determinants of Health   Financial Resource Strain: Low Risk  (05/07/2022)   Overall Financial Resource Strain (CARDIA)    Difficulty of Paying Living Expenses: Not very hard  Food Insecurity: No Food Insecurity (05/07/2022)   Hunger Vital Sign    Worried About Running Out of Food in the Last Year: Never true    Ran Out of Food in the Last Year: Never true  Transportation Needs: No Transportation Needs (05/07/2022)   PRAPARE - Hydrologist (Medical): No    Lack of Transportation (Non-Medical): No  Physical Activity: Not on file  Stress: Not on file  Social Connections: Not on file  Intimate Partner Violence: Unknown (05/07/2022)   Humiliation, Afraid, Rape, and Kick questionnaire    Fear of Current or Ex-Partner: No    Emotionally Abused: No    Physically Abused: No    Sexually Abused: Not on file    FAMILY HISTORY: Family History  Problem Relation Age of Onset   Rheum arthritis Mother    Other Sister  Pre-cancerous uterine mass;    Rheum arthritis Sister    Bone cancer Paternal Grandfather    Brain cancer Maternal Uncle    Brain cancer Maternal Aunt    Osteoporosis Neg Hx     ALLERGIES:  has No Known Allergies.  MEDICATIONS:  Current Outpatient Medications  Medication Sig Dispense Refill   aluminum chloride (DRYSOL) 20 % external solution Apply 1 application topically at bedtime.     apixaban (ELIQUIS) 5 MG TABS tablet Take 1 tablet (5 mg total) by mouth 2 (two) times daily. 60 tablet 5   calcium-vitamin D (OSCAL WITH D) 250-125 MG-UNIT tablet Take 1 tablet by mouth daily.     diclofenac sodium (VOLTAREN) 1 % GEL Apply 2 g topically as needed.     dronabinol (MARINOL) 5 MG capsule Take 1  capsule (5 mg total) by mouth 2 (two) times daily before a meal. 60 capsule 0   ergocalciferol (VITAMIN D2) 1.25 MG (50000 UT) capsule Take 1 capsule (50,000 Units total) by mouth 2 (two) times daily. 12 capsule 2   esomeprazole (NEXIUM) 40 MG capsule Take 1 capsule (40 mg total) by mouth daily before breakfast. 30 capsule 1   fentaNYL (DURAGESIC) 25 MCG/HR Place 1 patch onto the skin every 3 (three) days. 10 patch 0   furosemide (LASIX) 20 MG tablet Take 1 tablet (20 mg total) by mouth daily. 30 tablet 1   lidocaine (LIDODERM) 5 % Place 1 patch onto the skin daily. Remove & Discard patch within 12 hours or as directed by MD 30 patch 0   methocarbamol (ROBAXIN) 500 MG tablet SMARTSIG:3 Tablet(s) By Mouth Every 6 Hours PRN     methylPREDNISolone (MEDROL DOSEPAK) 4 MG TBPK tablet Take 6 pills by mouth day 1, 5 on day 2, 4 on day 3, 3 on day 4, 2 on day 5, 1 on day 6 21 tablet 0   Multiple Vitamin (MULTIVITAMIN) tablet Take 1 tablet by mouth daily.     Omega-3 Fatty Acids (FISH OIL) 1360 MG CAPS Take 1 capsule by mouth daily.     ondansetron (ZOFRAN) 8 MG tablet Take 1 tablet (8 mg total) by mouth every 8 (eight) hours as needed for nausea or vomiting. 30 tablet 0   Oxycodone HCl 10 MG TABS Take 1/2 TO 1 tablet (5-10 mg total) by mouth every 4 (four) hours as needed for severe pain or moderate pain (cancer related pain). 30 tablet 0   REVLIMID 15 MG capsule TAKE 1 CAPSULE DAILY FOR 14 DAYS ON AND 7 DAYS OFF 14 capsule 0   senna-docusate (SENNA S) 8.6-50 MG tablet Take 2 tablets by mouth at bedtime. 60 tablet 1   terbinafine (LAMISIL) 1 % cream Apply 1 application topically 2 (two) times daily.     cyclobenzaprine (FLEXERIL) 10 MG tablet Take 10 mg by mouth 3 (three) times daily as needed for muscle spasms. (Patient not taking: Reported on 05/21/2022)     No current facility-administered medications for this visit.    10 Point review of Systems was done is negative except as noted above.  PHYSICAL  EXAMINATION: Vitals:   05/21/22 1451  BP: 132/78  Pulse: 87  Resp: 17  Temp: 98.1 F (36.7 C)  SpO2: 99%   NAD GENERAL:alert, in no acute distress and comfortable SKIN: no acute rashes, no significant lesions EYES: conjunctiva are pink and non-injected, sclera anicteric NECK: supple, no JVD LUNGS: clear to auscultation b/l with normal respiratory effort HEART: regular rate & rhythm  Extremity: no pedal edema PSYCH: alert & oriented x 3 with fluent speech NEURO: no focal motor/sensory deficits    LABORATORY DATA:  I have reviewed the data as listed .    Latest Ref Rng & Units 05/21/2022    2:28 PM 05/14/2022    9:25 AM 05/08/2022    9:08 AM  CBC  WBC 4.0 - 10.5 K/uL 6.1  3.8  5.5   Hemoglobin 13.0 - 17.0 g/dL 13.7  13.7  14.0   Hematocrit 39.0 - 52.0 % 40.2  40.5  42.1   Platelets 150 - 400 K/uL 186  119  189       Latest Ref Rng & Units 05/21/2022    2:28 PM 05/14/2022    9:25 AM 05/08/2022    9:08 AM  CMP  Glucose 70 - 99 mg/dL 100  127  126   BUN 8 - 23 mg/dL _0 Creatinine 0.61 - 1.24 mg/dL 0.75  0.75  0.78   Sodium 135 - 145 mmol/L 136  135  137   Potassium 3.5 - 5.1 mmol/L 4.1  4.5  4.0   Chloride 98 - 111 mmol/L 107  105  108   CO2 22 - 32 mmol/L _1 Calcium 8.9 - 10.3 mg/dL 8.9  8.2  8.2   Total Protein 6.5 - 8.1 g/dL 5.7  6.0  5.8   Total Bilirubin 0.3 - 1.2 mg/dL 0.5  0.7  0.7   Alkaline Phos 38 - 126 U/L 67  59  75   AST 15 - 41 U/L _2 ALT 0 - 44 U/L _3 02/06/2022 Molecular pathology   RADIOGRAPHIC STUDIES: I have personally reviewed the radiological images as listed and agreed with the findings in the report. IR Radiologist Eval & Mgmt  Result Date: 05/17/2022 EXAM: NEW PATIENT OFFICE VISIT CHIEF COMPLAINT: SEE EPIC NOTE HISTORY OF PRESENT ILLNESS: SEE EPIC NOTE REVIEW OF SYSTEMS: SEE EPIC NOTE PHYSICAL EXAMINATION: SEE EPIC NOTE ASSESSMENT AND PLAN: SEE EPIC NOTE Electronically Signed   By: Jacqulynn Cadet  M.D.   On: 05/17/2022 15:41   IR KYPHO LUMBAR INC FX REDUCE BONE BX UNI/BIL CANNULATION INC/IMAGING  Result Date: 05/07/2022 CLINICAL DATA:  61 year old male with a history of multiple myeloma and multiple painful pathologic compression fractures with delayed healing. At the time of initial consultation, symptomatic compression fractures were identified at T12 and L2. However, on the day of the procedure, fluoroscopic imaging for operative site localization revealed a new significant pathologic compression fracture at T11 as well. The patient and his wife confirmed increasing symptoms in this region. Therefore, we elected to proceed with combined radiofrequency ablation and cement augmentation with balloon kyphoplasty at all 3 affected levels. W97.94IA Pathological fracture in neoplastic disease, other specified site, initial encounter for fracture C90.00* Multiple myeloma not having achieved remission EXAM: FLUOROSCOPIC GUIDED T11 VERTEBRAL BODY BIOPSY RF ABLATION AND KYPHOPLASTY/CEMENT AUGMENTATION. FLUOROSCOPIC GUIDED T12 VERTEBRAL BODY BIOPSY RF ABLATION AND KYPHOPLASTY/CEMENT AUGMENTATION. FLUOROSCOPIC GUIDED L2 VERTEBRAL BODY BIOPSY RF ABLATION AND KYPHOPLASTY/CEMENT AUGMENTATION. COMPARISON:  None Available. MEDICATIONS: Ancef 2 g IV; The antibiotic was administered in an appropriate time interval prior to needle puncture of the skin. ANESTHESIA/SEDATION: Versed 8 mg IV; Fentanyl 300 mcg IV Moderate Sedation Time: 90 minutes; The patient's vital signs and level of consciousness were continuously monitored during the procedure by the interventional radiology  nurse under my direct supervision. FLUOROSCOPY TIME:  Radiation exposure index: 201.1 mGy air kerma COMPLICATIONS: None immediate. TECHNIQUE: Informed written consent was obtained from the patient after a thorough discussion of the procedural risks, benefits and alternatives. All questions were addressed. Maximal Sterile Barrier Technique was utilized  including caps, mask, sterile gowns, sterile gloves, sterile drape, hand hygiene and skin antiseptic. A timeout was performed prior to the initiation of the procedure. The patient was placed prone on the fluoroscopic table. The skin overlying the upper thoracic region was then prepped and draped in the usual sterile fashion. Maximal barrier sterile technique was utilized including caps, mask, sterile gowns, sterile gloves, sterile drape, hand hygiene and skin antiseptic. Intravenous Fentanyl and Versed were administered as conscious sedation during continuous cardiorespiratory monitoring by the radiology RN. T11 The left pedicle at T11 was then infiltrated with 1% lidocaine followed by the advancement of a Kyphon trocar needle through the left pedicle into the posterior one-third of the vertebral body. A biopsy specimen was then obtained. Subsequently, the osteo drill was advanced to the anterior third of the vertebral body. The osteo drill was retracted. Through the working cannula, a 15 mm OsteoCool RF ablation probe was inserted and positioned under fluoroscopic guidance. In similar fashion, the right T11 pedicle was infiltrated with 1% lidocaine. Utilizing a extra pedicular approach, a second Kyphon trocar needle was advanced into the posterior third of the vertebral body. A biopsy specimen was then obtained. Subsequently, the osteo drill was coaxially advanced to the anterior right third. The osteo drill was exchanged for a 15 mm OsteoCool RF ablation probe which was positioned under fluoroscopic guidance. With both OsteoCool ablation probes in place, the ablation was performed for 11.5 minutes. Attention was now paid towards the kyphoplasty portion of the procedure. Beginning at the T11 vertebral body level, a Kyphon inflatable bone tamp 15 x 2.5 was advanced through both working cannulas and positioned with the distal marker approximately 5 mm from the anterior aspect of the cortex. Appropriate positioning  was confirmed on the AP projection. At this time, the balloon was expanded using contrast via a Kyphon inflation syringe device via micro tubing. Inflations were continued under direct fluoroscopic guidance. T12 The left pedicle at T12 was then infiltrated with 1% lidocaine followed by the advancement of a Kyphon trocar needle through the left pedicle into the posterior one-third of the vertebral body. A biopsy specimen was then obtained. Subsequently, the osteo drill was advanced to the anterior third of the vertebral body. The osteo drill was retracted. Through the working cannula, a 15 mm OsteoCool RF ablation probe was inserted and positioned under fluoroscopic guidance. In similar fashion, the right T12 pedicle was infiltrated with 1% lidocaine. Utilizing a extra pedicular approach, a second Kyphon trocar needle was advanced into the posterior third of the vertebral body. A biopsy specimen was then obtained. Subsequently, the osteo drill was coaxially advanced to the anterior right third. The osteo drill was exchanged for a 15 mm OsteoCool RF ablation probe which was positioned under fluoroscopic guidance. With both OsteoCool ablation probes in place, the ablation was performed for 11.5 minutes. Attention was now paid towards the kyphoplasty portion of the procedure. Beginning at the vertebral body level, a Kyphon inflatable bone tamp 15 x 2.5 was advanced through both working cannulas and positioned with the distal marker approximately 5 mm from the anterior aspect of the cortex. Appropriate positioning was confirmed on the AP projection. At this time, the balloon was expanded using  contrast via a Kyphon inflation syringe device via micro tubing. Inflations were continued under direct fluoroscopic guidance. L2 The left pedicle at L2 was then infiltrated with 1% lidocaine followed by the advancement of a Kyphon trocar needle through the left pedicle into the posterior one-third of the vertebral body.  Subsequently, the osteo drill was advanced to the anterior third of the vertebral body. The osteo drill was retracted. Through the working cannula, a 15 mm OsteoCool RF ablation probe was inserted and positioned under fluoroscopic guidance. In similar fashion, the right L2 pedicle was infiltrated with 1% lidocaine. Utilizing a extra pedicular approach, a second Kyphon trocar needle was advanced into the posterior third of the vertebral body. Subsequently, the osteo drill was coaxially advanced to the anterior right third. The osteo drill was exchanged for a 15 mm OsteoCool RF ablation probe which was positioned under fluoroscopic guidance. With both OsteoCool ablation probes in place, the ablation was performed for 11.5 minutes. Attention was now paid towards the kyphoplasty portion of the procedure. Beginning at the L2 vertebral body level, a Kyphon inflatable bone tamp 15 x 2.5 was advanced through both working cannulas and positioned with the distal marker approximately 5 mm from the anterior aspect of the cortex. Appropriate positioning was confirmed on the AP projection. At this time, the balloon was expanded using contrast via a Kyphon inflation syringe device via micro tubing. Inflations were continued under direct fluoroscopic guidance. At this time, methylmethacrylate mixture was reconstituted in the Kyphon bone mixing device system. This was then loaded into the delivery mechanism, attached to Kyphon bone fillers and the cement delivery system. The balloons were deflated and removed followed by the instillation of methylmethacrylate mixture with excellent filling in the AP and lateral projections. The working cannulae and the bone filler were then retrieved and removed. Multiple spot radiographic images were obtained in various obliquities. Hemostasis was achieved with manual compression. The patient tolerated the procedure well without immediate postprocedural complication. FINDINGS: Completion images  demonstrate a technically excellent result with adequate cement filling of the vertebral bodies on both the AP and lateral projections. No extravasation was noted in the disk spaces or posteriorly into the spinal canal. No epidural venous contamination was seen. IMPRESSION: Technically successful T11 vertebral body biopsy, ablation and cement augmentation using balloon kyphoplasty. Technically successful T12 vertebral body biopsy, ablation and cement augmentation using balloon kyphoplasty. Technically successful L2 vertebral body ablation and cement augmentation using balloon kyphoplasty. PLAN: The patient will be seen for clinical follow-up at the interventional radiology clinic in 2-4 weeks. Signed, Criselda Peaches, MD [facility] Radiology Electronically Signed   By: Jacqulynn Cadet M.D.   On: 05/07/2022 13:51   IR KYPHO EA ADDL LEVEL THORACIC OR LUMBAR  Result Date: 05/07/2022 CLINICAL DATA:  61 year old male with a history of multiple myeloma and multiple painful pathologic compression fractures with delayed healing. At the time of initial consultation, symptomatic compression fractures were identified at T12 and L2. However, on the day of the procedure, fluoroscopic imaging for operative site localization revealed a new significant pathologic compression fracture at T11 as well. The patient and his wife confirmed increasing symptoms in this region. Therefore, we elected to proceed with combined radiofrequency ablation and cement augmentation with balloon kyphoplasty at all 3 affected levels. B34.19FX Pathological fracture in neoplastic disease, other specified site, initial encounter for fracture C90.00* Multiple myeloma not having achieved remission EXAM: FLUOROSCOPIC GUIDED T11 VERTEBRAL BODY BIOPSY RF ABLATION AND KYPHOPLASTY/CEMENT AUGMENTATION. FLUOROSCOPIC  GUIDED T12 VERTEBRAL BODY BIOPSY RF ABLATION AND KYPHOPLASTY/CEMENT AUGMENTATION.  FLUOROSCOPIC GUIDED L2 VERTEBRAL BODY BIOPSY RF ABLATION AND KYPHOPLASTY/CEMENT AUGMENTATION. COMPARISON:  None Available. MEDICATIONS: Ancef 2 g IV; The antibiotic was administered in an appropriate time interval prior to needle puncture of the skin. ANESTHESIA/SEDATION: Versed 8 mg IV; Fentanyl 300 mcg IV Moderate Sedation Time: 90 minutes; The patient's vital signs and level of consciousness were continuously monitored during the procedure by the interventional radiology nurse under my direct supervision. FLUOROSCOPY TIME:  Radiation exposure index: 201.1 mGy air kerma COMPLICATIONS: None immediate. TECHNIQUE: Informed written consent was obtained from the patient after a thorough discussion of the procedural risks, benefits and alternatives. All questions were addressed. Maximal Sterile Barrier Technique was utilized including caps, mask, sterile gowns, sterile gloves, sterile drape, hand hygiene and skin antiseptic. A timeout was performed prior to the initiation of the procedure. The patient was placed prone on the fluoroscopic table. The skin overlying the upper thoracic region was then prepped and draped in the usual sterile fashion. Maximal barrier sterile technique was utilized including caps, mask, sterile gowns, sterile gloves, sterile drape, hand hygiene and skin antiseptic. Intravenous Fentanyl and Versed were administered as conscious sedation during continuous cardiorespiratory monitoring by the radiology RN. T11 The left pedicle at T11 was then infiltrated with 1% lidocaine followed by the advancement of a Kyphon trocar needle through the left pedicle into the posterior one-third of the vertebral body. A biopsy specimen was then obtained. Subsequently, the osteo drill was advanced to the anterior third of the vertebral body. The osteo drill was retracted. Through the working cannula, a 15 mm OsteoCool RF ablation probe was inserted and positioned under fluoroscopic guidance. In similar fashion, the  right T11 pedicle was infiltrated with 1% lidocaine. Utilizing a extra pedicular approach, a second Kyphon trocar needle was advanced into the posterior third of the vertebral body. A biopsy specimen was then obtained. Subsequently, the osteo drill was coaxially advanced to the anterior right third. The osteo drill was exchanged for a 15 mm OsteoCool RF ablation probe which was positioned under fluoroscopic guidance. With both OsteoCool ablation probes in place, the ablation was performed for 11.5 minutes. Attention was now paid towards the kyphoplasty portion of the procedure. Beginning at the T11 vertebral body level, a Kyphon inflatable bone tamp 15 x 2.5 was advanced through both working cannulas and positioned with the distal marker approximately 5 mm from the anterior aspect of the cortex. Appropriate positioning was confirmed on the AP projection. At this time, the balloon was expanded using contrast via a Kyphon inflation syringe device via micro tubing. Inflations were continued under direct fluoroscopic guidance. T12 The left pedicle at T12 was then infiltrated with 1% lidocaine followed by the advancement of a Kyphon trocar needle through the left pedicle into the posterior one-third of the vertebral body. A biopsy specimen was then obtained. Subsequently, the osteo drill was advanced to the anterior third of the vertebral body. The osteo drill was retracted. Through the working cannula, a 15 mm OsteoCool RF ablation probe was inserted and positioned under fluoroscopic guidance. In similar fashion, the right T12 pedicle was infiltrated with 1% lidocaine. Utilizing a extra pedicular approach, a second Kyphon trocar needle was advanced into the posterior third of the vertebral body. A biopsy specimen was then obtained. Subsequently, the osteo drill was coaxially advanced to the anterior right third. The osteo drill was exchanged for a 15 mm OsteoCool RF ablation probe which was positioned under fluoroscopic  guidance. With both OsteoCool ablation probes in place, the ablation was performed for 11.5 minutes. Attention was now paid towards the kyphoplasty portion of the procedure. Beginning at the vertebral body level, a Kyphon inflatable bone tamp 15 x 2.5 was advanced through both working cannulas and positioned with the distal marker approximately 5 mm from the anterior aspect of the cortex. Appropriate positioning was confirmed on the AP projection. At this time, the balloon was expanded using contrast via a Kyphon inflation syringe device via micro tubing. Inflations were continued under direct fluoroscopic guidance. L2 The left pedicle at L2 was then infiltrated with 1% lidocaine followed by the advancement of a Kyphon trocar needle through the left pedicle into the posterior one-third of the vertebral body. Subsequently, the osteo drill was advanced to the anterior third of the vertebral body. The osteo drill was retracted. Through the working cannula, a 15 mm OsteoCool RF ablation probe was inserted and positioned under fluoroscopic guidance. In similar fashion, the right L2 pedicle was infiltrated with 1% lidocaine. Utilizing a extra pedicular approach, a second Kyphon trocar needle was advanced into the posterior third of the vertebral body. Subsequently, the osteo drill was coaxially advanced to the anterior right third. The osteo drill was exchanged for a 15 mm OsteoCool RF ablation probe which was positioned under fluoroscopic guidance. With both OsteoCool ablation probes in place, the ablation was performed for 11.5 minutes. Attention was now paid towards the kyphoplasty portion of the procedure. Beginning at the L2 vertebral body level, a Kyphon inflatable bone tamp 15 x 2.5 was advanced through both working cannulas and positioned with the distal marker approximately 5 mm from the anterior aspect of the cortex. Appropriate positioning was confirmed on the AP projection. At this time, the balloon was  expanded using contrast via a Kyphon inflation syringe device via micro tubing. Inflations were continued under direct fluoroscopic guidance. At this time, methylmethacrylate mixture was reconstituted in the Kyphon bone mixing device system. This was then loaded into the delivery mechanism, attached to Kyphon bone fillers and the cement delivery system. The balloons were deflated and removed followed by the instillation of methylmethacrylate mixture with excellent filling in the AP and lateral projections. The working cannulae and the bone filler were then retrieved and removed. Multiple spot radiographic images were obtained in various obliquities. Hemostasis was achieved with manual compression. The patient tolerated the procedure well without immediate postprocedural complication. FINDINGS: Completion images demonstrate a technically excellent result with adequate cement filling of the vertebral bodies on both the AP and lateral projections. No extravasation was noted in the disk spaces or posteriorly into the spinal canal. No epidural venous contamination was seen. IMPRESSION: Technically successful T11 vertebral body biopsy, ablation and cement augmentation using balloon kyphoplasty. Technically successful T12 vertebral body biopsy, ablation and cement augmentation using balloon kyphoplasty. Technically successful L2 vertebral body ablation and cement augmentation using balloon kyphoplasty. PLAN: The patient will be seen for clinical follow-up at the interventional radiology clinic in 2-4 weeks. Signed, Criselda Peaches, MD [facility] Radiology Electronically Signed   By: Jacqulynn Cadet M.D.   On: 05/07/2022 13:51   IR Bone Tumor(s)RF Ablation  Result Date: 05/07/2022 CLINICAL DATA:  61 year old male with a history of multiple myeloma and multiple painful pathologic compression fractures with delayed healing. At the time of initial consultation,  symptomatic compression fractures were identified at T12 and L2. However, on the day of the procedure, fluoroscopic imaging for operative site localization revealed  a new significant pathologic compression fracture at T11 as well. The patient and his wife confirmed increasing symptoms in this region. Therefore, we elected to proceed with combined radiofrequency ablation and cement augmentation with balloon kyphoplasty at all 3 affected levels. [id number] Pathological fracture in neoplastic disease, other specified site, initial encounter for fracture C90.00* Multiple myeloma not having achieved remission EXAM: FLUOROSCOPIC GUIDED T11 VERTEBRAL BODY BIOPSY RF ABLATION AND KYPHOPLASTY/CEMENT AUGMENTATION. FLUOROSCOPIC GUIDED T12 VERTEBRAL BODY BIOPSY RF ABLATION AND KYPHOPLASTY/CEMENT AUGMENTATION. FLUOROSCOPIC GUIDED L2 VERTEBRAL BODY BIOPSY RF ABLATION AND KYPHOPLASTY/CEMENT AUGMENTATION. COMPARISON:  None Available. MEDICATIONS: Ancef 2 g IV; The antibiotic was administered in an appropriate time interval prior to needle puncture of the skin. ANESTHESIA/SEDATION: Versed 8 mg IV; Fentanyl 300 mcg IV Moderate Sedation Time: 90 minutes; The patient's vital signs and level of consciousness were continuously monitored during the procedure by the interventional radiology nurse under my direct supervision. FLUOROSCOPY TIME:  Radiation exposure index: 201.1 mGy air kerma COMPLICATIONS: None immediate. TECHNIQUE: Informed written consent was obtained from the patient after a thorough discussion of the procedural risks, benefits and alternatives. All questions were addressed. Maximal Sterile Barrier Technique was utilized including caps, mask, sterile gowns, sterile gloves, sterile drape, hand hygiene and skin antiseptic. A timeout was performed prior to the initiation of the procedure. The patient was placed prone on the fluoroscopic table. The skin overlying the upper thoracic region was then prepped and draped in the usual  sterile fashion. Maximal barrier sterile technique was utilized including caps, mask, sterile gowns, sterile gloves, sterile drape, hand hygiene and skin antiseptic. Intravenous Fentanyl and Versed were administered as conscious sedation during continuous cardiorespiratory monitoring by the radiology RN. T11 The left pedicle at T11 was then infiltrated with 1% lidocaine followed by the advancement of a Kyphon trocar needle through the left pedicle into the posterior one-third of the vertebral body. A biopsy specimen was then obtained. Subsequently, the osteo drill was advanced to the anterior third of the vertebral body. The osteo drill was retracted. Through the working cannula, a 15 mm OsteoCool RF ablation probe was inserted and positioned under fluoroscopic guidance. In similar fashion, the right T11 pedicle was infiltrated with 1% lidocaine. Utilizing a extra pedicular approach, a second Kyphon trocar needle was advanced into the posterior third of the vertebral body. A biopsy specimen was then obtained. Subsequently, the osteo drill was coaxially advanced to the anterior right third. The osteo drill was exchanged for a 15 mm OsteoCool RF ablation probe which was positioned under fluoroscopic guidance. With both OsteoCool ablation probes in place, the ablation was performed for 11.5 minutes. Attention was now paid towards the kyphoplasty portion of the procedure. Beginning at the T11 vertebral body level, a Kyphon inflatable bone tamp 15 x 2.5 was advanced through both working cannulas and positioned with the distal marker approximately 5 mm from the anterior aspect of the cortex. Appropriate positioning was confirmed on the AP projection. At this time, the balloon was expanded using contrast via a Kyphon inflation syringe device via micro tubing. Inflations were continued under direct fluoroscopic guidance. T12 The left pedicle at T12 was then infiltrated with 1% lidocaine followed by the advancement of a  Kyphon trocar needle through the left pedicle into the posterior one-third of the vertebral body. A biopsy specimen was then obtained. Subsequently, the osteo drill was advanced to the anterior third of the vertebral body. The osteo drill was retracted. Through the working cannula, a 15 mm OsteoCool RF ablation probe was  inserted and positioned under fluoroscopic guidance. In similar fashion, the right T12 pedicle was infiltrated with 1% lidocaine. Utilizing a extra pedicular approach, a second Kyphon trocar needle was advanced into the posterior third of the vertebral body. A biopsy specimen was then obtained. Subsequently, the osteo drill was coaxially advanced to the anterior right third. The osteo drill was exchanged for a 15 mm OsteoCool RF ablation probe which was positioned under fluoroscopic guidance. With both OsteoCool ablation probes in place, the ablation was performed for 11.5 minutes. Attention was now paid towards the kyphoplasty portion of the procedure. Beginning at the vertebral body level, a Kyphon inflatable bone tamp 15 x 2.5 was advanced through both working cannulas and positioned with the distal marker approximately 5 mm from the anterior aspect of the cortex. Appropriate positioning was confirmed on the AP projection. At this time, the balloon was expanded using contrast via a Kyphon inflation syringe device via micro tubing. Inflations were continued under direct fluoroscopic guidance. L2 The left pedicle at L2 was then infiltrated with 1% lidocaine followed by the advancement of a Kyphon trocar needle through the left pedicle into the posterior one-third of the vertebral body. Subsequently, the osteo drill was advanced to the anterior third of the vertebral body. The osteo drill was retracted. Through the working cannula, a 15 mm OsteoCool RF ablation probe was inserted and positioned under fluoroscopic guidance. In similar fashion, the right L2 pedicle was infiltrated with 1% lidocaine.  Utilizing a extra pedicular approach, a second Kyphon trocar needle was advanced into the posterior third of the vertebral body. Subsequently, the osteo drill was coaxially advanced to the anterior right third. The osteo drill was exchanged for a 15 mm OsteoCool RF ablation probe which was positioned under fluoroscopic guidance. With both OsteoCool ablation probes in place, the ablation was performed for 11.5 minutes. Attention was now paid towards the kyphoplasty portion of the procedure. Beginning at the L2 vertebral body level, a Kyphon inflatable bone tamp 15 x 2.5 was advanced through both working cannulas and positioned with the distal marker approximately 5 mm from the anterior aspect of the cortex. Appropriate positioning was confirmed on the AP projection. At this time, the balloon was expanded using contrast via a Kyphon inflation syringe device via micro tubing. Inflations were continued under direct fluoroscopic guidance. At this time, methylmethacrylate mixture was reconstituted in the Kyphon bone mixing device system. This was then loaded into the delivery mechanism, attached to Kyphon bone fillers and the cement delivery system. The balloons were deflated and removed followed by the instillation of methylmethacrylate mixture with excellent filling in the AP and lateral projections. The working cannulae and the bone filler were then retrieved and removed. Multiple spot radiographic images were obtained in various obliquities. Hemostasis was achieved with manual compression. The patient tolerated the procedure well without immediate postprocedural complication. FINDINGS: Completion images demonstrate a technically excellent result with adequate cement filling of the vertebral bodies on both the AP and lateral projections. No extravasation was noted in the disk spaces or posteriorly into the spinal canal. No epidural venous contamination was seen. IMPRESSION: Technically successful T11 vertebral body  biopsy, ablation and cement augmentation using balloon kyphoplasty. Technically successful T12 vertebral body biopsy, ablation and cement augmentation using balloon kyphoplasty. Technically successful L2 vertebral body ablation and cement augmentation using balloon kyphoplasty. PLAN: The patient will be seen for clinical follow-up at the interventional radiology clinic in 2-4 weeks. Signed, Criselda Peaches, MD [facility]  Radiology Electronically Signed   By: Jacqulynn Cadet M.D.   On: 05/07/2022 13:51   IR Bone Tumor(s)RF Ablation  Result Date: 05/07/2022 CLINICAL DATA:  61 year old male with a history of multiple myeloma and multiple painful pathologic compression fractures with delayed healing. At the time of initial consultation, symptomatic compression fractures were identified at T12 and L2. However, on the day of the procedure, fluoroscopic imaging for operative site localization revealed a new significant pathologic compression fracture at T11 as well. The patient and his wife confirmed increasing symptoms in this region. Therefore, we elected to proceed with combined radiofrequency ablation and cement augmentation with balloon kyphoplasty at all 3 affected levels. L89.37DS Pathological fracture in neoplastic disease, other specified site, initial encounter for fracture C90.00* Multiple myeloma not having achieved remission EXAM: FLUOROSCOPIC GUIDED T11 VERTEBRAL BODY BIOPSY RF ABLATION AND KYPHOPLASTY/CEMENT AUGMENTATION. FLUOROSCOPIC GUIDED T12 VERTEBRAL BODY BIOPSY RF ABLATION AND KYPHOPLASTY/CEMENT AUGMENTATION. FLUOROSCOPIC GUIDED L2 VERTEBRAL BODY BIOPSY RF ABLATION AND KYPHOPLASTY/CEMENT AUGMENTATION. COMPARISON:  None Available. MEDICATIONS: Ancef 2 g IV; The antibiotic was administered in an appropriate time interval prior to needle puncture of the skin. ANESTHESIA/SEDATION: Versed 8 mg IV; Fentanyl 300 mcg IV Moderate Sedation Time: 90  minutes; The patient's vital signs and level of consciousness were continuously monitored during the procedure by the interventional radiology nurse under my direct supervision. FLUOROSCOPY TIME:  Radiation exposure index: 201.1 mGy air kerma COMPLICATIONS: None immediate. TECHNIQUE: Informed written consent was obtained from the patient after a thorough discussion of the procedural risks, benefits and alternatives. All questions were addressed. Maximal Sterile Barrier Technique was utilized including caps, mask, sterile gowns, sterile gloves, sterile drape, hand hygiene and skin antiseptic. A timeout was performed prior to the initiation of the procedure. The patient was placed prone on the fluoroscopic table. The skin overlying the upper thoracic region was then prepped and draped in the usual sterile fashion. Maximal barrier sterile technique was utilized including caps, mask, sterile gowns, sterile gloves, sterile drape, hand hygiene and skin antiseptic. Intravenous Fentanyl and Versed were administered as conscious sedation during continuous cardiorespiratory monitoring by the radiology RN. T11 The left pedicle at T11 was then infiltrated with 1% lidocaine followed by the advancement of a Kyphon trocar needle through the left pedicle into the posterior one-third of the vertebral body. A biopsy specimen was then obtained. Subsequently, the osteo drill was advanced to the anterior third of the vertebral body. The osteo drill was retracted. Through the working cannula, a 15 mm OsteoCool RF ablation probe was inserted and positioned under fluoroscopic guidance. In similar fashion, the right T11 pedicle was infiltrated with 1% lidocaine. Utilizing a extra pedicular approach, a second Kyphon trocar needle was advanced into the posterior third of the vertebral body. A biopsy specimen was then obtained. Subsequently, the osteo drill was coaxially advanced to the anterior right third. The osteo drill was exchanged for a  15 mm OsteoCool RF ablation probe which was positioned under fluoroscopic guidance. With both OsteoCool ablation probes in place, the ablation was performed for 11.5 minutes. Attention was now paid towards the kyphoplasty portion of the procedure. Beginning at the T11 vertebral body level, a Kyphon inflatable bone tamp 15 x 2.5 was advanced through both working cannulas and positioned with the distal marker approximately 5 mm from the anterior aspect of the cortex. Appropriate positioning was confirmed on the AP projection. At this time, the balloon was expanded using contrast via a Kyphon inflation syringe device via micro tubing. Inflations were continued  under direct fluoroscopic guidance. T12 The left pedicle at T12 was then infiltrated with 1% lidocaine followed by the advancement of a Kyphon trocar needle through the left pedicle into the posterior one-third of the vertebral body. A biopsy specimen was then obtained. Subsequently, the osteo drill was advanced to the anterior third of the vertebral body. The osteo drill was retracted. Through the working cannula, a 15 mm OsteoCool RF ablation probe was inserted and positioned under fluoroscopic guidance. In similar fashion, the right T12 pedicle was infiltrated with 1% lidocaine. Utilizing a extra pedicular approach, a second Kyphon trocar needle was advanced into the posterior third of the vertebral body. A biopsy specimen was then obtained. Subsequently, the osteo drill was coaxially advanced to the anterior right third. The osteo drill was exchanged for a 15 mm OsteoCool RF ablation probe which was positioned under fluoroscopic guidance. With both OsteoCool ablation probes in place, the ablation was performed for 11.5 minutes. Attention was now paid towards the kyphoplasty portion of the procedure. Beginning at the vertebral body level, a Kyphon inflatable bone tamp 15 x 2.5 was advanced through both working cannulas and positioned with the distal marker  approximately 5 mm from the anterior aspect of the cortex. Appropriate positioning was confirmed on the AP projection. At this time, the balloon was expanded using contrast via a Kyphon inflation syringe device via micro tubing. Inflations were continued under direct fluoroscopic guidance. L2 The left pedicle at L2 was then infiltrated with 1% lidocaine followed by the advancement of a Kyphon trocar needle through the left pedicle into the posterior one-third of the vertebral body. Subsequently, the osteo drill was advanced to the anterior third of the vertebral body. The osteo drill was retracted. Through the working cannula, a 15 mm OsteoCool RF ablation probe was inserted and positioned under fluoroscopic guidance. In similar fashion, the right L2 pedicle was infiltrated with 1% lidocaine. Utilizing a extra pedicular approach, a second Kyphon trocar needle was advanced into the posterior third of the vertebral body. Subsequently, the osteo drill was coaxially advanced to the anterior right third. The osteo drill was exchanged for a 15 mm OsteoCool RF ablation probe which was positioned under fluoroscopic guidance. With both OsteoCool ablation probes in place, the ablation was performed for 11.5 minutes. Attention was now paid towards the kyphoplasty portion of the procedure. Beginning at the L2 vertebral body level, a Kyphon inflatable bone tamp 15 x 2.5 was advanced through both working cannulas and positioned with the distal marker approximately 5 mm from the anterior aspect of the cortex. Appropriate positioning was confirmed on the AP projection. At this time, the balloon was expanded using contrast via a Kyphon inflation syringe device via micro tubing. Inflations were continued under direct fluoroscopic guidance. At this time, methylmethacrylate mixture was reconstituted in the Kyphon bone mixing device system. This was then loaded into the delivery mechanism, attached to Kyphon bone fillers and the cement  delivery system. The balloons were deflated and removed followed by the instillation of methylmethacrylate mixture with excellent filling in the AP and lateral projections. The working cannulae and the bone filler were then retrieved and removed. Multiple spot radiographic images were obtained in various obliquities. Hemostasis was achieved with manual compression. The patient tolerated the procedure well without immediate postprocedural complication. FINDINGS: Completion images demonstrate a technically excellent result with adequate cement filling of the vertebral bodies on both the AP and lateral projections. No extravasation was noted in the disk spaces or posteriorly into the spinal  canal. No epidural venous contamination was seen. IMPRESSION: Technically successful T11 vertebral body biopsy, ablation and cement augmentation using balloon kyphoplasty. Technically successful T12 vertebral body biopsy, ablation and cement augmentation using balloon kyphoplasty. Technically successful L2 vertebral body ablation and cement augmentation using balloon kyphoplasty. PLAN: The patient will be seen for clinical follow-up at the interventional radiology clinic in 2-4 weeks. Signed, Criselda Peaches, MD [facility] Radiology Electronically Signed   By: Jacqulynn Cadet M.D.   On: 05/07/2022 13:51   IR KYPHO THORACIC WITH BONE BIOPSY  Result Date: 05/07/2022 CLINICAL DATA:  61 year old male with a history of multiple myeloma and multiple painful pathologic compression fractures with delayed healing. At the time of initial consultation, symptomatic compression fractures were identified at T12 and L2. However, on the day of the procedure, fluoroscopic imaging for operative site localization revealed a new significant pathologic compression fracture at T11 as well. The patient and his wife confirmed increasing symptoms in this region. Therefore, we elected to proceed  with combined radiofrequency ablation and cement augmentation with balloon kyphoplasty at all 3 affected levels. T88.82CM Pathological fracture in neoplastic disease, other specified site, initial encounter for fracture C90.00* Multiple myeloma not having achieved remission EXAM: FLUOROSCOPIC GUIDED T11 VERTEBRAL BODY BIOPSY RF ABLATION AND KYPHOPLASTY/CEMENT AUGMENTATION. FLUOROSCOPIC GUIDED T12 VERTEBRAL BODY BIOPSY RF ABLATION AND KYPHOPLASTY/CEMENT AUGMENTATION. FLUOROSCOPIC GUIDED L2 VERTEBRAL BODY BIOPSY RF ABLATION AND KYPHOPLASTY/CEMENT AUGMENTATION. COMPARISON:  None Available. MEDICATIONS: Ancef 2 g IV; The antibiotic was administered in an appropriate time interval prior to needle puncture of the skin. ANESTHESIA/SEDATION: Versed 8 mg IV; Fentanyl 300 mcg IV Moderate Sedation Time: 90 minutes; The patient's vital signs and level of consciousness were continuously monitored during the procedure by the interventional radiology nurse under my direct supervision. FLUOROSCOPY TIME:  Radiation exposure index: 201.1 mGy air kerma COMPLICATIONS: None immediate. TECHNIQUE: Informed written consent was obtained from the patient after a thorough discussion of the procedural risks, benefits and alternatives. All questions were addressed. Maximal Sterile Barrier Technique was utilized including caps, mask, sterile gowns, sterile gloves, sterile drape, hand hygiene and skin antiseptic. A timeout was performed prior to the initiation of the procedure. The patient was placed prone on the fluoroscopic table. The skin overlying the upper thoracic region was then prepped and draped in the usual sterile fashion. Maximal barrier sterile technique was utilized including caps, mask, sterile gowns, sterile gloves, sterile drape, hand hygiene and skin antiseptic. Intravenous Fentanyl and Versed were administered as conscious sedation during continuous cardiorespiratory monitoring by the radiology RN. T11 The left pedicle at T11  was then infiltrated with 1% lidocaine followed by the advancement of a Kyphon trocar needle through the left pedicle into the posterior one-third of the vertebral body. A biopsy specimen was then obtained. Subsequently, the osteo drill was advanced to the anterior third of the vertebral body. The osteo drill was retracted. Through the working cannula, a 15 mm OsteoCool RF ablation probe was inserted and positioned under fluoroscopic guidance. In similar fashion, the right T11 pedicle was infiltrated with 1% lidocaine. Utilizing a extra pedicular approach, a second Kyphon trocar needle was advanced into the posterior third of the vertebral body. A biopsy specimen was then obtained. Subsequently, the osteo drill was coaxially advanced to the anterior right third. The osteo drill was exchanged for a 15 mm OsteoCool RF ablation probe which was positioned under fluoroscopic guidance. With both OsteoCool ablation probes in place, the ablation was performed for 11.5 minutes. Attention  was now paid towards the kyphoplasty portion of the procedure. Beginning at the T11 vertebral body level, a Kyphon inflatable bone tamp 15 x 2.5 was advanced through both working cannulas and positioned with the distal marker approximately 5 mm from the anterior aspect of the cortex. Appropriate positioning was confirmed on the AP projection. At this time, the balloon was expanded using contrast via a Kyphon inflation syringe device via micro tubing. Inflations were continued under direct fluoroscopic guidance. T12 The left pedicle at T12 was then infiltrated with 1% lidocaine followed by the advancement of a Kyphon trocar needle through the left pedicle into the posterior one-third of the vertebral body. A biopsy specimen was then obtained. Subsequently, the osteo drill was advanced to the anterior third of the vertebral body. The osteo drill was retracted. Through the working cannula, a 15 mm OsteoCool RF ablation probe was inserted and  positioned under fluoroscopic guidance. In similar fashion, the right T12 pedicle was infiltrated with 1% lidocaine. Utilizing a extra pedicular approach, a second Kyphon trocar needle was advanced into the posterior third of the vertebral body. A biopsy specimen was then obtained. Subsequently, the osteo drill was coaxially advanced to the anterior right third. The osteo drill was exchanged for a 15 mm OsteoCool RF ablation probe which was positioned under fluoroscopic guidance. With both OsteoCool ablation probes in place, the ablation was performed for 11.5 minutes. Attention was now paid towards the kyphoplasty portion of the procedure. Beginning at the vertebral body level, a Kyphon inflatable bone tamp 15 x 2.5 was advanced through both working cannulas and positioned with the distal marker approximately 5 mm from the anterior aspect of the cortex. Appropriate positioning was confirmed on the AP projection. At this time, the balloon was expanded using contrast via a Kyphon inflation syringe device via micro tubing. Inflations were continued under direct fluoroscopic guidance. L2 The left pedicle at L2 was then infiltrated with 1% lidocaine followed by the advancement of a Kyphon trocar needle through the left pedicle into the posterior one-third of the vertebral body. Subsequently, the osteo drill was advanced to the anterior third of the vertebral body. The osteo drill was retracted. Through the working cannula, a 15 mm OsteoCool RF ablation probe was inserted and positioned under fluoroscopic guidance. In similar fashion, the right L2 pedicle was infiltrated with 1% lidocaine. Utilizing a extra pedicular approach, a second Kyphon trocar needle was advanced into the posterior third of the vertebral body. Subsequently, the osteo drill was coaxially advanced to the anterior right third. The osteo drill was exchanged for a 15 mm OsteoCool RF ablation probe which was positioned under fluoroscopic guidance. With  both OsteoCool ablation probes in place, the ablation was performed for 11.5 minutes. Attention was now paid towards the kyphoplasty portion of the procedure. Beginning at the L2 vertebral body level, a Kyphon inflatable bone tamp 15 x 2.5 was advanced through both working cannulas and positioned with the distal marker approximately 5 mm from the anterior aspect of the cortex. Appropriate positioning was confirmed on the AP projection. At this time, the balloon was expanded using contrast via a Kyphon inflation syringe device via micro tubing. Inflations were continued under direct fluoroscopic guidance. At this time, methylmethacrylate mixture was reconstituted in the Kyphon bone mixing device system. This was then loaded into the delivery mechanism, attached to Kyphon bone fillers and the cement delivery system. The balloons were deflated and removed followed by the instillation of methylmethacrylate mixture with excellent filling in the AP  and lateral projections. The working cannulae and the bone filler were then retrieved and removed. Multiple spot radiographic images were obtained in various obliquities. Hemostasis was achieved with manual compression. The patient tolerated the procedure well without immediate postprocedural complication. FINDINGS: Completion images demonstrate a technically excellent result with adequate cement filling of the vertebral bodies on both the AP and lateral projections. No extravasation was noted in the disk spaces or posteriorly into the spinal canal. No epidural venous contamination was seen. IMPRESSION: Technically successful T11 vertebral body biopsy, ablation and cement augmentation using balloon kyphoplasty. Technically successful T12 vertebral body biopsy, ablation and cement augmentation using balloon kyphoplasty. Technically successful L2 vertebral body ablation and cement augmentation using balloon kyphoplasty. PLAN: The patient will be seen for clinical follow-up at the  interventional radiology clinic in 2-4 weeks. Signed, Criselda Peaches, MD [facility] Radiology Electronically Signed   By: Jacqulynn Cadet M.D.   On: 05/07/2022 13:51   IR Bone Tumor(s)RF Ablation  Result Date: 05/07/2022 CLINICAL DATA:  61 year old male with a history of multiple myeloma and multiple painful pathologic compression fractures with delayed healing. At the time of initial consultation, symptomatic compression fractures were identified at T12 and L2. However, on the day of the procedure, fluoroscopic imaging for operative site localization revealed a new significant pathologic compression fracture at T11 as well. The patient and his wife confirmed increasing symptoms in this region. Therefore, we elected to proceed with combined radiofrequency ablation and cement augmentation with balloon kyphoplasty at all 3 affected levels. Z61.09UE Pathological fracture in neoplastic disease, other specified site, initial encounter for fracture C90.00* Multiple myeloma not having achieved remission EXAM: FLUOROSCOPIC GUIDED T11 VERTEBRAL BODY BIOPSY RF ABLATION AND KYPHOPLASTY/CEMENT AUGMENTATION. FLUOROSCOPIC GUIDED T12 VERTEBRAL BODY BIOPSY RF ABLATION AND KYPHOPLASTY/CEMENT AUGMENTATION. FLUOROSCOPIC GUIDED L2 VERTEBRAL BODY BIOPSY RF ABLATION AND KYPHOPLASTY/CEMENT AUGMENTATION. COMPARISON:  None Available. MEDICATIONS: Ancef 2 g IV; The antibiotic was administered in an appropriate time interval prior to needle puncture of the skin. ANESTHESIA/SEDATION: Versed 8 mg IV; Fentanyl 300 mcg IV Moderate Sedation Time: 90 minutes; The patient's vital signs and level of consciousness were continuously monitored during the procedure by the interventional radiology nurse under my direct supervision. FLUOROSCOPY TIME:  Radiation exposure index: 201.1 mGy air kerma COMPLICATIONS: None immediate. TECHNIQUE: Informed written consent was obtained from the patient  after a thorough discussion of the procedural risks, benefits and alternatives. All questions were addressed. Maximal Sterile Barrier Technique was utilized including caps, mask, sterile gowns, sterile gloves, sterile drape, hand hygiene and skin antiseptic. A timeout was performed prior to the initiation of the procedure. The patient was placed prone on the fluoroscopic table. The skin overlying the upper thoracic region was then prepped and draped in the usual sterile fashion. Maximal barrier sterile technique was utilized including caps, mask, sterile gowns, sterile gloves, sterile drape, hand hygiene and skin antiseptic. Intravenous Fentanyl and Versed were administered as conscious sedation during continuous cardiorespiratory monitoring by the radiology RN. T11 The left pedicle at T11 was then infiltrated with 1% lidocaine followed by the advancement of a Kyphon trocar needle through the left pedicle into the posterior one-third of the vertebral body. A biopsy specimen was then obtained. Subsequently, the osteo drill was advanced to the anterior third of the vertebral body. The osteo drill was retracted. Through the working cannula, a 15 mm OsteoCool RF ablation probe was inserted and positioned under fluoroscopic guidance. In similar fashion, the right T11 pedicle was infiltrated with  1% lidocaine. Utilizing a extra pedicular approach, a second Kyphon trocar needle was advanced into the posterior third of the vertebral body. A biopsy specimen was then obtained. Subsequently, the osteo drill was coaxially advanced to the anterior right third. The osteo drill was exchanged for a 15 mm OsteoCool RF ablation probe which was positioned under fluoroscopic guidance. With both OsteoCool ablation probes in place, the ablation was performed for 11.5 minutes. Attention was now paid towards the kyphoplasty portion of the procedure. Beginning at the T11 vertebral body level, a Kyphon inflatable bone tamp 15 x 2.5 was  advanced through both working cannulas and positioned with the distal marker approximately 5 mm from the anterior aspect of the cortex. Appropriate positioning was confirmed on the AP projection. At this time, the balloon was expanded using contrast via a Kyphon inflation syringe device via micro tubing. Inflations were continued under direct fluoroscopic guidance. T12 The left pedicle at T12 was then infiltrated with 1% lidocaine followed by the advancement of a Kyphon trocar needle through the left pedicle into the posterior one-third of the vertebral body. A biopsy specimen was then obtained. Subsequently, the osteo drill was advanced to the anterior third of the vertebral body. The osteo drill was retracted. Through the working cannula, a 15 mm OsteoCool RF ablation probe was inserted and positioned under fluoroscopic guidance. In similar fashion, the right T12 pedicle was infiltrated with 1% lidocaine. Utilizing a extra pedicular approach, a second Kyphon trocar needle was advanced into the posterior third of the vertebral body. A biopsy specimen was then obtained. Subsequently, the osteo drill was coaxially advanced to the anterior right third. The osteo drill was exchanged for a 15 mm OsteoCool RF ablation probe which was positioned under fluoroscopic guidance. With both OsteoCool ablation probes in place, the ablation was performed for 11.5 minutes. Attention was now paid towards the kyphoplasty portion of the procedure. Beginning at the vertebral body level, a Kyphon inflatable bone tamp 15 x 2.5 was advanced through both working cannulas and positioned with the distal marker approximately 5 mm from the anterior aspect of the cortex. Appropriate positioning was confirmed on the AP projection. At this time, the balloon was expanded using contrast via a Kyphon inflation syringe device via micro tubing. Inflations were continued under direct fluoroscopic guidance. L2 The left pedicle at L2 was then infiltrated  with 1% lidocaine followed by the advancement of a Kyphon trocar needle through the left pedicle into the posterior one-third of the vertebral body. Subsequently, the osteo drill was advanced to the anterior third of the vertebral body. The osteo drill was retracted. Through the working cannula, a 15 mm OsteoCool RF ablation probe was inserted and positioned under fluoroscopic guidance. In similar fashion, the right L2 pedicle was infiltrated with 1% lidocaine. Utilizing a extra pedicular approach, a second Kyphon trocar needle was advanced into the posterior third of the vertebral body. Subsequently, the osteo drill was coaxially advanced to the anterior right third. The osteo drill was exchanged for a 15 mm OsteoCool RF ablation probe which was positioned under fluoroscopic guidance. With both OsteoCool ablation probes in place, the ablation was performed for 11.5 minutes. Attention was now paid towards the kyphoplasty portion of the procedure. Beginning at the L2 vertebral body level, a Kyphon inflatable bone tamp 15 x 2.5 was advanced through both working cannulas and positioned with the distal marker approximately 5 mm from the anterior aspect of the cortex. Appropriate positioning was confirmed on the AP projection. At this  time, the balloon was expanded using contrast via a Kyphon inflation syringe device via micro tubing. Inflations were continued under direct fluoroscopic guidance. At this time, methylmethacrylate mixture was reconstituted in the Kyphon bone mixing device system. This was then loaded into the delivery mechanism, attached to Kyphon bone fillers and the cement delivery system. The balloons were deflated and removed followed by the instillation of methylmethacrylate mixture with excellent filling in the AP and lateral projections. The working cannulae and the bone filler were then retrieved and removed. Multiple spot radiographic images were obtained in various obliquities. Hemostasis was  achieved with manual compression. The patient tolerated the procedure well without immediate postprocedural complication. FINDINGS: Completion images demonstrate a technically excellent result with adequate cement filling of the vertebral bodies on both the AP and lateral projections. No extravasation was noted in the disk spaces or posteriorly into the spinal canal. No epidural venous contamination was seen. IMPRESSION: Technically successful T11 vertebral body biopsy, ablation and cement augmentation using balloon kyphoplasty. Technically successful T12 vertebral body biopsy, ablation and cement augmentation using balloon kyphoplasty. Technically successful L2 vertebral body ablation and cement augmentation using balloon kyphoplasty. PLAN: The patient will be seen for clinical follow-up at the interventional radiology clinic in 2-4 weeks. Signed, Criselda Peaches, MD [facility] Radiology Electronically Signed   By: Jacqulynn Cadet M.D.   On: 05/07/2022 13:51   IR Radiologist Eval & Mgmt  Result Date: 04/23/2022 EXAM: NEW PATIENT OFFICE VISIT CHIEF COMPLAINT: SEE EPIC NOTE HISTORY OF PRESENT ILLNESS: SEE EPIC NOTE REVIEW OF SYSTEMS: SEE EPIC NOTE PHYSICAL EXAMINATION: SEE EPIC NOTE ASSESSMENT AND PLAN: SEE EPIC NOTE Electronically Signed   By: Jacqulynn Cadet M.D.   On: 04/23/2022 12:49     ASSESSMENT & PLAN:   61 y.o. very pleasant male with  1. R-ISS stage III multiple myeloma with extensive bone metastases and patholgiic fracture rt shoulder -Bone marrow biopsy and aspiration done 02/06/2022 revealed hypercellular bone marrow with plasma cell neoplasm. 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  Plan -Labs done today were reviewed in detail and adequate for treatment.  -Most recent myeloma panel from 05/14/2022 showed M spike measuring 0.2 g/dL -No significant new toxicities from his  current treatment regimen -Continue daratumumab/carfilzomib/Revlimid/dexamethasone regimen -Continue Revlimid at 15 mg p.o. daily for 3 weeks on 1 week off with Eliquis for VTE prophylaxis -Continue monthly Zometa -Continue a Eliquis for VTE prophylaxis -Continue acyclovir for VZV prophylaxis -A referral has been placed to [facility] myeloma transplant team for consideration of bone marrow transplant.  Follow up: RTC on 06/11/2022 for a follow up visit with Dr. Domonik Levario Limbo  All of the patient's questions were answered with apparent satisfaction. The patient knows to call the clinic with any problems, questions or concerns.  I have spent a total of 30 minutes minutes of face-to-face and non-face-to-face time, preparing to see the patient, performing a medically appropriate examination, counseling and educating the patient,documenting clinical information in the electronic health record,and care coordination.   Dede Query PA-C Dept of Hematology and [place] at [facility] Phone: [phone]

## 2022-05-22 ENCOUNTER — Inpatient Hospital Stay: Payer: BC Managed Care – PPO

## 2022-05-22 VITALS — BP 130/81 | HR 91 | Temp 98.7°F | Resp 18 | Wt 192.0 lb

## 2022-05-22 DIAGNOSIS — Z5112 Encounter for antineoplastic immunotherapy: Secondary | ICD-10-CM | POA: Diagnosis not present

## 2022-05-22 DIAGNOSIS — Z7961 Long term (current) use of immunomodulator: Secondary | ICD-10-CM | POA: Diagnosis not present

## 2022-05-22 DIAGNOSIS — C9000 Multiple myeloma not having achieved remission: Secondary | ICD-10-CM

## 2022-05-22 DIAGNOSIS — M549 Dorsalgia, unspecified: Secondary | ICD-10-CM | POA: Diagnosis not present

## 2022-05-22 DIAGNOSIS — R5383 Other fatigue: Secondary | ICD-10-CM | POA: Diagnosis not present

## 2022-05-22 DIAGNOSIS — Z87891 Personal history of nicotine dependence: Secondary | ICD-10-CM | POA: Diagnosis not present

## 2022-05-22 DIAGNOSIS — Z79891 Long term (current) use of opiate analgesic: Secondary | ICD-10-CM | POA: Diagnosis not present

## 2022-05-22 DIAGNOSIS — Z7189 Other specified counseling: Secondary | ICD-10-CM

## 2022-05-22 MED ORDER — DIPHENHYDRAMINE HCL 25 MG PO CAPS
50.0000 mg | ORAL_CAPSULE | Freq: Once | ORAL | Status: AC
  Administered 2022-05-22: 50 mg via ORAL
  Filled 2022-05-22: qty 2

## 2022-05-22 MED ORDER — SODIUM CHLORIDE 0.9 % IV SOLN: Freq: Once | INTRAVENOUS | Status: AC

## 2022-05-22 MED ORDER — ACETAMINOPHEN 325 MG PO TABS
650.0000 mg | ORAL_TABLET | Freq: Once | ORAL | Status: AC
  Administered 2022-05-22: 650 mg via ORAL
  Filled 2022-05-22: qty 2

## 2022-05-22 MED ORDER — DEXTROSE 5 % IV SOLN
70.0000 mg/m2 | Freq: Once | INTRAVENOUS | Status: AC
  Administered 2022-05-22: 140 mg via INTRAVENOUS
  Filled 2022-05-22: qty 60

## 2022-05-22 MED ORDER — SODIUM CHLORIDE 0.9 % IV SOLN
16.0000 mg/kg | Freq: Once | INTRAVENOUS | Status: AC
  Administered 2022-05-22: 1400 mg via INTRAVENOUS
  Filled 2022-05-22: qty 10

## 2022-05-22 MED ORDER — SODIUM CHLORIDE 0.9 % IV SOLN
20.0000 mg | Freq: Once | INTRAVENOUS | Status: AC
  Administered 2022-05-22: 20 mg via INTRAVENOUS
  Filled 2022-05-22: qty 20

## 2022-05-22 MED ORDER — FAMOTIDINE IN NACL 20-0.9 MG/50ML-% IV SOLN
20.0000 mg | Freq: Once | INTRAVENOUS | Status: AC
  Administered 2022-05-22: 20 mg via INTRAVENOUS
  Filled 2022-05-22: qty 50

## 2022-05-22 NOTE — Patient Instructions (Signed)
[facility] ONCOLOGY  Discharge Instructions: Thank you for choosing [facility] to provide your oncology and hematology care.   If you have a lab appointment with the [facility], please go directly to the [facility] and check in at the registration area.   Wear comfortable clothing and clothing appropriate for easy access to any Portacath or PICC line.   We strive to give you quality time with your provider. You may need to reschedule your appointment if you arrive late (15 or more minutes).  Arriving late affects you and other patients whose appointments are after yours.  Also, if you miss three or more appointments without notifying the office, you may be dismissed from the clinic at the provider's discretion.      For prescription refill requests, have your pharmacy contact our office and allow 72 hours for refills to be completed.    Today you received the following chemotherapy and/or immunotherapy agents: Kyprolis, Daratumumab.       To help prevent nausea and vomiting after your treatment, we encourage you to take your nausea medication as directed.  BELOW ARE SYMPTOMS THAT SHOULD BE REPORTED IMMEDIATELY: *FEVER GREATER THAN 100.4 F (38 C) OR HIGHER *CHILLS OR SWEATING *NAUSEA AND VOMITING THAT IS NOT CONTROLLED WITH YOUR NAUSEA MEDICATION *UNUSUAL SHORTNESS OF BREATH *UNUSUAL BRUISING OR BLEEDING *URINARY PROBLEMS (pain or burning when urinating, or frequent urination) *BOWEL PROBLEMS (unusual diarrhea, constipation, pain near the anus) TENDERNESS IN MOUTH AND THROAT WITH OR WITHOUT PRESENCE OF ULCERS (sore throat, sores in mouth, or a toothache) UNUSUAL RASH, SWELLING OR PAIN  UNUSUAL VAGINAL DISCHARGE OR ITCHING   Items with * indicate a potential emergency and should be followed up as soon as possible or go to the Emergency Department if any problems should occur.  Please show the CHEMOTHERAPY ALERT CARD or IMMUNOTHERAPY ALERT CARD  at check-in to the Emergency Department and triage nurse.  Should you have questions after your visit or need to cancel or reschedule your appointment, please contact [facility]  Dept: [phone]  and follow the prompts.  Office hours are 8:00 a.m. to 4:30 p.m. Monday - Friday. Please note that voicemails left after 4:00 p.m. may not be returned until the following business day.  We are closed weekends and major holidays. You have access to a nurse at all times for urgent questions. Please call the main number to the clinic Dept: [phone] and follow the prompts.   For any non-urgent questions, you may also contact your provider using MyChart. We now offer e-Visits for anyone 76 and older to request care online for non-urgent symptoms. For details visit [email or web address].   Also download the MyChart app! Go to the app store, search "MyChart", open the app, select [facility], and log in with your MyChart username and password.  Masks are optional in the cancer centers. If you would like for your care team to wear a mask while they are taking care of you, please let them know. You may have one support person who is at least 62 years old accompany you for your appointments.

## 2022-05-23 ENCOUNTER — Other Ambulatory Visit: Payer: Self-pay | Admitting: Hematology

## 2022-05-23 DIAGNOSIS — C9000 Multiple myeloma not having achieved remission: Secondary | ICD-10-CM

## 2022-05-24 ENCOUNTER — Other Ambulatory Visit: Payer: Self-pay | Admitting: Hematology

## 2022-05-24 ENCOUNTER — Other Ambulatory Visit (HOSPITAL_COMMUNITY): Payer: Self-pay

## 2022-05-24 ENCOUNTER — Encounter: Payer: Self-pay | Admitting: Hematology

## 2022-05-24 DIAGNOSIS — C9000 Multiple myeloma not having achieved remission: Secondary | ICD-10-CM

## 2022-05-24 MED ORDER — FENTANYL 25 MCG/HR TD PT72
1.0000 | MEDICATED_PATCH | TRANSDERMAL | 0 refills | Status: DC
  Filled 2022-05-24: qty 10, 30d supply, fill #0

## 2022-05-27 ENCOUNTER — Other Ambulatory Visit: Payer: Self-pay | Admitting: Hematology

## 2022-05-27 ENCOUNTER — Other Ambulatory Visit: Payer: Self-pay

## 2022-05-27 DIAGNOSIS — C9000 Multiple myeloma not having achieved remission: Secondary | ICD-10-CM

## 2022-05-28 ENCOUNTER — Other Ambulatory Visit: Payer: Self-pay

## 2022-05-28 DIAGNOSIS — C9000 Multiple myeloma not having achieved remission: Secondary | ICD-10-CM

## 2022-05-28 MED FILL — Dexamethasone Sodium Phosphate Inj 100 MG/10ML: INTRAMUSCULAR | Qty: 2 | Status: AC

## 2022-05-28 NOTE — Progress Notes (Signed)
Referral to [facility] faxed successfully.

## 2022-05-29 ENCOUNTER — Other Ambulatory Visit: Payer: Self-pay | Admitting: Interventional Radiology

## 2022-05-29 ENCOUNTER — Other Ambulatory Visit: Payer: Self-pay | Admitting: Hematology

## 2022-05-29 ENCOUNTER — Inpatient Hospital Stay: Payer: BC Managed Care – PPO

## 2022-05-29 ENCOUNTER — Inpatient Hospital Stay (HOSPITAL_BASED_OUTPATIENT_CLINIC_OR_DEPARTMENT_OTHER): Payer: BC Managed Care – PPO | Admitting: Physician Assistant

## 2022-05-29 VITALS — BP 134/87 | HR 69 | Temp 98.7°F | Resp 16 | Wt 193.2 lb

## 2022-05-29 DIAGNOSIS — C9000 Multiple myeloma not having achieved remission: Secondary | ICD-10-CM

## 2022-05-29 DIAGNOSIS — Z79891 Long term (current) use of opiate analgesic: Secondary | ICD-10-CM | POA: Diagnosis not present

## 2022-05-29 DIAGNOSIS — Z7189 Other specified counseling: Secondary | ICD-10-CM

## 2022-05-29 DIAGNOSIS — Z5112 Encounter for antineoplastic immunotherapy: Secondary | ICD-10-CM | POA: Diagnosis not present

## 2022-05-29 DIAGNOSIS — M7989 Other specified soft tissue disorders: Secondary | ICD-10-CM

## 2022-05-29 DIAGNOSIS — M549 Dorsalgia, unspecified: Secondary | ICD-10-CM | POA: Diagnosis not present

## 2022-05-29 DIAGNOSIS — Z712 Person consulting for explanation of examination or test findings: Secondary | ICD-10-CM

## 2022-05-29 DIAGNOSIS — Z7961 Long term (current) use of immunomodulator: Secondary | ICD-10-CM | POA: Diagnosis not present

## 2022-05-29 DIAGNOSIS — R5383 Other fatigue: Secondary | ICD-10-CM | POA: Diagnosis not present

## 2022-05-29 DIAGNOSIS — Z87891 Personal history of nicotine dependence: Secondary | ICD-10-CM | POA: Diagnosis not present

## 2022-05-29 LAB — CMP (CANCER CENTER ONLY)
ALT: 13 U/L (ref 0–44)
AST: 11 U/L — ABNORMAL LOW (ref 15–41)
Albumin: 3.9 g/dL (ref 3.5–5.0)
Alkaline Phosphatase: 59 U/L (ref 38–126)
Anion gap: 4 — ABNORMAL LOW (ref 5–15)
BUN: 12 mg/dL (ref 8–23)
CO2: 25 mmol/L (ref 22–32)
Calcium: 9 mg/dL (ref 8.9–10.3)
Chloride: 108 mmol/L (ref 98–111)
Creatinine: 0.71 mg/dL (ref 0.61–1.24)
GFR, Estimated: >60 mL/min (ref >60)
Glucose, Bld: 104 mg/dL — ABNORMAL HIGH (ref 70–99)
Potassium: 4.6 mmol/L (ref 3.5–5.1)
Sodium: 137 mmol/L (ref 135–145)
Total Bilirubin: 0.8 mg/dL (ref 0.3–1.2)
Total Protein: 6.1 g/dL — ABNORMAL LOW (ref 6.5–8.1)

## 2022-05-29 LAB — CBC WITH DIFFERENTIAL (CANCER CENTER ONLY)
Abs Immature Granulocytes: 0 10*3/uL (ref 0.00–0.07)
Basophils Absolute: 0 10*3/uL (ref 0.0–0.1)
Basophils Relative: 0 %
Eosinophils Absolute: 0.2 10*3/uL (ref 0.0–0.5)
Eosinophils Relative: 6 %
HCT: 42.3 % (ref 39.0–52.0)
Hemoglobin: 14.1 g/dL (ref 13.0–17.0)
Immature Granulocytes: 0 %
Lymphocytes Relative: 29 %
Lymphs Abs: 1 10*3/uL (ref 0.7–4.0)
MCH: 27.8 pg (ref 26.0–34.0)
MCHC: 33.3 g/dL (ref 30.0–36.0)
MCV: 83.4 fL (ref 80.0–100.0)
Monocytes Absolute: 0.7 10*3/uL (ref 0.1–1.0)
Monocytes Relative: 20 %
Neutro Abs: 1.6 10*3/uL — ABNORMAL LOW (ref 1.7–7.7)
Neutrophils Relative %: 45 %
Platelet Count: 122 10*3/uL — ABNORMAL LOW (ref 150–400)
RBC: 5.07 MIL/uL (ref 4.22–5.81)
RDW: 15.3 % (ref 11.5–15.5)
WBC Count: 3.4 10*3/uL — ABNORMAL LOW (ref 4.0–10.5)
nRBC: 0 % (ref 0.0–0.2)

## 2022-05-29 MED ORDER — ACETAMINOPHEN 325 MG PO TABS
650.0000 mg | ORAL_TABLET | Freq: Once | ORAL | Status: AC
  Administered 2022-05-29: 650 mg via ORAL
  Filled 2022-05-29: qty 2

## 2022-05-29 MED ORDER — SODIUM CHLORIDE 0.9 % IV SOLN
20.0000 mg | Freq: Once | INTRAVENOUS | Status: AC
  Administered 2022-05-29: 20 mg via INTRAVENOUS
  Filled 2022-05-29: qty 20

## 2022-05-29 MED ORDER — SODIUM CHLORIDE 0.9 % IV SOLN: Freq: Once | INTRAVENOUS | Status: AC

## 2022-05-29 MED ORDER — SODIUM CHLORIDE 0.9 % IV SOLN
16.0000 mg/kg | Freq: Once | INTRAVENOUS | Status: AC
  Administered 2022-05-29: 1400 mg via INTRAVENOUS
  Filled 2022-05-29: qty 60

## 2022-05-29 MED ORDER — FAMOTIDINE IN NACL 20-0.9 MG/50ML-% IV SOLN
20.0000 mg | Freq: Once | INTRAVENOUS | Status: AC
  Administered 2022-05-29: 20 mg via INTRAVENOUS
  Filled 2022-05-29: qty 50

## 2022-05-29 MED ORDER — DIPHENHYDRAMINE HCL 25 MG PO CAPS
50.0000 mg | ORAL_CAPSULE | Freq: Once | ORAL | Status: AC
  Administered 2022-05-29: 50 mg via ORAL
  Filled 2022-05-29: qty 2

## 2022-05-29 NOTE — Progress Notes (Signed)
Pt presented to infusion today with new c/o a dime sized lump, located on his anterior L FA near the [facility]. Upon assessment this RN noticed it was hard and tender to touch and to the eye it appeared slightly bruised. Pt stated that he noticed it Monday 05/27/22. Pt voiced concerns of a possible blood clot.  This RN made Dr. Irene Limbo and Anda Kraft PA-C aware. Sonda Rumble assessed Pt chairside.

## 2022-05-29 NOTE — Patient Instructions (Signed)
[name]COL[facility]  Discharge Instructions: Thank you for choosing [facility] to provide your oncology and hematology care.   If you have a lab appointment with the [facility], please go directly to the [facility] and check in at the registration area.   Wear comfortable clothing and clothing appropriate for easy access to any Portacath or PICC line.   We strive to give you quality time with your provider. You may need to reschedule your appointment if you arrive late (15 or more minutes).  Arriving late affects you and other patients whose appointments are after yours.  Also, if you miss three or more appointments without notifying the office, you may be dismissed from the clinic at the provider's discretion.      For prescription refill requests, have your pharmacy contact our office and allow 72 hours for refills to be completed.    Today you received the following chemotherapy and/or immunotherapy agents: Rapid Darzalex      To help prevent nausea and vomiting after your treatment, we encourage you to take your nausea medication as directed.  BELOW ARE SYMPTOMS THAT SHOULD BE REPORTED IMMEDIATELY: *FEVER GREATER THAN 100.4 F (38 C) OR HIGHER *CHILLS OR SWEATING *NAUSEA AND VOMITING THAT IS NOT CONTROLLED WITH YOUR NAUSEA MEDICATION *UNUSUAL SHORTNESS OF BREATH *UNUSUAL BRUISING OR BLEEDING *URINARY PROBLEMS (pain or burning when urinating, or frequent urination) *BOWEL PROBLEMS (unusual diarrhea, constipation, pain near the anus) TENDERNESS IN MOUTH AND THROAT WITH OR WITHOUT PRESENCE OF ULCERS (sore throat, sores in mouth, or a toothache) UNUSUAL RASH, SWELLING OR PAIN  UNUSUAL VAGINAL DISCHARGE OR ITCHING   Items with * indicate a potential emergency and should be followed up as soon as possible or go to the Emergency Department if any problems should occur.  Please show the CHEMOTHERAPY ALERT CARD or IMMUNOTHERAPY ALERT CARD at  check-in to the Emergency Department and triage nurse.  Should you have questions after your visit or need to cancel or reschedule your appointment, please contact [facility]  Dept: [phone]  and follow the prompts.  Office hours are 8:00 a.m. to 4:30 p.m. Monday - Friday. Please note that voicemails left after 4:00 p.m. may not be returned until the following business day.  We are closed weekends and major holidays. You have access to a nurse at all times for urgent questions. Please call the main number to the clinic Dept: [phone] and follow the prompts.   For any non-urgent questions, you may also contact your provider using MyChart. We now offer e-Visits for anyone 13 and older to request care online for non-urgent symptoms. For details visit [email or web address].   Also download the MyChart app! Go to the app store, search "MyChart", open the app, select [facility], and log in with your MyChart username and password.  Masks are optional in the cancer centers. If you would like for your care team to wear a mask while they are taking care of you, please let them know. You may have one support person who is at least 62 years old accompany you for your appointments.

## 2022-05-29 NOTE — Progress Notes (Signed)
Symptom Management Consult note [place]    Patient Care Team: Lennie Odor, [place] as PCP - General (Nurse Practitioner)    Name of the patient: Anthony [name]  [id number]  [birth year]   Date of visit: 05/29/2022   Chief Complaint/Reason for visit: left arm pain   Current Therapy: kyprolis, darzalex  Last treatment:  Day 15   Cycle 2 on 05/22/22   ASSESSMENT & PLAN: Patient is a 61 y.o. male  with oncologic history of multiple myeloma followed by Dr. Irene Limbo.  I have viewed most recent oncology note and lab work.    #) Multiple myeloma -Here for treatment today - Next appointment with oncologist is 10/24   #) Left arm pain -Patient well appearing. Exam shows 1x1 cm area of induration that is tender to the touch. No fluctuance.  -Discussed with patient this is likely thrombophlebitis from recent peripheral IV. Oncologist agrees this is unlikely to be DVT. -Patient is very concerned about the inflammation as he had cellulitis in the past that hospitalized him. Patient requesting imaging, DVT study ordered and scheduled for tomorrow. -Encouraged symptomatic care including warm compresses and tylenol for pain. Patient on Eliquis for DVT prophylaxis so will avoid NSAIDs.     Heme/Onc History: Oncology History  Multiple myeloma ([place])  07/17/2017 Initial Diagnosis   Multiple myeloma ([place])   02/22/2022 - 03/29/2022 Chemotherapy   Patient is on Treatment Plan : MYELOMA NEWLY DIAGNOSED TRANSPLANT CANDIDATE DaraVRd (Daratumumab IV) q21d x 6 Cycles (Induction/Consolidation)     04/08/2022 -  Chemotherapy   Patient is on Treatment Plan : MYELOMA RELAPSED/REFRACTORY Carfilzomib D1,8,15 + Daratumumab + Dexamethasone q28d     Multiple myeloma not having achieved remission ([place])  02/21/2022 Initial Diagnosis   Multiple myeloma not having achieved remission ([place])   02/22/2022 - 03/29/2022 Chemotherapy   Patient is on Treatment Plan : MYELOMA NEWLY DIAGNOSED TRANSPLANT  CANDIDATE DaraVRd (Daratumumab IV) q21d x 6 Cycles (Induction/Consolidation)     04/08/2022 -  Chemotherapy   Patient is on Treatment Plan : MYELOMA RELAPSED/REFRACTORY Carfilzomib D1,8,15 + Daratumumab + Dexamethasone q28d         Interval history-: ELDRA WORD is a 61 y.o. male with oncologic history as above presenting to [facility] today with chief complaint of left arm pain and swelling x 3 days. He noticed an area looked bruised on day of symptom onset. The next day there was swelling and it was tender to the touch.  He denies any pain at rest.  He describes the pain as a soreness and rates it 5 out of 10 in severity with movement.  He denies any traumatic injury to the arm.  He last had an IV 05/22/2022 for treatment in the left forearm.  He denies any numbness or tingling.  He is on Eliquis for DVT prophylaxis and might have missed a dose last week traveling otherwise does his best to be compliant. He denies fever, chills, shortness of breath, chest pain. No OTC medications prior to arrival.      ROS  All other systems are reviewed and are negative for acute change except as noted in the HPI.    No Known Allergies   Past Medical History:  Diagnosis Date   Back pain    Cancer ([place])    Multiple myeloma ([place]) 02/15/2022     Past Surgical History:  Procedure Laterality Date   IR BONE TUMOR(S)RF ABLATION  05/07/2022   IR BONE TUMOR(S)RF ABLATION  05/07/2022   IR BONE TUMOR(S)RF ABLATION  05/07/2022   IR KYPHO EA ADDL LEVEL THORACIC OR LUMBAR  05/07/2022   IR KYPHO LUMBAR INC FX REDUCE BONE BX UNI/BIL CANNULATION INC/IMAGING  05/07/2022   IR KYPHO THORACIC WITH BONE BIOPSY  05/07/2022   IR RADIOLOGIST EVAL & MGMT  04/23/2022   IR RADIOLOGIST EVAL & MGMT  05/17/2022    Social History   Socioeconomic History   Marital status: Married    Spouse name: Not on file   Number of children: Not on file   Years of education: Not on file   Highest education level: Not on file  Occupational  History   Not on file  Tobacco Use   Smoking status: Never   Smokeless tobacco: Former    Types: Chew, Snuff  Vaping Use   Vaping Use: Never used  Substance and Sexual Activity   Alcohol use: Yes    Alcohol/week: 2.0 standard drinks of alcohol    Types: 2 Glasses of wine per week   Drug use: No   Sexual activity: Yes  Other Topics Concern   Not on file  Social History Narrative   Not on file   Social Determinants of Health   Financial Resource Strain: Low Risk  (05/07/2022)   Overall Financial Resource Strain (CARDIA)    Difficulty of Paying Living Expenses: Not very hard  Food Insecurity: No Food Insecurity (05/07/2022)   Hunger Vital Sign    Worried About Running Out of Food in the Last Year: Never true    Ran Out of Food in the Last Year: Never true  Transportation Needs: No Transportation Needs (05/07/2022)   PRAPARE - Hydrologist (Medical): No    Lack of Transportation (Non-Medical): No  Physical Activity: Not on file  Stress: Not on file  Social Connections: Not on file  Intimate Partner Violence: Unknown (05/07/2022)   Humiliation, Afraid, Rape, and Kick questionnaire    Fear of Current or Ex-Partner: No    Emotionally Abused: No    Physically Abused: No    Sexually Abused: Not on file    Family History  Problem Relation Age of Onset   Rheum arthritis Mother    Other Sister        Pre-cancerous uterine mass;    Rheum arthritis Sister    Bone cancer Paternal Grandfather    Brain cancer Maternal Uncle    Brain cancer Maternal Aunt    Osteoporosis Neg Hx      Current Outpatient Medications:    aluminum chloride (DRYSOL) 20 % external solution, Apply 1 application topically at bedtime., Disp: , Rfl:    apixaban (ELIQUIS) 5 MG TABS tablet, Take 1 tablet (5 mg total) by mouth 2 (two) times daily., Disp: 60 tablet, Rfl: 5   calcium-vitamin D (OSCAL WITH D) 250-125 MG-UNIT tablet, Take 1 tablet by mouth daily., Disp: , Rfl:     cyclobenzaprine (FLEXERIL) 10 MG tablet, Take 10 mg by mouth 3 (three) times daily as needed for muscle spasms. (Patient not taking: Reported on 05/21/2022), Disp: , Rfl:    diclofenac sodium (VOLTAREN) 1 % GEL, Apply 2 g topically as needed., Disp: , Rfl:    dronabinol (MARINOL) 5 MG capsule, Take 1 capsule (5 mg total) by mouth 2 (two) times daily before a meal., Disp: 60 capsule, Rfl: 0   ergocalciferol (VITAMIN D2) 1.25 MG (50000 UT) capsule, Take 1 capsule (50,000 Units total) by mouth 2 (two) times  daily., Disp: 12 capsule, Rfl: 2   esomeprazole (NEXIUM) 40 MG capsule, Take 1 capsule (40 mg total) by mouth daily before breakfast., Disp: 30 capsule, Rfl: 1   fentaNYL (DURAGESIC) 25 MCG/HR, Place 1 patch onto the skin every 3 (three) days., Disp: 10 patch, Rfl: 0   furosemide (LASIX) 20 MG tablet, Take 1 tablet (20 mg total) by mouth daily., Disp: 30 tablet, Rfl: 1   lidocaine (LIDODERM) 5 %, Place 1 patch onto the skin daily. Remove & Discard patch within 12 hours or as directed by MD, Disp: 30 patch, Rfl: 0   methocarbamol (ROBAXIN) 500 MG tablet, SMARTSIG:3 Tablet(s) By Mouth Every 6 Hours PRN, Disp: , Rfl:    methylPREDNISolone (MEDROL DOSEPAK) 4 MG TBPK tablet, Take 6 pills by mouth day 1, 5 on day 2, 4 on day 3, 3 on day 4, 2 on day 5, 1 on day 6, Disp: 21 tablet, Rfl: 0   Multiple Vitamin (MULTIVITAMIN) tablet, Take 1 tablet by mouth daily., Disp: , Rfl:    Omega-3 Fatty Acids (FISH OIL) 1360 MG CAPS, Take 1 capsule by mouth daily., Disp: , Rfl:    ondansetron (ZOFRAN) 8 MG tablet, Take 1 tablet (8 mg total) by mouth every 8 (eight) hours as needed for nausea or vomiting., Disp: 30 tablet, Rfl: 0   Oxycodone HCl 10 MG TABS, Take 1/2 TO 1 tablet (5-10 mg total) by mouth every 4 (four) hours as needed for severe pain or moderate pain (cancer related pain)., Disp: 30 tablet, Rfl: 0   REVLIMID 15 MG capsule, TAKE 1 CAPSULE DAILY FOR 14 DAYS ON AND 7 DAYS OFF, Disp: 14 capsule, Rfl: 0    senna-docusate (SENNA S) 8.6-50 MG tablet, Take 2 tablets by mouth at bedtime., Disp: 60 tablet, Rfl: 1   terbinafine (LAMISIL) 1 % cream, Apply 1 application topically 2 (two) times daily., Disp: , Rfl:   PHYSICAL EXAM: ECOG FS:1 - Symptomatic but completely ambulatory   T: 98.7   BP: 149/88      HR: 71    Resp: 18    O2: 98% Physical Exam Vitals and nursing note reviewed.  Constitutional:      Appearance: He is well-developed. He is not ill-appearing or toxic-appearing.  HENT:     Head: Normocephalic.     Nose: Nose normal.  Eyes:     Conjunctiva/sclera: Conjunctivae normal.  Neck:     Vascular: No JVD.  Cardiovascular:     Rate and Rhythm: Normal rate and regular rhythm.     Pulses: Normal pulses.          Radial pulses are 2+ on the left side.     Heart sounds: Normal heart sounds.  Pulmonary:     Effort: Pulmonary effort is normal.     Breath sounds: Normal breath sounds.  Abdominal:     General: There is no distension.  Musculoskeletal:     Cervical back: Normal range of motion.     Comments: Compartments soft in LUE. Full ROM of LUE.  Skin:    General: Skin is warm and dry.     Capillary Refill: Capillary refill takes less than 2 seconds.     Comments: 1 x 1 cm circular area of induration on medial aspect of left forearm. Faint overlying erythema. No red streaking. Tender to deep palpation. No fluctuance. No gross abscess.   Neurological:     Mental Status: He is oriented to person, place, and time.  LABORATORY DATA: I have reviewed the data as listed    Latest Ref Rng & Units 05/29/2022    9:37 AM 05/21/2022    2:28 PM 05/14/2022    9:25 AM  CBC  WBC 4.0 - 10.5 K/uL 3.4  6.1  3.8   Hemoglobin 13.0 - 17.0 g/dL 14.1  13.7  13.7   Hematocrit 39.0 - 52.0 % 42.3  40.2  40.5   Platelets 150 - 400 K/uL 122  186  119         Latest Ref Rng & Units 05/29/2022    9:37 AM 05/21/2022    2:28 PM 05/14/2022    9:25 AM  CMP  Glucose 70 - 99 mg/dL 104  100   127   BUN 8 - 23 mg/dL 12  11  11    Creatinine 0.61 - 1.24 mg/dL 0.71  0.75  0.75   Sodium 135 - 145 mmol/L 137  136  135   Potassium 3.5 - 5.1 mmol/L 4.6  4.1  4.5   Chloride 98 - 111 mmol/L 108  107  105   CO2 22 - 32 mmol/L 25  24  25    Calcium 8.9 - 10.3 mg/dL 9.0  8.9  8.2   Total Protein 6.5 - 8.1 g/dL 6.1  5.7  6.0   Total Bilirubin 0.3 - 1.2 mg/dL 0.8  0.5  0.7   Alkaline Phos 38 - 126 U/L 59  67  59   AST 15 - 41 U/L 11  12  11    ALT 0 - 44 U/L 13  16  12         RADIOGRAPHIC STUDIES (from last 24 hours if applicable) I have personally reviewed the radiological images as listed and agreed with the findings in the report. No results found.      Visit Diagnosis: 1. Left arm swelling   2. Multiple myeloma not having achieved remission ([place])      No orders of the defined types were placed in this encounter.   All questions were answered. The patient knows to call the clinic with any problems, questions or concerns. No barriers to learning was detected.  I have spent a total of 20 minutes minutes of face-to-face and non-face-to-face time, preparing to see the patient, obtaining and/or reviewing separately obtained history, performing a medically appropriate examination, counseling and educating the patient, ordering tests, documenting clinical information in the electronic health record, and care coordination (communications with other health care professionals or caregivers).    Thank you for allowing me to participate in the care of this patient.    Barrie Folk, PA-C Department of Hematology/Oncology [facility] at [facility] Phone: [phone]  [phone]    05/29/2022 3:01 PM

## 2022-05-30 ENCOUNTER — Encounter: Payer: Self-pay | Admitting: Hematology

## 2022-05-30 ENCOUNTER — Ambulatory Visit (HOSPITAL_COMMUNITY)
Admission: RE | Admit: 2022-05-30 | Discharge: 2022-05-30 | Disposition: A | Discharge status: Home / Self Care | Payer: BC Managed Care – PPO | Source: Ambulatory Visit | Attending: Physician Assistant | Admitting: Physician Assistant

## 2022-05-30 DIAGNOSIS — M7989 Other specified soft tissue disorders: Secondary | ICD-10-CM | POA: Diagnosis not present

## 2022-05-30 MED ORDER — OXYCODONE HCL 10 MG PO TABS: ORAL_TABLET | ORAL | 0 refills | Status: DC

## 2022-05-30 NOTE — Progress Notes (Signed)
Left upper extremity venous duplex has been completed. Preliminary results can be found in CV Proc through chart review.  Results were given to [facility] PA.  05/30/22 10:02 AM Carlos Levering RVT

## 2022-06-04 MED FILL — Dexamethasone Sodium Phosphate Inj 100 MG/10ML: INTRAMUSCULAR | Qty: 2 | Status: AC

## 2022-06-05 ENCOUNTER — Inpatient Hospital Stay: Payer: BC Managed Care – PPO

## 2022-06-05 VITALS — BP 126/78 | HR 82 | Temp 98.7°F | Resp 16 | Wt 193.2 lb

## 2022-06-05 DIAGNOSIS — Z5112 Encounter for antineoplastic immunotherapy: Secondary | ICD-10-CM | POA: Diagnosis not present

## 2022-06-05 DIAGNOSIS — Z7189 Other specified counseling: Secondary | ICD-10-CM

## 2022-06-05 DIAGNOSIS — Z87891 Personal history of nicotine dependence: Secondary | ICD-10-CM | POA: Diagnosis not present

## 2022-06-05 DIAGNOSIS — Z7961 Long term (current) use of immunomodulator: Secondary | ICD-10-CM | POA: Diagnosis not present

## 2022-06-05 DIAGNOSIS — C9000 Multiple myeloma not having achieved remission: Secondary | ICD-10-CM

## 2022-06-05 DIAGNOSIS — M549 Dorsalgia, unspecified: Secondary | ICD-10-CM | POA: Diagnosis not present

## 2022-06-05 DIAGNOSIS — R5383 Other fatigue: Secondary | ICD-10-CM | POA: Diagnosis not present

## 2022-06-05 DIAGNOSIS — Z79891 Long term (current) use of opiate analgesic: Secondary | ICD-10-CM | POA: Diagnosis not present

## 2022-06-05 LAB — CMP (CANCER CENTER ONLY)
ALT: 23 U/L (ref 0–44)
AST: 14 U/L — ABNORMAL LOW (ref 15–41)
Albumin: 3.9 g/dL (ref 3.5–5.0)
Alkaline Phosphatase: 66 U/L (ref 38–126)
Anion gap: 6 (ref 5–15)
BUN: 14 mg/dL (ref 8–23)
CO2: 24 mmol/L (ref 22–32)
Calcium: 8.7 mg/dL — ABNORMAL LOW (ref 8.9–10.3)
Chloride: 105 mmol/L (ref 98–111)
Creatinine: 0.82 mg/dL (ref 0.61–1.24)
GFR, Estimated: >60 mL/min (ref >60)
Glucose, Bld: 110 mg/dL — ABNORMAL HIGH (ref 70–99)
Potassium: 4.2 mmol/L (ref 3.5–5.1)
Sodium: 135 mmol/L (ref 135–145)
Total Bilirubin: 0.8 mg/dL (ref 0.3–1.2)
Total Protein: 5.9 g/dL — ABNORMAL LOW (ref 6.5–8.1)

## 2022-06-05 LAB — CBC WITH DIFFERENTIAL (CANCER CENTER ONLY)
Abs Immature Granulocytes: 0.02 10*3/uL (ref 0.00–0.07)
Basophils Absolute: 0 10*3/uL (ref 0.0–0.1)
Basophils Relative: 1 %
Eosinophils Absolute: 0.5 10*3/uL (ref 0.0–0.5)
Eosinophils Relative: 11 %
HCT: 40.6 % (ref 39.0–52.0)
Hemoglobin: 13.5 g/dL (ref 13.0–17.0)
Immature Granulocytes: 1 %
Lymphocytes Relative: 19 %
Lymphs Abs: 0.8 10*3/uL (ref 0.7–4.0)
MCH: 27.7 pg (ref 26.0–34.0)
MCHC: 33.3 g/dL (ref 30.0–36.0)
MCV: 83.2 fL (ref 80.0–100.0)
Monocytes Absolute: 0.4 10*3/uL (ref 0.1–1.0)
Monocytes Relative: 11 %
Neutro Abs: 2.3 10*3/uL (ref 1.7–7.7)
Neutrophils Relative %: 57 %
Platelet Count: 185 10*3/uL (ref 150–400)
RBC: 4.88 MIL/uL (ref 4.22–5.81)
RDW: 15.2 % (ref 11.5–15.5)
WBC Count: 4 10*3/uL (ref 4.0–10.5)
nRBC: 0 % (ref 0.0–0.2)

## 2022-06-05 MED ORDER — ZOLEDRONIC ACID 4 MG/100ML IV SOLN
4.0000 mg | Freq: Once | INTRAVENOUS | Status: AC
  Administered 2022-06-05: 4 mg via INTRAVENOUS
  Filled 2022-06-05: qty 100

## 2022-06-05 MED ORDER — ACETAMINOPHEN 325 MG PO TABS
650.0000 mg | ORAL_TABLET | Freq: Once | ORAL | Status: AC
  Administered 2022-06-05: 650 mg via ORAL
  Filled 2022-06-05: qty 2

## 2022-06-05 MED ORDER — FAMOTIDINE IN NACL 20-0.9 MG/50ML-% IV SOLN
20.0000 mg | Freq: Once | INTRAVENOUS | Status: AC
  Administered 2022-06-05: 20 mg via INTRAVENOUS
  Filled 2022-06-05: qty 50

## 2022-06-05 MED ORDER — DEXTROSE 5 % IV SOLN
70.0000 mg/m2 | Freq: Once | INTRAVENOUS | Status: AC
  Administered 2022-06-05: 140 mg via INTRAVENOUS
  Filled 2022-06-05: qty 60

## 2022-06-05 MED ORDER — SODIUM CHLORIDE 0.9 % IV SOLN
20.0000 mg | Freq: Once | INTRAVENOUS | Status: AC
  Administered 2022-06-05: 20 mg via INTRAVENOUS
  Filled 2022-06-05: qty 20

## 2022-06-05 MED ORDER — SODIUM CHLORIDE 0.9 % IV SOLN
16.0000 mg/kg | Freq: Once | INTRAVENOUS | Status: AC
  Administered 2022-06-05: 1400 mg via INTRAVENOUS
  Filled 2022-06-05: qty 60

## 2022-06-05 MED ORDER — SODIUM CHLORIDE 0.9 % IV SOLN: Freq: Once | INTRAVENOUS | Status: AC

## 2022-06-05 MED ORDER — DIPHENHYDRAMINE HCL 25 MG PO CAPS
50.0000 mg | ORAL_CAPSULE | Freq: Once | ORAL | Status: AC
  Administered 2022-06-05: 50 mg via ORAL
  Filled 2022-06-05: qty 2

## 2022-06-05 NOTE — Patient Instructions (Signed)
[facility]  Discharge Instructions: Thank you for choosing [facility] to provide your oncology and hematology care.   If you have a lab appointment with the [facility], please go directly to the [facility] and check in at the registration area.   Wear comfortable clothing and clothing appropriate for easy access to any Portacath or PICC line.   We strive to give you quality time with your provider. You may need to reschedule your appointment if you arrive late (15 or more minutes).  Arriving late affects you and other patients whose appointments are after yours.  Also, if you miss three or more appointments without notifying the office, you may be dismissed from the clinic at the provider's discretion.      For prescription refill requests, have your pharmacy contact our office and allow 72 hours for refills to be completed.    Today you received the following chemotherapy and/or immunotherapy agents: Kyprolis, Daratumumab.     You also received Zometa.   To help prevent nausea and vomiting after your treatment, we encourage you to take your nausea medication as directed.  BELOW ARE SYMPTOMS THAT SHOULD BE REPORTED IMMEDIATELY: *FEVER GREATER THAN 100.4 F (38 C) OR HIGHER *CHILLS OR SWEATING *NAUSEA AND VOMITING THAT IS NOT CONTROLLED WITH YOUR NAUSEA MEDICATION *UNUSUAL SHORTNESS OF BREATH *UNUSUAL BRUISING OR BLEEDING *URINARY PROBLEMS (pain or burning when urinating, or frequent urination) *BOWEL PROBLEMS (unusual diarrhea, constipation, pain near the anus) TENDERNESS IN MOUTH AND THROAT WITH OR WITHOUT PRESENCE OF ULCERS (sore throat, sores in mouth, or a toothache) UNUSUAL RASH, SWELLING OR PAIN  UNUSUAL VAGINAL DISCHARGE OR ITCHING   Items with * indicate a potential emergency and should be followed up as soon as possible or go to the Emergency Department if any problems should occur.  Please show the CHEMOTHERAPY ALERT CARD or  IMMUNOTHERAPY ALERT CARD at check-in to the Emergency Department and triage nurse.  Should you have questions after your visit or need to cancel or reschedule your appointment, please contact [facility]  Dept: [phone]  and follow the prompts.  Office hours are 8:00 a.m. to 4:30 p.m. Monday - Friday. Please note that voicemails left after 4:00 p.m. may not be returned until the following business day.  We are closed weekends and major holidays. You have access to a nurse at all times for urgent questions. Please call the main number to the clinic Dept: [phone] and follow the prompts.   For any non-urgent questions, you may also contact your provider using MyChart. We now offer e-Visits for anyone 69 and older to request care online for non-urgent symptoms. For details visit [email or web address].   Also download the MyChart app! Go to the app store, search "MyChart", open the app, select [facility], and log in with your MyChart username and password.  Masks are optional in the cancer centers. If you would like for your care team to wear a mask while they are taking care of you, please let them know. You may have one support person who is at least 62 years old accompany you for your appointments.

## 2022-06-06 ENCOUNTER — Ambulatory Visit
Admission: RE | Admit: 2022-06-06 | Discharge: 2022-06-06 | Disposition: A | Discharge status: Home / Self Care | Payer: BC Managed Care – PPO | Source: Ambulatory Visit | Attending: Interventional Radiology | Admitting: Interventional Radiology

## 2022-06-06 DIAGNOSIS — Z712 Person consulting for explanation of examination or test findings: Secondary | ICD-10-CM

## 2022-06-06 DIAGNOSIS — C9000 Multiple myeloma not having achieved remission: Secondary | ICD-10-CM | POA: Diagnosis not present

## 2022-06-06 DIAGNOSIS — M8458XD Pathological fracture in neoplastic disease, other specified site, subsequent encounter for fracture with routine healing: Secondary | ICD-10-CM | POA: Diagnosis not present

## 2022-06-06 HISTORY — PX: IR RADIOLOGIST EVAL & MGMT: IMG5224

## 2022-06-06 NOTE — Progress Notes (Signed)
Chief Complaint: Patient was consulted remotely today (TeleHealth) for multiple myeloma with multiple pathologic compression fractures at the request of Matthew Cina K.    Referring Physician(s): Dr. Irene Limbo  History of Present Illness: Anthony [name] is a 61 y.o. male with multiple myeloma who was suffering from back pain due to multiple pathologic compression fractures.  He recent;ly underwent OsteoCool at T11, T12 and L2 with concurrent biopsies of T11 and T12 on 05/07/22.    T11: A minor monoclonal plasma cell population present  T12: Bone marrow and bone with new bone formation    At her last visit, Anthony [name] continued to have crampy paraspinal muscle pain.  I prescribed him a muscle relaxer which he has been taking with great relief.  He has cut down from 1500 mg to 1000 mg per dose and continues to have good pain relief.  He has refilled the medication once.  We discussed the plan to taper down to 500 mg next week.  If he is unable to taper off the medication, I will consider issuing him a refill.  He is experiencing no significant side effects.   Past Medical History:  Diagnosis Date   Back pain    Cancer ([place])    Multiple myeloma ([place]) 02/15/2022    Past Surgical History:  Procedure Laterality Date   IR BONE TUMOR(S)RF ABLATION  05/07/2022   IR BONE TUMOR(S)RF ABLATION  05/07/2022   IR BONE TUMOR(S)RF ABLATION  05/07/2022   IR KYPHO EA ADDL LEVEL THORACIC OR LUMBAR  05/07/2022   IR KYPHO LUMBAR INC FX REDUCE BONE BX UNI/BIL CANNULATION INC/IMAGING  05/07/2022   IR KYPHO THORACIC WITH BONE BIOPSY  05/07/2022   IR RADIOLOGIST EVAL & MGMT  04/23/2022   IR RADIOLOGIST EVAL & MGMT  05/17/2022    Allergies: Patient has no known allergies.  Medications: Prior to Admission medications   Medication Sig Start Date End Date Taking? Authorizing Provider  aluminum chloride (DRYSOL) 20 % external solution Apply 1 application topically at bedtime.    [provider]   apixaban (ELIQUIS) 5 MG TABS tablet Take 1 tablet (5 mg total) by mouth 2 (two) times daily. 04/10/22   Brunetta Genera, MD  calcium-vitamin D (OSCAL WITH D) 250-125 MG-UNIT tablet Take 1 tablet by mouth daily.    [provider]  cyclobenzaprine (FLEXERIL) 10 MG tablet Take 10 mg by mouth 3 (three) times daily as needed for muscle spasms. Patient not taking: Reported on 05/21/2022    [provider]  diclofenac sodium (VOLTAREN) 1 % GEL Apply 2 g topically as needed.    [provider]  dronabinol (MARINOL) 5 MG capsule Take 1 capsule (5 mg total) by mouth 2 (two) times daily before a meal. 04/30/22   Brunetta Genera, MD  ergocalciferol (VITAMIN D2) 1.25 MG (50000 UT) capsule Take 1 capsule (50,000 Units total) by mouth 2 (two) times daily. 04/03/22   Brunetta Genera, MD  esomeprazole (NEXIUM) 40 MG capsule Take 1 capsule (40 mg total) by mouth daily before breakfast. 04/23/22   Brunetta Genera, MD  fentaNYL (DURAGESIC) 25 MCG/HR Place 1 patch onto the skin every 3 (three) days. 05/24/22   Brunetta Genera, MD  furosemide (LASIX) 20 MG tablet Take 1 tablet (20 mg total) by mouth daily. 03/11/22   Brunetta Genera, MD  lidocaine (LIDODERM) 5 % Place 1 patch onto the skin daily. Remove & Discard patch within 12 hours or as directed by  MD 03/11/22   Brunetta Genera, MD  methocarbamol (ROBAXIN) 500 MG tablet SMARTSIG:3 Tablet(s) By Mouth Every 6 Hours PRN 05/17/22   [provider]  methylPREDNISolone (MEDROL DOSEPAK) 4 MG TBPK tablet Take 6 pills by mouth day 1, 5 on day 2, 4 on day 3, 3 on day 4, 2 on day 5, 1 on day 6 03/08/22   Walisiewicz, Verline Lema E, PA-C  Multiple Vitamin (MULTIVITAMIN) tablet Take 1 tablet by mouth daily.    [provider]  Omega-3 Fatty Acids (FISH OIL) 1360 MG CAPS Take 1 capsule by mouth daily.    [provider]  ondansetron (ZOFRAN) 8 MG tablet Take 1 tablet (8 mg total) by mouth every 8 (eight)  hours as needed for nausea or vomiting. 03/04/22   Brunetta Genera, MD  Oxycodone HCl 10 MG TABS Take 1/2 TO 1 tablet (5-10 mg total) by mouth every 4 (four) hours as needed for severe pain or moderate pain (cancer related pain). 05/30/22   Brunetta Genera, MD  REVLIMID 15 MG capsule TAKE 1 CAPSULE DAILY FOR 14 DAYS ON AND 7 DAYS OFF 05/21/22   Brunetta Genera, MD  senna-docusate (SENNA S) 8.6-50 MG tablet Take 2 tablets by mouth at bedtime. 02/15/22   Brunetta Genera, MD  terbinafine (LAMISIL) 1 % cream Apply 1 application topically 2 (two) times daily.    [provider]  prochlorperazine (COMPAZINE) 10 MG tablet Take 1 tablet (10 mg total) by mouth every 6 (six) hours as needed (Nausea or vomiting). 02/15/22 04/01/22  Brunetta Genera, MD     Family History  Problem Relation Age of Onset   Rheum arthritis Mother    Other Sister        Pre-cancerous uterine mass;    Rheum arthritis Sister    Bone cancer Paternal Grandfather    Brain cancer Maternal Uncle    Brain cancer Maternal Aunt    Osteoporosis Neg Hx     Social History   Socioeconomic History   Marital status: Married    Spouse name: Not on file   Number of children: Not on file   Years of education: Not on file   Highest education level: Not on file  Occupational History   Not on file  Tobacco Use   Smoking status: Never   Smokeless tobacco: Former    Types: Chew, Snuff  Vaping Use   Vaping Use: Never used  Substance and Sexual Activity   Alcohol use: Yes    Alcohol/week: 2.0 standard drinks of alcohol    Types: 2 Glasses of wine per week   Drug use: No   Sexual activity: Yes  Other Topics Concern   Not on file  Social History Narrative   Not on file   Social Determinants of Health   Financial Resource Strain: Low Risk  (05/07/2022)   Overall Financial Resource Strain (CARDIA)    Difficulty of Paying Living Expenses: Not very hard  Food Insecurity: No Food Insecurity (05/07/2022)    Hunger Vital Sign    Worried About Running Out of Food in the Last Year: Never true    Ran Out of Food in the Last Year: Never true  Transportation Needs: No Transportation Needs (05/07/2022)   PRAPARE - Hydrologist (Medical): No    Lack of Transportation (Non-Medical): No  Physical Activity: Not on file  Stress: Not on file  Social Connections: Not on file   Review  of Systems  Review of Systems: A 12 point ROS discussed and pertinent positives are indicated in the HPI above.  All other systems are negative.    Physical Exam No direct physical exam was performed (except for noted visual exam findings with Video Visits).    Vital Signs: There were no vitals taken for this visit.  Imaging: VAS [place] UPPER EXTREMITY VENOUS DUPLEX  Result Date: 05/30/2022 UPPER VENOUS STUDY  Patient Name:  Anthony [name]  Date of Exam:   05/30/2022 Medical Rec #: [id number]        Accession #:    [id number] Date of Birth: [birth year]        Patient Gender: M Patient Age:   34 years Exam Location:  [facility] Procedure:      VAS [place] UPPER EXTREMITY VENOUS DUPLEX Referring Phys: Sherol Dade --------------------------------------------------------------------------------  Indications: Pain Risk Factors: Cancer. Anticoagulation: Eliquis. Comparison Study: No prior studies. Performing Technologist: Oliver Hum RVT  Examination Guidelines: A complete evaluation includes B-mode imaging, spectral Doppler, color Doppler, and power Doppler as needed of all accessible portions of each vessel. Bilateral testing is considered an integral part of a complete examination. Limited examinations for reoccurring indications may be performed as noted.  Right Findings: +----------+------------+---------+-----------+----------+-------+ RIGHT     CompressiblePhasicitySpontaneousPropertiesSummary +----------+------------+---------+-----------+----------+-------+ Subclavian     Full       Yes       Yes                      +----------+------------+---------+-----------+----------+-------+  Left Findings: +----------+------------+---------+-----------+----------+-------+ LEFT      CompressiblePhasicitySpontaneousPropertiesSummary +----------+------------+---------+-----------+----------+-------+ IJV           Full       Yes       Yes                      +----------+------------+---------+-----------+----------+-------+ Subclavian    Full       Yes       Yes                      +----------+------------+---------+-----------+----------+-------+ Axillary      Full       Yes       Yes                      +----------+------------+---------+-----------+----------+-------+ Brachial      Full       Yes       Yes                      +----------+------------+---------+-----------+----------+-------+ Radial        Full                                          +----------+------------+---------+-----------+----------+-------+ Ulnar         Full                                          +----------+------------+---------+-----------+----------+-------+ Cephalic      Full                                          +----------+------------+---------+-----------+----------+-------+  Basilic       None                                   Acute  +----------+------------+---------+-----------+----------+-------+  Summary:  Right: No evidence of thrombosis in the subclavian.  Left: No evidence of deep vein thrombosis in the upper extremity. Findings consistent with acute superficial vein thrombosis involving the left basilic vein.  *See table(s) above for measurements and observations.  Diagnosing physician: Orlie Pollen Electronically signed by Orlie Pollen on 05/30/2022 at 8:05:40 PM.    Final    IR Radiologist Eval & Mgmt  Result Date: 05/17/2022 EXAM: NEW PATIENT OFFICE VISIT CHIEF COMPLAINT: SEE EPIC NOTE HISTORY OF PRESENT ILLNESS:  SEE EPIC NOTE REVIEW OF SYSTEMS: SEE EPIC NOTE PHYSICAL EXAMINATION: SEE EPIC NOTE ASSESSMENT AND PLAN: SEE EPIC NOTE Electronically Signed   By: Jacqulynn Cadet M.D.   On: 05/17/2022 15:41    Labs:  CBC: Recent Labs    05/14/22 0925 05/21/22 1428 05/29/22 0937 06/05/22 0809  WBC 3.8* 6.1 3.4* 4.0  HGB 13.7 13.7 14.1 13.5  HCT 40.5 40.2 42.3 40.6  PLT 119* 186 122* 185    COAGS: No results for input(s): "INR", "APTT" in the last 8760 hours.  BMP: Recent Labs    05/14/22 0925 05/21/22 1428 05/29/22 0937 06/05/22 0809  NA 135 136 137 135  K 4.5 4.1 4.6 4.2  CL 105 107 108 105  CO2 25 24 25 24   GLUCOSE 127* 100* 104* 110*  BUN 11 11 12 14   CALCIUM 8.2* 8.9 9.0 8.7*  CREATININE 0.75 0.75 0.71 0.82  GFRNONAA >60 >60 >60 >60    LIVER FUNCTION TESTS: Recent Labs    05/14/22 0925 05/21/22 1428 05/29/22 0937 06/05/22 0809  BILITOT 0.7 0.5 0.8 0.8  AST 11* 12* 11* 14*  ALT 12 16 13 23   ALKPHOS 59 67 59 66  PROT 6.0* 5.7* 6.1* 5.9*  ALBUMIN 3.7 4.0 3.9 3.9    TUMOR MARKERS: No results for input(s): "AFPTM", "CEA", "CA199", "CHROMGRNA" in the last 8760 hours.  Assessment and Plan:  60 year old male with multiple myeloma and multiple pathologic compression fractures now status post ostial cool at T11, T12 and L2.  Methocarbamol has been very helpful in managing his back spasms.  He is currently doing well.  1.)  Plan to taper from 1000 mg twice daily to 500 mg twice daily over the next week. 2.)  No further scheduled follow-up.  He will reach out to [place] if he continues to have issues and requires a refill of the methocarbamol.     Electronically Signed: Criselda Peaches 06/06/2022, 2:23 PM   I spent a total of  15 Minutes in remote  clinical consultation, greater than 50% of which was counseling/coordinating care for back pain, multiple myeloma.    Visit type: Audio only (telephone). Audio (no video) only due to patient preference. Alternative for  in-person consultation at [facility], [address], [place], [place]. This visit type was conducted due to national recommendations for restrictions regarding the COVID-19 Pandemic (e.g. social distancing).  This format is felt to be most appropriate for this patient at this time.  All issues noted in this document were discussed and addressed.

## 2022-06-07 ENCOUNTER — Other Ambulatory Visit: Payer: Self-pay | Admitting: Hematology

## 2022-06-07 DIAGNOSIS — C9000 Multiple myeloma not having achieved remission: Secondary | ICD-10-CM

## 2022-06-10 LAB — MULTIPLE MYELOMA PANEL, SERUM
Albumin SerPl Elph-Mcnc: 3.7 g/dL (ref 2.9–4.4)
Albumin/Glob SerPl: 2 — ABNORMAL HIGH (ref 0.7–1.7)
Alpha 1: 0.3 g/dL (ref 0.0–0.4)
Alpha2 Glob SerPl Elph-Mcnc: 0.4 g/dL (ref 0.4–1.0)
B-Globulin SerPl Elph-Mcnc: 0.8 g/dL (ref 0.7–1.3)
Gamma Glob SerPl Elph-Mcnc: 0.4 g/dL (ref 0.4–1.8)
Globulin, Total: 1.9 g/dL — ABNORMAL LOW (ref 2.2–3.9)
IgA: 40 mg/dL — ABNORMAL LOW (ref 61–437)
IgG (Immunoglobin G), Serum: 495 mg/dL — ABNORMAL LOW (ref 603–1613)
IgM (Immunoglobulin M), Srm: 15 mg/dL — ABNORMAL LOW (ref 20–172)
M Protein SerPl Elph-Mcnc: 0.2 g/dL — ABNORMAL HIGH
Total Protein ELP: 5.6 g/dL — ABNORMAL LOW (ref 6.0–8.5)

## 2022-06-11 ENCOUNTER — Other Ambulatory Visit (HOSPITAL_COMMUNITY): Payer: Self-pay

## 2022-06-11 ENCOUNTER — Inpatient Hospital Stay (HOSPITAL_BASED_OUTPATIENT_CLINIC_OR_DEPARTMENT_OTHER): Payer: BC Managed Care – PPO | Admitting: Hematology

## 2022-06-11 ENCOUNTER — Encounter: Payer: Self-pay | Admitting: Hematology

## 2022-06-11 ENCOUNTER — Other Ambulatory Visit: Payer: Self-pay

## 2022-06-11 ENCOUNTER — Inpatient Hospital Stay: Payer: BC Managed Care – PPO

## 2022-06-11 ENCOUNTER — Other Ambulatory Visit: Payer: Self-pay | Admitting: Hematology

## 2022-06-11 VITALS — BP 125/76 | HR 68 | Resp 18

## 2022-06-11 VITALS — BP 128/82 | HR 82 | Temp 97.3°F | Resp 18 | Ht 67.0 in | Wt 191.5 lb

## 2022-06-11 DIAGNOSIS — R5383 Other fatigue: Secondary | ICD-10-CM | POA: Diagnosis not present

## 2022-06-11 DIAGNOSIS — Z7961 Long term (current) use of immunomodulator: Secondary | ICD-10-CM | POA: Diagnosis not present

## 2022-06-11 DIAGNOSIS — C9000 Multiple myeloma not having achieved remission: Secondary | ICD-10-CM | POA: Diagnosis not present

## 2022-06-11 DIAGNOSIS — Z7189 Other specified counseling: Secondary | ICD-10-CM

## 2022-06-11 DIAGNOSIS — Z79891 Long term (current) use of opiate analgesic: Secondary | ICD-10-CM | POA: Diagnosis not present

## 2022-06-11 DIAGNOSIS — Z87891 Personal history of nicotine dependence: Secondary | ICD-10-CM | POA: Diagnosis not present

## 2022-06-11 DIAGNOSIS — M549 Dorsalgia, unspecified: Secondary | ICD-10-CM | POA: Diagnosis not present

## 2022-06-11 DIAGNOSIS — Z5112 Encounter for antineoplastic immunotherapy: Secondary | ICD-10-CM | POA: Diagnosis not present

## 2022-06-11 LAB — CBC WITH DIFFERENTIAL (CANCER CENTER ONLY)
Abs Immature Granulocytes: 0.02 10*3/uL (ref 0.00–0.07)
Basophils Absolute: 0 10*3/uL (ref 0.0–0.1)
Basophils Relative: 0 %
Eosinophils Absolute: 0.4 10*3/uL (ref 0.0–0.5)
Eosinophils Relative: 6 %
HCT: 46.2 % (ref 39.0–52.0)
Hemoglobin: 15.3 g/dL (ref 13.0–17.0)
Immature Granulocytes: 0 %
Lymphocytes Relative: 15 %
Lymphs Abs: 0.9 10*3/uL (ref 0.7–4.0)
MCH: 27.6 pg (ref 26.0–34.0)
MCHC: 33.1 g/dL (ref 30.0–36.0)
MCV: 83.2 fL (ref 80.0–100.0)
Monocytes Absolute: 0.9 10*3/uL (ref 0.1–1.0)
Monocytes Relative: 15 %
Neutro Abs: 3.9 10*3/uL (ref 1.7–7.7)
Neutrophils Relative %: 64 %
Platelet Count: 144 10*3/uL — ABNORMAL LOW (ref 150–400)
RBC: 5.55 MIL/uL (ref 4.22–5.81)
RDW: 15.3 % (ref 11.5–15.5)
WBC Count: 6.2 10*3/uL (ref 4.0–10.5)
nRBC: 0 % (ref 0.0–0.2)

## 2022-06-11 LAB — CMP (CANCER CENTER ONLY)
ALT: 13 U/L (ref 0–44)
AST: 11 U/L — ABNORMAL LOW (ref 15–41)
Albumin: 4.2 g/dL (ref 3.5–5.0)
Alkaline Phosphatase: 58 U/L (ref 38–126)
Anion gap: 7 (ref 5–15)
BUN: 12 mg/dL (ref 8–23)
CO2: 23 mmol/L (ref 22–32)
Calcium: 8.3 mg/dL — ABNORMAL LOW (ref 8.9–10.3)
Chloride: 105 mmol/L (ref 98–111)
Creatinine: 0.82 mg/dL (ref 0.61–1.24)
GFR, Estimated: >60 mL/min (ref >60)
Glucose, Bld: 113 mg/dL — ABNORMAL HIGH (ref 70–99)
Potassium: 4.3 mmol/L (ref 3.5–5.1)
Sodium: 135 mmol/L (ref 135–145)
Total Bilirubin: 0.7 mg/dL (ref 0.3–1.2)
Total Protein: 6.4 g/dL — ABNORMAL LOW (ref 6.5–8.1)

## 2022-06-11 MED ORDER — DEXTROSE 5 % IV SOLN
70.0000 mg/m2 | Freq: Once | INTRAVENOUS | Status: AC
  Administered 2022-06-11: 140 mg via INTRAVENOUS
  Filled 2022-06-11: qty 60

## 2022-06-11 MED ORDER — SODIUM CHLORIDE 0.9 % IV SOLN: Freq: Once | INTRAVENOUS | Status: AC

## 2022-06-11 MED ORDER — REVLIMID 15 MG PO CAPS: ORAL_CAPSULE | ORAL | 0 refills | Status: DC

## 2022-06-11 MED ORDER — SODIUM CHLORIDE 0.9 % IV SOLN
20.0000 mg | Freq: Once | INTRAVENOUS | Status: AC
  Administered 2022-06-11: 20 mg via INTRAVENOUS
  Filled 2022-06-11: qty 20

## 2022-06-11 MED ORDER — OXYCODONE HCL 10 MG PO TABS: ORAL_TABLET | ORAL | 0 refills | Status: DC

## 2022-06-11 MED ORDER — DRONABINOL 5 MG PO CAPS
5.0000 mg | ORAL_CAPSULE | Freq: Two times a day (BID) | ORAL | 0 refills | Status: DC
  Filled 2022-06-11: qty 60, 30d supply, fill #0

## 2022-06-11 NOTE — Patient Instructions (Signed)
[facility] MEDICAL ONCOLOGY  Discharge Instructions: Thank you for choosing [facility] to provide your oncology and hematology care.   If you have a lab appointment with the Cancer Center, please go directly to the Cancer Center and check in at the registration area.   Wear comfortable clothing and clothing appropriate for easy access to any Portacath or PICC line.   We strive to give you quality time with your provider. You may need to reschedule your appointment if you arrive late (15 or more minutes).  Arriving late affects you and other patients whose appointments are after yours.  Also, if you miss three or more appointments without notifying the office, you may be dismissed from the clinic at the provider's discretion.      For prescription refill requests, have your pharmacy contact our office and allow 72 hours for refills to be completed.    Today you received the following chemotherapy and/or immunotherapy agents: Kyprolis.       To help prevent nausea and vomiting after your treatment, we encourage you to take your nausea medication as directed.  BELOW ARE SYMPTOMS THAT SHOULD BE REPORTED IMMEDIATELY: *FEVER GREATER THAN 100.4 F (38 C) OR HIGHER *CHILLS OR SWEATING *NAUSEA AND VOMITING THAT IS NOT CONTROLLED WITH YOUR NAUSEA MEDICATION *UNUSUAL SHORTNESS OF BREATH *UNUSUAL BRUISING OR BLEEDING *URINARY PROBLEMS (pain or burning when urinating, or frequent urination) *BOWEL PROBLEMS (unusual diarrhea, constipation, pain near the anus) TENDERNESS IN MOUTH AND THROAT WITH OR WITHOUT PRESENCE OF ULCERS (sore throat, sores in mouth, or a toothache) UNUSUAL RASH, SWELLING OR PAIN  UNUSUAL VAGINAL DISCHARGE OR ITCHING   Items with * indicate a potential emergency and should be followed up as soon as possible or go to the Emergency Department if any problems should occur.  Please show the CHEMOTHERAPY ALERT CARD or IMMUNOTHERAPY ALERT CARD at check-in to  the Emergency Department and triage nurse.  Should you have questions after your visit or need to cancel or reschedule your appointment, please contact [facility]ICAL [facility]COL[facility]  Dept: [phone]  and follow the prompts.  Office hours are 8:00 a.m. to 4:30 p.m. Monday - Friday. Please note that voicemails left after 4:00 p.m. may not be returned until the following business day.  We are closed weekends and major holidays. You have access to a nurse at all times for urgent questions. Please call the main number to the clinic Dept: [phone] and follow the prompts.   For any non-urgent questions, you may also contact your provider using MyChart. We now offer e-Visits for anyone 18 and older to request care online for non-urgent symptoms. For details visit [email or web address].   Also download the MyChart app! Go to the app store, search "MyChart", open the app, select , and log in with your MyChart username and password.  Masks are optional in the cancer centers. If you would like for your care team to wear a mask while they are taking care of you, please let them know. You may have one support person who is at least 62 years old accompany you for your appointments. 

## 2022-06-11 NOTE — Progress Notes (Signed)
HEMATOLOGY/ONCOLOGY CLINIC NOTE  Date of Service: 06/11/2022   Patient Care Team: Lennie Odor, [place] as PCP - General (Nurse Practitioner)  CHIEF COMPLAINTS/PURPOSE OF CONSULTATION:  Continue evaluation and management of multiple myeloma  HISTORY OF PRESENTING ILLNESS:   Anthony [name] is a wonderful 61 y.o. male who has been referred to [place] by Dr Lennie Odor, PA for evaluation and management of newly diagnosed multiple myeloma. He reports He is doing well.  He reports persistent lower back pain that he has had since he was in his 20's. He notes no previous significant injuries. He further notes that he gets chiropractic adjustments and takes Tylenol to manage his symptoms. He notes less back pain when standing up on his right leg and maintaining weight on his right leg.  He reports previous history of smoking and chewing tobacco over 40 years ago.  He had a recent fall back in February of this year with only a pulled muscle. And he notes another fall when chasing his cat where he fell on his right shoulder. He reports pain in right shoulder.   He had a recent COVID-19 infection back in March.  He reports intermittent cough with SOB. He notes he takes 2 Rolaids at night. We discussed potentially trying antacids which he is agreeable to as he will begin steroids soon and was advised of the possible symptoms and side effects from taking steroids.  We discussed CRAB criteria and that he meets at least two of the criterion being  anemia and bone disease.  We discussed getting additional scans for further evaluation which he is agreeable to.  We further discussed starting Daratumumab/Velcade/Dexamethasone/Zometa and Revlimid for treatment which he is agreeable to. We also discussed starting steroids before treatment and taking an acid suppressant which he was also agreeable to.  We discussed getting Senna and taking it as needed for constipation.  Labs done today were reviewed in  detail.  We discussed his recent bone marrow biopsy and aspiration done 02/06/2022.  We discussed PET/CT scan done 01/28/2022.  We discussed CT right shoulder w/o contrast done 11/13/2021  INTERVAL HISTORY:  Anthony [name] is a 61 y.o. male is here for continued evaluation and management of his multiple myeloma and cycle 3 of daratumumab carfilzomib Revlimid dexamethasone treatment. He reports He is doing well with no new symptoms or concerns.  We discussed various treatment options.  He notes itchy rash in right cubital fossa and inner groin areas.  He is eating well.  He is tolerating current dose of carfilzomib at 70 mg per metered square without any new toxicities.  No fever, chills or night sweats. No other new or acute focal symptoms.  Labs done today were reviewed in detail.  MEDICAL HISTORY:  Past Medical History:  Diagnosis Date   Back pain    Cancer ([place])    Multiple myeloma ([place]) 02/15/2022    SURGICAL HISTORY: Past Surgical History:  Procedure Laterality Date   IR BONE TUMOR(S)RF ABLATION  05/07/2022   IR BONE TUMOR(S)RF ABLATION  05/07/2022   IR BONE TUMOR(S)RF ABLATION  05/07/2022   IR KYPHO EA ADDL LEVEL THORACIC OR LUMBAR  05/07/2022   IR KYPHO LUMBAR INC FX REDUCE BONE BX UNI/BIL CANNULATION INC/IMAGING  05/07/2022   IR KYPHO THORACIC WITH BONE BIOPSY  05/07/2022   IR RADIOLOGIST EVAL & MGMT  04/23/2022   IR RADIOLOGIST EVAL & MGMT  05/17/2022   IR RADIOLOGIST EVAL & MGMT  06/06/2022    SOCIAL  HISTORY: Social History   Socioeconomic History   Marital status: Married    Spouse name: Not on file   Number of children: Not on file   Years of education: Not on file   Highest education level: Not on file  Occupational History   Not on file  Tobacco Use   Smoking status: Never   Smokeless tobacco: Former    Types: Chew, Snuff  Vaping Use   Vaping Use: Never used  Substance and Sexual Activity   Alcohol use: Yes    Alcohol/week: 2.0 standard drinks  of alcohol    Types: 2 Glasses of wine per week   Drug use: No   Sexual activity: Yes  Other Topics Concern   Not on file  Social History Narrative   Not on file   Social Determinants of Health   Financial Resource Strain: Low Risk  (05/07/2022)   Overall Financial Resource Strain (CARDIA)    Difficulty of Paying Living Expenses: Not very hard  Food Insecurity: No Food Insecurity (05/07/2022)   Hunger Vital Sign    Worried About Running Out of Food in the Last Year: Never true    Ran Out of Food in the Last Year: Never true  Transportation Needs: No Transportation Needs (05/07/2022)   PRAPARE - Hydrologist (Medical): No    Lack of Transportation (Non-Medical): No  Physical Activity: Not on file  Stress: Not on file  Social Connections: Not on file  Intimate Partner Violence: Unknown (05/07/2022)   Humiliation, Afraid, Rape, and Kick questionnaire    Fear of Current or Ex-Partner: No    Emotionally Abused: No    Physically Abused: No    Sexually Abused: Not on file    FAMILY HISTORY: Family History  Problem Relation Age of Onset   Rheum arthritis Mother    Other Sister        Pre-cancerous uterine mass;    Rheum arthritis Sister    Bone cancer Paternal Grandfather    Brain cancer Maternal Uncle    Brain cancer Maternal Aunt    Osteoporosis Neg Hx     ALLERGIES:  has No Known Allergies.  MEDICATIONS:  Current Outpatient Medications  Medication Sig Dispense Refill   aluminum chloride (DRYSOL) 20 % external solution Apply 1 application topically at bedtime.     apixaban (ELIQUIS) 5 MG TABS tablet Take 1 tablet (5 mg total) by mouth 2 (two) times daily. 60 tablet 5   calcium-vitamin D (OSCAL WITH D) 250-125 MG-UNIT tablet Take 1 tablet by mouth daily.     cyclobenzaprine (FLEXERIL) 10 MG tablet Take 10 mg by mouth 3 (three) times daily as needed for muscle spasms. (Patient not taking: Reported on 05/21/2022)     diclofenac sodium (VOLTAREN) 1  % GEL Apply 2 g topically as needed.     dronabinol (MARINOL) 5 MG capsule Take 1 capsule (5 mg total) by mouth 2 (two) times daily before a meal. 60 capsule 0   ergocalciferol (VITAMIN D2) 1.25 MG (50000 UT) capsule Take 1 capsule (50,000 Units total) by mouth 2 (two) times daily. 12 capsule 2   esomeprazole (NEXIUM) 40 MG capsule Take 1 capsule (40 mg total) by mouth daily before breakfast. 30 capsule 1   fentaNYL (DURAGESIC) 25 MCG/HR Place 1 patch onto the skin every 3 (three) days. 10 patch 0   furosemide (LASIX) 20 MG tablet Take 1 tablet (20 mg total) by mouth daily. 30 tablet 1  lidocaine (LIDODERM) 5 % Place 1 patch onto the skin daily. Remove & Discard patch within 12 hours or as directed by MD 30 patch 0   methocarbamol (ROBAXIN) 500 MG tablet SMARTSIG:3 Tablet(s) By Mouth Every 6 Hours PRN     methylPREDNISolone (MEDROL DOSEPAK) 4 MG TBPK tablet Take 6 pills by mouth day 1, 5 on day 2, 4 on day 3, 3 on day 4, 2 on day 5, 1 on day 6 21 tablet 0   Multiple Vitamin (MULTIVITAMIN) tablet Take 1 tablet by mouth daily.     Omega-3 Fatty Acids (FISH OIL) 1360 MG CAPS Take 1 capsule by mouth daily.     ondansetron (ZOFRAN) 8 MG tablet Take 1 tablet (8 mg total) by mouth every 8 (eight) hours as needed for nausea or vomiting. 30 tablet 0   Oxycodone HCl 10 MG TABS Take 1/2 TO 1 tablet (5-10 mg total) by mouth every 4 (four) hours as needed for severe pain or moderate pain (cancer related pain). 30 tablet 0   REVLIMID 15 MG capsule TAKE 1 CAPSULE DAILY FOR 14 DAYS ON AND 7 DAYS OFF 14 capsule 0   senna-docusate (SENNA S) 8.6-50 MG tablet Take 2 tablets by mouth at bedtime. 60 tablet 1   terbinafine (LAMISIL) 1 % cream Apply 1 application topically 2 (two) times daily.     No current facility-administered medications for this visit.    10 Point review of Systems was done is negative except as noted above.  PHYSICAL EXAMINATION: Vital signs reviewed  NAD GENERAL:alert, in no acute distress  and comfortable SKIN: rash in right cubital fossa and inner groin areas. EYES: conjunctiva are pink and non-injected, sclera anicteric NECK: supple, no JVD LYMPH:  no palpable lymphadenopathy in the cervical, axillary or inguinal regions LUNGS: clear to auscultation b/l with normal respiratory effort HEART: regular rate & rhythm ABDOMEN:  normoactive bowel sounds , non tender, not distended. Extremity: no pedal edema PSYCH: alert & oriented x 3 with fluent speech NEURO: no focal motor/sensory deficits  LABORATORY DATA:  I have reviewed the data as listed .    Latest Ref Rng & Units 06/11/2022    1:20 PM 06/05/2022    8:09 AM 05/29/2022    9:37 AM  CBC  WBC 4.0 - 10.5 K/uL 6.2  4.0  3.4   Hemoglobin 13.0 - 17.0 g/dL 15.3  13.5  14.1   Hematocrit 39.0 - 52.0 % 46.2  40.6  42.3   Platelets 150 - 400 K/uL 144  185  122       Latest Ref Rng & Units 06/05/2022    8:09 AM 05/29/2022    9:37 AM 05/21/2022    2:28 PM  CMP  Glucose 70 - 99 mg/dL 110  104  100   BUN 8 - 23 mg/dL _0 Creatinine 0.61 - 1.24 mg/dL 0.82  0.71  0.75   Sodium 135 - 145 mmol/L 135  137  136   Potassium 3.5 - 5.1 mmol/L 4.2  4.6  4.1   Chloride 98 - 111 mmol/L 105  108  107   CO2 22 - 32 mmol/L _1 Calcium 8.9 - 10.3 mg/dL 8.7  9.0  8.9   Total Protein 6.5 - 8.1 g/dL 5.9  6.1  5.7   Total Bilirubin 0.3 - 1.2 mg/dL 0.8  0.8  0.5   Alkaline Phos 38 - 126 U/L 66  59  67   AST 15 - 41 U/L _0 ALT 0 - 44 U/L _1 02/06/2022 Molecular pathology   RADIOGRAPHIC STUDIES: I have personally reviewed the radiological images as listed and agreed with the findings in the report. IR Radiologist Eval & Mgmt  Result Date: 06/06/2022 EXAM: PATIENT OFFICE VISIT CHIEF COMPLAINT: SEE EPIC NOTE HISTORY OF PRESENT ILLNESS: SEE EPIC NOTE REVIEW OF SYSTEMS: SEE EPIC NOTE PHYSICAL EXAMINATION: SEE EPIC NOTE ASSESSMENT AND PLAN: SEE EPIC NOTE Electronically Signed   By: Jacqulynn Cadet  M.D.   On: 06/06/2022 15:05   VAS [place] UPPER EXTREMITY VENOUS DUPLEX  Result Date: 05/30/2022 UPPER VENOUS STUDY  Patient Name:  Anthony [name]  Date of Exam:   05/30/2022 Medical Rec #: [id number]        Accession #:    [id number] Date of Birth: 05-03-1961        Patient Gender: M Patient Age:   49 years Exam Location:  [facility] Procedure:      VAS [place] UPPER EXTREMITY VENOUS DUPLEX Referring Phys: Sherol Dade --------------------------------------------------------------------------------  Indications: Pain Risk Factors: Cancer. Anticoagulation: Eliquis. Comparison Study: No prior studies. Performing Technologist: Oliver Hum RVT  Examination Guidelines: A complete evaluation includes B-mode imaging, spectral Doppler, color Doppler, and power Doppler as needed of all accessible portions of each vessel. Bilateral testing is considered an integral part of a complete examination. Limited examinations for reoccurring indications may be performed as noted.  Right Findings: +----------+------------+---------+-----------+----------+-------+ RIGHT     CompressiblePhasicitySpontaneousPropertiesSummary +----------+------------+---------+-----------+----------+-------+ Subclavian    Full       Yes       Yes                      +----------+------------+---------+-----------+----------+-------+  Left Findings: +----------+------------+---------+-----------+----------+-------+ LEFT      CompressiblePhasicitySpontaneousPropertiesSummary +----------+------------+---------+-----------+----------+-------+ IJV           Full       Yes       Yes                      +----------+------------+---------+-----------+----------+-------+ Subclavian    Full       Yes       Yes                      +----------+------------+---------+-----------+----------+-------+ Axillary      Full       Yes       Yes                       +----------+------------+---------+-----------+----------+-------+ Brachial      Full       Yes       Yes                      +----------+------------+---------+-----------+----------+-------+ Radial        Full                                          +----------+------------+---------+-----------+----------+-------+ Ulnar         Full                                          +----------+------------+---------+-----------+----------+-------+  Cephalic      Full                                          +----------+------------+---------+-----------+----------+-------+ Basilic       None                                   Acute  +----------+------------+---------+-----------+----------+-------+  Summary:  Right: No evidence of thrombosis in the subclavian.  Left: No evidence of deep vein thrombosis in the upper extremity. Findings consistent with acute superficial vein thrombosis involving the left basilic vein.  *See table(s) above for measurements and observations.  Diagnosing physician: Orlie Pollen Electronically signed by Orlie Pollen on 05/30/2022 at 8:05:40 PM.    Final    IR Radiologist Eval & Mgmt  Result Date: 05/17/2022 EXAM: NEW PATIENT OFFICE VISIT CHIEF COMPLAINT: SEE EPIC NOTE HISTORY OF PRESENT ILLNESS: SEE EPIC NOTE REVIEW OF SYSTEMS: SEE EPIC NOTE PHYSICAL EXAMINATION: SEE EPIC NOTE ASSESSMENT AND PLAN: SEE EPIC NOTE Electronically Signed   By: Jacqulynn Cadet M.D.   On: 05/17/2022 15:41     ASSESSMENT & PLAN:   61 y.o. very pleasant male with  1.  Recently diagnosed R-ISS stage III multiple myeloma with extensive bone metastases and patholgiic fracture rt shoulder -Recent bone marrow biopsy and aspiration done 02/06/2022 revealed hypercellular bone marrow with plasma cell neoplasm. 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  Plan -Labs done today were reviewed in detail with the patient CBC CMP  stable Myeloma panel done 06/05/2022 shows increase in M spike to 0.2 g/dL -No significant new toxicities from his current treatment regimen -Continue daratumumab/carfilzomib/Revlimid/dexamethasone regime -Continue Revlimid at 15 mg p.o. daily for 3 weeks on 1 week off with Eliquis for VTE prophylaxis -Continue current pain management with fentanyl patch and as needed oxycodone. -Continue to optimize bowel regimen -Continue monthly Zometa -Continue a Eliquis for VTE prophylaxis -Continue acyclovir for VZV prophylaxis -He will meet with [facility] myeloma transplant team for consideration of bone marrow transplant.  Follow up: Per integrated scheduing  The total time spent in the appointment was 30 minutes*.  All of the patient's questions were answered with apparent satisfaction. The patient knows to call the clinic with any problems, questions or concerns.   Sullivan Lone MD MS AAHIVMS [facility] Hematology/Oncology Physician [facility]  .*Total Encounter Time as defined by the Centers for Medicare and Medicaid Services includes, in addition to the face-to-face time of a patient visit (documented in the note above) non-face-to-face time: obtaining and reviewing outside history, ordering and reviewing medications, tests or procedures, care coordination (communications with other health care professionals or caregivers) and documentation in the medical record.  I, Melene Muller, am acting as scribe for Dr. Sullivan Lone, MD.  .I have reviewed the above documentation for accuracy and completeness, and I agree with the above. Brunetta Genera MD

## 2022-06-11 NOTE — Telephone Encounter (Signed)
Duplicate no need at this time

## 2022-06-13 ENCOUNTER — Other Ambulatory Visit: Payer: Self-pay | Admitting: Hematology

## 2022-06-13 DIAGNOSIS — C9000 Multiple myeloma not having achieved remission: Secondary | ICD-10-CM

## 2022-06-14 ENCOUNTER — Other Ambulatory Visit (HOSPITAL_COMMUNITY): Payer: Self-pay

## 2022-06-17 ENCOUNTER — Other Ambulatory Visit (HOSPITAL_COMMUNITY): Payer: Self-pay

## 2022-06-17 ENCOUNTER — Encounter: Payer: Self-pay | Admitting: Hematology

## 2022-06-18 DIAGNOSIS — C9001 Multiple myeloma in remission: Secondary | ICD-10-CM | POA: Diagnosis not present

## 2022-06-18 DIAGNOSIS — Z01818 Encounter for other preprocedural examination: Secondary | ICD-10-CM | POA: Diagnosis not present

## 2022-06-18 DIAGNOSIS — G893 Neoplasm related pain (acute) (chronic): Secondary | ICD-10-CM | POA: Diagnosis not present

## 2022-06-18 MED FILL — Dexamethasone Sodium Phosphate Inj 100 MG/10ML: INTRAMUSCULAR | Qty: 2 | Status: AC

## 2022-06-19 ENCOUNTER — Other Ambulatory Visit (HOSPITAL_COMMUNITY): Payer: Self-pay

## 2022-06-19 ENCOUNTER — Inpatient Hospital Stay: Payer: BC Managed Care – PPO | Attending: Physician Assistant

## 2022-06-19 ENCOUNTER — Inpatient Hospital Stay: Payer: BC Managed Care – PPO

## 2022-06-19 ENCOUNTER — Inpatient Hospital Stay (HOSPITAL_BASED_OUTPATIENT_CLINIC_OR_DEPARTMENT_OTHER): Payer: BC Managed Care – PPO | Admitting: Hematology

## 2022-06-19 ENCOUNTER — Inpatient Hospital Stay: Payer: BC Managed Care – PPO | Admitting: Dietician

## 2022-06-19 VITALS — BP 143/92 | HR 80 | Temp 97.5°F | Resp 16 | Ht 67.0 in | Wt 194.1 lb

## 2022-06-19 VITALS — BP 131/83 | HR 89 | Temp 98.2°F | Resp 16

## 2022-06-19 DIAGNOSIS — Z5112 Encounter for antineoplastic immunotherapy: Secondary | ICD-10-CM | POA: Insufficient documentation

## 2022-06-19 DIAGNOSIS — Z7189 Other specified counseling: Secondary | ICD-10-CM

## 2022-06-19 DIAGNOSIS — R110 Nausea: Secondary | ICD-10-CM | POA: Insufficient documentation

## 2022-06-19 DIAGNOSIS — Z87891 Personal history of nicotine dependence: Secondary | ICD-10-CM | POA: Insufficient documentation

## 2022-06-19 DIAGNOSIS — M545 Low back pain, unspecified: Secondary | ICD-10-CM | POA: Diagnosis not present

## 2022-06-19 DIAGNOSIS — Z7961 Long term (current) use of immunomodulator: Secondary | ICD-10-CM | POA: Diagnosis not present

## 2022-06-19 DIAGNOSIS — C9000 Multiple myeloma not having achieved remission: Secondary | ICD-10-CM

## 2022-06-19 DIAGNOSIS — R5383 Other fatigue: Secondary | ICD-10-CM | POA: Insufficient documentation

## 2022-06-19 DIAGNOSIS — M549 Dorsalgia, unspecified: Secondary | ICD-10-CM | POA: Insufficient documentation

## 2022-06-19 DIAGNOSIS — Z79891 Long term (current) use of opiate analgesic: Secondary | ICD-10-CM | POA: Diagnosis not present

## 2022-06-19 LAB — CBC WITH DIFFERENTIAL (CANCER CENTER ONLY)
Abs Immature Granulocytes: 0.01 10*3/uL (ref 0.00–0.07)
Basophils Absolute: 0 10*3/uL (ref 0.0–0.1)
Basophils Relative: 1 %
Eosinophils Absolute: 0.1 10*3/uL (ref 0.0–0.5)
Eosinophils Relative: 4 %
HCT: 41.1 % (ref 39.0–52.0)
Hemoglobin: 13.7 g/dL (ref 13.0–17.0)
Immature Granulocytes: 0 %
Lymphocytes Relative: 22 %
Lymphs Abs: 0.8 10*3/uL (ref 0.7–4.0)
MCH: 27.7 pg (ref 26.0–34.0)
MCHC: 33.3 g/dL (ref 30.0–36.0)
MCV: 83.2 fL (ref 80.0–100.0)
Monocytes Absolute: 0.7 10*3/uL (ref 0.1–1.0)
Monocytes Relative: 20 %
Neutro Abs: 1.8 10*3/uL (ref 1.7–7.7)
Neutrophils Relative %: 53 %
Platelet Count: 173 10*3/uL (ref 150–400)
RBC: 4.94 MIL/uL (ref 4.22–5.81)
RDW: 15.3 % (ref 11.5–15.5)
WBC Count: 3.5 10*3/uL — ABNORMAL LOW (ref 4.0–10.5)
nRBC: 0 % (ref 0.0–0.2)

## 2022-06-19 LAB — CMP (CANCER CENTER ONLY)
ALT: 13 U/L (ref 0–44)
AST: 12 U/L — ABNORMAL LOW (ref 15–41)
Albumin: 4 g/dL (ref 3.5–5.0)
Alkaline Phosphatase: 52 U/L (ref 38–126)
Anion gap: 8 (ref 5–15)
BUN: 12 mg/dL (ref 8–23)
CO2: 22 mmol/L (ref 22–32)
Calcium: 9 mg/dL (ref 8.9–10.3)
Chloride: 108 mmol/L (ref 98–111)
Creatinine: 0.81 mg/dL (ref 0.61–1.24)
GFR, Estimated: >60 mL/min (ref >60)
Glucose, Bld: 105 mg/dL — ABNORMAL HIGH (ref 70–99)
Potassium: 3.9 mmol/L (ref 3.5–5.1)
Sodium: 138 mmol/L (ref 135–145)
Total Bilirubin: 0.7 mg/dL (ref 0.3–1.2)
Total Protein: 6.3 g/dL — ABNORMAL LOW (ref 6.5–8.1)

## 2022-06-19 MED ORDER — DIPHENHYDRAMINE HCL 25 MG PO CAPS
50.0000 mg | ORAL_CAPSULE | Freq: Once | ORAL | Status: AC
  Administered 2022-06-19: 50 mg via ORAL
  Filled 2022-06-19: qty 2

## 2022-06-19 MED ORDER — ACETAMINOPHEN 325 MG PO TABS
650.0000 mg | ORAL_TABLET | Freq: Once | ORAL | Status: AC
  Administered 2022-06-19: 650 mg via ORAL
  Filled 2022-06-19: qty 2

## 2022-06-19 MED ORDER — FAMOTIDINE IN NACL 20-0.9 MG/50ML-% IV SOLN
20.0000 mg | Freq: Once | INTRAVENOUS | Status: AC
  Administered 2022-06-19: 20 mg via INTRAVENOUS
  Filled 2022-06-19: qty 50

## 2022-06-19 MED ORDER — DEXTROSE 5 % IV SOLN
70.0000 mg/m2 | Freq: Once | INTRAVENOUS | Status: AC
  Administered 2022-06-19: 140 mg via INTRAVENOUS
  Filled 2022-06-19: qty 60

## 2022-06-19 MED ORDER — SODIUM CHLORIDE 0.9 % IV SOLN
16.0000 mg/kg | Freq: Once | INTRAVENOUS | Status: AC
  Administered 2022-06-19: 1400 mg via INTRAVENOUS
  Filled 2022-06-19: qty 60

## 2022-06-19 MED ORDER — SODIUM CHLORIDE 0.9 % IV SOLN
20.0000 mg | Freq: Once | INTRAVENOUS | Status: AC
  Administered 2022-06-19: 20 mg via INTRAVENOUS
  Filled 2022-06-19: qty 20

## 2022-06-19 MED ORDER — SODIUM CHLORIDE 0.9 % IV SOLN: Freq: Once | INTRAVENOUS | Status: AC

## 2022-06-19 NOTE — Progress Notes (Signed)
Nutrition Follow-up:  Patient with multiple myeloma. He is receiving carfilzomib, daratumumab, revlimid + dexamethasone.   Met with patient during infusion. He reports decreased appetite, fatigue, and overall not feeling great today. Patient says the further along he gets with treatment, the more tired he becomes. Patient has been out of marinol due to shortage. He is hoping pharmacy will be able to fill this today. Patient endorses mild nausea that has lingered over the past week. He has antiemetics at home, but he has not taken these. Pt recalls eating a burger that was undercooked while out with friends last weekend. This did not agree with him. He reports diarrhea and nausea with vomiting the following day. This has resolved other than nausea. Patient tried the Orgain protein powder and is tolerating well. He drinks one protein shake day.    Medications: reviewed   Labs: glucose 105  Anthropometrics: Wt 194 lb 1.6 oz today increased   10/24 - 191 lb 8 oz 10/3 - 193 lb  9/13 - 190 lb 12.8 oz   NUTRITION DIAGNOSIS: Unintentional weight loss stable    INTERVENTION:  Encouraged smaller more frequent meals with adequate calories and protein Recommend pt take nausea medication as prescribed  Continue drinking protein shake, suggested increasing to 2/day as needed with decreased appetite Continue appetite stimulant (marinol) per MD - Pt currently out of medication and awaiting refill Support and encouragement provided     MONITORING, EVALUATION, GOAL: weight trends, intake   NEXT VISIT: To be scheduled with treatment    

## 2022-06-19 NOTE — Patient Instructions (Signed)
[facility] ONCOLOG[facility]   Discharge Instructions: Thank you for choosing [facility] to provide your oncology and hematology care.   If you have a lab appointment with the [facility], please go directly to the [facility] and check in at the registration area.   Wear comfortable clothing and clothing appropriate for easy access to any Portacath or PICC line.   We strive to give you quality time with your provider. You may need to reschedule your appointment if you arrive late (15 or more minutes).  Arriving late affects you and other patients whose appointments are after yours.  Also, if you miss three or more appointments without notifying the office, you may be dismissed from the clinic at the provider's discretion.      For prescription refill requests, have your pharmacy contact our office and allow 72 hours for refills to be completed.    Today you received the following chemotherapy and/or immunotherapy agents: Carfilzomib (Kyprolis) and Daratumumab (Darzalex)      To help prevent nausea and vomiting after your treatment, we encourage you to take your nausea medication as directed.  BELOW ARE SYMPTOMS THAT SHOULD BE REPORTED IMMEDIATELY: *FEVER GREATER THAN 100.4 F (38 C) OR HIGHER *CHILLS OR SWEATING *NAUSEA AND VOMITING THAT IS NOT CONTROLLED WITH YOUR NAUSEA MEDICATION *UNUSUAL SHORTNESS OF BREATH *UNUSUAL BRUISING OR BLEEDING *URINARY PROBLEMS (pain or burning when urinating, or frequent urination) *BOWEL PROBLEMS (unusual diarrhea, constipation, pain near the anus) TENDERNESS IN MOUTH AND THROAT WITH OR WITHOUT PRESENCE OF ULCERS (sore throat, sores in mouth, or a toothache) UNUSUAL RASH, SWELLING OR PAIN  UNUSUAL VAGINAL DISCHARGE OR ITCHING   Items with * indicate a potential emergency and should be followed up as soon as possible or go to the Emergency Department if any problems should occur.  Please show the CHEMOTHERAPY ALERT CARD  or IMMUNOTHERAPY ALERT CARD at check-in to the Emergency Department and triage nurse.  Should you have questions after your visit or need to cancel or reschedule your appointment, please contact [facility]  Dept: [phone]  and follow the prompts.  Office hours are 8:00 a.m. to 4:30 p.m. Monday - Friday. Please note that voicemails left after 4:00 p.m. may not be returned until the following business day.  We are closed weekends and major holidays. You have access to a nurse at all times for urgent questions. Please call the main number to the clinic Dept: [phone] and follow the prompts.   For any non-urgent questions, you may also contact your provider using MyChart. We now offer e-Visits for anyone 39 and older to request care online for non-urgent symptoms. For details visit [email or web address].   Also download the MyChart app! Go to the app store, search "MyChart", open the app, select [facility], and log in with your MyChart username and password.  Masks are optional in the cancer centers. If you would like for your care team to wear a mask while they are taking care of you, please let them know. You may have one support person who is at least 62 years old accompany you for your appointments.

## 2022-06-21 ENCOUNTER — Other Ambulatory Visit: Payer: Self-pay | Admitting: Hematology

## 2022-06-21 DIAGNOSIS — C9000 Multiple myeloma not having achieved remission: Secondary | ICD-10-CM

## 2022-06-23 ENCOUNTER — Other Ambulatory Visit: Payer: Self-pay | Admitting: Hematology

## 2022-06-23 DIAGNOSIS — C9000 Multiple myeloma not having achieved remission: Secondary | ICD-10-CM

## 2022-06-24 ENCOUNTER — Other Ambulatory Visit: Payer: Self-pay

## 2022-06-24 DIAGNOSIS — C9000 Multiple myeloma not having achieved remission: Secondary | ICD-10-CM

## 2022-06-25 MED ORDER — OXYCODONE HCL 10 MG PO TABS: ORAL_TABLET | ORAL | 0 refills | Status: DC

## 2022-06-25 MED ORDER — FENTANYL 25 MCG/HR TD PT72: 1.0000 | MEDICATED_PATCH | TRANSDERMAL | 0 refills | Status: DC

## 2022-06-26 ENCOUNTER — Encounter: Payer: Self-pay | Admitting: Hematology

## 2022-06-26 NOTE — Progress Notes (Signed)
HEMATOLOGY/ONCOLOGY CLINIC NOTE  Date of Service: .06/19/2022  Patient Care Team: Lennie Odor, [place] as PCP - General (Nurse Practitioner)  CHIEF COMPLAINTS/PURPOSE OF CONSULTATION:  Follow-up for continue evaluation and management of multiple myeloma  HISTORY OF PRESENTING ILLNESS:   Anthony [name] is a wonderful 61 y.o. male who has been referred to [place] by Dr Lennie Odor, PA for evaluation and management of newly diagnosed multiple myeloma. He reports He is doing well.  He reports persistent lower back pain that he has had since he was in his 20's. He notes no previous significant injuries. He further notes that he gets chiropractic adjustments and takes Tylenol to manage his symptoms. He notes less back pain when standing up on his right leg and maintaining weight on his right leg.  He reports previous history of smoking and chewing tobacco over 40 years ago.  He had a recent fall back in February of this year with only a pulled muscle. And he notes another fall when chasing his cat where he fell on his right shoulder. He reports pain in right shoulder.   He had a recent COVID-19 infection back in March.  He reports intermittent cough with SOB. He notes he takes 2 Rolaids at night. We discussed potentially trying antacids which he is agreeable to as he will begin steroids soon and was advised of the possible symptoms and side effects from taking steroids.  We discussed CRAB criteria and that he meets at least two of the criterion being  anemia and bone disease.  We discussed getting additional scans for further evaluation which he is agreeable to.  We further discussed starting Daratumumab/Velcade/Dexamethasone/Zometa and Revlimid for treatment which he is agreeable to. We also discussed starting steroids before treatment and taking an acid suppressant which he was also agreeable to.  We discussed getting Senna and taking it as needed for constipation.  Labs done today  were reviewed in detail.  We discussed his recent bone marrow biopsy and aspiration done 02/06/2022.  We discussed PET/CT scan done 01/28/2022.  We discussed CT right shoulder w/o contrast done 11/13/2021  INTERVAL HISTORY:  Anthony [name] is a 61 y.o. male, is here for continued evaluation of and management of multiple myeloma. Since her last clinic visit he has met with Dr. Aris Lot and the transplant team at [facility]. He has had several questions about the transplant process which were answered to best of understanding.  He and his wife were most concerned about triggering of the logistics of having a 24/7 caregiver during transplantation. Patient notes that he is keen to get the transplant and we will work with some other family members to figure out the plan. Pain reasonably controlled with current pain medications and the fentanyl patch and oxycodone prescriptions were refilled. No other acute new medication toxicities. No new infection issues. Labs done today were discussed in details with the patient.   MEDICAL HISTORY:  Past Medical History:  Diagnosis Date   Back pain    Cancer ([place])    Multiple myeloma ([place]) 02/15/2022    SURGICAL HISTORY: Past Surgical History:  Procedure Laterality Date   IR BONE TUMOR(S)RF ABLATION  05/07/2022   IR BONE TUMOR(S)RF ABLATION  05/07/2022   IR BONE TUMOR(S)RF ABLATION  05/07/2022   IR KYPHO EA ADDL LEVEL THORACIC OR LUMBAR  05/07/2022   IR KYPHO LUMBAR INC FX REDUCE BONE BX UNI/BIL CANNULATION INC/IMAGING  05/07/2022   IR KYPHO THORACIC WITH BONE  BIOPSY  05/07/2022   IR RADIOLOGIST EVAL & MGMT  04/23/2022   IR RADIOLOGIST EVAL & MGMT  05/17/2022   IR RADIOLOGIST EVAL & MGMT  06/06/2022    SOCIAL HISTORY: Social History   Socioeconomic History   Marital status: Married    Spouse name: Not on file   Number of children: Not on file   Years of education: Not on file   Highest education level: Not on file   Occupational History   Not on file  Tobacco Use   Smoking status: Never   Smokeless tobacco: Former    Types: Chew, Snuff  Vaping Use   Vaping Use: Never used  Substance and Sexual Activity   Alcohol use: Yes    Alcohol/week: 2.0 standard drinks of alcohol    Types: 2 Glasses of wine per week   Drug use: No   Sexual activity: Yes  Other Topics Concern   Not on file  Social History Narrative   Not on file   Social Determinants of Health   Financial Resource Strain: Low Risk  (05/07/2022)   Overall Financial Resource Strain (CARDIA)    Difficulty of Paying Living Expenses: Not very hard  Food Insecurity: No Food Insecurity (05/07/2022)   Hunger Vital Sign    Worried About Running Out of Food in the Last Year: Never true    Ran Out of Food in the Last Year: Never true  Transportation Needs: No Transportation Needs (05/07/2022)   PRAPARE - Hydrologist (Medical): No    Lack of Transportation (Non-Medical): No  Physical Activity: Not on file  Stress: Not on file  Social Connections: Not on file  Intimate Partner Violence: Unknown (05/07/2022)   Humiliation, Afraid, Rape, and Kick questionnaire    Fear of Current or Ex-Partner: No    Emotionally Abused: No    Physically Abused: No    Sexually Abused: Not on file    FAMILY HISTORY: Family History  Problem Relation Age of Onset   Rheum arthritis Mother    Other Sister        Pre-cancerous uterine mass;    Rheum arthritis Sister    Bone cancer Paternal Grandfather    Brain cancer Maternal Uncle    Brain cancer Maternal Aunt    Osteoporosis Neg Hx     ALLERGIES:  has No Known Allergies.  MEDICATIONS:  Current Outpatient Medications  Medication Sig Dispense Refill   aluminum chloride (DRYSOL) 20 % external solution Apply 1 application topically at bedtime.     apixaban (ELIQUIS) 5 MG TABS tablet Take 1 tablet (5 mg total) by mouth 2 (two) times daily. 60 tablet 5   calcium-vitamin D (OSCAL  WITH D) 250-125 MG-UNIT tablet Take 1 tablet by mouth daily.     cyclobenzaprine (FLEXERIL) 10 MG tablet Take 10 mg by mouth 3 (three) times daily as needed for muscle spasms. (Patient not taking: Reported on 05/21/2022)     diclofenac sodium (VOLTAREN) 1 % GEL Apply 2 g topically as needed.     dronabinol (MARINOL) 5 MG capsule Take 1 capsule (5 mg total) by mouth 2 (two) times daily before a meal. 60 capsule 0   ergocalciferol (VITAMIN D2) 1.25 MG (50000 UT) capsule Take 1 capsule (50,000 Units total) by mouth 2 (two) times daily. 12 capsule 2   esomeprazole (NEXIUM) 40 MG capsule Take 1 capsule (40 mg total) by mouth daily before breakfast. 30 capsule 1   fentaNYL (DURAGESIC) 25  MCG/HR Place 1 patch onto the skin every 3 (three) days. 10 patch 0   furosemide (LASIX) 20 MG tablet Take 1 tablet (20 mg total) by mouth daily. 30 tablet 1   lidocaine (LIDODERM) 5 % Place 1 patch onto the skin daily. Remove & Discard patch within 12 hours or as directed by MD 30 patch 0   methocarbamol (ROBAXIN) 500 MG tablet SMARTSIG:3 Tablet(s) By Mouth Every 6 Hours PRN     methylPREDNISolone (MEDROL DOSEPAK) 4 MG TBPK tablet Take 6 pills by mouth day 1, 5 on day 2, 4 on day 3, 3 on day 4, 2 on day 5, 1 on day 6 21 tablet 0   Multiple Vitamin (MULTIVITAMIN) tablet Take 1 tablet by mouth daily.     Omega-3 Fatty Acids (FISH OIL) 1360 MG CAPS Take 1 capsule by mouth daily.     ondansetron (ZOFRAN) 8 MG tablet Take 1 tablet (8 mg total) by mouth every 8 (eight) hours as needed for nausea or vomiting. 30 tablet 0   Oxycodone HCl 10 MG TABS Take 1/2 TO 1 tablet (5-10 mg total) by mouth every 4 (four) hours as needed for severe pain or moderate pain (cancer related pain). 30 tablet 0   REVLIMID 15 MG capsule TAKE 1 CAPSULE DAILY FOR 14 DAYS ON AND 7 DAYS OFF 14 capsule 0   senna-docusate (SENNA S) 8.6-50 MG tablet Take 2 tablets by mouth at bedtime. 60 tablet 1   terbinafine (LAMISIL) 1 % cream Apply 1 application  topically 2 (two) times daily.     No current facility-administered medications for this visit.   10 Point review of Systems was done is negative except as noted above.  PHYSICAL EXAMINATION: .BP (!) 143/92 (BP Location: Left Arm, Patient Position: Sitting) Comment: nurse notified  Pulse 80   Temp (!) 97.5 F (36.4 C) (Temporal)   Resp 16   Ht _0  (1.702 m)   Wt 194 lb 1.6 oz (88 kg)   SpO2 99%   BMI 30.40 kg/m   NAD GENERAL:alert, in no acute distress and comfortable SKIN: no acute rashes, no significant lesions EYES: conjunctiva are pink and non-injected, sclera anicteric OROPHARYNX: MMM, no exudates, no oropharyngeal erythema or ulceration NECK: supple, no JVD LYMPH:  no palpable lymphadenopathy in the cervical, axillary or inguinal regions LUNGS: clear to auscultation b/l with normal respiratory effort HEART: regular rate & rhythm ABDOMEN:  normoactive bowel sounds , non tender, not distended. Extremity: no pedal edema PSYCH: alert & oriented x 3 with fluent speech NEURO: no focal motor/sensory deficits   LABORATORY DATA:  I have reviewed the data as listed .    Latest Ref Rng & Units 06/19/2022    8:55 AM 06/11/2022    1:20 PM 06/05/2022    8:09 AM  CBC  WBC 4.0 - 10.5 K/uL 3.5  6.2  4.0   Hemoglobin 13.0 - 17.0 g/dL 13.7  15.3  13.5   Hematocrit 39.0 - 52.0 % 41.1  46.2  40.6   Platelets 150 - 400 K/uL 173  144  185       Latest Ref Rng & Units 06/19/2022    8:55 AM 06/11/2022    1:20 PM 06/05/2022    8:09 AM  CMP  Glucose 70 - 99 mg/dL 105  113  110   BUN 8 - 23 mg/dL _1 Creatinine 0.61 - 1.24 mg/dL 0.81  0.82  0.82   Sodium 135 -  145 mmol/L 138  135  135   Potassium 3.5 - 5.1 mmol/L 3.9  4.3  4.2   Chloride 98 - 111 mmol/L 108  105  105   CO2 22 - 32 mmol/L _0 Calcium 8.9 - 10.3 mg/dL 9.0  8.3  8.7   Total Protein 6.5 - 8.1 g/dL 6.3  6.4  5.9   Total Bilirubin 0.3 - 1.2 mg/dL 0.7  0.7  0.8   Alkaline Phos 38 - 126 U/L 52  58   66   AST 15 - 41 U/L _1 ALT 0 - 44 U/L _2 02/06/2022 Molecular pathology   RADIOGRAPHIC STUDIES: I have personally reviewed the radiological images as listed and agreed with the findings in the report. IR Radiologist Eval & Mgmt  Result Date: 06/06/2022 EXAM: PATIENT OFFICE VISIT CHIEF COMPLAINT: SEE EPIC NOTE HISTORY OF PRESENT ILLNESS: SEE EPIC NOTE REVIEW OF SYSTEMS: SEE EPIC NOTE PHYSICAL EXAMINATION: SEE EPIC NOTE ASSESSMENT AND PLAN: SEE EPIC NOTE Electronically Signed   By: Jacqulynn Cadet M.D.   On: 06/06/2022 15:05   VAS [place] UPPER EXTREMITY VENOUS DUPLEX  Result Date: 05/30/2022 UPPER VENOUS STUDY  Patient Name:  ALEXX MCBURNEY  Date of Exam:   05/30/2022 Medical Rec #: [id number]        Accession #:    [id number] Date of Birth: July 12, 1961        Patient Gender: M Patient Age:   23 years Exam Location:  [facility] Procedure:      VAS [place] UPPER EXTREMITY VENOUS DUPLEX Referring Phys: Sherol Dade --------------------------------------------------------------------------------  Indications: Pain Risk Factors: Cancer. Anticoagulation: Eliquis. Comparison Study: No prior studies. Performing Technologist: Oliver Hum RVT  Examination Guidelines: A complete evaluation includes B-mode imaging, spectral Doppler, color Doppler, and power Doppler as needed of all accessible portions of each vessel. Bilateral testing is considered an integral part of a complete examination. Limited examinations for reoccurring indications may be performed as noted.  Right Findings: +----------+------------+---------+-----------+----------+-------+ RIGHT     CompressiblePhasicitySpontaneousPropertiesSummary +----------+------------+---------+-----------+----------+-------+ Subclavian    Full       Yes       Yes                      +----------+------------+---------+-----------+----------+-------+  Left Findings:  +----------+------------+---------+-----------+----------+-------+ LEFT      CompressiblePhasicitySpontaneousPropertiesSummary +----------+------------+---------+-----------+----------+-------+ IJV           Full       Yes       Yes                      +----------+------------+---------+-----------+----------+-------+ Subclavian    Full       Yes       Yes                      +----------+------------+---------+-----------+----------+-------+ Axillary      Full       Yes       Yes                      +----------+------------+---------+-----------+----------+-------+ Brachial      Full       Yes       Yes                      +----------+------------+---------+-----------+----------+-------+ Radial  Full                                          +----------+------------+---------+-----------+----------+-------+ Ulnar         Full                                          +----------+------------+---------+-----------+----------+-------+ Cephalic      Full                                          +----------+------------+---------+-----------+----------+-------+ Basilic       None                                   Acute  +----------+------------+---------+-----------+----------+-------+  Summary:  Right: No evidence of thrombosis in the subclavian.  Left: No evidence of deep vein thrombosis in the upper extremity. Findings consistent with acute superficial vein thrombosis involving the left basilic vein.  *See table(s) above for measurements and observations.  Diagnosing physician: Orlie Pollen Electronically signed by Orlie Pollen on 05/30/2022 at 8:05:40 PM.    Final      ASSESSMENT & PLAN:   61 y.o. very pleasant male with  1.  Recently diagnosed R-ISS stage III multiple myeloma with extensive bone metastases and patholgiic fracture rt shoulder -Recent bone marrow biopsy and aspiration done 02/06/2022 revealed hypercellular bone marrow  with plasma cell neoplasm. 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  Plan -Labs done today were discussed in detail with the patient CBC and CMP stable Myeloma panel from 06/05/2022 shows an M spike of 0.2 g/dL of IgG kappa consistent with VGPR. -We shall continue his daratumumab/carfilzomib/Revlimid/dexamethasone regimen -He has met with Dr. Aris Lot and prefers to proceed with consolidative autologous hematopoietic stem cell transplant. -We will need repeat bone marrow biopsy and PET CT scan with his transplant team prior to proceeding with transplant. -Once the logistics of the transplant are worked out we will adjust his treatment and holding his treatment accordingly. -Continue Revlimid at 15 mg p.o. daily for 3 weeks on 1 week off with Eliquis for VTE prophylaxis -Refilled his fentanyl patch and as needed oxycodone. -Continue to optimize bowel regimen -Continue monthly Zometa -Continue a Eliquis for VTE prophylaxis -Continue acyclovir for VZV prophylaxis -He will meet with [facility] myeloma transplant team for consideration of bone marrow transplant.  Follow up: Follow-up per integrated scheduling.  The total time spent in the appointment was 32 minutes*.  All of the patient's questions were answered with apparent satisfaction. The patient knows to call the clinic with any problems, questions or concerns.   Sullivan Lone MD MS AAHIVMS [facility] Hematology/Oncology Physician [facility]  .*Total Encounter Time as defined by the Centers for Medicare and Medicaid Services includes, in addition to the face-to-face time of a patient visit (documented in the note above) non-face-to-face time: obtaining and reviewing outside history, ordering and reviewing medications, tests or procedures, care coordination (communications with other health care professionals or caregivers) and documentation in the medical record.

## 2022-06-30 ENCOUNTER — Other Ambulatory Visit: Payer: Self-pay

## 2022-07-01 ENCOUNTER — Other Ambulatory Visit: Payer: Self-pay

## 2022-07-01 DIAGNOSIS — C9000 Multiple myeloma not having achieved remission: Secondary | ICD-10-CM

## 2022-07-01 MED ORDER — REVLIMID 15 MG PO CAPS: ORAL_CAPSULE | ORAL | 0 refills | Status: DC

## 2022-07-02 ENCOUNTER — Other Ambulatory Visit: Payer: Self-pay | Admitting: Hematology

## 2022-07-02 ENCOUNTER — Other Ambulatory Visit: Payer: Self-pay

## 2022-07-02 DIAGNOSIS — C9000 Multiple myeloma not having achieved remission: Secondary | ICD-10-CM

## 2022-07-02 MED FILL — Dexamethasone Sodium Phosphate Inj 100 MG/10ML: INTRAMUSCULAR | Qty: 2 | Status: AC

## 2022-07-03 ENCOUNTER — Encounter: Payer: Self-pay | Admitting: Hematology

## 2022-07-03 ENCOUNTER — Inpatient Hospital Stay: Payer: BC Managed Care – PPO

## 2022-07-03 ENCOUNTER — Other Ambulatory Visit: Payer: Self-pay

## 2022-07-03 VITALS — BP 127/78 | HR 97 | Temp 98.4°F | Resp 18 | Wt 195.0 lb

## 2022-07-03 DIAGNOSIS — Z7961 Long term (current) use of immunomodulator: Secondary | ICD-10-CM | POA: Diagnosis not present

## 2022-07-03 DIAGNOSIS — Z7189 Other specified counseling: Secondary | ICD-10-CM

## 2022-07-03 DIAGNOSIS — Z5112 Encounter for antineoplastic immunotherapy: Secondary | ICD-10-CM | POA: Diagnosis not present

## 2022-07-03 DIAGNOSIS — Z87891 Personal history of nicotine dependence: Secondary | ICD-10-CM | POA: Diagnosis not present

## 2022-07-03 DIAGNOSIS — M549 Dorsalgia, unspecified: Secondary | ICD-10-CM | POA: Diagnosis not present

## 2022-07-03 DIAGNOSIS — M545 Low back pain, unspecified: Secondary | ICD-10-CM | POA: Diagnosis not present

## 2022-07-03 DIAGNOSIS — R110 Nausea: Secondary | ICD-10-CM | POA: Diagnosis not present

## 2022-07-03 DIAGNOSIS — Z79891 Long term (current) use of opiate analgesic: Secondary | ICD-10-CM | POA: Diagnosis not present

## 2022-07-03 DIAGNOSIS — C9000 Multiple myeloma not having achieved remission: Secondary | ICD-10-CM | POA: Diagnosis not present

## 2022-07-03 DIAGNOSIS — R5383 Other fatigue: Secondary | ICD-10-CM | POA: Diagnosis not present

## 2022-07-03 LAB — CBC WITH DIFFERENTIAL (CANCER CENTER ONLY)
Abs Immature Granulocytes: 0.01 10*3/uL (ref 0.00–0.07)
Basophils Absolute: 0 10*3/uL (ref 0.0–0.1)
Basophils Relative: 1 %
Eosinophils Absolute: 0.2 10*3/uL (ref 0.0–0.5)
Eosinophils Relative: 6 %
HCT: 41.3 % (ref 39.0–52.0)
Hemoglobin: 13.8 g/dL (ref 13.0–17.0)
Immature Granulocytes: 0 %
Lymphocytes Relative: 21 %
Lymphs Abs: 0.9 10*3/uL (ref 0.7–4.0)
MCH: 28.2 pg (ref 26.0–34.0)
MCHC: 33.4 g/dL (ref 30.0–36.0)
MCV: 84.5 fL (ref 80.0–100.0)
Monocytes Absolute: 0.6 10*3/uL (ref 0.1–1.0)
Monocytes Relative: 14 %
Neutro Abs: 2.4 10*3/uL (ref 1.7–7.7)
Neutrophils Relative %: 58 %
Platelet Count: 215 10*3/uL (ref 150–400)
RBC: 4.89 MIL/uL (ref 4.22–5.81)
RDW: 15.3 % (ref 11.5–15.5)
WBC Count: 4.2 10*3/uL (ref 4.0–10.5)
nRBC: 0 % (ref 0.0–0.2)

## 2022-07-03 LAB — CMP (CANCER CENTER ONLY)
ALT: 14 U/L (ref 0–44)
AST: 13 U/L — ABNORMAL LOW (ref 15–41)
Albumin: 4.2 g/dL (ref 3.5–5.0)
Alkaline Phosphatase: 54 U/L (ref 38–126)
Anion gap: 7 (ref 5–15)
BUN: 11 mg/dL (ref 8–23)
CO2: 24 mmol/L (ref 22–32)
Calcium: 9.1 mg/dL (ref 8.9–10.3)
Chloride: 105 mmol/L (ref 98–111)
Creatinine: 0.85 mg/dL (ref 0.61–1.24)
GFR, Estimated: >60 mL/min (ref >60)
Glucose, Bld: 138 mg/dL — ABNORMAL HIGH (ref 70–99)
Potassium: 4.1 mmol/L (ref 3.5–5.1)
Sodium: 136 mmol/L (ref 135–145)
Total Bilirubin: 0.8 mg/dL (ref 0.3–1.2)
Total Protein: 6.1 g/dL — ABNORMAL LOW (ref 6.5–8.1)

## 2022-07-03 MED ORDER — REVLIMID 15 MG PO CAPS: ORAL_CAPSULE | ORAL | 0 refills | Status: DC

## 2022-07-03 MED ORDER — FAMOTIDINE IN NACL 20-0.9 MG/50ML-% IV SOLN
20.0000 mg | Freq: Once | INTRAVENOUS | Status: AC
  Administered 2022-07-03: 20 mg via INTRAVENOUS
  Filled 2022-07-03: qty 50

## 2022-07-03 MED ORDER — SODIUM CHLORIDE 0.9% FLUSH: 10.0000 mL | INTRAVENOUS | Status: DC | PRN

## 2022-07-03 MED ORDER — SODIUM CHLORIDE 0.9 % IV SOLN
16.0000 mg/kg | Freq: Once | INTRAVENOUS | Status: AC
  Administered 2022-07-03: 1400 mg via INTRAVENOUS
  Filled 2022-07-03: qty 60

## 2022-07-03 MED ORDER — DEXTROSE 5 % IV SOLN
70.0000 mg/m2 | Freq: Once | INTRAVENOUS | Status: AC
  Administered 2022-07-03: 140 mg via INTRAVENOUS
  Filled 2022-07-03: qty 60

## 2022-07-03 MED ORDER — ACETAMINOPHEN 325 MG PO TABS
650.0000 mg | ORAL_TABLET | Freq: Once | ORAL | Status: AC
  Administered 2022-07-03: 650 mg via ORAL
  Filled 2022-07-03: qty 2

## 2022-07-03 MED ORDER — SODIUM CHLORIDE 0.9 % IV SOLN: Freq: Once | INTRAVENOUS | Status: AC

## 2022-07-03 MED ORDER — DIPHENHYDRAMINE HCL 25 MG PO CAPS
50.0000 mg | ORAL_CAPSULE | Freq: Once | ORAL | Status: AC
  Administered 2022-07-03: 50 mg via ORAL
  Filled 2022-07-03: qty 2

## 2022-07-03 MED ORDER — SODIUM CHLORIDE 0.9 % IV SOLN
20.0000 mg | Freq: Once | INTRAVENOUS | Status: AC
  Administered 2022-07-03: 20 mg via INTRAVENOUS
  Filled 2022-07-03: qty 20

## 2022-07-03 MED ORDER — HEPARIN SOD (PORK) LOCK FLUSH 100 UNIT/ML IV SOLN: 500.0000 [IU] | Freq: Once | INTRAVENOUS | Status: DC | PRN

## 2022-07-03 MED ORDER — SODIUM CHLORIDE 0.9 % IV SOLN
20.0000 mg | Freq: Once | INTRAVENOUS | Status: DC
  Filled 2022-07-03: qty 2

## 2022-07-03 NOTE — Patient Instructions (Signed)
[facility] ONCOLOGY   Discharge Instructions: Thank you for choosing [facility] to provide your oncology and hematology care.   If you have a lab appointment with the [facility], please go directly to the [facility] and check in at the registration area.   Wear comfortable clothing and clothing appropriate for easy access to any Portacath or PICC line.   We strive to give you quality time with your provider. You may need to reschedule your appointment if you arrive late (15 or more minutes).  Arriving late affects you and other patients whose appointments are after yours.  Also, if you miss three or more appointments without notifying the office, you may be dismissed from the clinic at the provider's discretion.      For prescription refill requests, have your pharmacy contact our office and allow 72 hours for refills to be completed.    Today you received the following chemotherapy and/or immunotherapy agents: Carfilzomib (Kyprolis) and Daratumumab (Darzalex)      To help prevent nausea and vomiting after your treatment, we encourage you to take your nausea medication as directed.  BELOW ARE SYMPTOMS THAT SHOULD BE REPORTED IMMEDIATELY: *FEVER GREATER THAN 100.4 F (38 C) OR HIGHER *CHILLS OR SWEATING *NAUSEA AND VOMITING THAT IS NOT CONTROLLED WITH YOUR NAUSEA MEDICATION *UNUSUAL SHORTNESS OF BREATH *UNUSUAL BRUISING OR BLEEDING *URINARY PROBLEMS (pain or burning when urinating, or frequent urination) *BOWEL PROBLEMS (unusual diarrhea, constipation, pain near the anus) TENDERNESS IN MOUTH AND THROAT WITH OR WITHOUT PRESENCE OF ULCERS (sore throat, sores in mouth, or a toothache) UNUSUAL RASH, SWELLING OR PAIN  UNUSUAL VAGINAL DISCHARGE OR ITCHING   Items with * indicate a potential emergency and should be followed up as soon as possible or go to the Emergency Department if any problems should occur.  Please show the CHEMOTHERAPY ALERT CARD  or IMMUNOTHERAPY ALERT CARD at check-in to the Emergency Department and triage nurse.  Should you have questions after your visit or need to cancel or reschedule your appointment, please contact [facility]  Dept: [phone]  and follow the prompts.  Office hours are 8:00 a.m. to 4:30 p.m. Monday - Friday. Please note that voicemails left after 4:00 p.m. may not be returned until the following business day.  We are closed weekends and major holidays. You have access to a nurse at all times for urgent questions. Please call the main number to the clinic Dept: [phone] and follow the prompts.   For any non-urgent questions, you may also contact your provider using MyChart. We now offer e-Visits for anyone 85 and older to request care online for non-urgent symptoms. For details visit [email or web address].   Also download the MyChart app! Go to the app store, search "MyChart", open the app, select [facility], and log in with your MyChart username and password.  Masks are optional in the cancer centers. If you would like for your care team to wear a mask while they are taking care of you, please let them know. You may have one support person who is at least 62 years old accompany you for your appointments.

## 2022-07-04 ENCOUNTER — Other Ambulatory Visit: Payer: Self-pay | Admitting: Hematology

## 2022-07-04 DIAGNOSIS — C9000 Multiple myeloma not having achieved remission: Secondary | ICD-10-CM

## 2022-07-04 MED ORDER — OXYCODONE HCL 10 MG PO TABS: ORAL_TABLET | ORAL | 0 refills | Status: DC

## 2022-07-08 LAB — MULTIPLE MYELOMA PANEL, SERUM
Albumin SerPl Elph-Mcnc: 3.6 g/dL (ref 2.9–4.4)
Albumin/Glob SerPl: 1.9 — ABNORMAL HIGH (ref 0.7–1.7)
Alpha 1: 0.2 g/dL (ref 0.0–0.4)
Alpha2 Glob SerPl Elph-Mcnc: 0.5 g/dL (ref 0.4–1.0)
B-Globulin SerPl Elph-Mcnc: 0.8 g/dL (ref 0.7–1.3)
Gamma Glob SerPl Elph-Mcnc: 0.4 g/dL (ref 0.4–1.8)
Globulin, Total: 1.9 g/dL — ABNORMAL LOW (ref 2.2–3.9)
IgA: 26 mg/dL — ABNORMAL LOW (ref 61–437)
IgG (Immunoglobin G), Serum: 432 mg/dL — ABNORMAL LOW (ref 603–1613)
IgM (Immunoglobulin M), Srm: 19 mg/dL — ABNORMAL LOW (ref 20–172)
M Protein SerPl Elph-Mcnc: 0.1 g/dL — ABNORMAL HIGH
Total Protein ELP: 5.5 g/dL — ABNORMAL LOW (ref 6.0–8.5)

## 2022-07-09 MED FILL — Dexamethasone Sodium Phosphate Inj 100 MG/10ML: INTRAMUSCULAR | Qty: 2 | Status: AC

## 2022-07-10 ENCOUNTER — Inpatient Hospital Stay: Payer: BC Managed Care – PPO

## 2022-07-10 ENCOUNTER — Telehealth: Payer: Self-pay

## 2022-07-10 ENCOUNTER — Inpatient Hospital Stay (HOSPITAL_BASED_OUTPATIENT_CLINIC_OR_DEPARTMENT_OTHER): Payer: BC Managed Care – PPO | Admitting: Hematology

## 2022-07-10 ENCOUNTER — Other Ambulatory Visit: Payer: Self-pay

## 2022-07-10 VITALS — BP 146/85 | HR 63 | Temp 97.8°F | Resp 18 | Ht 67.0 in | Wt 195.2 lb

## 2022-07-10 DIAGNOSIS — M549 Dorsalgia, unspecified: Secondary | ICD-10-CM | POA: Diagnosis not present

## 2022-07-10 DIAGNOSIS — C9000 Multiple myeloma not having achieved remission: Secondary | ICD-10-CM

## 2022-07-10 DIAGNOSIS — Z7189 Other specified counseling: Secondary | ICD-10-CM | POA: Diagnosis not present

## 2022-07-10 DIAGNOSIS — R110 Nausea: Secondary | ICD-10-CM | POA: Diagnosis not present

## 2022-07-10 DIAGNOSIS — Z79891 Long term (current) use of opiate analgesic: Secondary | ICD-10-CM | POA: Diagnosis not present

## 2022-07-10 DIAGNOSIS — Z7961 Long term (current) use of immunomodulator: Secondary | ICD-10-CM | POA: Diagnosis not present

## 2022-07-10 DIAGNOSIS — M545 Low back pain, unspecified: Secondary | ICD-10-CM | POA: Diagnosis not present

## 2022-07-10 DIAGNOSIS — Z5112 Encounter for antineoplastic immunotherapy: Secondary | ICD-10-CM | POA: Diagnosis not present

## 2022-07-10 DIAGNOSIS — Z87891 Personal history of nicotine dependence: Secondary | ICD-10-CM | POA: Diagnosis not present

## 2022-07-10 DIAGNOSIS — R5383 Other fatigue: Secondary | ICD-10-CM | POA: Diagnosis not present

## 2022-07-10 LAB — CMP (CANCER CENTER ONLY)
ALT: 15 U/L (ref 0–44)
AST: 15 U/L (ref 15–41)
Albumin: 4.2 g/dL (ref 3.5–5.0)
Alkaline Phosphatase: 46 U/L (ref 38–126)
Anion gap: 5 (ref 5–15)
BUN: 13 mg/dL (ref 8–23)
CO2: 22 mmol/L (ref 22–32)
Calcium: 8.9 mg/dL (ref 8.9–10.3)
Chloride: 109 mmol/L (ref 98–111)
Creatinine: 0.71 mg/dL (ref 0.61–1.24)
GFR, Estimated: >60 mL/min (ref >60)
Glucose, Bld: 110 mg/dL — ABNORMAL HIGH (ref 70–99)
Potassium: 4.4 mmol/L (ref 3.5–5.1)
Sodium: 136 mmol/L (ref 135–145)
Total Bilirubin: 1 mg/dL (ref 0.3–1.2)
Total Protein: 7 g/dL (ref 6.5–8.1)

## 2022-07-10 LAB — CBC WITH DIFFERENTIAL (CANCER CENTER ONLY)
Abs Immature Granulocytes: 0.01 10*3/uL (ref 0.00–0.07)
Basophils Absolute: 0 10*3/uL (ref 0.0–0.1)
Basophils Relative: 0 %
Eosinophils Absolute: 0 10*3/uL (ref 0.0–0.5)
Eosinophils Relative: 1 %
HCT: 42.8 % (ref 39.0–52.0)
Hemoglobin: 14.4 g/dL (ref 13.0–17.0)
Immature Granulocytes: 0 %
Lymphocytes Relative: 19 %
Lymphs Abs: 0.8 10*3/uL (ref 0.7–4.0)
MCH: 28.2 pg (ref 26.0–34.0)
MCHC: 33.6 g/dL (ref 30.0–36.0)
MCV: 83.9 fL (ref 80.0–100.0)
Monocytes Absolute: 0.8 10*3/uL (ref 0.1–1.0)
Monocytes Relative: 21 %
Neutro Abs: 2.3 10*3/uL (ref 1.7–7.7)
Neutrophils Relative %: 59 %
Platelet Count: 134 10*3/uL — ABNORMAL LOW (ref 150–400)
RBC: 5.1 MIL/uL (ref 4.22–5.81)
RDW: 15.3 % (ref 11.5–15.5)
WBC Count: 3.9 10*3/uL — ABNORMAL LOW (ref 4.0–10.5)
nRBC: 0 % (ref 0.0–0.2)

## 2022-07-10 MED ORDER — ONDANSETRON HCL 8 MG PO TABS: 8.0000 mg | ORAL_TABLET | Freq: Three times a day (TID) | ORAL | 0 refills | Status: DC | PRN

## 2022-07-10 MED ORDER — METHOCARBAMOL 500 MG PO TABS: 500.0000 mg | ORAL_TABLET | Freq: Four times a day (QID) | ORAL | Status: DC | PRN

## 2022-07-10 NOTE — Progress Notes (Signed)
HEMATOLOGY/ONCOLOGY CLINIC NOTE  Date of Service: 07/10/22  Patient Care Team: Lennie Odor, [place] as PCP - General (Nurse Practitioner)  CHIEF COMPLAINTS/PURPOSE OF CONSULTATION:  Follow-up for continue evaluation and management of multiple myeloma  HISTORY OF PRESENTING ILLNESS:   Anthony [name] is a wonderful 61 y.o. male who has been referred to [place] by Dr Lennie Odor, PA for evaluation and management of newly diagnosed multiple myeloma. He reports He is doing well.  He reports persistent lower back pain that he has had since he was in his 20's. He notes no previous significant injuries. He further notes that he gets chiropractic adjustments and takes Tylenol to manage his symptoms. He notes less back pain when standing up on his right leg and maintaining weight on his right leg.  He reports previous history of smoking and chewing tobacco over 40 years ago.  He had a recent fall back in February of this year with only a pulled muscle. And he notes another fall when chasing his cat where he fell on his right shoulder. He reports pain in right shoulder.   He had a recent COVID-19 infection back in March.  He reports intermittent cough with SOB. He notes he takes 2 Rolaids at night. We discussed potentially trying antacids which he is agreeable to as he will begin steroids soon and was advised of the possible symptoms and side effects from taking steroids.  We discussed CRAB criteria and that he meets at least two of the criterion being  anemia and bone disease.  We discussed getting additional scans for further evaluation which he is agreeable to.  We further discussed starting Daratumumab/Velcade/Dexamethasone/Zometa and Revlimid for treatment which he is agreeable to. We also discussed starting steroids before treatment and taking an acid suppressant which he was also agreeable to.  We discussed getting Senna and taking it as needed for constipation.  Labs done today were  reviewed in detail.  We discussed his recent bone marrow biopsy and aspiration done 02/06/2022.  We discussed PET/CT scan done 01/28/2022.  We discussed CT right shoulder w/o contrast done 11/13/2021  INTERVAL HISTORY:  Anthony [name] is a 61 y.o. male, is here for continued evaluation of and management of multiple myeloma.  Patient was last seen by me on 06/19/2022 and was doing well overall without any new medical concerns.   Patient is here with his wife during today's visit. Patient report he is doing well overall.   He had an transplant education visit with surgeon team on 07/03/2022 and notes that it was good information but little overwhelming. He notes that he is still not sure if he will get an transplant or not.   Patient had questions regarding the myeloma transplant, MRD negative vs positive, and other treatment option, which were answered.   Patient had certain questions about genetic testing results, which were answered and results printed out.   He reports that he felt discomfort, chills, shortness of breath, mild congestion, severe nausea, and mild body pain today morning around 3 am. He took Zofran for nausea, which helped relieve his nausea. He denies fever and abnormal bowl moment. Patient reports he had same kind of feeling about 4 months ago.   However, he denies abdominal pain, fever, nausea, fever, chills, leg swelling, and shortness of breath currently. He denies insomnia.   Patient complains of fatigue/tiredness after changing his treatment duration.   Patient and his wife wants to hold off treatment today due to nausea.  MEDICAL HISTORY:  Past Medical History:  Diagnosis Date   Back pain    Cancer ([place])    Multiple myeloma ([place]) 02/15/2022    SURGICAL HISTORY: Past Surgical History:  Procedure Laterality Date   IR BONE TUMOR(S)RF ABLATION  05/07/2022   IR BONE TUMOR(S)RF ABLATION  05/07/2022   IR BONE TUMOR(S)RF ABLATION  05/07/2022   IR KYPHO EA  ADDL LEVEL THORACIC OR LUMBAR  05/07/2022   IR KYPHO LUMBAR INC FX REDUCE BONE BX UNI/BIL CANNULATION INC/IMAGING  05/07/2022   IR KYPHO THORACIC WITH BONE BIOPSY  05/07/2022   IR RADIOLOGIST EVAL & MGMT  04/23/2022   IR RADIOLOGIST EVAL & MGMT  05/17/2022   IR RADIOLOGIST EVAL & MGMT  06/06/2022    SOCIAL HISTORY: Social History   Socioeconomic History   Marital status: Married    Spouse name: Not on file   Number of children: Not on file   Years of education: Not on file   Highest education level: Not on file  Occupational History   Not on file  Tobacco Use   Smoking status: Never   Smokeless tobacco: Former    Types: Chew, Snuff  Vaping Use   Vaping Use: Never used  Substance and Sexual Activity   Alcohol use: Yes    Alcohol/week: 2.0 standard drinks of alcohol    Types: 2 Glasses of wine per week   Drug use: No   Sexual activity: Yes  Other Topics Concern   Not on file  Social History Narrative   Not on file   Social Determinants of Health   Financial Resource Strain: Low Risk  (05/07/2022)   Overall Financial Resource Strain (CARDIA)    Difficulty of Paying Living Expenses: Not very hard  Food Insecurity: No Food Insecurity (05/07/2022)   Hunger Vital Sign    Worried About Running Out of Food in the Last Year: Never true    Ran Out of Food in the Last Year: Never true  Transportation Needs: No Transportation Needs (05/07/2022)   PRAPARE - Hydrologist (Medical): No    Lack of Transportation (Non-Medical): No  Physical Activity: Not on file  Stress: Not on file  Social Connections: Not on file  Intimate Partner Violence: Unknown (05/07/2022)   Humiliation, Afraid, Rape, and Kick questionnaire    Fear of Current or Ex-Partner: No    Emotionally Abused: No    Physically Abused: No    Sexually Abused: Not on file    FAMILY HISTORY: Family History  Problem Relation Age of Onset   Rheum arthritis Mother    Other Sister         Pre-cancerous uterine mass;    Rheum arthritis Sister    Bone cancer Paternal Grandfather    Brain cancer Maternal Uncle    Brain cancer Maternal Aunt    Osteoporosis Neg Hx     ALLERGIES:  has No Known Allergies.  MEDICATIONS:  Current Outpatient Medications  Medication Sig Dispense Refill   aluminum chloride (DRYSOL) 20 % external solution Apply 1 application topically at bedtime.     apixaban (ELIQUIS) 5 MG TABS tablet Take 1 tablet (5 mg total) by mouth 2 (two) times daily. 60 tablet 5   calcium-vitamin D (OSCAL WITH D) 250-125 MG-UNIT tablet Take 1 tablet by mouth daily.     cyclobenzaprine (FLEXERIL) 10 MG tablet Take 10 mg by mouth 3 (three) times daily as needed for muscle spasms. (Patient not taking: Reported on  05/21/2022)     diclofenac sodium (VOLTAREN) 1 % GEL Apply 2 g topically as needed.     dronabinol (MARINOL) 5 MG capsule Take 1 capsule (5 mg total) by mouth 2 (two) times daily before a meal. 60 capsule 0   ergocalciferol (VITAMIN D2) 1.25 MG (50000 UT) capsule Take 1 capsule (50,000 Units total) by mouth 2 (two) times daily. 12 capsule 2   esomeprazole (NEXIUM) 40 MG capsule Take 1 capsule (40 mg total) by mouth daily before breakfast. 30 capsule 1   fentaNYL (DURAGESIC) 25 MCG/HR Place 1 patch onto the skin every 3 (three) days. 10 patch 0   furosemide (LASIX) 20 MG tablet Take 1 tablet (20 mg total) by mouth daily. 30 tablet 1   lidocaine (LIDODERM) 5 % Place 1 patch onto the skin daily. Remove & Discard patch within 12 hours or as directed by MD 30 patch 0   methocarbamol (ROBAXIN) 500 MG tablet SMARTSIG:3 Tablet(s) By Mouth Every 6 Hours PRN     methylPREDNISolone (MEDROL DOSEPAK) 4 MG TBPK tablet Take 6 pills by mouth day 1, 5 on day 2, 4 on day 3, 3 on day 4, 2 on day 5, 1 on day 6 21 tablet 0   Multiple Vitamin (MULTIVITAMIN) tablet Take 1 tablet by mouth daily.     Omega-3 Fatty Acids (FISH OIL) 1360 MG CAPS Take 1 capsule by mouth daily.     ondansetron  (ZOFRAN) 8 MG tablet Take 1 tablet (8 mg total) by mouth every 8 (eight) hours as needed for nausea or vomiting. 30 tablet 0   Oxycodone HCl 10 MG TABS Take 1/2 TO 1 tablet (5-10 mg total) by mouth every 4 (four) hours as needed for severe pain or moderate pain (cancer related pain). 30 tablet 0   REVLIMID 15 MG capsule TAKE 1 CAPSULE DAILY FOR 14 DAYS ON AND 7 DAYS OFF 14 capsule 0   senna-docusate (SENNA S) 8.6-50 MG tablet Take 2 tablets by mouth at bedtime. 60 tablet 1   terbinafine (LAMISIL) 1 % cream Apply 1 application topically 2 (two) times daily.     No current facility-administered medications for this visit.   10 Point review of Systems was done is negative except as noted above.  PHYSICAL EXAMINATION: .BP (!) 146/85 (BP Location: Left Arm, Patient Position: Sitting) Comment: nurse notified  Pulse 63   Temp 97.8 F (36.6 C) (Temporal)   Resp 18   Ht _0  (1.702 m)   Wt 195 lb 3.2 oz (88.5 kg)   SpO2 96%   BMI 30.57 kg/m   NAD GENERAL:alert, in no acute distress and comfortable SKIN: no acute rashes, no significant lesions EYES: conjunctiva are pink and non-injected, sclera anicteric OROPHARYNX: MMM, no exudates, no oropharyngeal erythema or ulceration NECK: supple, no JVD LYMPH:  no palpable lymphadenopathy in the cervical, axillary or inguinal regions LUNGS: clear to auscultation b/l with normal respiratory effort HEART: regular rate & rhythm ABDOMEN:  normoactive bowel sounds , non tender, not distended. Extremity: no pedal edema PSYCH: alert & oriented x 3 with fluent speech NEURO: no focal motor/sensory deficits   LABORATORY DATA:  I have reviewed the data as listed .    Latest Ref Rng & Units 07/10/2022   11:30 AM 07/03/2022    9:12 AM 06/19/2022    8:55 AM  CBC  WBC 4.0 - 10.5 K/uL 3.9  4.2  3.5   Hemoglobin 13.0 - 17.0 g/dL 14.4  13.8  13.7  Hematocrit 39.0 - 52.0 % 42.8  41.3  41.1   Platelets 150 - 400 K/uL 134  215  173       Latest Ref Rng &  Units 07/10/2022   11:30 AM 07/03/2022    9:12 AM 06/19/2022    8:55 AM  CMP  Glucose 70 - 99 mg/dL 110  138  105   BUN 8 - 23 mg/dL _0 Creatinine 0.61 - 1.24 mg/dL 0.71  0.85  0.81   Sodium 135 - 145 mmol/L 136  136  138   Potassium 3.5 - 5.1 mmol/L 4.4  4.1  3.9   Chloride 98 - 111 mmol/L 109  105  108   CO2 22 - 32 mmol/L _1 Calcium 8.9 - 10.3 mg/dL 8.9  9.1  9.0   Total Protein 6.5 - 8.1 g/dL 7.0  6.1  6.3   Total Bilirubin 0.3 - 1.2 mg/dL 1.0  0.8  0.7   Alkaline Phos 38 - 126 U/L 46  54  52   AST 15 - 41 U/L _2 ALT 0 - 44 U/L _3 02/06/2022 Molecular pathology   RADIOGRAPHIC STUDIES: I have personally reviewed the radiological images as listed and agreed with the findings in the report. No results found.   ASSESSMENT & PLAN:   61 y.o. very pleasant male with  1.  Recently diagnosed R-ISS stage III multiple myeloma with extensive bone metastases and patholgiic fracture rt shoulder -Recent bone marrow biopsy and aspiration done 02/06/2022 revealed hypercellular bone marrow with plasma cell neoplasm. 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  Plan: Myeloma panel from 07/03/2022 shows an M spike of 0.1 g/dL of IgG kappa consistent with VGPR. -We shall continue his daratumumab/carfilzomib/Revlimid/dexamethasone regimen, but hold off today due to nausea. And abdominal upset concerning for a possible stomach virus. -Discussed lab results from today 07/10/2022 with the patient and his wife. Labs show WBC of 3.9 K, hemoglobin of 14.4 K, and platelets of 134. CMP stable.  -will schedule for PET scan and bone marrow biopsy.  -Discussed option for transplant with the patient and his wife.  -Answered all patient's questions regarding the myeloma transplant and response of the cancer with the transplant.  -Discussed information on MRD negative vs positive.  -Discussed the outcomes of myeloma  transplant vs outcomes of not getting the transplant.  -Educated the patient and his wife about the risks and benefits of transplant.  -Discussed the genetic testing results with the patient and his wife. -We will hold treatment today due to nausea.    FOLLOW-UP: -Holding treatment today -continue treatment as scheduled from next week per integrated scheduling  The total time spent in the appointment was 30 minutes* .  All of the patient's questions were answered with apparent satisfaction. The patient knows to call the clinic with any problems, questions or concerns.   Sullivan Lone MD MS AAHIVMS [facility] Hematology/Oncology Physician [facility]  .*Total Encounter Time as defined by the Centers for Medicare and Medicaid Services includes, in addition to the face-to-face time of a patient visit (documented in the note above) non-face-to-face time: obtaining and reviewing outside history, ordering and reviewing medications, tests or procedures, care coordination (communications with other health care professionals or caregivers) and documentation in the medical record.   I, Cleda Mccreedy, am acting as  a scribe for Sullivan Lone, MD.  .I have reviewed the above documentation for accuracy and completeness, and I agree with the above. Brunetta Genera MD

## 2022-07-10 NOTE — Telephone Encounter (Signed)
RN Karmine Kauer spoke with Anthony [name] he states he is almost out of his robaxin and is down to the '500mg'$  does taking 3-4x a week. RN spoke with Dr. Laurence Ferrari received verbal consent to refill Anthony [name] prescription. He is to take '500mg'$  robaxin every 6 hours PRN.

## 2022-07-16 ENCOUNTER — Other Ambulatory Visit: Payer: Self-pay | Admitting: Hematology

## 2022-07-16 DIAGNOSIS — C9000 Multiple myeloma not having achieved remission: Secondary | ICD-10-CM

## 2022-07-16 MED ORDER — OXYCODONE HCL 10 MG PO TABS: ORAL_TABLET | ORAL | 0 refills | Status: DC

## 2022-07-16 MED FILL — Dexamethasone Sodium Phosphate Inj 100 MG/10ML: INTRAMUSCULAR | Qty: 2 | Status: AC

## 2022-07-17 ENCOUNTER — Inpatient Hospital Stay: Payer: BC Managed Care – PPO

## 2022-07-17 ENCOUNTER — Other Ambulatory Visit: Payer: Self-pay | Admitting: Hematology

## 2022-07-17 ENCOUNTER — Encounter: Payer: Self-pay | Admitting: Hematology

## 2022-07-17 VITALS — BP 130/87 | HR 88 | Temp 98.5°F | Resp 18 | Wt 195.8 lb

## 2022-07-17 DIAGNOSIS — C9000 Multiple myeloma not having achieved remission: Secondary | ICD-10-CM

## 2022-07-17 DIAGNOSIS — Z79891 Long term (current) use of opiate analgesic: Secondary | ICD-10-CM | POA: Diagnosis not present

## 2022-07-17 DIAGNOSIS — R110 Nausea: Secondary | ICD-10-CM | POA: Diagnosis not present

## 2022-07-17 DIAGNOSIS — R5383 Other fatigue: Secondary | ICD-10-CM | POA: Diagnosis not present

## 2022-07-17 DIAGNOSIS — Z7189 Other specified counseling: Secondary | ICD-10-CM

## 2022-07-17 DIAGNOSIS — Z7961 Long term (current) use of immunomodulator: Secondary | ICD-10-CM | POA: Diagnosis not present

## 2022-07-17 DIAGNOSIS — Z5112 Encounter for antineoplastic immunotherapy: Secondary | ICD-10-CM | POA: Diagnosis not present

## 2022-07-17 DIAGNOSIS — M545 Low back pain, unspecified: Secondary | ICD-10-CM | POA: Diagnosis not present

## 2022-07-17 DIAGNOSIS — Z87891 Personal history of nicotine dependence: Secondary | ICD-10-CM | POA: Diagnosis not present

## 2022-07-17 DIAGNOSIS — M549 Dorsalgia, unspecified: Secondary | ICD-10-CM | POA: Diagnosis not present

## 2022-07-17 LAB — CMP (CANCER CENTER ONLY)
ALT: 12 U/L (ref 0–44)
AST: 13 U/L — ABNORMAL LOW (ref 15–41)
Albumin: 4.5 g/dL (ref 3.5–5.0)
Alkaline Phosphatase: 56 U/L (ref 38–126)
Anion gap: 8 (ref 5–15)
BUN: 13 mg/dL (ref 8–23)
CO2: 24 mmol/L (ref 22–32)
Calcium: 9.2 mg/dL (ref 8.9–10.3)
Chloride: 105 mmol/L (ref 98–111)
Creatinine: 0.82 mg/dL (ref 0.61–1.24)
GFR, Estimated: >60 mL/min (ref >60)
Glucose, Bld: 85 mg/dL (ref 70–99)
Potassium: 3.8 mmol/L (ref 3.5–5.1)
Sodium: 137 mmol/L (ref 135–145)
Total Bilirubin: 0.9 mg/dL (ref 0.3–1.2)
Total Protein: 6.7 g/dL (ref 6.5–8.1)

## 2022-07-17 LAB — CBC WITH DIFFERENTIAL (CANCER CENTER ONLY)
Abs Immature Granulocytes: 0.01 10*3/uL (ref 0.00–0.07)
Basophils Absolute: 0 10*3/uL (ref 0.0–0.1)
Basophils Relative: 1 %
Eosinophils Absolute: 0.5 10*3/uL (ref 0.0–0.5)
Eosinophils Relative: 11 %
HCT: 43.9 % (ref 39.0–52.0)
Hemoglobin: 14.6 g/dL (ref 13.0–17.0)
Immature Granulocytes: 0 %
Lymphocytes Relative: 28 %
Lymphs Abs: 1.2 10*3/uL (ref 0.7–4.0)
MCH: 28.9 pg (ref 26.0–34.0)
MCHC: 33.3 g/dL (ref 30.0–36.0)
MCV: 86.8 fL (ref 80.0–100.0)
Monocytes Absolute: 0.3 10*3/uL (ref 0.1–1.0)
Monocytes Relative: 8 %
Neutro Abs: 2.1 10*3/uL (ref 1.7–7.7)
Neutrophils Relative %: 52 %
Platelet Count: 189 10*3/uL (ref 150–400)
RBC: 5.06 MIL/uL (ref 4.22–5.81)
RDW: 15.3 % (ref 11.5–15.5)
WBC Count: 4.1 10*3/uL (ref 4.0–10.5)
nRBC: 0 % (ref 0.0–0.2)

## 2022-07-17 MED ORDER — ACETAMINOPHEN 325 MG PO TABS
650.0000 mg | ORAL_TABLET | Freq: Once | ORAL | Status: AC
  Administered 2022-07-17: 650 mg via ORAL
  Filled 2022-07-17: qty 2

## 2022-07-17 MED ORDER — SODIUM CHLORIDE 0.9 % IV SOLN: Freq: Once | INTRAVENOUS | Status: AC

## 2022-07-17 MED ORDER — SODIUM CHLORIDE 0.9 % IV SOLN: 8.0000 mg | Freq: Once | INTRAVENOUS | Status: DC

## 2022-07-17 MED ORDER — FAMOTIDINE IN NACL 20-0.9 MG/50ML-% IV SOLN
20.0000 mg | Freq: Once | INTRAVENOUS | Status: AC
  Administered 2022-07-17: 20 mg via INTRAVENOUS
  Filled 2022-07-17: qty 50

## 2022-07-17 MED ORDER — DIPHENHYDRAMINE HCL 25 MG PO CAPS
50.0000 mg | ORAL_CAPSULE | Freq: Once | ORAL | Status: AC
  Administered 2022-07-17: 50 mg via ORAL
  Filled 2022-07-17: qty 2

## 2022-07-17 MED ORDER — SODIUM CHLORIDE 0.9 % IV SOLN
20.0000 mg | Freq: Once | INTRAVENOUS | Status: AC
  Administered 2022-07-17: 20 mg via INTRAVENOUS
  Filled 2022-07-17: qty 20

## 2022-07-17 MED ORDER — ZOLEDRONIC ACID 4 MG/100ML IV SOLN
4.0000 mg | Freq: Once | INTRAVENOUS | Status: AC
  Administered 2022-07-17: 4 mg via INTRAVENOUS
  Filled 2022-07-17: qty 100

## 2022-07-17 MED ORDER — SODIUM CHLORIDE 0.9 % IV SOLN
16.0000 mg/kg | Freq: Once | INTRAVENOUS | Status: AC
  Administered 2022-07-17: 1400 mg via INTRAVENOUS
  Filled 2022-07-17: qty 60

## 2022-07-17 MED ORDER — DEXTROSE 5 % IV SOLN
70.0000 mg/m2 | Freq: Once | INTRAVENOUS | Status: AC
  Administered 2022-07-17: 140 mg via INTRAVENOUS
  Filled 2022-07-17: qty 60

## 2022-07-17 MED ORDER — ONDANSETRON HCL 4 MG/2ML IJ SOLN
8.0000 mg | Freq: Once | INTRAMUSCULAR | Status: AC
  Administered 2022-07-17: 8 mg via INTRAVENOUS
  Filled 2022-07-17: qty 4

## 2022-07-17 NOTE — Patient Instructions (Signed)
[facility]COL[facility]  Discharge Instructions: Thank you for choosing [facility] to provide your oncology and hematology care.   If you have a lab appointment with the [facility], please go directly to the [facility] and check in at the registration area.   Wear comfortable clothing and clothing appropriate for easy access to any Portacath or PICC line.   We strive to give you quality time with your provider. You may need to reschedule your appointment if you arrive late (15 or more minutes).  Arriving late affects you and other patients whose appointments are after yours.  Also, if you miss three or more appointments without notifying the office, you may be dismissed from the clinic at the provider's discretion.      For prescription refill requests, have your pharmacy contact our office and allow 72 hours for refills to be completed.    Today you received the following chemotherapy and/or immunotherapy agents: carfilzomib and daratumumab      To help prevent nausea and vomiting after your treatment, we encourage you to take your nausea medication as directed.  BELOW ARE SYMPTOMS THAT SHOULD BE REPORTED IMMEDIATELY: *FEVER GREATER THAN 100.4 F (38 C) OR HIGHER *CHILLS OR SWEATING *NAUSEA AND VOMITING THAT IS NOT CONTROLLED WITH YOUR NAUSEA MEDICATION *UNUSUAL SHORTNESS OF BREATH *UNUSUAL BRUISING OR BLEEDING *URINARY PROBLEMS (pain or burning when urinating, or frequent urination) *BOWEL PROBLEMS (unusual diarrhea, constipation, pain near the anus) TENDERNESS IN MOUTH AND THROAT WITH OR WITHOUT PRESENCE OF ULCERS (sore throat, sores in mouth, or a toothache) UNUSUAL RASH, SWELLING OR PAIN  UNUSUAL VAGINAL DISCHARGE OR ITCHING   Items with * indicate a potential emergency and should be followed up as soon as possible or go to the Emergency Department if any problems should occur.  Please show the CHEMOTHERAPY ALERT CARD or IMMUNOTHERAPY ALERT  CARD at check-in to the Emergency Department and triage nurse.  Should you have questions after your visit or need to cancel or reschedule your appointment, please contact [facility]  Dept: [phone]  and follow the prompts.  Office hours are 8:00 a.m. to 4:30 p.m. Monday - Friday. Please note that voicemails left after 4:00 p.m. may not be returned until the following business day.  We are closed weekends and major holidays. You have access to a nurse at all times for urgent questions. Please call the main number to the clinic Dept: [phone] and follow the prompts.   For any non-urgent questions, you may also contact your provider using MyChart. We now offer e-Visits for anyone 21 and older to request care online for non-urgent symptoms. For details visit [email or web address].   Also download the MyChart app! Go to the app store, search "MyChart", open the app, select [facility], and log in with your MyChart username and password.  Masks are optional in the cancer centers. If you would like for your care team to wear a mask while they are taking care of you, please let them know. You may have one support person who is at least 62 years old accompany you for your appointments.

## 2022-07-19 ENCOUNTER — Telehealth: Payer: Self-pay

## 2022-07-19 NOTE — Telephone Encounter (Signed)
CSW made follow up phone call to patient.  He was unable to talk and asked for CSW to call another time.

## 2022-07-22 ENCOUNTER — Other Ambulatory Visit: Payer: Self-pay | Admitting: Hematology

## 2022-07-22 DIAGNOSIS — C9000 Multiple myeloma not having achieved remission: Secondary | ICD-10-CM

## 2022-07-23 ENCOUNTER — Other Ambulatory Visit: Payer: BC Managed Care – PPO

## 2022-07-23 ENCOUNTER — Other Ambulatory Visit: Payer: Self-pay | Admitting: Hematology

## 2022-07-23 DIAGNOSIS — C9000 Multiple myeloma not having achieved remission: Secondary | ICD-10-CM

## 2022-07-23 MED ORDER — REVLIMID 15 MG PO CAPS: ORAL_CAPSULE | ORAL | 0 refills | Status: DC

## 2022-07-24 ENCOUNTER — Other Ambulatory Visit: Payer: Self-pay | Admitting: Hematology

## 2022-07-24 DIAGNOSIS — C9000 Multiple myeloma not having achieved remission: Secondary | ICD-10-CM

## 2022-07-26 ENCOUNTER — Inpatient Hospital Stay: Payer: BC Managed Care – PPO | Attending: Physician Assistant

## 2022-07-26 DIAGNOSIS — R5383 Other fatigue: Secondary | ICD-10-CM | POA: Insufficient documentation

## 2022-07-26 DIAGNOSIS — Z7961 Long term (current) use of immunomodulator: Secondary | ICD-10-CM | POA: Insufficient documentation

## 2022-07-26 DIAGNOSIS — M545 Low back pain, unspecified: Secondary | ICD-10-CM | POA: Insufficient documentation

## 2022-07-26 DIAGNOSIS — Z87891 Personal history of nicotine dependence: Secondary | ICD-10-CM | POA: Insufficient documentation

## 2022-07-26 DIAGNOSIS — R0602 Shortness of breath: Secondary | ICD-10-CM | POA: Insufficient documentation

## 2022-07-26 DIAGNOSIS — R110 Nausea: Secondary | ICD-10-CM | POA: Insufficient documentation

## 2022-07-26 DIAGNOSIS — D3501 Benign neoplasm of right adrenal gland: Secondary | ICD-10-CM | POA: Insufficient documentation

## 2022-07-26 DIAGNOSIS — R059 Cough, unspecified: Secondary | ICD-10-CM | POA: Insufficient documentation

## 2022-07-26 DIAGNOSIS — I700 Atherosclerosis of aorta: Secondary | ICD-10-CM | POA: Insufficient documentation

## 2022-07-26 DIAGNOSIS — Z5112 Encounter for antineoplastic immunotherapy: Secondary | ICD-10-CM | POA: Insufficient documentation

## 2022-07-26 DIAGNOSIS — Z23 Encounter for immunization: Secondary | ICD-10-CM | POA: Insufficient documentation

## 2022-07-26 DIAGNOSIS — C9000 Multiple myeloma not having achieved remission: Secondary | ICD-10-CM | POA: Insufficient documentation

## 2022-07-26 DIAGNOSIS — Z79891 Long term (current) use of opiate analgesic: Secondary | ICD-10-CM | POA: Insufficient documentation

## 2022-07-26 NOTE — Progress Notes (Signed)
[facility] CSW Progress Note  Clinical Education officer, museum contacted patient by phone to assess needs.  Patient reported feeling well last week.  He has several medical appointments coming up and is hopeful they will be positive.    He has been approved for long term disability.  He will contact CSW with any questions about social security disability.      Rodman Pickle Yarely Bebee, LCSW

## 2022-07-29 ENCOUNTER — Other Ambulatory Visit: Payer: Self-pay | Admitting: Radiology

## 2022-07-29 ENCOUNTER — Other Ambulatory Visit: Payer: Self-pay

## 2022-07-29 ENCOUNTER — Ambulatory Visit (HOSPITAL_COMMUNITY)
Admission: RE | Admit: 2022-07-29 | Discharge: 2022-07-29 | Disposition: A | Discharge status: Home / Self Care | Payer: BC Managed Care – PPO | Source: Ambulatory Visit | Attending: Hematology | Admitting: Hematology

## 2022-07-29 DIAGNOSIS — C9000 Multiple myeloma not having achieved remission: Secondary | ICD-10-CM

## 2022-07-29 LAB — GLUCOSE, CAPILLARY: Glucose-Capillary: 87 mg/dL (ref 70–99)

## 2022-07-29 MED ORDER — FENTANYL 25 MCG/HR TD PT72: 1.0000 | MEDICATED_PATCH | TRANSDERMAL | 0 refills | Status: DC

## 2022-07-29 MED ORDER — FLUDEOXYGLUCOSE F - 18 (FDG) INJECTION
9.6000 | Freq: Once | INTRAVENOUS | Status: AC
  Administered 2022-07-29: 9.72 via INTRAVENOUS

## 2022-07-29 NOTE — H&P (Incomplete)
Referring Physician(s): Brunetta Genera  Supervising Physician: Mir, Sharen Heck  Patient Status:  [facility] OP  Chief Complaint: "I'm here for a bone marrow biopsy"   Subjective: Pt known to IR service from bone marrow biopsy in 2018 and T11/12 and L2 biopsies, ablations and KP on 05/07/22. He has a hx of multiple myeloma and presents today for CT guided bone marrow biopsy to evaluate treatment response. He denies fever,HA,CP,dyspnea, cough, abd pain,N/V or bleeding. He does have mid to low back pain.    Past Medical History:  Diagnosis Date   Back pain    Cancer ([place])    Multiple myeloma ([place]) 02/15/2022   Past Surgical History:  Procedure Laterality Date   IR BONE TUMOR(S)RF ABLATION  05/07/2022   IR BONE TUMOR(S)RF ABLATION  05/07/2022   IR BONE TUMOR(S)RF ABLATION  05/07/2022   IR KYPHO EA ADDL LEVEL THORACIC OR LUMBAR  05/07/2022   IR KYPHO LUMBAR INC FX REDUCE BONE BX UNI/BIL CANNULATION INC/IMAGING  05/07/2022   IR KYPHO THORACIC WITH BONE BIOPSY  05/07/2022   IR RADIOLOGIST EVAL & MGMT  04/23/2022   IR RADIOLOGIST EVAL & MGMT  05/17/2022   IR RADIOLOGIST EVAL & MGMT  06/06/2022      Allergies: Patient has no known allergies.  Medications: Prior to Admission medications   Medication Sig Start Date End Date Taking? Authorizing Provider  aluminum chloride (DRYSOL) 20 % external solution Apply 1 application topically at bedtime.    [provider]  apixaban (ELIQUIS) 5 MG TABS tablet Take 1 tablet (5 mg total) by mouth 2 (two) times daily. 04/10/22   Brunetta Genera, MD  calcium-vitamin D (OSCAL WITH D) 250-125 MG-UNIT tablet Take 1 tablet by mouth daily.    [provider]  cyclobenzaprine (FLEXERIL) 10 MG tablet Take 10 mg by mouth 3 (three) times daily as needed for muscle spasms. Patient not taking: Reported on 05/21/2022    [provider]  diclofenac sodium (VOLTAREN) 1 % GEL Apply 2 g topically as needed.    [provider]   dronabinol (MARINOL) 5 MG capsule Take 1 capsule (5 mg total) by mouth 2 (two) times daily before a meal. 06/11/22   Brunetta Genera, MD  ergocalciferol (VITAMIN D2) 1.25 MG (50000 UT) capsule Take 1 capsule (50,000 Units total) by mouth 2 (two) times daily. 04/03/22   Brunetta Genera, MD  esomeprazole (NEXIUM) 40 MG capsule Take 1 capsule (40 mg total) by mouth daily before breakfast. 06/13/22   Brunetta Genera, MD  fentaNYL (DURAGESIC) 25 MCG/HR Place 1 patch onto the skin every 3 (three) days. 06/25/22   Brunetta Genera, MD  furosemide (LASIX) 20 MG tablet Take 1 tablet (20 mg total) by mouth daily. 03/11/22   Brunetta Genera, MD  lidocaine (LIDODERM) 5 % Place 1 patch onto the skin daily. Remove & Discard patch within 12 hours or as directed by MD 03/11/22   Brunetta Genera, MD  methocarbamol (ROBAXIN) 500 MG tablet SMARTSIG:3 Tablet(s) By Mouth Every 6 Hours PRN 05/17/22   [provider]  methylPREDNISolone (MEDROL DOSEPAK) 4 MG TBPK tablet Take 6 pills by mouth day 1, 5 on day 2, 4 on day 3, 3 on day 4, 2 on day 5, 1 on day 6 03/08/22   Walisiewicz, Verline Lema E, PA-C  Multiple Vitamin (MULTIVITAMIN) tablet Take 1 tablet by mouth daily.    [provider]  Omega-3 Fatty Acids (FISH OIL) 1360 MG CAPS Take  1 capsule by mouth daily.    [provider]  ondansetron (ZOFRAN) 8 MG tablet Take 1 tablet (8 mg total) by mouth every 8 (eight) hours as needed for nausea or vomiting. 07/10/22   Brunetta Genera, MD  Oxycodone HCl 10 MG TABS Take 1/2 TO 1 tablet (5-10 mg total) by mouth every 4 (four) hours as needed for severe pain or moderate pain (cancer related pain). 07/16/22   Brunetta Genera, MD  REVLIMID 15 MG capsule TAKE 1 CAPSULE DAILY FOR 14 DAYS ON AND 7 DAYS OFF 07/23/22   Brunetta Genera, MD  REVLIMID 15 MG capsule TAKE 1 CAPSULE DAILY FOR 14 DAYS ON AND 7 DAYS OFF 07/23/22   Brunetta Genera, MD  senna-docusate (SENNA S) 8.6-50  MG tablet Take 2 tablets by mouth at bedtime. 02/15/22   Brunetta Genera, MD  terbinafine (LAMISIL) 1 % cream Apply 1 application topically 2 (two) times daily.    [provider]  prochlorperazine (COMPAZINE) 10 MG tablet Take 1 tablet (10 mg total) by mouth every 6 (six) hours as needed (Nausea or vomiting). 02/15/22 04/01/22  Brunetta Genera, MD     Vital Signs:pending    Physical Exam awake/alert; chest- CTA bilat; heart- RRR; abd- somewhat taut but NT,+BS; no LE edema  Imaging: No results found.  Labs:  CBC: Recent Labs    06/19/22 0855 07/03/22 0912 07/10/22 1130 07/17/22 0942  WBC 3.5* 4.2 3.9* 4.1  HGB 13.7 13.8 14.4 14.6  HCT 41.1 41.3 42.8 43.9  PLT 173 215 134* 189    COAGS: No results for input(s): "INR", "APTT" in the last 8760 hours.  BMP: Recent Labs    06/19/22 0855 07/03/22 0912 07/10/22 1130 07/17/22 0942  NA 138 136 136 137  K 3.9 4.1 4.4 3.8  CL 108 105 109 105  CO2 _0 GLUCOSE 105* 138* 110* 85  BUN _1 CALCIUM 9.0 9.1 8.9 9.2  CREATININE 0.81 0.85 0.71 0.82  GFRNONAA >60 >60 >60 >60    LIVER FUNCTION TESTS: Recent Labs    06/19/22 0855 07/03/22 0912 07/10/22 1130 07/17/22 0942  BILITOT 0.7 0.8 1.0 0.9  AST 12* 13* 15 13*  ALT _2 ALKPHOS 52 54 46 56  PROT 6.3* 6.1* 7.0 6.7  ALBUMIN 4.0 4.2 4.2 4.5    Assessment and Plan: Pt known to IR service from bone marrow biopsy in 2018 and T11/12 and L2 biopsies, ablations and KP on 05/07/22. He has a hx of multiple myeloma and presents today for CT guided bone marrow biopsy to evaluate treatment response. Risks and benefits of procedure was discussed with the patient  including, but not limited to bleeding, infection, damage to adjacent structures or low yield requiring additional tests.  All of the questions were answered and there is agreement to proceed.  Consent signed and in chart.  Pt ate breakfast this am around 0630, therefore will  plan for IV versed only; pt on fentanyl patch/oxycodone  Electronically Signed: D. Rowe Robert, PA-C 07/29/2022, 11:38 AM   I spent a total of 20 minutes at the the patient's bedside AND on the patient's hospital floor or unit, greater than 50% of which was counseling/coordinating care for CT guided bone marrow biopsy

## 2022-07-30 ENCOUNTER — Ambulatory Visit (HOSPITAL_COMMUNITY)
Admission: RE | Admit: 2022-07-30 | Discharge: 2022-07-30 | Disposition: A | Discharge status: Home / Self Care | Payer: BC Managed Care – PPO | Source: Ambulatory Visit | Attending: Hematology | Admitting: Hematology

## 2022-07-30 ENCOUNTER — Encounter (HOSPITAL_COMMUNITY): Payer: Self-pay

## 2022-07-30 ENCOUNTER — Other Ambulatory Visit (HOSPITAL_COMMUNITY): Payer: Self-pay

## 2022-07-30 ENCOUNTER — Ambulatory Visit: Payer: BC Managed Care – PPO | Admitting: Hematology and Oncology

## 2022-07-30 ENCOUNTER — Other Ambulatory Visit: Payer: Self-pay

## 2022-07-30 DIAGNOSIS — D72819 Decreased white blood cell count, unspecified: Secondary | ICD-10-CM | POA: Insufficient documentation

## 2022-07-30 DIAGNOSIS — C9000 Multiple myeloma not having achieved remission: Secondary | ICD-10-CM | POA: Insufficient documentation

## 2022-07-30 DIAGNOSIS — Z1379 Encounter for other screening for genetic and chromosomal anomalies: Secondary | ICD-10-CM | POA: Diagnosis not present

## 2022-07-30 LAB — CBC WITH DIFFERENTIAL/PLATELET
Abs Immature Granulocytes: 0 10*3/uL (ref 0.00–0.07)
Basophils Absolute: 0 10*3/uL (ref 0.0–0.1)
Basophils Relative: 1 %
Eosinophils Absolute: 0.1 10*3/uL (ref 0.0–0.5)
Eosinophils Relative: 3 %
HCT: 47.6 % (ref 39.0–52.0)
Hemoglobin: 14.6 g/dL (ref 13.0–17.0)
Immature Granulocytes: 0 %
Lymphocytes Relative: 33 %
Lymphs Abs: 1.2 10*3/uL (ref 0.7–4.0)
MCH: 28.1 pg (ref 26.0–34.0)
MCHC: 30.7 g/dL (ref 30.0–36.0)
MCV: 91.5 fL (ref 80.0–100.0)
Monocytes Absolute: 0.7 10*3/uL (ref 0.1–1.0)
Monocytes Relative: 17 %
Neutro Abs: 1.7 10*3/uL (ref 1.7–7.7)
Neutrophils Relative %: 46 %
Platelets: 179 10*3/uL (ref 150–400)
RBC: 5.2 MIL/uL (ref 4.22–5.81)
RDW: 14.2 % (ref 11.5–15.5)
WBC: 3.7 10*3/uL — ABNORMAL LOW (ref 4.0–10.5)
nRBC: 0 % (ref 0.0–0.2)

## 2022-07-30 MED ORDER — SODIUM CHLORIDE 0.9 % IV SOLN: INTRAVENOUS | Status: DC

## 2022-07-30 MED ORDER — FLUMAZENIL 0.5 MG/5ML IV SOLN
INTRAVENOUS | Status: AC
  Filled 2022-07-30: qty 5

## 2022-07-30 MED ORDER — FENTANYL 25 MCG/HR TD PT72
1.0000 | MEDICATED_PATCH | TRANSDERMAL | 0 refills | Status: DC
  Filled 2022-07-30: qty 10, 30d supply, fill #0

## 2022-07-30 MED ORDER — MIDAZOLAM HCL 2 MG/2ML IJ SOLN
INTRAMUSCULAR | Status: AC
  Filled 2022-07-30: qty 4

## 2022-07-30 MED ORDER — LIDOCAINE HCL (PF) 1 % IJ SOLN
INTRAMUSCULAR | Status: AC | PRN
  Administered 2022-07-30: 10 mL

## 2022-07-30 MED ORDER — BUPIVACAINE HCL (PF) 0.25 % IJ SOLN
INTRAMUSCULAR | Status: AC
  Administered 2022-07-30: 5 mL
  Filled 2022-07-30: qty 30

## 2022-07-30 MED ORDER — MIDAZOLAM HCL 2 MG/2ML IJ SOLN
INTRAMUSCULAR | Status: AC | PRN
  Administered 2022-07-30: .5 mg via INTRAVENOUS
  Administered 2022-07-30: 1 mg via INTRAVENOUS
  Administered 2022-07-30: .5 mg via INTRAVENOUS

## 2022-07-30 MED FILL — Dexamethasone Sodium Phosphate Inj 100 MG/10ML: INTRAMUSCULAR | Qty: 2 | Status: AC

## 2022-07-30 NOTE — Procedures (Signed)
Interventional Radiology Procedure Note  Indication: Multiple Myeloma  Procedure: CT guided aspirate and core biopsy of right iliac bone  Complications: None  Bleeding: Minimal  Anothy Bufano, MD [phone]   

## 2022-07-30 NOTE — Sedation Documentation (Signed)
Sample obtained 

## 2022-07-30 NOTE — Discharge Instructions (Signed)
Please call Interventional Radiology clinic [phone] with any questions or concerns.  You may remove your dressing and shower tomorrow.  Moderate Conscious Sedation, Adult, Care After This sheet gives you information about how to care for yourself after your procedure. Your health care provider may also give you more specific instructions. If you have problems or questions, contact your health careprovider. What can I expect after the procedure? After the procedure, it is common to have: Sleepiness for several hours. Impaired judgment for several hours. Difficulty with balance. Vomiting if you eat too soon. Follow these instructions at home: For the time period you were told by your health care provider: Rest. Do not participate in activities where you could fall or become injured. Do not drive or use machinery. Do not drink alcohol. Do not take sleeping pills or medicines that cause drowsiness. Do not make important decisions or sign legal documents. Do not take care of children on your own. Eating and drinking  Follow the diet recommended by your health care provider. Drink enough fluid to keep your urine pale yellow. If you vomit: Drink water, juice, or soup when you can drink without vomiting. Make sure you have little or no nausea before eating solid foods.  General instructions Take over-the-counter and prescription medicines only as told by your health care provider. Have a responsible adult stay with you for the time you are told. It is important to have someone help care for you until you are awake and alert. Do not smoke. Keep all follow-up visits as told by your health care provider. This is important. Contact a health care provider if: You are still sleepy or having trouble with balance after 24 hours. You feel light-headed. You keep feeling nauseous or you keep vomiting. You develop a rash. You have a fever. You have redness or swelling around the IV  site. Get help right away if: You have trouble breathing. You have new-onset confusion at home. Summary After the procedure, it is common to feel sleepy, have impaired judgment, or feel nauseous if you eat too soon. Rest after you get home. Know the things you should not do after the procedure. Follow the diet recommended by your health care provider and drink enough fluid to keep your urine pale yellow. Get help right away if you have trouble breathing or new-onset confusion at home. This information is not intended to replace advice given to you by your health care provider. Make sure you discuss any questions you have with your healthcare provider. Document Revised: 12/03/2019 Document Reviewed: 07/01/2019 Elsevier Patient Education  2022 [place].  Bone Marrow Aspiration and Bone Marrow Biopsy, Adult, Care After This sheet gives you information about how to care for yourself after your procedure. Your health care provider may also give you more specific instructions. If you have problems or questions, contact your health careprovider. What can I expect after the procedure? After the procedure, it is common to have: Mild pain and tenderness. Swelling. Bruising. Follow these instructions at home: Puncture site care Follow instructions from your health care provider about how to take care of the puncture site. Make sure you: Wash your hands with soap and water before and after you change your bandage (dressing). If soap and water are not available, use hand sanitizer. Change your dressing as told by your health care provider. Check your puncture site every day for signs of infection. Check for: More redness, swelling, or pain. Fluid or blood. Warmth. Pus or a bad smell.  Activity Return to your normal activities as told by your health care provider. Ask your health care provider what activities are safe for you. Do not lift anything that is heavier than 10 lb (4.5 kg), or the  limit that you are told, until your health care provider says that it is safe. Do not drive for 24 hours if you were given a sedative during your procedure. General instructions Take over-the-counter and prescription medicines only as told by your health care provider. Do not take baths, swim, or use a hot tub until your health care provider approves. Ask your health care provider if you may take showers. You may only be allowed to take sponge baths. If directed, put ice on the affected area. To do this: Put ice in a plastic bag. Place a towel between your skin and the bag. Leave the ice on for 20 minutes, 2-3 times a day. Keep all follow-up visits as told by your health care provider. This is important.  Contact a health care provider if: Your pain is not controlled with medicine. You have a fever. You have more redness, swelling, or pain around the puncture site. You have fluid or blood coming from the puncture site. Your puncture site feels warm to the touch. You have pus or a bad smell coming from the puncture site. Summary After the procedure, it is common to have mild pain, tenderness, swelling, and bruising. Follow instructions from your health care provider about how to take care of the puncture site and what activities are safe for you. Take over-the-counter and prescription medicines only as told by your health care provider. Contact a health care provider if you have any signs of infection, such as fluid or blood coming from the puncture site. This information is not intended to replace advice given to you by your health care provider. Make sure you discuss any questions you have with your healthcare provider. Document Revised: 12/22/2018 Document Reviewed: 12/22/2018 Elsevier Patient Education  2022 [employer] 

## 2022-07-31 ENCOUNTER — Other Ambulatory Visit: Payer: BC Managed Care – PPO

## 2022-07-31 ENCOUNTER — Inpatient Hospital Stay (HOSPITAL_BASED_OUTPATIENT_CLINIC_OR_DEPARTMENT_OTHER): Payer: BC Managed Care – PPO | Admitting: Hematology

## 2022-07-31 ENCOUNTER — Inpatient Hospital Stay: Payer: BC Managed Care – PPO

## 2022-07-31 ENCOUNTER — Inpatient Hospital Stay: Payer: BC Managed Care – PPO | Admitting: Dietician

## 2022-07-31 ENCOUNTER — Other Ambulatory Visit: Payer: Self-pay | Admitting: Hematology

## 2022-07-31 VITALS — BP 139/95 | HR 75 | Temp 97.7°F | Resp 18 | Ht 67.0 in | Wt 195.3 lb

## 2022-07-31 VITALS — BP 156/89 | HR 91 | Temp 98.7°F | Resp 18

## 2022-07-31 DIAGNOSIS — R110 Nausea: Secondary | ICD-10-CM | POA: Diagnosis not present

## 2022-07-31 DIAGNOSIS — I700 Atherosclerosis of aorta: Secondary | ICD-10-CM | POA: Diagnosis not present

## 2022-07-31 DIAGNOSIS — C9000 Multiple myeloma not having achieved remission: Secondary | ICD-10-CM | POA: Diagnosis not present

## 2022-07-31 DIAGNOSIS — Z87891 Personal history of nicotine dependence: Secondary | ICD-10-CM | POA: Diagnosis not present

## 2022-07-31 DIAGNOSIS — R059 Cough, unspecified: Secondary | ICD-10-CM | POA: Diagnosis not present

## 2022-07-31 DIAGNOSIS — Z7189 Other specified counseling: Secondary | ICD-10-CM

## 2022-07-31 DIAGNOSIS — R0602 Shortness of breath: Secondary | ICD-10-CM | POA: Diagnosis not present

## 2022-07-31 DIAGNOSIS — Z79891 Long term (current) use of opiate analgesic: Secondary | ICD-10-CM | POA: Diagnosis not present

## 2022-07-31 DIAGNOSIS — Z7961 Long term (current) use of immunomodulator: Secondary | ICD-10-CM | POA: Diagnosis not present

## 2022-07-31 DIAGNOSIS — R5383 Other fatigue: Secondary | ICD-10-CM | POA: Diagnosis not present

## 2022-07-31 DIAGNOSIS — Z23 Encounter for immunization: Secondary | ICD-10-CM | POA: Diagnosis not present

## 2022-07-31 DIAGNOSIS — M545 Low back pain, unspecified: Secondary | ICD-10-CM | POA: Diagnosis not present

## 2022-07-31 DIAGNOSIS — Z5112 Encounter for antineoplastic immunotherapy: Secondary | ICD-10-CM | POA: Diagnosis not present

## 2022-07-31 DIAGNOSIS — D3501 Benign neoplasm of right adrenal gland: Secondary | ICD-10-CM | POA: Diagnosis not present

## 2022-07-31 LAB — CBC WITH DIFFERENTIAL (CANCER CENTER ONLY)
Abs Immature Granulocytes: 0.01 10*3/uL (ref 0.00–0.07)
Basophils Absolute: 0 10*3/uL (ref 0.0–0.1)
Basophils Relative: 1 %
Eosinophils Absolute: 0.1 10*3/uL (ref 0.0–0.5)
Eosinophils Relative: 3 %
HCT: 41.8 % (ref 39.0–52.0)
Hemoglobin: 14.1 g/dL (ref 13.0–17.0)
Immature Granulocytes: 0 %
Lymphocytes Relative: 26 %
Lymphs Abs: 0.9 10*3/uL (ref 0.7–4.0)
MCH: 28.9 pg (ref 26.0–34.0)
MCHC: 33.7 g/dL (ref 30.0–36.0)
MCV: 85.7 fL (ref 80.0–100.0)
Monocytes Absolute: 0.5 10*3/uL (ref 0.1–1.0)
Monocytes Relative: 14 %
Neutro Abs: 2 10*3/uL (ref 1.7–7.7)
Neutrophils Relative %: 56 %
Platelet Count: 178 10*3/uL (ref 150–400)
RBC: 4.88 MIL/uL (ref 4.22–5.81)
RDW: 14.2 % (ref 11.5–15.5)
WBC Count: 3.5 10*3/uL — ABNORMAL LOW (ref 4.0–10.5)
nRBC: 0 % (ref 0.0–0.2)

## 2022-07-31 LAB — CMP (CANCER CENTER ONLY)
ALT: 12 U/L (ref 0–44)
AST: 12 U/L — ABNORMAL LOW (ref 15–41)
Albumin: 4.2 g/dL (ref 3.5–5.0)
Alkaline Phosphatase: 48 U/L (ref 38–126)
Anion gap: 5 (ref 5–15)
BUN: 12 mg/dL (ref 8–23)
CO2: 26 mmol/L (ref 22–32)
Calcium: 9.6 mg/dL (ref 8.9–10.3)
Chloride: 107 mmol/L (ref 98–111)
Creatinine: 0.73 mg/dL (ref 0.61–1.24)
GFR, Estimated: >60 mL/min (ref >60)
Glucose, Bld: 100 mg/dL — ABNORMAL HIGH (ref 70–99)
Potassium: 4 mmol/L (ref 3.5–5.1)
Sodium: 138 mmol/L (ref 135–145)
Total Bilirubin: 0.7 mg/dL (ref 0.3–1.2)
Total Protein: 5.9 g/dL — ABNORMAL LOW (ref 6.5–8.1)

## 2022-07-31 MED ORDER — SODIUM CHLORIDE 0.9 % IV SOLN: Freq: Once | INTRAVENOUS | Status: AC

## 2022-07-31 MED ORDER — ACETAMINOPHEN 325 MG PO TABS
650.0000 mg | ORAL_TABLET | Freq: Once | ORAL | Status: AC
  Administered 2022-07-31: 650 mg via ORAL
  Filled 2022-07-31: qty 2

## 2022-07-31 MED ORDER — SODIUM CHLORIDE 0.9 % IV SOLN
20.0000 mg | Freq: Once | INTRAVENOUS | Status: AC
  Administered 2022-07-31: 20 mg via INTRAVENOUS
  Filled 2022-07-31: qty 20

## 2022-07-31 MED ORDER — DEXTROSE 5 % IV SOLN
70.0000 mg/m2 | Freq: Once | INTRAVENOUS | Status: AC
  Administered 2022-07-31: 140 mg via INTRAVENOUS
  Filled 2022-07-31: qty 60

## 2022-07-31 MED ORDER — DIPHENHYDRAMINE HCL 25 MG PO CAPS
50.0000 mg | ORAL_CAPSULE | Freq: Once | ORAL | Status: AC
  Administered 2022-07-31: 50 mg via ORAL
  Filled 2022-07-31: qty 2

## 2022-07-31 MED ORDER — FAMOTIDINE IN NACL 20-0.9 MG/50ML-% IV SOLN
20.0000 mg | Freq: Once | INTRAVENOUS | Status: AC
  Administered 2022-07-31: 20 mg via INTRAVENOUS
  Filled 2022-07-31: qty 50

## 2022-07-31 MED ORDER — PNEUMOCOCCAL 20-VAL CONJ VACC 0.5 ML IM SUSY
0.5000 mL | PREFILLED_SYRINGE | Freq: Once | INTRAMUSCULAR | Status: AC
  Administered 2022-07-31: 0.5 mL via INTRAMUSCULAR
  Filled 2022-07-31: qty 0.5

## 2022-07-31 MED ORDER — SODIUM CHLORIDE 0.9 % IV SOLN
16.0000 mg/kg | Freq: Once | INTRAVENOUS | Status: AC
  Administered 2022-07-31: 1400 mg via INTRAVENOUS
  Filled 2022-07-31: qty 60

## 2022-07-31 MED ORDER — ONDANSETRON HCL 8 MG PO TABS
8.0000 mg | ORAL_TABLET | Freq: Once | ORAL | Status: AC
  Administered 2022-07-31: 8 mg via ORAL
  Filled 2022-07-31: qty 1

## 2022-07-31 NOTE — Progress Notes (Signed)
Patient seen by MD today  Vitals are within treatment parameters.  Labs reviewed: and are within treatment parameters."  Per physician team, patient is ready for treatment and there are NO modifications to the treatment plan.  Pt to get Prevnar 20 today before leaving.

## 2022-07-31 NOTE — Progress Notes (Signed)
Patient requested that we run the Daratumumab the same rates as his last visit- 240m/hr. to 3076mhr. Instead of the bump up to 45063mr.- discussed with Dr. KalIrene Limboo stated that was acceptable. Will bump up to the lower rate per MD.

## 2022-07-31 NOTE — Patient Instructions (Signed)
[facility]  Discharge Instructions: Thank you for choosing [facility] to provide your oncology and hematology care.   If you have a lab appointment with the [facility], please go directly to the [facility] and check in at the registration area.   Wear comfortable clothing and clothing appropriate for easy access to any Portacath or PICC line.   We strive to give you quality time with your provider. You may need to reschedule your appointment if you arrive late (15 or more minutes).  Arriving late affects you and other patients whose appointments are after yours.  Also, if you miss three or more appointments without notifying the office, you may be dismissed from the clinic at the provider's discretion.      For prescription refill requests, have your pharmacy contact our office and allow 72 hours for refills to be completed.    Today you received the following chemotherapy and/or immunotherapy agents: carfilzomib and daratumumab with Prevnar 20   To help prevent nausea and vomiting after your treatment, we encourage you to take your nausea medication as directed.  BELOW ARE SYMPTOMS THAT SHOULD BE REPORTED IMMEDIATELY: *FEVER GREATER THAN 100.4 F (38 C) OR HIGHER *CHILLS OR SWEATING *NAUSEA AND VOMITING THAT IS NOT CONTROLLED WITH YOUR NAUSEA MEDICATION *UNUSUAL SHORTNESS OF BREATH *UNUSUAL BRUISING OR BLEEDING *URINARY PROBLEMS (pain or burning when urinating, or frequent urination) *BOWEL PROBLEMS (unusual diarrhea, constipation, pain near the anus) TENDERNESS IN MOUTH AND THROAT WITH OR WITHOUT PRESENCE OF ULCERS (sore throat, sores in mouth, or a toothache) UNUSUAL RASH, SWELLING OR PAIN  UNUSUAL VAGINAL DISCHARGE OR ITCHING   Items with * indicate a potential emergency and should be followed up as soon as possible or go to the Emergency Department if any problems should occur.  Please show the CHEMOTHERAPY ALERT CARD or  IMMUNOTHERAPY ALERT CARD at check-in to the Emergency Department and triage nurse.  Should you have questions after your visit or need to cancel or reschedule your appointment, please contact [facility]  Dept: [phone]  and follow the prompts.  Office hours are 8:00 a.m. to 4:30 p.m. Monday - Friday. Please note that voicemails left after 4:00 p.m. may not be returned until the following business day.  We are closed weekends and major holidays. You have access to a nurse at all times for urgent questions. Please call the main number to the clinic Dept: [phone] and follow the prompts.   For any non-urgent questions, you may also contact your provider using MyChart. We now offer e-Visits for anyone 56 and older to request care online for non-urgent symptoms. For details visit [email or web address].   Also download the MyChart app! Go to the app store, search "MyChart", open the app, select [facility], and log in with your MyChart username and password.  Masks are optional in the cancer centers. If you would like for your care team to wear a mask while they are taking care of you, please let them know. You may have one support person who is at least 62 years old accompany you for your appointments.

## 2022-07-31 NOTE — Progress Notes (Signed)
HEMATOLOGY/ONCOLOGY CLINIC NOTE  Date of Service: 07/31/22  Patient Care Team: Lennie Odor, [place] as PCP - General (Nurse Practitioner)  CHIEF COMPLAINTS/PURPOSE OF CONSULTATION:  Follow-up for continue evaluation and management of multiple myeloma  HISTORY OF PRESENTING ILLNESS:   Anthony [name] is a wonderful 61 y.o. male who has been referred to [place] by Dr Lennie Odor, PA for evaluation and management of newly diagnosed multiple myeloma. He reports He is doing well.  He reports persistent lower back pain that he has had since he was in his 20's. He notes no previous significant injuries. He further notes that he gets chiropractic adjustments and takes Tylenol to manage his symptoms. He notes less back pain when standing up on his right leg and maintaining weight on his right leg.  He reports previous history of smoking and chewing tobacco over 40 years ago.  He had a recent fall back in February of this year with only a pulled muscle. And he notes another fall when chasing his cat where he fell on his right shoulder. He reports pain in right shoulder.   He had a recent COVID-19 infection back in March.  He reports intermittent cough with SOB. He notes he takes 2 Rolaids at night. We discussed potentially trying antacids which he is agreeable to as he will begin steroids soon and was advised of the possible symptoms and side effects from taking steroids.  We discussed CRAB criteria and that he meets at least two of the criterion being  anemia and bone disease.  We discussed getting additional scans for further evaluation which he is agreeable to.  We further discussed starting Daratumumab/Velcade/Dexamethasone/Zometa and Revlimid for treatment which he is agreeable to. We also discussed starting steroids before treatment and taking an acid suppressant which he was also agreeable to.  We discussed getting Senna and taking it as needed for constipation.  Labs done today were  reviewed in detail.  We discussed his recent bone marrow biopsy and aspiration done 02/06/2022.  We discussed PET/CT scan done 01/28/2022.  We discussed CT right shoulder w/o contrast done 11/13/2021  INTERVAL HISTORY:  Anthony [name] is a 61 y.o. male, is here for continued evaluation of and management of multiple myeloma. He is here to start cycle 5 of his treatment.   Patient was last seen by me on 07/10/2022 and complained of discomfort, chills, shortness of breath, mild congestion, severe nausea, and mild body pain which started early morning on 07/10/2022. He also complained of fatigue/tiredness after changing his treatment duration. Patient did not receive his scheduled treatment during his last visit due to nausea. He reported that he was unsure about transplant.   Patient is here with his wife during this visit. He reports he is doing well without any new medical concerns since our last visit. He does complains of nausea due to his treatment. Patient is taking nausea medication and is helping him with his symptoms. He denies new infection issues, abdominal pain, abnormal bowl moment, or leg swelling.   He complains of occasional muscular pain. He takes oxycodone about 2-3 times a day for pain.  Patient notes he does not want to do stem cell transplant as of right now.   He reports he has not had alcohol intake in the past 6 months and had questions regarding his alcohol intake, which were answered.   He has received his influenza vaccine and COVID-19 Booster, but denies Pneumonia vaccine and RSV vaccine.   MEDICAL  HISTORY:  Past Medical History:  Diagnosis Date   Back pain    Cancer ([place])    Multiple myeloma ([place]) 02/15/2022    SURGICAL HISTORY: Past Surgical History:  Procedure Laterality Date   IR BONE TUMOR(S)RF ABLATION  05/07/2022   IR BONE TUMOR(S)RF ABLATION  05/07/2022   IR BONE TUMOR(S)RF ABLATION  05/07/2022   IR KYPHO EA ADDL LEVEL THORACIC OR LUMBAR   05/07/2022   IR KYPHO LUMBAR INC FX REDUCE BONE BX UNI/BIL CANNULATION INC/IMAGING  05/07/2022   IR KYPHO THORACIC WITH BONE BIOPSY  05/07/2022   IR RADIOLOGIST EVAL & MGMT  04/23/2022   IR RADIOLOGIST EVAL & MGMT  05/17/2022   IR RADIOLOGIST EVAL & MGMT  06/06/2022    SOCIAL HISTORY: Social History   Socioeconomic History   Marital status: Married    Spouse name: Not on file   Number of children: Not on file   Years of education: Not on file   Highest education level: Not on file  Occupational History   Not on file  Tobacco Use   Smoking status: Never   Smokeless tobacco: Former    Types: Chew, Snuff  Vaping Use   Vaping Use: Never used  Substance and Sexual Activity   Alcohol use: Yes    Alcohol/week: 2.0 standard drinks of alcohol    Types: 2 Glasses of wine per week   Drug use: No   Sexual activity: Yes  Other Topics Concern   Not on file  Social History Narrative   Not on file   Social Determinants of Health   Financial Resource Strain: Low Risk  (05/07/2022)   Overall Financial Resource Strain (CARDIA)    Difficulty of Paying Living Expenses: Not very hard  Food Insecurity: No Food Insecurity (05/07/2022)   Hunger Vital Sign    Worried About Running Out of Food in the Last Year: Never true    Ran Out of Food in the Last Year: Never true  Transportation Needs: No Transportation Needs (05/07/2022)   PRAPARE - Hydrologist (Medical): No    Lack of Transportation (Non-Medical): No  Physical Activity: Not on file  Stress: Not on file  Social Connections: Not on file  Intimate Partner Violence: Unknown (05/07/2022)   Humiliation, Afraid, Rape, and Kick questionnaire    Fear of Current or Ex-Partner: No    Emotionally Abused: No    Physically Abused: No    Sexually Abused: Not on file    FAMILY HISTORY: Family History  Problem Relation Age of Onset   Rheum arthritis Mother    Other Sister        Pre-cancerous uterine mass;    Rheum  arthritis Sister    Bone cancer Paternal Grandfather    Brain cancer Maternal Uncle    Brain cancer Maternal Aunt    Osteoporosis Neg Hx     ALLERGIES:  has No Known Allergies.  MEDICATIONS:  Current Outpatient Medications  Medication Sig Dispense Refill   aluminum chloride (DRYSOL) 20 % external solution Apply 1 application topically at bedtime.     apixaban (ELIQUIS) 5 MG TABS tablet Take 1 tablet (5 mg total) by mouth 2 (two) times daily. 60 tablet 5   calcium-vitamin D (OSCAL WITH D) 250-125 MG-UNIT tablet Take 1 tablet by mouth daily.     cyclobenzaprine (FLEXERIL) 10 MG tablet Take 10 mg by mouth 3 (three) times daily as needed for muscle spasms. (Patient not taking: Reported on 05/21/2022)  diclofenac sodium (VOLTAREN) 1 % GEL Apply 2 g topically as needed.     dronabinol (MARINOL) 5 MG capsule Take 1 capsule (5 mg total) by mouth 2 (two) times daily before a meal. 60 capsule 0   ergocalciferol (VITAMIN D2) 1.25 MG (50000 UT) capsule Take 1 capsule (50,000 Units total) by mouth 2 (two) times daily. 12 capsule 2   esomeprazole (NEXIUM) 40 MG capsule Take 1 capsule (40 mg total) by mouth daily before breakfast. 30 capsule 1   fentaNYL (DURAGESIC) 25 MCG/HR Place 1 patch onto the skin every 3 (three) days. 10 patch 0   furosemide (LASIX) 20 MG tablet Take 1 tablet (20 mg total) by mouth daily. 30 tablet 1   lidocaine (LIDODERM) 5 % Place 1 patch onto the skin daily. Remove & Discard patch within 12 hours or as directed by MD 30 patch 0   methocarbamol (ROBAXIN) 500 MG tablet SMARTSIG:3 Tablet(s) By Mouth Every 6 Hours PRN     methylPREDNISolone (MEDROL DOSEPAK) 4 MG TBPK tablet Take 6 pills by mouth day 1, 5 on day 2, 4 on day 3, 3 on day 4, 2 on day 5, 1 on day 6 21 tablet 0   Multiple Vitamin (MULTIVITAMIN) tablet Take 1 tablet by mouth daily.     Omega-3 Fatty Acids (FISH OIL) 1360 MG CAPS Take 1 capsule by mouth daily.     ondansetron (ZOFRAN) 8 MG tablet Take 1 tablet (8 mg  total) by mouth every 8 (eight) hours as needed for nausea or vomiting. 30 tablet 0   Oxycodone HCl 10 MG TABS Take 1/2 TO 1 tablet (5-10 mg total) by mouth every 4 (four) hours as needed for severe pain or moderate pain (cancer related pain). 30 tablet 0   REVLIMID 15 MG capsule TAKE 1 CAPSULE DAILY FOR 14 DAYS ON AND 7 DAYS OFF 14 capsule 0   REVLIMID 15 MG capsule TAKE 1 CAPSULE DAILY FOR 14 DAYS ON AND 7 DAYS OFF 14 capsule 0   senna-docusate (SENNA S) 8.6-50 MG tablet Take 2 tablets by mouth at bedtime. 60 tablet 1   terbinafine (LAMISIL) 1 % cream Apply 1 application topically 2 (two) times daily.     Current Facility-Administered Medications  Medication Dose Route Frequency Provider Last Rate Last Admin   methocarbamol (ROBAXIN) tablet 500 mg  500 mg Oral Q6H PRN Criselda Peaches, MD       10 Point review of Systems was done is negative except as noted above.  PHYSICAL EXAMINATION: .BP (!) 139/95 (BP Location: Left Arm, Patient Position: Sitting)   Pulse 75   Temp 97.7 F (36.5 C) (Tympanic)   Resp 18   Ht _0  (1.702 m)   Wt 195 lb 4.8 oz (88.6 kg)   SpO2 100%   BMI 30.59 kg/m   NAD GENERAL:alert, in no acute distress and comfortable SKIN: no acute rashes, no significant lesions EYES: conjunctiva are pink and non-injected, sclera anicteric OROPHARYNX: MMM, no exudates, no oropharyngeal erythema or ulceration NECK: supple, no JVD LYMPH:  no palpable lymphadenopathy in the cervical, axillary or inguinal regions LUNGS: clear to auscultation b/l with normal respiratory effort HEART: regular rate & rhythm ABDOMEN:  normoactive bowel sounds , non tender, not distended. Extremity: no pedal edema PSYCH: alert & oriented x 3 with fluent speech NEURO: no focal motor/sensory deficits   LABORATORY DATA:  I have reviewed the data as listed .    Latest Ref Rng & Units 07/31/2022  9:45 AM 07/30/2022    8:30 AM 07/17/2022    9:42 AM  CBC  WBC 4.0 - 10.5 K/uL 3.5  3.7   4.1   Hemoglobin 13.0 - 17.0 g/dL 14.1  14.6  14.6   Hematocrit 39.0 - 52.0 % 41.8  47.6  43.9   Platelets 150 - 400 K/uL 178  179  189       Latest Ref Rng & Units 07/31/2022    9:45 AM 07/17/2022    9:42 AM 07/10/2022   11:30 AM  CMP  Glucose 70 - 99 mg/dL 100  85  110   BUN 8 - 23 mg/dL _0 Creatinine 0.61 - 1.24 mg/dL 0.73  0.82  0.71   Sodium 135 - 145 mmol/L 138  137  136   Potassium 3.5 - 5.1 mmol/L 4.0  3.8  4.4   Chloride 98 - 111 mmol/L 107  105  109   CO2 22 - 32 mmol/L _1 Calcium 8.9 - 10.3 mg/dL 9.6  9.2  8.9   Total Protein 6.5 - 8.1 g/dL 5.9  6.7  7.0   Total Bilirubin 0.3 - 1.2 mg/dL 0.7  0.9  1.0   Alkaline Phos 38 - 126 U/L 48  56  46   AST 15 - 41 U/L _2 ALT 0 - 44 U/L _3 02/06/2022 Molecular pathology   RADIOGRAPHIC STUDIES: I have personally reviewed the radiological images as listed and agreed with the findings in the report. CT BONE MARROW BIOPSY & ASPIRATION  Result Date: 07/30/2022 INDICATION: Multiple myeloma EXAM: CT GUIDED BONE MARROW ASPIRATES AND BIOPSY MEDICATIONS: 2 mg IV Versed ANESTHESIA/SEDATION: None COMPLICATIONS: None immediate. PROCEDURE: The procedure was explained to the patient. The risks and benefits of the procedure were discussed and the patient's questions were addressed. Informed consent was obtained from the patient. The patient was placed prone on CT table. Images of the pelvis were obtained. The right side of back was prepped and draped in sterile fashion. The skin and right posterior ilium were anesthetized with 10 mL of 1% lidocaine. 11 gauge bone needle was directed into the right ilium with CT guidance. Two aspirates and 1 core biopsy were obtained. Samples were adequate per pathology technologist. Additional 5 mL of 0.5% Sensorcaine was administered at the biopsy site. Bandage placed over the puncture site. IMPRESSION: CT guided bone marrow aspiration and core biopsy. Electronically  Signed   By: Miachel Roux M.D.   On: 07/30/2022 10:24   NM PET Image Restage (PS) Whole Body  Result Date: 07/29/2022 CLINICAL DATA:  Subsequent treatment strategy for multiple myeloma on chemotherapy. EXAM: NUCLEAR MEDICINE PET WHOLE BODY TECHNIQUE: 9.8 mCi F-18 FDG was injected intravenously. Full-ring PET imaging was performed from the head to foot after the radiotracer. CT data was obtained and used for attenuation correction and anatomic localization. Fasting blood glucose: 87 mg/dl COMPARISON:  01/28/2022 PET and the CT of the abdomen and pelvis 02/23/2022. FINDINGS: Mediastinal blood pool activity: SUV max 2.5 HEAD/NECK: No areas of abnormal hypermetabolism. Incidental CT findings: No cervical adenopathy. CHEST: No pulmonary parenchymal or thoracic nodal hypermetabolism. Incidental CT findings: Mild cardiomegaly. Calcified right hilar and mediastinal nodes are likely related to old granulomatous disease. Increase in moderate hiatal hernia. Right lower lobe calcified granuloma. ABDOMEN/PELVIS: No abdominopelvic parenchymal or nodal hypermetabolism. Incidental CT findings: Right adrenal 2.3  cm nodule measures 0 HU, consistent with an adenoma . In the absence of clinically indicated signs/symptoms require(s) no independent follow-up. abdominal aortic atherosclerosis. No abdominopelvic adenopathy. Old granulomatous disease in the spleen. Scattered colonic diverticula. Moderate prostatomegaly. SKELETON: The lateral right fifth rib lesion measures a S.U.V. max of 3.5 today versus a S.U.V. max of 4.7 on the prior. Hypermetabolism just posterior to this within the sixth right rib is secondary to a healing fracture. Pelvic hypermetabolism has in general improved. For example, right sacral hypermetabolism at a S.U.V. max of 2.4 today versus a S.U.V. max of 5.4 on the prior exam. Incidental CT findings: Diffuse mottled appearance of the axial and appendicular spine again identified. Vertebral augmentation at T11,  T12, L2. EXTREMITIES: The right humeral head index lesion is more well-defined today. 2.9 cm and a S.U.V. max of 2.1 on 77/4 versus 3.2 cm and a S.U.V. max of 4.4 on the prior. Incidental CT findings: Mottled appearance involves the proximal femurs bilaterally. Right supraspinatus intramuscular lipoma of 6.6 cm. IMPRESSION: 1. Response to therapy of osseous myeloma. 2. No extraosseous myeloma identified. 3. Increase in moderate hiatal hernia. 4. Incidental findings, including: Right adrenal adenoma. Aortic Atherosclerosis (ICD10-I70.0). Prostatomegaly. Electronically Signed   By: Abigail Miyamoto M.D.   On: 07/29/2022 13:49     ASSESSMENT & PLAN:   61 y.o. very pleasant male with  1. R-ISS stage III multiple myeloma with extensive bone metastases and patholgiic fracture rt shoulder -Recent bone marrow biopsy and aspiration done 02/06/2022 revealed hypercellular bone marrow with plasma cell neoplasm. 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  Plan: -Discussed lab results from today with the patient. CBC showed slightly low WBC of 3.5 K, everything else stable. CMP stable -Discussed the PET scan results with the patient and his wife. PET scan showed  1. Response to therapy of osseous myeloma. 2. No extraosseous myeloma identified. 3. Increase in moderate hiatal hernia. 4. Incidental findings, including: Right adrenal adenoma. Aortic Atherosclerosis. -Patient had his bone marrow biopsy yesterday, 07/30/2022, and pending results--received later --no evidence of residual plasma cell neoplasm and <1% plasma cells  -Patient does not want to go with HDT Auto HSCT transplant as of right now.  -Daratumumab once a month and Carfilzomib every 2 weeks.  -Recommended RSV vaccine and Pneumonia vaccine. -Answered all of patient's and his wife's questions regarding his treatment and  multiple myeloma. -Answered patient's question about alcohol intake.  -We shall  continue his daratumumab/carfilzomib/Revlimid/dexamethasone regimen  FOLLOW-UP: Per integrated scheduling   The total time spent in the appointment was 32 minutes* .  All of the patient's questions were answered with apparent satisfaction. The patient knows to call the clinic with any problems, questions or concerns.   Sullivan Lone MD MS AAHIVMS [facility] Hematology/Oncology Physician [facility]  .*Total Encounter Time as defined by the Centers for Medicare and Medicaid Services includes, in addition to the face-to-face time of a patient visit (documented in the note above) non-face-to-face time: obtaining and reviewing outside history, ordering and reviewing medications, tests or procedures, care coordination (communications with other health care professionals or caregivers) and documentation in the medical record.   I, Cleda Mccreedy, am acting as a Education administrator for Sullivan Lone, MD. .I have reviewed the above documentation for accuracy and completeness, and I agree with the above. Brunetta Genera MD

## 2022-07-31 NOTE — Progress Notes (Signed)
Nutrition Follow-up:  Patient with multiple myeloma. He is receiving carfilzomib, daratumumab, revlimid + dexamethasone.   RD working remotely.  Spoke with patient via telephone. He reports increased nausea in the last few weeks. Patient is taking zofran which is working well for him. Patient has noticed decreased appetite in the last few days due to this. He is eating 3 small meals plus snacks. He is drinking one orgain protein shake. These are good, however expensive and he does not plan on purchasing anymore. Patient is staying hydrated, reports a half gallon on good days. Patient reports he has decided not to move forward with bone marrow transplant. He will begin maintenance therapy after completing cycle 5.   Medications: fentanyl, zofran, oxycodone  Labs: reviewed   Anthropometrics: Wt 195 lb 4.8 oz today stable  11/1 - 194 lb 1.6 oz 10/24 - 191 lb 8 oz 10/3 - 193 lb  9/13 - 190 lb 12.8 oz   NUTRITION DIAGNOSIS: Unintentional weight loss stable    INTERVENTION:  Continue 3 small meals plus high protein snacks for weight maintenance Continue drinking one protein shake daily  Will attempt to obtain [employer] coupons and will share these with patient as able  Discussed tips for nausea, foods best tolerated and foods to limit/avoid    MONITORING, EVALUATION, GOAL: weight trends, intake    NEXT VISIT: Wednesday January 10 during infusion

## 2022-08-01 ENCOUNTER — Other Ambulatory Visit: Payer: Self-pay

## 2022-08-01 DIAGNOSIS — C9000 Multiple myeloma not having achieved remission: Secondary | ICD-10-CM

## 2022-08-01 LAB — SURGICAL PATHOLOGY

## 2022-08-01 MED ORDER — OXYCODONE HCL 10 MG PO TABS: ORAL_TABLET | ORAL | 0 refills | Status: DC

## 2022-08-01 MED ORDER — OXYCODONE HCL 5 MG PO TABS: 5.0000 mg | ORAL_TABLET | ORAL | 0 refills | Status: DC | PRN

## 2022-08-02 ENCOUNTER — Other Ambulatory Visit: Payer: Self-pay

## 2022-08-04 ENCOUNTER — Other Ambulatory Visit: Payer: Self-pay

## 2022-08-05 ENCOUNTER — Encounter: Payer: Self-pay | Admitting: Hematology

## 2022-08-06 LAB — MULTIPLE MYELOMA PANEL, SERUM
Albumin SerPl Elph-Mcnc: 3.7 g/dL (ref 2.9–4.4)
Albumin/Glob SerPl: 1.9 — ABNORMAL HIGH (ref 0.7–1.7)
Alpha 1: 0.3 g/dL (ref 0.0–0.4)
Alpha2 Glob SerPl Elph-Mcnc: 0.5 g/dL (ref 0.4–1.0)
B-Globulin SerPl Elph-Mcnc: 0.8 g/dL (ref 0.7–1.3)
Gamma Glob SerPl Elph-Mcnc: 0.5 g/dL (ref 0.4–1.8)
Globulin, Total: 2 g/dL — ABNORMAL LOW (ref 2.2–3.9)
IgA: 25 mg/dL — ABNORMAL LOW (ref 61–437)
IgG (Immunoglobin G), Serum: 451 mg/dL — ABNORMAL LOW (ref 603–1613)
IgM (Immunoglobulin M), Srm: 14 mg/dL — ABNORMAL LOW (ref 20–172)
M Protein SerPl Elph-Mcnc: 0.1 g/dL — ABNORMAL HIGH
Total Protein ELP: 5.7 g/dL — ABNORMAL LOW (ref 6.0–8.5)

## 2022-08-06 MED FILL — Dexamethasone Sodium Phosphate Inj 100 MG/10ML: INTRAMUSCULAR | Qty: 2 | Status: AC

## 2022-08-07 ENCOUNTER — Other Ambulatory Visit: Payer: Self-pay

## 2022-08-07 ENCOUNTER — Inpatient Hospital Stay: Payer: BC Managed Care – PPO

## 2022-08-07 VITALS — BP 122/84 | HR 92 | Temp 98.7°F | Resp 18 | Wt 194.8 lb

## 2022-08-07 DIAGNOSIS — Z87891 Personal history of nicotine dependence: Secondary | ICD-10-CM | POA: Diagnosis not present

## 2022-08-07 DIAGNOSIS — Z79891 Long term (current) use of opiate analgesic: Secondary | ICD-10-CM | POA: Diagnosis not present

## 2022-08-07 DIAGNOSIS — C9000 Multiple myeloma not having achieved remission: Secondary | ICD-10-CM

## 2022-08-07 DIAGNOSIS — Z7189 Other specified counseling: Secondary | ICD-10-CM

## 2022-08-07 DIAGNOSIS — Z23 Encounter for immunization: Secondary | ICD-10-CM | POA: Diagnosis not present

## 2022-08-07 DIAGNOSIS — M545 Low back pain, unspecified: Secondary | ICD-10-CM | POA: Diagnosis not present

## 2022-08-07 DIAGNOSIS — R110 Nausea: Secondary | ICD-10-CM | POA: Diagnosis not present

## 2022-08-07 DIAGNOSIS — D3501 Benign neoplasm of right adrenal gland: Secondary | ICD-10-CM | POA: Diagnosis not present

## 2022-08-07 DIAGNOSIS — Z5112 Encounter for antineoplastic immunotherapy: Secondary | ICD-10-CM | POA: Diagnosis not present

## 2022-08-07 DIAGNOSIS — R059 Cough, unspecified: Secondary | ICD-10-CM | POA: Diagnosis not present

## 2022-08-07 DIAGNOSIS — Z7961 Long term (current) use of immunomodulator: Secondary | ICD-10-CM | POA: Diagnosis not present

## 2022-08-07 DIAGNOSIS — I700 Atherosclerosis of aorta: Secondary | ICD-10-CM | POA: Diagnosis not present

## 2022-08-07 DIAGNOSIS — R5383 Other fatigue: Secondary | ICD-10-CM | POA: Diagnosis not present

## 2022-08-07 DIAGNOSIS — R0602 Shortness of breath: Secondary | ICD-10-CM | POA: Diagnosis not present

## 2022-08-07 LAB — CMP (CANCER CENTER ONLY)
ALT: 10 U/L (ref 0–44)
AST: 12 U/L — ABNORMAL LOW (ref 15–41)
Albumin: 4.2 g/dL (ref 3.5–5.0)
Alkaline Phosphatase: 59 U/L (ref 38–126)
Anion gap: 7 (ref 5–15)
BUN: 16 mg/dL (ref 8–23)
CO2: 25 mmol/L (ref 22–32)
Calcium: 9.7 mg/dL (ref 8.9–10.3)
Chloride: 103 mmol/L (ref 98–111)
Creatinine: 0.87 mg/dL (ref 0.61–1.24)
GFR, Estimated: >60 mL/min (ref >60)
Glucose, Bld: 146 mg/dL — ABNORMAL HIGH (ref 70–99)
Potassium: 3.7 mmol/L (ref 3.5–5.1)
Sodium: 135 mmol/L (ref 135–145)
Total Bilirubin: 0.7 mg/dL (ref 0.3–1.2)
Total Protein: 6.3 g/dL — ABNORMAL LOW (ref 6.5–8.1)

## 2022-08-07 LAB — CBC WITH DIFFERENTIAL (CANCER CENTER ONLY)
Abs Immature Granulocytes: 0 10*3/uL (ref 0.00–0.07)
Basophils Absolute: 0 10*3/uL (ref 0.0–0.1)
Basophils Relative: 1 %
Eosinophils Absolute: 0.3 10*3/uL (ref 0.0–0.5)
Eosinophils Relative: 9 %
HCT: 43.1 % (ref 39.0–52.0)
Hemoglobin: 14.9 g/dL (ref 13.0–17.0)
Immature Granulocytes: 0 %
Lymphocytes Relative: 20 %
Lymphs Abs: 0.7 10*3/uL (ref 0.7–4.0)
MCH: 29.3 pg (ref 26.0–34.0)
MCHC: 34.6 g/dL (ref 30.0–36.0)
MCV: 84.8 fL (ref 80.0–100.0)
Monocytes Absolute: 0.4 10*3/uL (ref 0.1–1.0)
Monocytes Relative: 11 %
Neutro Abs: 2 10*3/uL (ref 1.7–7.7)
Neutrophils Relative %: 59 %
Platelet Count: 99 10*3/uL — ABNORMAL LOW (ref 150–400)
RBC: 5.08 MIL/uL (ref 4.22–5.81)
RDW: 14.2 % (ref 11.5–15.5)
WBC Count: 3.3 10*3/uL — ABNORMAL LOW (ref 4.0–10.5)
nRBC: 0 % (ref 0.0–0.2)

## 2022-08-07 MED ORDER — SODIUM CHLORIDE 0.9 % IV SOLN: Freq: Once | INTRAVENOUS | Status: AC

## 2022-08-07 MED ORDER — DEXTROSE 5 % IV SOLN
70.0000 mg/m2 | Freq: Once | INTRAVENOUS | Status: AC
  Administered 2022-08-07: 140 mg via INTRAVENOUS
  Filled 2022-08-07: qty 60

## 2022-08-07 MED ORDER — SODIUM CHLORIDE 0.9 % IV SOLN
20.0000 mg | Freq: Once | INTRAVENOUS | Status: AC
  Administered 2022-08-07: 20 mg via INTRAVENOUS
  Filled 2022-08-07: qty 20

## 2022-08-07 NOTE — Patient Instructions (Signed)
[facility] MEDICAL ONCOLOGY  Discharge Instructions: Thank you for choosing [facility] to provide your oncology and hematology care.   If you have a lab appointment with the Cancer Center, please go directly to the Cancer Center and check in at the registration area.   Wear comfortable clothing and clothing appropriate for easy access to any Portacath or PICC line.   We strive to give you quality time with your provider. You may need to reschedule your appointment if you arrive late (15 or more minutes).  Arriving late affects you and other patients whose appointments are after yours.  Also, if you miss three or more appointments without notifying the office, you may be dismissed from the clinic at the provider's discretion.      For prescription refill requests, have your pharmacy contact our office and allow 72 hours for refills to be completed.    Today you received the following chemotherapy and/or immunotherapy agents: Kyprolis.       To help prevent nausea and vomiting after your treatment, we encourage you to take your nausea medication as directed.  BELOW ARE SYMPTOMS THAT SHOULD BE REPORTED IMMEDIATELY: *FEVER GREATER THAN 100.4 F (38 C) OR HIGHER *CHILLS OR SWEATING *NAUSEA AND VOMITING THAT IS NOT CONTROLLED WITH YOUR NAUSEA MEDICATION *UNUSUAL SHORTNESS OF BREATH *UNUSUAL BRUISING OR BLEEDING *URINARY PROBLEMS (pain or burning when urinating, or frequent urination) *BOWEL PROBLEMS (unusual diarrhea, constipation, pain near the anus) TENDERNESS IN MOUTH AND THROAT WITH OR WITHOUT PRESENCE OF ULCERS (sore throat, sores in mouth, or a toothache) UNUSUAL RASH, SWELLING OR PAIN  UNUSUAL VAGINAL DISCHARGE OR ITCHING   Items with * indicate a potential emergency and should be followed up as soon as possible or go to the Emergency Department if any problems should occur.  Please show the CHEMOTHERAPY ALERT CARD or IMMUNOTHERAPY ALERT CARD at check-in to  the Emergency Department and triage nurse.  Should you have questions after your visit or need to cancel or reschedule your appointment, please contact [facility] MEDICAL ONCOLOG[facility]  Dept: [phone]  and follow the prompts.  Office hours are 8:00 a.m. to 4:30 p.m. Monday - Friday. Please note that voicemails left after 4:00 p.m. may not be returned until the following business day.  We are closed weekends and major holidays. You have access to a nurse at all times for urgent questions. Please call the main number to the clinic Dept: [phone] and follow the prompts.   For any non-urgent questions, you may also contact your provider using MyChart. We now offer e-Visits for anyone 18 and older to request care online for non-urgent symptoms. For details visit [email or web address].   Also download the MyChart app! Go to the app store, search "MyChart", open the app, select [facility], and log in with your MyChart username and password.  Masks are optional in the cancer centers. If you would like for your care team to wear a mask while they are taking care of you, please let them know. You may have one support person who is at least 62 years old accompany you for your appointments. 

## 2022-08-07 NOTE — Progress Notes (Signed)
Per Dr Lorenso Courier ok to treat with platelets of 99 today.

## 2022-08-08 ENCOUNTER — Other Ambulatory Visit: Payer: Self-pay

## 2022-08-09 ENCOUNTER — Encounter (HOSPITAL_COMMUNITY): Payer: Self-pay | Admitting: Hematology

## 2022-08-13 MED FILL — Dexamethasone Sodium Phosphate Inj 100 MG/10ML: INTRAMUSCULAR | Qty: 2 | Status: AC

## 2022-08-14 ENCOUNTER — Inpatient Hospital Stay: Payer: BC Managed Care – PPO

## 2022-08-14 ENCOUNTER — Inpatient Hospital Stay (HOSPITAL_BASED_OUTPATIENT_CLINIC_OR_DEPARTMENT_OTHER): Payer: BC Managed Care – PPO | Admitting: Hematology

## 2022-08-14 VITALS — BP 132/78 | HR 89 | Temp 98.1°F | Resp 18 | Ht 67.0 in | Wt 194.9 lb

## 2022-08-14 VITALS — BP 136/74 | HR 100 | Resp 16

## 2022-08-14 DIAGNOSIS — C9000 Multiple myeloma not having achieved remission: Secondary | ICD-10-CM

## 2022-08-14 DIAGNOSIS — I700 Atherosclerosis of aorta: Secondary | ICD-10-CM | POA: Diagnosis not present

## 2022-08-14 DIAGNOSIS — Z7189 Other specified counseling: Secondary | ICD-10-CM

## 2022-08-14 DIAGNOSIS — R059 Cough, unspecified: Secondary | ICD-10-CM | POA: Diagnosis not present

## 2022-08-14 DIAGNOSIS — Z7961 Long term (current) use of immunomodulator: Secondary | ICD-10-CM | POA: Diagnosis not present

## 2022-08-14 DIAGNOSIS — Z23 Encounter for immunization: Secondary | ICD-10-CM | POA: Diagnosis not present

## 2022-08-14 DIAGNOSIS — R0602 Shortness of breath: Secondary | ICD-10-CM | POA: Diagnosis not present

## 2022-08-14 DIAGNOSIS — Z87891 Personal history of nicotine dependence: Secondary | ICD-10-CM | POA: Diagnosis not present

## 2022-08-14 DIAGNOSIS — M545 Low back pain, unspecified: Secondary | ICD-10-CM | POA: Diagnosis not present

## 2022-08-14 DIAGNOSIS — R5383 Other fatigue: Secondary | ICD-10-CM | POA: Diagnosis not present

## 2022-08-14 DIAGNOSIS — R110 Nausea: Secondary | ICD-10-CM | POA: Diagnosis not present

## 2022-08-14 DIAGNOSIS — G893 Neoplasm related pain (acute) (chronic): Secondary | ICD-10-CM

## 2022-08-14 DIAGNOSIS — D3501 Benign neoplasm of right adrenal gland: Secondary | ICD-10-CM | POA: Diagnosis not present

## 2022-08-14 DIAGNOSIS — Z5112 Encounter for antineoplastic immunotherapy: Secondary | ICD-10-CM | POA: Diagnosis not present

## 2022-08-14 DIAGNOSIS — Z79891 Long term (current) use of opiate analgesic: Secondary | ICD-10-CM | POA: Diagnosis not present

## 2022-08-14 LAB — CBC WITH DIFFERENTIAL (CANCER CENTER ONLY)
Abs Immature Granulocytes: 0.02 K/uL (ref 0.00–0.07)
Basophils Absolute: 0 K/uL (ref 0.0–0.1)
Basophils Relative: 1 %
Eosinophils Absolute: 0.3 K/uL (ref 0.0–0.5)
Eosinophils Relative: 5 %
HCT: 43.4 % (ref 39.0–52.0)
Hemoglobin: 15 g/dL (ref 13.0–17.0)
Immature Granulocytes: 0 %
Lymphocytes Relative: 21 %
Lymphs Abs: 1.3 K/uL (ref 0.7–4.0)
MCH: 29.4 pg (ref 26.0–34.0)
MCHC: 34.6 g/dL (ref 30.0–36.0)
MCV: 85.1 fL (ref 80.0–100.0)
Monocytes Absolute: 1.1 K/uL — ABNORMAL HIGH (ref 0.1–1.0)
Monocytes Relative: 18 %
Neutro Abs: 3.5 K/uL (ref 1.7–7.7)
Neutrophils Relative %: 55 %
Platelet Count: 150 K/uL (ref 150–400)
RBC: 5.1 MIL/uL (ref 4.22–5.81)
RDW: 14 % (ref 11.5–15.5)
WBC Count: 6.2 K/uL (ref 4.0–10.5)
nRBC: 0 % (ref 0.0–0.2)

## 2022-08-14 LAB — CMP (CANCER CENTER ONLY)
ALT: 17 U/L (ref 0–44)
AST: 15 U/L (ref 15–41)
Albumin: 4.2 g/dL (ref 3.5–5.0)
Alkaline Phosphatase: 51 U/L (ref 38–126)
Anion gap: 7 (ref 5–15)
BUN: 14 mg/dL (ref 8–23)
CO2: 24 mmol/L (ref 22–32)
Calcium: 9 mg/dL (ref 8.9–10.3)
Chloride: 105 mmol/L (ref 98–111)
Creatinine: 0.92 mg/dL (ref 0.61–1.24)
GFR, Estimated: >60 mL/min (ref >60)
Glucose, Bld: 108 mg/dL — ABNORMAL HIGH (ref 70–99)
Potassium: 4 mmol/L (ref 3.5–5.1)
Sodium: 136 mmol/L (ref 135–145)
Total Bilirubin: 0.9 mg/dL (ref 0.3–1.2)
Total Protein: 6.2 g/dL — ABNORMAL LOW (ref 6.5–8.1)

## 2022-08-14 MED ORDER — SODIUM CHLORIDE 0.9 % IV SOLN: Freq: Once | INTRAVENOUS | Status: AC

## 2022-08-14 MED ORDER — OXYCODONE HCL 5 MG PO TABS: 5.0000 mg | ORAL_TABLET | ORAL | 0 refills | Status: DC | PRN

## 2022-08-14 MED ORDER — ERGOCALCIFEROL 1.25 MG (50000 UT) PO CAPS: 50000.0000 [IU] | ORAL_CAPSULE | Freq: Two times a day (BID) | ORAL | 2 refills | Status: DC

## 2022-08-14 MED ORDER — ACYCLOVIR 400 MG PO TABS: 400.0000 mg | ORAL_TABLET | Freq: Two times a day (BID) | ORAL | 11 refills | Status: DC

## 2022-08-14 MED ORDER — REVLIMID 15 MG PO CAPS: ORAL_CAPSULE | ORAL | 3 refills | Status: DC

## 2022-08-14 MED ORDER — SODIUM CHLORIDE 0.9 % IV SOLN
20.0000 mg | Freq: Once | INTRAVENOUS | Status: AC
  Administered 2022-08-14: 20 mg via INTRAVENOUS
  Filled 2022-08-14: qty 20

## 2022-08-14 MED ORDER — FAMOTIDINE IN NACL 20-0.9 MG/50ML-% IV SOLN
20.0000 mg | Freq: Once | INTRAVENOUS | Status: AC
  Administered 2022-08-14: 20 mg via INTRAVENOUS
  Filled 2022-08-14: qty 50

## 2022-08-14 MED ORDER — ACETAMINOPHEN 325 MG PO TABS
650.0000 mg | ORAL_TABLET | Freq: Once | ORAL | Status: AC
  Administered 2022-08-14: 650 mg via ORAL
  Filled 2022-08-14: qty 2

## 2022-08-14 MED ORDER — DEXTROSE 5 % IV SOLN
70.0000 mg/m2 | Freq: Once | INTRAVENOUS | Status: AC
  Administered 2022-08-14: 140 mg via INTRAVENOUS
  Filled 2022-08-14: qty 60

## 2022-08-14 MED ORDER — SODIUM CHLORIDE 0.9 % IV SOLN
16.0000 mg/kg | Freq: Once | INTRAVENOUS | Status: AC
  Administered 2022-08-14: 1400 mg via INTRAVENOUS
  Filled 2022-08-14: qty 60

## 2022-08-14 MED ORDER — METHOCARBAMOL 500 MG PO TABS: 500.0000 mg | ORAL_TABLET | Freq: Three times a day (TID) | ORAL | 0 refills | Status: DC | PRN

## 2022-08-14 MED ORDER — DIPHENHYDRAMINE HCL 25 MG PO CAPS
50.0000 mg | ORAL_CAPSULE | Freq: Once | ORAL | Status: AC
  Administered 2022-08-14: 50 mg via ORAL
  Filled 2022-08-14: qty 2

## 2022-08-14 NOTE — Patient Instructions (Signed)
[facility]   Discharge Instructions: Thank you for choosing [facility] to provide your oncology and hematology care.   If you have a lab appointment with the [facility], please go directly to the [facility] and check in at the registration area.   Wear comfortable clothing and clothing appropriate for easy access to any Portacath or PICC line.   We strive to give you quality time with your provider. You may need to reschedule your appointment if you arrive late (15 or more minutes).  Arriving late affects you and other patients whose appointments are after yours.  Also, if you miss three or more appointments without notifying the office, you may be dismissed from the clinic at the provider's discretion.      For prescription refill requests, have your pharmacy contact our office and allow 72 hours for refills to be completed.    Today you received the following chemotherapy and/or immunotherapy agents: Carfilzomib (Kyprolis) and Daratumumab (Darzalex)       To help prevent nausea and vomiting after your treatment, we encourage you to take your nausea medication as directed.  BELOW ARE SYMPTOMS THAT SHOULD BE REPORTED IMMEDIATELY: *FEVER GREATER THAN 100.4 F (38 C) OR HIGHER *CHILLS OR SWEATING *NAUSEA AND VOMITING THAT IS NOT CONTROLLED WITH YOUR NAUSEA MEDICATION *UNUSUAL SHORTNESS OF BREATH *UNUSUAL BRUISING OR BLEEDING *URINARY PROBLEMS (pain or burning when urinating, or frequent urination) *BOWEL PROBLEMS (unusual diarrhea, constipation, pain near the anus) TENDERNESS IN MOUTH AND THROAT WITH OR WITHOUT PRESENCE OF ULCERS (sore throat, sores in mouth, or a toothache) UNUSUAL RASH, SWELLING OR PAIN  UNUSUAL VAGINAL DISCHARGE OR ITCHING   Items with * indicate a potential emergency and should be followed up as soon as possible or go to the Emergency Department if any problems should occur.  Please show the CHEMOTHERAPY ALERT CARD  or IMMUNOTHERAPY ALERT CARD at check-in to the Emergency Department and triage nurse.  Should you have questions after your visit or need to cancel or reschedule your appointment, please contact [facility]  Dept: [phone]  and follow the prompts.  Office hours are 8:00 a.m. to 4:30 p.m. Monday - Friday. Please note that voicemails left after 4:00 p.m. may not be returned until the following business day.  We are closed weekends and major holidays. You have access to a nurse at all times for urgent questions. Please call the main number to the clinic Dept: [phone] and follow the prompts.   For any non-urgent questions, you may also contact your provider using MyChart. We now offer e-Visits for anyone 83 and older to request care online for non-urgent symptoms. For details visit [email or web address].   Also download the MyChart app! Go to the app store, search "MyChart", open the app, select [facility], and log in with your MyChart username and password.

## 2022-08-14 NOTE — Progress Notes (Signed)
Patient requesting to extend infusion time d/t increasing nausea with kyprolis and darzalex treatments. Okay to run dara as a subsequent infusion today. Confirmed with pharmacy, MD, and charge RN.

## 2022-08-19 ENCOUNTER — Other Ambulatory Visit: Payer: Self-pay | Admitting: Hematology

## 2022-08-19 DIAGNOSIS — C9000 Multiple myeloma not having achieved remission: Secondary | ICD-10-CM

## 2022-08-20 ENCOUNTER — Other Ambulatory Visit: Payer: Self-pay

## 2022-08-20 ENCOUNTER — Encounter: Payer: Self-pay | Admitting: Hematology

## 2022-08-20 DIAGNOSIS — C9000 Multiple myeloma not having achieved remission: Secondary | ICD-10-CM

## 2022-08-20 MED ORDER — REVLIMID 15 MG PO CAPS: ORAL_CAPSULE | ORAL | 0 refills | Status: DC

## 2022-08-20 NOTE — Progress Notes (Signed)
HEMATOLOGY/ONCOLOGY CLINIC NOTE  Date of Service: 08/14/2022   Patient Care Team: Lennie Odor, [place] as PCP - General (Nurse Practitioner)  CHIEF COMPLAINTS/PURPOSE OF CONSULTATION:  Follow-up for continued evaluation and management of multiple myeloma  HISTORY OF PRESENTING ILLNESS:   Anthony [name] is a wonderful 62 y.o. male who has been referred to [place] by Dr Lennie Odor, PA for evaluation and management of newly diagnosed multiple myeloma. He reports He is doing well.  He reports persistent lower back pain that he has had since he was in his 20's. He notes no previous significant injuries. He further notes that he gets chiropractic adjustments and takes Tylenol to manage his symptoms. He notes less back pain when standing up on his right leg and maintaining weight on his right leg.  He reports previous history of smoking and chewing tobacco over 40 years ago.  He had a recent fall back in February of this year with only a pulled muscle. And he notes another fall when chasing his cat where he fell on his right shoulder. He reports pain in right shoulder.   He had a recent COVID-19 infection back in March.  He reports intermittent cough with SOB. He notes he takes 2 Rolaids at night. We discussed potentially trying antacids which he is agreeable to as he will begin steroids soon and was advised of the possible symptoms and side effects from taking steroids.  We discussed CRAB criteria and that he meets at least two of the criterion being  anemia and bone disease.  We discussed getting additional scans for further evaluation which he is agreeable to.  We further discussed starting Daratumumab/Velcade/Dexamethasone/Zometa and Revlimid for treatment which he is agreeable to. We also discussed starting steroids before treatment and taking an acid suppressant which he was also agreeable to.  We discussed getting Senna and taking it as needed for constipation.  Labs done today  were reviewed in detail.  We discussed his recent bone marrow biopsy and aspiration done 02/06/2022.  We discussed PET/CT scan done 01/28/2022.  We discussed CT right shoulder w/o contrast done 11/13/2021  INTERVAL HISTORY:  Anthony [name] is a 62 y.o. male, is here for continued evaluation of and management of multiple myeloma.  He is here for cycle 5-day 15 of treatment 6 with daratumumab carfilzomib and Revlimid. Patient notes no new acute symptoms since his last clinic visit.  No infection issues.  No new toxicities from his current treatment. Does note some grade 1 fatigue.  Grade 1 nausea with treatment. His bone marrow biopsy from 07/30/2022 was discussed in detail and shows no clear evidence of residual multiple myeloma. Labs done today were discussed in details Patient reiterates his desire to avoid bone marrow transplantation. We discussed that we will switch to maintenance carfilzomib and Revlimid after completing his sixth cycle of Dara carfilzomib Revlimid dexamethasone dialysis myeloma labs are stable.  MEDICAL HISTORY:  Past Medical History:  Diagnosis Date   Back pain    Cancer ([facility])    Multiple myeloma ([place]) 02/15/2022    SURGICAL HISTORY: Past Surgical History:  Procedure Laterality Date   IR BONE TUMOR(S)RF ABLATION  05/07/2022   IR BONE TUMOR(S)RF ABLATION  05/07/2022   IR BONE TUMOR(S)RF ABLATION  05/07/2022   IR KYPHO EA ADDL LEVEL THORACIC OR LUMBAR  05/07/2022   IR KYPHO LUMBAR INC FX REDUCE BONE BX UNI/BIL CANNULATION INC/IMAGING  05/07/2022   IR KYPHO THORACIC WITH BONE BIOPSY  05/07/2022  IR RADIOLOGIST EVAL & MGMT  04/23/2022   IR RADIOLOGIST EVAL & MGMT  05/17/2022   IR RADIOLOGIST EVAL & MGMT  06/06/2022    SOCIAL HISTORY: Social History   Socioeconomic History   Marital status: Married    Spouse name: Not on file   Number of children: Not on file   Years of education: Not on file   Highest education level: Not on file  Occupational History    Not on file  Tobacco Use   Smoking status: Never   Smokeless tobacco: Former    Types: Chew, Snuff  Vaping Use   Vaping Use: Never used  Substance and Sexual Activity   Alcohol use: Yes    Alcohol/week: 2.0 standard drinks of alcohol    Types: 2 Glasses of wine per week   Drug use: No   Sexual activity: Yes  Other Topics Concern   Not on file  Social History Narrative   Not on file   Social Determinants of Health   Financial Resource Strain: Low Risk  (05/07/2022)   Overall Financial Resource Strain (CARDIA)    Difficulty of Paying Living Expenses: Not very hard  Food Insecurity: No Food Insecurity (05/07/2022)   Hunger Vital Sign    Worried About Running Out of Food in the Last Year: Never true    Ran Out of Food in the Last Year: Never true  Transportation Needs: No Transportation Needs (05/07/2022)   PRAPARE - Hydrologist (Medical): No    Lack of Transportation (Non-Medical): No  Physical Activity: Not on file  Stress: Not on file  Social Connections: Not on file  Intimate Partner Violence: Unknown (05/07/2022)   Humiliation, Afraid, Rape, and Kick questionnaire    Fear of Current or Ex-Partner: No    Emotionally Abused: No    Physically Abused: No    Sexually Abused: Not on file    FAMILY HISTORY: Family History  Problem Relation Age of Onset   Rheum arthritis Mother    Other Sister        Pre-cancerous uterine mass;    Rheum arthritis Sister    Bone cancer Paternal Grandfather    Brain cancer Maternal Uncle    Brain cancer Maternal Aunt    Osteoporosis Neg Hx     ALLERGIES:  has No Known Allergies.  MEDICATIONS:  Current Outpatient Medications  Medication Sig Dispense Refill   acyclovir (ZOVIRAX) 400 MG tablet Take 1 tablet (400 mg total) by mouth 2 (two) times daily. 60 tablet 11   aluminum chloride (DRYSOL) 20 % external solution Apply 1 application topically at bedtime.     apixaban (ELIQUIS) 5 MG TABS tablet Take 1 tablet  (5 mg total) by mouth 2 (two) times daily. 60 tablet 5   calcium-vitamin D (OSCAL WITH D) 250-125 MG-UNIT tablet Take 1 tablet by mouth daily.     cyclobenzaprine (FLEXERIL) 10 MG tablet Take 10 mg by mouth 3 (three) times daily as needed for muscle spasms. (Patient not taking: Reported on 05/21/2022)     diclofenac sodium (VOLTAREN) 1 % GEL Apply 2 g topically as needed.     dronabinol (MARINOL) 5 MG capsule Take 1 capsule (5 mg total) by mouth 2 (two) times daily before a meal. 60 capsule 0   ergocalciferol (VITAMIN D2) 1.25 MG (50000 UT) capsule Take 1 capsule (50,000 Units total) by mouth 2 (two) times daily. 12 capsule 2   esomeprazole (NEXIUM) 40 MG capsule Take 1  capsule (40 mg total) by mouth daily before breakfast. 30 capsule 1   fentaNYL (DURAGESIC) 25 MCG/HR Place 1 patch onto the skin every 3 (three) days. 10 patch 0   furosemide (LASIX) 20 MG tablet Take 1 tablet (20 mg total) by mouth daily. 30 tablet 1   lidocaine (LIDODERM) 5 % Place 1 patch onto the skin daily. Remove & Discard patch within 12 hours or as directed by MD 30 patch 0   methocarbamol (ROBAXIN) 500 MG tablet Take 1 tablet (500 mg total) by mouth every 8 (eight) hours as needed for muscle spasms. 30 tablet 0   methylPREDNISolone (MEDROL DOSEPAK) 4 MG TBPK tablet Take 6 pills by mouth day 1, 5 on day 2, 4 on day 3, 3 on day 4, 2 on day 5, 1 on day 6 21 tablet 0   Multiple Vitamin (MULTIVITAMIN) tablet Take 1 tablet by mouth daily.     Omega-3 Fatty Acids (FISH OIL) 1360 MG CAPS Take 1 capsule by mouth daily.     ondansetron (ZOFRAN) 8 MG tablet Take 1 tablet (8 mg total) by mouth every 8 (eight) hours as needed for nausea or vomiting. 30 tablet 0   oxyCODONE (ROXICODONE) 5 MG immediate release tablet Take 1 tablet (5 mg total) by mouth every 4 (four) hours as needed for severe pain or moderate pain. Take 1 to 2 tablets every 4 hours as needed for moderate to severe pain. 60 tablet 0   REVLIMID 15 MG capsule Take 1 (15 mg)  tablet daily for 21 days and then take  7 days off. Then repeat . 21 capsule 0   senna-docusate (SENNA S) 8.6-50 MG tablet Take 2 tablets by mouth at bedtime. 60 tablet 1   terbinafine (LAMISIL) 1 % cream Apply 1 application topically 2 (two) times daily.     Current Facility-Administered Medications  Medication Dose Route Frequency Provider Last Rate Last Admin   methocarbamol (ROBAXIN) tablet 500 mg  500 mg Oral Q6H PRN Criselda Peaches, MD        10 Point review of Systems was done is negative except as noted above.  PHYSICAL EXAMINATION: .BP 132/78 (BP Location: Left Arm, Patient Position: Sitting)   Pulse 89   Temp 98.1 F (36.7 C) (Temporal)   Resp 18   Ht _0  (1.702 m)   Wt 194 lb 14.4 oz (88.4 kg)   SpO2 99%   BMI 30.53 kg/m  NAD GENERAL:alert, in no acute distress and comfortable SKIN: no acute rashes, no significant lesions EYES: conjunctiva are pink and non-injected, sclera anicteric OROPHARYNX: MMM, no exudates, no oropharyngeal erythema or ulceration NECK: supple, no JVD LYMPH:  no palpable lymphadenopathy in the cervical, axillary or inguinal regions LUNGS: clear to auscultation b/l with normal respiratory effort HEART: regular rate & rhythm ABDOMEN:  normoactive bowel sounds , non tender, not distended. Extremity: no pedal edema PSYCH: alert & oriented x 3 with fluent speech NEURO: no focal motor/sensory deficits   LABORATORY DATA:  I have reviewed the data as listed .    Latest Ref Rng & Units 08/14/2022    9:40 AM 08/07/2022   12:30 PM 07/31/2022    9:45 AM  CBC  WBC 4.0 - 10.5 K/uL 6.2  3.3  3.5   Hemoglobin 13.0 - 17.0 g/dL 15.0  14.9  14.1   Hematocrit 39.0 - 52.0 % 43.4  43.1  41.8   Platelets 150 - 400 K/uL 150  99  178  Latest Ref Rng & Units 08/14/2022    9:40 AM 08/07/2022   12:30 PM 07/31/2022    9:45 AM  CMP  Glucose 70 - 99 mg/dL 108  146  100   BUN 8 - 23 mg/dL _0 Creatinine 0.61 - 1.24 mg/dL 0.92  0.87  0.73    Sodium 135 - 145 mmol/L 136  135  138   Potassium 3.5 - 5.1 mmol/L 4.0  3.7  4.0   Chloride 98 - 111 mmol/L 105  103  107   CO2 22 - 32 mmol/L _1 Calcium 8.9 - 10.3 mg/dL 9.0  9.7  9.6   Total Protein 6.5 - 8.1 g/dL 6.2  6.3  5.9   Total Bilirubin 0.3 - 1.2 mg/dL 0.9  0.7  0.7   Alkaline Phos 38 - 126 U/L 51  59  48   AST 15 - 41 U/L _2 ALT 0 - 44 U/L _3 PATHOLOGY Surgical Pathology CASE: [id number] PATIENT: Syd Alvelo Bone Marrow Report     Clinical History: Multiple Myeloma     DIAGNOSIS:  BONE MARROW, ASPIRATE, CLOT, CORE: -Hypercellular bone marrow (60%) with erythroid predominant trilineage hematopoiesis and no evidence of residual plasma cell neoplasm (less than 1% plasma cells by manual aspirate differential, and CD138 immunohistochemical analysis of clot and core sections).  PERIPHERAL BLOOD: -Leukopenia  MICROSCOPIC DESCRIPTION:  PERIPHERAL BLOOD SMEAR: Platelets: Adequate in number, no platelet clumps identified Erythroid: Normocytic normochromic red blood cells Leukocytes: Mild leukopenia, negative for dysplastic granulocytes, blasts or  plasma cells  BONE MARROW ASPIRATE: Cellular Erythroid precursors: Relatively increased, show a full sequence of maturation with occasional abnormal forms (including binucleate forms, megaloblastoid change and nuclear membrane irregularities) Granulocytic precursors: Decreased, show a full sequence of generally orderly maturation Megakaryocytes: Normal in number and morphology Lymphocytes/plasma cells: Not increased   02/06/2022 Molecular pathology   RADIOGRAPHIC STUDIES: I have personally reviewed the radiological images as listed and agreed with the findings in the report. CT BONE MARROW BIOPSY & ASPIRATION  Result Date: 07/30/2022 INDICATION: Multiple myeloma EXAM: CT GUIDED BONE MARROW ASPIRATES AND BIOPSY MEDICATIONS: 2 mg IV Versed ANESTHESIA/SEDATION: None  COMPLICATIONS: None immediate. PROCEDURE: The procedure was explained to the patient. The risks and benefits of the procedure were discussed and the patient's questions were addressed. Informed consent was obtained from the patient. The patient was placed prone on CT table. Images of the pelvis were obtained. The right side of back was prepped and draped in sterile fashion. The skin and right posterior ilium were anesthetized with 10 mL of 1% lidocaine. 11 gauge bone needle was directed into the right ilium with CT guidance. Two aspirates and 1 core biopsy were obtained. Samples were adequate per pathology technologist. Additional 5 mL of 0.5% Sensorcaine was administered at the biopsy site. Bandage placed over the puncture site. IMPRESSION: CT guided bone marrow aspiration and core biopsy. Electronically Signed   By: Miachel Roux M.D.   On: 07/30/2022 10:24   NM PET Image Restage (PS) Whole Body  Result Date: 07/29/2022 CLINICAL DATA:  Subsequent treatment strategy for multiple myeloma on chemotherapy. EXAM: NUCLEAR MEDICINE PET WHOLE BODY TECHNIQUE: 9.8 mCi F-18 FDG was injected intravenously. Full-ring PET imaging was performed from the head to foot after the radiotracer. CT data was obtained and used for attenuation correction and anatomic localization. Fasting blood glucose:  87 mg/dl COMPARISON:  01/28/2022 PET and the CT of the abdomen and pelvis 02/23/2022. FINDINGS: Mediastinal blood pool activity: SUV max 2.5 HEAD/NECK: No areas of abnormal hypermetabolism. Incidental CT findings: No cervical adenopathy. CHEST: No pulmonary parenchymal or thoracic nodal hypermetabolism. Incidental CT findings: Mild cardiomegaly. Calcified right hilar and mediastinal nodes are likely related to old granulomatous disease. Increase in moderate hiatal hernia. Right lower lobe calcified granuloma. ABDOMEN/PELVIS: No abdominopelvic parenchymal or nodal hypermetabolism. Incidental CT findings: Right adrenal 2.3 cm nodule  measures 0 HU, consistent with an adenoma . In the absence of clinically indicated signs/symptoms require(s) no independent follow-up. abdominal aortic atherosclerosis. No abdominopelvic adenopathy. Old granulomatous disease in the spleen. Scattered colonic diverticula. Moderate prostatomegaly. SKELETON: The lateral right fifth rib lesion measures a S.U.V. max of 3.5 today versus a S.U.V. max of 4.7 on the prior. Hypermetabolism just posterior to this within the sixth right rib is secondary to a healing fracture. Pelvic hypermetabolism has in general improved. For example, right sacral hypermetabolism at a S.U.V. max of 2.4 today versus a S.U.V. max of 5.4 on the prior exam. Incidental CT findings: Diffuse mottled appearance of the axial and appendicular spine again identified. Vertebral augmentation at T11, T12, L2. EXTREMITIES: The right humeral head index lesion is more well-defined today. 2.9 cm and a S.U.V. max of 2.1 on 77/4 versus 3.2 cm and a S.U.V. max of 4.4 on the prior. Incidental CT findings: Mottled appearance involves the proximal femurs bilaterally. Right supraspinatus intramuscular lipoma of 6.6 cm. IMPRESSION: 1. Response to therapy of osseous myeloma. 2. No extraosseous myeloma identified. 3. Increase in moderate hiatal hernia. 4. Incidental findings, including: Right adrenal adenoma. Aortic Atherosclerosis (ICD10-I70.0). Prostatomegaly. Electronically Signed   By: Abigail Miyamoto M.D.   On: 07/29/2022 13:49     ASSESSMENT & PLAN:   62 y.o. very pleasant male with  1. R-ISS stage III multiple myeloma with extensive bone metastases and patholgiic fracture rt shoulder -Recent bone marrow biopsy and aspiration done 02/06/2022 revealed hypercellular bone marrow with plasma cell neoplasm. 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  Plan: -Patient is here for cycle 5-day 15 of his current treatment regimen -Labs done today were reviewed in  details -We discussed his bone marrow biopsy results from 07/30/2022 which show no evidence suggestive of myeloma. -PET/CT scan was previously discussed -Patient has no notable toxicities from his current regimen other than some grade 1 nausea and grade 1 fatigue. -He has made final decision to not pursue autologous hematopoietic stem cell transplantation at this time. -we shall continue his daratumumab/carfilzomib/Revlimid/dexamethasone regimen for another 1-2 cycles to consolidate his improvement and then plan to transition to a maintenance regimen. -We discussed that we shall likely switch to carfilzomib every 2 weeks and Revlimid 15 mg 21 days on 1 week off regimen for maintenance. -Continue as needed oxycodone for cancer-related pain.  FOLLOW-UP: Per integrated scheduling   The total time spent in the appointment was 30 minutes*.  All of the patient's questions were answered with apparent satisfaction. The patient knows to call the clinic with any problems, questions or concerns.   Sullivan Lone MD MS AAHIVMS [facility] Hematology/Oncology Physician [facility]  .*Total Encounter Time as defined by the Centers for Medicare and Medicaid Services includes, in addition to the face-to-face time of a patient visit (documented in the note above) non-face-to-face time: obtaining and reviewing outside history, ordering and reviewing medications, tests or procedures, care coordination (communications with other health  care professionals or caregivers) and documentation in the medical record.

## 2022-08-21 MED ORDER — FENTANYL 25 MCG/HR TD PT72: 1.0000 | MEDICATED_PATCH | TRANSDERMAL | 0 refills | Status: DC

## 2022-08-21 MED ORDER — DRONABINOL 5 MG PO CAPS: 5.0000 mg | ORAL_CAPSULE | Freq: Two times a day (BID) | ORAL | 0 refills | Status: DC

## 2022-08-25 ENCOUNTER — Other Ambulatory Visit: Payer: Self-pay | Admitting: Hematology

## 2022-08-25 DIAGNOSIS — C9000 Multiple myeloma not having achieved remission: Secondary | ICD-10-CM

## 2022-08-26 ENCOUNTER — Other Ambulatory Visit: Payer: Self-pay | Admitting: Hematology

## 2022-08-26 ENCOUNTER — Other Ambulatory Visit: Payer: Self-pay

## 2022-08-26 DIAGNOSIS — C9000 Multiple myeloma not having achieved remission: Secondary | ICD-10-CM

## 2022-08-27 ENCOUNTER — Other Ambulatory Visit: Payer: Self-pay

## 2022-08-27 MED ORDER — FENTANYL 25 MCG/HR TD PT72: 1.0000 | MEDICATED_PATCH | TRANSDERMAL | 0 refills | Status: DC

## 2022-08-27 MED FILL — Dexamethasone Sodium Phosphate Inj 100 MG/10ML: INTRAMUSCULAR | Qty: 2 | Status: AC

## 2022-08-28 ENCOUNTER — Other Ambulatory Visit: Payer: Self-pay | Admitting: Hematology

## 2022-08-28 ENCOUNTER — Inpatient Hospital Stay: Payer: BC Managed Care – PPO

## 2022-08-28 ENCOUNTER — Inpatient Hospital Stay: Payer: BC Managed Care – PPO | Attending: Physician Assistant

## 2022-08-28 ENCOUNTER — Inpatient Hospital Stay: Payer: BC Managed Care – PPO | Admitting: Hematology

## 2022-08-28 ENCOUNTER — Inpatient Hospital Stay: Payer: BC Managed Care – PPO | Admitting: Dietician

## 2022-08-28 VITALS — BP 135/78 | HR 86 | Temp 98.7°F | Resp 16 | Ht 67.0 in | Wt 193.5 lb

## 2022-08-28 DIAGNOSIS — Z7189 Other specified counseling: Secondary | ICD-10-CM

## 2022-08-28 DIAGNOSIS — C9000 Multiple myeloma not having achieved remission: Secondary | ICD-10-CM | POA: Insufficient documentation

## 2022-08-28 DIAGNOSIS — M545 Low back pain, unspecified: Secondary | ICD-10-CM | POA: Diagnosis not present

## 2022-08-28 DIAGNOSIS — Z79891 Long term (current) use of opiate analgesic: Secondary | ICD-10-CM | POA: Insufficient documentation

## 2022-08-28 DIAGNOSIS — R059 Cough, unspecified: Secondary | ICD-10-CM | POA: Insufficient documentation

## 2022-08-28 DIAGNOSIS — R5383 Other fatigue: Secondary | ICD-10-CM | POA: Insufficient documentation

## 2022-08-28 DIAGNOSIS — Z5112 Encounter for antineoplastic immunotherapy: Secondary | ICD-10-CM | POA: Insufficient documentation

## 2022-08-28 DIAGNOSIS — Z87891 Personal history of nicotine dependence: Secondary | ICD-10-CM | POA: Insufficient documentation

## 2022-08-28 DIAGNOSIS — D3501 Benign neoplasm of right adrenal gland: Secondary | ICD-10-CM | POA: Diagnosis not present

## 2022-08-28 DIAGNOSIS — I700 Atherosclerosis of aorta: Secondary | ICD-10-CM | POA: Insufficient documentation

## 2022-08-28 DIAGNOSIS — Z7961 Long term (current) use of immunomodulator: Secondary | ICD-10-CM | POA: Diagnosis not present

## 2022-08-28 DIAGNOSIS — R0602 Shortness of breath: Secondary | ICD-10-CM | POA: Insufficient documentation

## 2022-08-28 DIAGNOSIS — R110 Nausea: Secondary | ICD-10-CM | POA: Insufficient documentation

## 2022-08-28 LAB — CBC WITH DIFFERENTIAL (CANCER CENTER ONLY)
Abs Immature Granulocytes: 0.01 10*3/uL (ref 0.00–0.07)
Basophils Absolute: 0.1 10*3/uL (ref 0.0–0.1)
Basophils Relative: 1 %
Eosinophils Absolute: 0.4 10*3/uL (ref 0.0–0.5)
Eosinophils Relative: 8 %
HCT: 44.3 % (ref 39.0–52.0)
Hemoglobin: 15 g/dL (ref 13.0–17.0)
Immature Granulocytes: 0 %
Lymphocytes Relative: 20 %
Lymphs Abs: 1 10*3/uL (ref 0.7–4.0)
MCH: 29.2 pg (ref 26.0–34.0)
MCHC: 33.9 g/dL (ref 30.0–36.0)
MCV: 86.4 fL (ref 80.0–100.0)
Monocytes Absolute: 0.5 10*3/uL (ref 0.1–1.0)
Monocytes Relative: 10 %
Neutro Abs: 3 10*3/uL (ref 1.7–7.7)
Neutrophils Relative %: 61 %
Platelet Count: 201 10*3/uL (ref 150–400)
RBC: 5.13 MIL/uL (ref 4.22–5.81)
RDW: 14 % (ref 11.5–15.5)
WBC Count: 4.8 10*3/uL (ref 4.0–10.5)
nRBC: 0 % (ref 0.0–0.2)

## 2022-08-28 LAB — CMP (CANCER CENTER ONLY)
ALT: 14 U/L (ref 0–44)
AST: 14 U/L — ABNORMAL LOW (ref 15–41)
Albumin: 4.4 g/dL (ref 3.5–5.0)
Alkaline Phosphatase: 52 U/L (ref 38–126)
Anion gap: 6 (ref 5–15)
BUN: 12 mg/dL (ref 8–23)
CO2: 26 mmol/L (ref 22–32)
Calcium: 9.1 mg/dL (ref 8.9–10.3)
Chloride: 105 mmol/L (ref 98–111)
Creatinine: 0.87 mg/dL (ref 0.61–1.24)
GFR, Estimated: >60 mL/min (ref >60)
Glucose, Bld: 80 mg/dL (ref 70–99)
Potassium: 4.3 mmol/L (ref 3.5–5.1)
Sodium: 137 mmol/L (ref 135–145)
Total Bilirubin: 0.7 mg/dL (ref 0.3–1.2)
Total Protein: 6.5 g/dL (ref 6.5–8.1)

## 2022-08-28 MED ORDER — DIPHENHYDRAMINE HCL 25 MG PO CAPS
50.0000 mg | ORAL_CAPSULE | Freq: Once | ORAL | Status: AC
  Administered 2022-08-28: 50 mg via ORAL
  Filled 2022-08-28: qty 2

## 2022-08-28 MED ORDER — PALONOSETRON HCL INJECTION 0.25 MG/5ML
0.2500 mg | Freq: Once | INTRAVENOUS | Status: AC
  Administered 2022-08-28: 0.25 mg via INTRAVENOUS
  Filled 2022-08-28: qty 5

## 2022-08-28 MED ORDER — SODIUM CHLORIDE 0.9 % IV SOLN
16.0000 mg/kg | Freq: Once | INTRAVENOUS | Status: AC
  Administered 2022-08-28: 1400 mg via INTRAVENOUS
  Filled 2022-08-28: qty 60

## 2022-08-28 MED ORDER — ACETAMINOPHEN 325 MG PO TABS
650.0000 mg | ORAL_TABLET | Freq: Once | ORAL | Status: AC
  Administered 2022-08-28: 650 mg via ORAL
  Filled 2022-08-28: qty 2

## 2022-08-28 MED ORDER — SODIUM CHLORIDE 0.9 % IV SOLN: Freq: Once | INTRAVENOUS | Status: AC

## 2022-08-28 MED ORDER — DEXTROSE 5 % IV SOLN
70.0000 mg/m2 | Freq: Once | INTRAVENOUS | Status: AC
  Administered 2022-08-28: 140 mg via INTRAVENOUS
  Filled 2022-08-28: qty 60

## 2022-08-28 MED ORDER — SODIUM CHLORIDE 0.9 % IV SOLN
20.0000 mg | Freq: Once | INTRAVENOUS | Status: AC
  Administered 2022-08-28: 20 mg via INTRAVENOUS
  Filled 2022-08-28: qty 20

## 2022-08-28 MED ORDER — FAMOTIDINE IN NACL 20-0.9 MG/50ML-% IV SOLN
20.0000 mg | Freq: Once | INTRAVENOUS | Status: AC
  Administered 2022-08-28: 20 mg via INTRAVENOUS
  Filled 2022-08-28: qty 50

## 2022-08-28 NOTE — Progress Notes (Signed)
Spoke w/ PA ok to proceed w/ aloxi   Larene Beach, PharmD

## 2022-08-28 NOTE — Patient Instructions (Signed)
[facility] ONCOLOG[facility]   Discharge Instructions: Thank you for choosing [facility] to provide your oncology and hematology care.   If you have a lab appointment with the [facility], please go directly to the [facility] and check in at the registration area.   Wear comfortable clothing and clothing appropriate for easy access to any Portacath or PICC line.   We strive to give you quality time with your provider. You may need to reschedule your appointment if you arrive late (15 or more minutes).  Arriving late affects you and other patients whose appointments are after yours.  Also, if you miss three or more appointments without notifying the office, you may be dismissed from the clinic at the provider's discretion.      For prescription refill requests, have your pharmacy contact our office and allow 72 hours for refills to be completed.    Today you received the following chemotherapy and/or immunotherapy agents: Carfilzomib (Kyprolis) and Daratumumab (Darzalex)       To help prevent nausea and vomiting after your treatment, we encourage you to take your nausea medication as directed.  BELOW ARE SYMPTOMS THAT SHOULD BE REPORTED IMMEDIATELY: *FEVER GREATER THAN 100.4 F (38 C) OR HIGHER *CHILLS OR SWEATING *NAUSEA AND VOMITING THAT IS NOT CONTROLLED WITH YOUR NAUSEA MEDICATION *UNUSUAL SHORTNESS OF BREATH *UNUSUAL BRUISING OR BLEEDING *URINARY PROBLEMS (pain or burning when urinating, or frequent urination) *BOWEL PROBLEMS (unusual diarrhea, constipation, pain near the anus) TENDERNESS IN MOUTH AND THROAT WITH OR WITHOUT PRESENCE OF ULCERS (sore throat, sores in mouth, or a toothache) UNUSUAL RASH, SWELLING OR PAIN  UNUSUAL VAGINAL DISCHARGE OR ITCHING   Items with * indicate a potential emergency and should be followed up as soon as possible or go to the Emergency Department if any problems should occur.  Please show the CHEMOTHERAPY ALERT CARD  or IMMUNOTHERAPY ALERT CARD at check-in to the Emergency Department and triage nurse.  Should you have questions after your visit or need to cancel or reschedule your appointment, please contact [facility]  Dept: [phone]  and follow the prompts.  Office hours are 8:00 a.m. to 4:30 p.m. Monday - Friday. Please note that voicemails left after 4:00 p.m. may not be returned until the following business day.  We are closed weekends and major holidays. You have access to a nurse at all times for urgent questions. Please call the main number to the clinic Dept: [phone] and follow the prompts.   For any non-urgent questions, you may also contact your provider using MyChart. We now offer e-Visits for anyone 83 and older to request care online for non-urgent symptoms. For details visit [email or web address].   Also download the MyChart app! Go to the app store, search "MyChart", open the app, select [facility], and log in with your MyChart username and password.

## 2022-08-28 NOTE — Progress Notes (Signed)
Nutrition Follow-up:  Patient with multiple myeloma. He is receiving carfilzomib, daratumumab, revlimid + dexamethasone.    Patient reports ongoing decreased appetite. He is pushing himself to eat. Patient felt he has been doing better the last few weeks and surprised that he had not gained weight. Patient reports nausea has improved with adjustments to IV antiemetics + home regimen. He denies vomiting, constipation, or diarrhea.    Medications: reviewed  Labs: reviewed   Anthropometrics: Wt 193 lb 8 oz today slightly decreased  12/27 - 194 lb 14.4 oz 12/23 - 195 lb 4.8 oz    NUTRITION DIAGNOSIS: Unintended weight loss continues     INTERVENTION:  Continue strategies for increasing calories and protein with small frequent meals and snacks Encouraged high protein bedtime snack    MONITORING, EVALUATION, GOAL: weight trends, intake   NEXT VISIT: Wednesday February 21 during infusion

## 2022-08-29 ENCOUNTER — Other Ambulatory Visit: Payer: Self-pay

## 2022-08-29 ENCOUNTER — Other Ambulatory Visit: Payer: Self-pay | Admitting: Hematology

## 2022-09-02 LAB — MULTIPLE MYELOMA PANEL, SERUM
Albumin SerPl Elph-Mcnc: 3.8 g/dL (ref 2.9–4.4)
Albumin/Glob SerPl: 1.9 — ABNORMAL HIGH (ref 0.7–1.7)
Alpha 1: 0.3 g/dL (ref 0.0–0.4)
Alpha2 Glob SerPl Elph-Mcnc: 0.6 g/dL (ref 0.4–1.0)
B-Globulin SerPl Elph-Mcnc: 0.9 g/dL (ref 0.7–1.3)
Gamma Glob SerPl Elph-Mcnc: 0.3 g/dL — ABNORMAL LOW (ref 0.4–1.8)
Globulin, Total: 2.1 g/dL — ABNORMAL LOW (ref 2.2–3.9)
IgA: 20 mg/dL — ABNORMAL LOW (ref 61–437)
IgG (Immunoglobin G), Serum: 461 mg/dL — ABNORMAL LOW (ref 603–1613)
IgM (Immunoglobulin M), Srm: 11 mg/dL — ABNORMAL LOW (ref 20–172)
M Protein SerPl Elph-Mcnc: 0.1 g/dL — ABNORMAL HIGH
Total Protein ELP: 5.9 g/dL — ABNORMAL LOW (ref 6.0–8.5)

## 2022-09-03 MED FILL — Dexamethasone Sodium Phosphate Inj 100 MG/10ML: INTRAMUSCULAR | Qty: 2 | Status: AC

## 2022-09-04 ENCOUNTER — Other Ambulatory Visit: Payer: Self-pay

## 2022-09-04 ENCOUNTER — Inpatient Hospital Stay: Payer: BC Managed Care – PPO

## 2022-09-04 VITALS — BP 122/78 | HR 78 | Temp 98.0°F | Resp 18 | Wt 194.0 lb

## 2022-09-04 DIAGNOSIS — R0602 Shortness of breath: Secondary | ICD-10-CM | POA: Diagnosis not present

## 2022-09-04 DIAGNOSIS — Z7189 Other specified counseling: Secondary | ICD-10-CM

## 2022-09-04 DIAGNOSIS — R110 Nausea: Secondary | ICD-10-CM | POA: Diagnosis not present

## 2022-09-04 DIAGNOSIS — M545 Low back pain, unspecified: Secondary | ICD-10-CM | POA: Diagnosis not present

## 2022-09-04 DIAGNOSIS — R059 Cough, unspecified: Secondary | ICD-10-CM | POA: Diagnosis not present

## 2022-09-04 DIAGNOSIS — C9000 Multiple myeloma not having achieved remission: Secondary | ICD-10-CM

## 2022-09-04 DIAGNOSIS — Z79891 Long term (current) use of opiate analgesic: Secondary | ICD-10-CM | POA: Diagnosis not present

## 2022-09-04 DIAGNOSIS — R5383 Other fatigue: Secondary | ICD-10-CM | POA: Diagnosis not present

## 2022-09-04 DIAGNOSIS — Z5112 Encounter for antineoplastic immunotherapy: Secondary | ICD-10-CM | POA: Diagnosis not present

## 2022-09-04 DIAGNOSIS — Z87891 Personal history of nicotine dependence: Secondary | ICD-10-CM | POA: Diagnosis not present

## 2022-09-04 DIAGNOSIS — Z7961 Long term (current) use of immunomodulator: Secondary | ICD-10-CM | POA: Diagnosis not present

## 2022-09-04 DIAGNOSIS — I700 Atherosclerosis of aorta: Secondary | ICD-10-CM | POA: Diagnosis not present

## 2022-09-04 DIAGNOSIS — D3501 Benign neoplasm of right adrenal gland: Secondary | ICD-10-CM | POA: Diagnosis not present

## 2022-09-04 LAB — CMP (CANCER CENTER ONLY)
ALT: 15 U/L (ref 0–44)
AST: 13 U/L — ABNORMAL LOW (ref 15–41)
Albumin: 4.1 g/dL (ref 3.5–5.0)
Alkaline Phosphatase: 50 U/L (ref 38–126)
Anion gap: 7 (ref 5–15)
BUN: 15 mg/dL (ref 8–23)
CO2: 23 mmol/L (ref 22–32)
Calcium: 9 mg/dL (ref 8.9–10.3)
Chloride: 103 mmol/L (ref 98–111)
Creatinine: 0.86 mg/dL (ref 0.61–1.24)
GFR, Estimated: >60 mL/min (ref >60)
Glucose, Bld: 129 mg/dL — ABNORMAL HIGH (ref 70–99)
Potassium: 3.9 mmol/L (ref 3.5–5.1)
Sodium: 133 mmol/L — ABNORMAL LOW (ref 135–145)
Total Bilirubin: 0.7 mg/dL (ref 0.3–1.2)
Total Protein: 6 g/dL — ABNORMAL LOW (ref 6.5–8.1)

## 2022-09-04 LAB — CBC WITH DIFFERENTIAL (CANCER CENTER ONLY)
Abs Immature Granulocytes: 0.02 10*3/uL (ref 0.00–0.07)
Basophils Absolute: 0 10*3/uL (ref 0.0–0.1)
Basophils Relative: 0 %
Eosinophils Absolute: 0.3 10*3/uL (ref 0.0–0.5)
Eosinophils Relative: 4 %
HCT: 44.7 % (ref 39.0–52.0)
Hemoglobin: 15.4 g/dL (ref 13.0–17.0)
Immature Granulocytes: 0 %
Lymphocytes Relative: 17 %
Lymphs Abs: 1.2 10*3/uL (ref 0.7–4.0)
MCH: 29.3 pg (ref 26.0–34.0)
MCHC: 34.5 g/dL (ref 30.0–36.0)
MCV: 85.1 fL (ref 80.0–100.0)
Monocytes Absolute: 1.2 10*3/uL — ABNORMAL HIGH (ref 0.1–1.0)
Monocytes Relative: 17 %
Neutro Abs: 4.3 10*3/uL (ref 1.7–7.7)
Neutrophils Relative %: 62 %
Platelet Count: 143 10*3/uL — ABNORMAL LOW (ref 150–400)
RBC: 5.25 MIL/uL (ref 4.22–5.81)
RDW: 13.9 % (ref 11.5–15.5)
WBC Count: 6.9 10*3/uL (ref 4.0–10.5)
nRBC: 0 % (ref 0.0–0.2)

## 2022-09-04 MED ORDER — SODIUM CHLORIDE 0.9 % IV SOLN: Freq: Once | INTRAVENOUS | Status: AC

## 2022-09-04 MED ORDER — ZOLEDRONIC ACID 4 MG/100ML IV SOLN
4.0000 mg | Freq: Once | INTRAVENOUS | Status: AC
  Administered 2022-09-04: 4 mg via INTRAVENOUS
  Filled 2022-09-04: qty 100

## 2022-09-04 MED ORDER — SODIUM CHLORIDE 0.9 % IV SOLN
20.0000 mg | Freq: Once | INTRAVENOUS | Status: AC
  Administered 2022-09-04: 20 mg via INTRAVENOUS
  Filled 2022-09-04: qty 20

## 2022-09-04 MED ORDER — DEXTROSE 5 % IV SOLN
70.0000 mg/m2 | Freq: Once | INTRAVENOUS | Status: AC
  Administered 2022-09-04: 140 mg via INTRAVENOUS
  Filled 2022-09-04: qty 60

## 2022-09-04 MED ORDER — SODIUM CHLORIDE 0.9 % IV SOLN: Freq: Once | INTRAVENOUS | Status: DC

## 2022-09-04 MED ORDER — PROCHLORPERAZINE MALEATE 10 MG PO TABS
10.0000 mg | ORAL_TABLET | Freq: Four times a day (QID) | ORAL | Status: DC | PRN
  Administered 2022-09-04: 10 mg via ORAL
  Filled 2022-09-04 (×2): qty 1

## 2022-09-04 NOTE — Patient Instructions (Signed)
[facility]   Discharge Instructions: Thank you for choosing [facility] to provide your oncology and hematology care.   If you have a lab appointment with the [facility], please go directly to the [facility] and check in at the registration area.   Wear comfortable clothing and clothing appropriate for easy access to any Portacath or PICC line.   We strive to give you quality time with your provider. You may need to reschedule your appointment if you arrive late (15 or more minutes).  Arriving late affects you and other patients whose appointments are after yours.  Also, if you miss three or more appointments without notifying the office, you may be dismissed from the clinic at the provider's discretion.      For prescription refill requests, have your pharmacy contact our office and allow 72 hours for refills to be completed.    Today you received the following chemotherapy and/or immunotherapy agents: Carfilzomib (Kyprolis) , zometa    To help prevent nausea and vomiting after your treatment, we encourage you to take your nausea medication as directed.  BELOW ARE SYMPTOMS THAT SHOULD BE REPORTED IMMEDIATELY: *FEVER GREATER THAN 100.4 F (38 C) OR HIGHER *CHILLS OR SWEATING *NAUSEA AND VOMITING THAT IS NOT CONTROLLED WITH YOUR NAUSEA MEDICATION *UNUSUAL SHORTNESS OF BREATH *UNUSUAL BRUISING OR BLEEDING *URINARY PROBLEMS (pain or burning when urinating, or frequent urination) *BOWEL PROBLEMS (unusual diarrhea, constipation, pain near the anus) TENDERNESS IN MOUTH AND THROAT WITH OR WITHOUT PRESENCE OF ULCERS (sore throat, sores in mouth, or a toothache) UNUSUAL RASH, SWELLING OR PAIN  UNUSUAL VAGINAL DISCHARGE OR ITCHING   Items with * indicate a potential emergency and should be followed up as soon as possible or go to the Emergency Department if any problems should occur.  Please show the CHEMOTHERAPY ALERT CARD or IMMUNOTHERAPY  ALERT CARD at check-in to the Emergency Department and triage nurse.  Should you have questions after your visit or need to cancel or reschedule your appointment, please contact [facility]  Dept: [phone]  and follow the prompts.  Office hours are 8:00 a.m. to 4:30 p.m. Monday - Friday. Please note that voicemails left after 4:00 p.m. may not be returned until the following business day.  We are closed weekends and major holidays. You have access to a nurse at all times for urgent questions. Please call the main number to the clinic Dept: [phone] and follow the prompts.   For any non-urgent questions, you may also contact your provider using MyChart. We now offer e-Visits for anyone 21 and older to request care online for non-urgent symptoms. For details visit [email or web address].   Also download the MyChart app! Go to the app store, search "MyChart", open the app, select , and log in with your MyChart username and password.

## 2022-09-10 ENCOUNTER — Other Ambulatory Visit: Payer: Self-pay | Admitting: Hematology

## 2022-09-10 MED FILL — Dexamethasone Sodium Phosphate Inj 100 MG/10ML: INTRAMUSCULAR | Qty: 2 | Status: AC

## 2022-09-11 ENCOUNTER — Inpatient Hospital Stay: Payer: BC Managed Care – PPO

## 2022-09-11 VITALS — BP 139/71 | HR 98 | Temp 97.7°F | Resp 18 | Wt 193.1 lb

## 2022-09-11 DIAGNOSIS — M545 Low back pain, unspecified: Secondary | ICD-10-CM | POA: Diagnosis not present

## 2022-09-11 DIAGNOSIS — C9000 Multiple myeloma not having achieved remission: Secondary | ICD-10-CM

## 2022-09-11 DIAGNOSIS — Z7189 Other specified counseling: Secondary | ICD-10-CM

## 2022-09-11 DIAGNOSIS — R0602 Shortness of breath: Secondary | ICD-10-CM | POA: Diagnosis not present

## 2022-09-11 DIAGNOSIS — I700 Atherosclerosis of aorta: Secondary | ICD-10-CM | POA: Diagnosis not present

## 2022-09-11 DIAGNOSIS — D3501 Benign neoplasm of right adrenal gland: Secondary | ICD-10-CM | POA: Diagnosis not present

## 2022-09-11 DIAGNOSIS — R110 Nausea: Secondary | ICD-10-CM | POA: Diagnosis not present

## 2022-09-11 DIAGNOSIS — R5383 Other fatigue: Secondary | ICD-10-CM | POA: Diagnosis not present

## 2022-09-11 DIAGNOSIS — Z79891 Long term (current) use of opiate analgesic: Secondary | ICD-10-CM | POA: Diagnosis not present

## 2022-09-11 DIAGNOSIS — Z7961 Long term (current) use of immunomodulator: Secondary | ICD-10-CM | POA: Diagnosis not present

## 2022-09-11 DIAGNOSIS — Z5112 Encounter for antineoplastic immunotherapy: Secondary | ICD-10-CM | POA: Diagnosis not present

## 2022-09-11 DIAGNOSIS — R059 Cough, unspecified: Secondary | ICD-10-CM | POA: Diagnosis not present

## 2022-09-11 DIAGNOSIS — Z87891 Personal history of nicotine dependence: Secondary | ICD-10-CM | POA: Diagnosis not present

## 2022-09-11 LAB — CBC WITH DIFFERENTIAL (CANCER CENTER ONLY)
Abs Immature Granulocytes: 0.01 10*3/uL (ref 0.00–0.07)
Basophils Absolute: 0 10*3/uL (ref 0.0–0.1)
Basophils Relative: 0 %
Eosinophils Absolute: 0.4 10*3/uL (ref 0.0–0.5)
Eosinophils Relative: 9 %
HCT: 45.3 % (ref 39.0–52.0)
Hemoglobin: 15.5 g/dL (ref 13.0–17.0)
Immature Granulocytes: 0 %
Lymphocytes Relative: 18 %
Lymphs Abs: 0.9 10*3/uL (ref 0.7–4.0)
MCH: 29.1 pg (ref 26.0–34.0)
MCHC: 34.2 g/dL (ref 30.0–36.0)
MCV: 85.2 fL (ref 80.0–100.0)
Monocytes Absolute: 0.8 10*3/uL (ref 0.1–1.0)
Monocytes Relative: 17 %
Neutro Abs: 2.8 10*3/uL (ref 1.7–7.7)
Neutrophils Relative %: 56 %
Platelet Count: 112 10*3/uL — ABNORMAL LOW (ref 150–400)
RBC: 5.32 MIL/uL (ref 4.22–5.81)
RDW: 14 % (ref 11.5–15.5)
WBC Count: 5 10*3/uL (ref 4.0–10.5)
nRBC: 0 % (ref 0.0–0.2)

## 2022-09-11 LAB — CMP (CANCER CENTER ONLY)
ALT: 15 U/L (ref 0–44)
AST: 12 U/L — ABNORMAL LOW (ref 15–41)
Albumin: 3.9 g/dL (ref 3.5–5.0)
Alkaline Phosphatase: 64 U/L (ref 38–126)
Anion gap: 7 (ref 5–15)
BUN: 16 mg/dL (ref 8–23)
CO2: 26 mmol/L (ref 22–32)
Calcium: 9.5 mg/dL (ref 8.9–10.3)
Chloride: 105 mmol/L (ref 98–111)
Creatinine: 0.89 mg/dL (ref 0.61–1.24)
GFR, Estimated: >60 mL/min (ref >60)
Glucose, Bld: 83 mg/dL (ref 70–99)
Potassium: 4.1 mmol/L (ref 3.5–5.1)
Sodium: 138 mmol/L (ref 135–145)
Total Bilirubin: 0.6 mg/dL (ref 0.3–1.2)
Total Protein: 6.1 g/dL — ABNORMAL LOW (ref 6.5–8.1)

## 2022-09-11 MED ORDER — SODIUM CHLORIDE 0.9 % IV SOLN: Freq: Once | INTRAVENOUS | Status: AC

## 2022-09-11 MED ORDER — SODIUM CHLORIDE 0.9 % IV SOLN
16.0000 mg/kg | Freq: Once | INTRAVENOUS | Status: AC
  Administered 2022-09-11: 1400 mg via INTRAVENOUS
  Filled 2022-09-11: qty 60

## 2022-09-11 MED ORDER — ACETAMINOPHEN 325 MG PO TABS
650.0000 mg | ORAL_TABLET | Freq: Once | ORAL | Status: AC
  Administered 2022-09-11: 650 mg via ORAL
  Filled 2022-09-11: qty 2

## 2022-09-11 MED ORDER — FAMOTIDINE IN NACL 20-0.9 MG/50ML-% IV SOLN
20.0000 mg | Freq: Once | INTRAVENOUS | Status: AC
  Administered 2022-09-11: 20 mg via INTRAVENOUS
  Filled 2022-09-11: qty 50

## 2022-09-11 MED ORDER — DEXTROSE 5 % IV SOLN
70.0000 mg/m2 | Freq: Once | INTRAVENOUS | Status: AC
  Administered 2022-09-11: 140 mg via INTRAVENOUS
  Filled 2022-09-11: qty 60

## 2022-09-11 MED ORDER — PALONOSETRON HCL INJECTION 0.25 MG/5ML
0.2500 mg | Freq: Once | INTRAVENOUS | Status: AC
  Administered 2022-09-11: 0.25 mg via INTRAVENOUS
  Filled 2022-09-11: qty 5

## 2022-09-11 MED ORDER — PALONOSETRON HCL INJECTION 0.25 MG/5ML: 0.2500 mg | Freq: Once | INTRAVENOUS | Status: DC

## 2022-09-11 MED ORDER — SODIUM CHLORIDE 0.9 % IV SOLN
20.0000 mg | Freq: Once | INTRAVENOUS | Status: AC
  Administered 2022-09-11: 20 mg via INTRAVENOUS
  Filled 2022-09-11: qty 20

## 2022-09-11 MED ORDER — DIPHENHYDRAMINE HCL 25 MG PO CAPS
50.0000 mg | ORAL_CAPSULE | Freq: Once | ORAL | Status: AC
  Administered 2022-09-11: 50 mg via ORAL
  Filled 2022-09-11: qty 2

## 2022-09-11 NOTE — Patient Instructions (Signed)
[facility]  Discharge Instructions: Thank you for choosing [facility] to provide your oncology and hematology care.   If you have a lab appointment with the Cancer Center, please go directly to the Cancer Center and check in at the registration area.   Wear comfortable clothing and clothing appropriate for easy access to any Portacath or PICC line.   We strive to give you quality time with your provider. You may need to reschedule your appointment if you arrive late (15 or more minutes).  Arriving late affects you and other patients whose appointments are after yours.  Also, if you miss three or more appointments without notifying the office, you may be dismissed from the clinic at the provider's discretion.      For prescription refill requests, have your pharmacy contact our office and allow 72 hours for refills to be completed.    Today you received the following chemotherapy and/or immunotherapy agents: Daratumumab, Kyprolis.       To help prevent nausea and vomiting after your treatment, we encourage you to take your nausea medication as directed.  BELOW ARE SYMPTOMS THAT SHOULD BE REPORTED IMMEDIATELY: *FEVER GREATER THAN 100.4 F (38 C) OR HIGHER *CHILLS OR SWEATING *NAUSEA AND VOMITING THAT IS NOT CONTROLLED WITH YOUR NAUSEA MEDICATION *UNUSUAL SHORTNESS OF BREATH *UNUSUAL BRUISING OR BLEEDING *URINARY PROBLEMS (pain or burning when urinating, or frequent urination) *BOWEL PROBLEMS (unusual diarrhea, constipation, pain near the anus) TENDERNESS IN MOUTH AND THROAT WITH OR WITHOUT PRESENCE OF ULCERS (sore throat, sores in mouth, or a toothache) UNUSUAL RASH, SWELLING OR PAIN  UNUSUAL VAGINAL DISCHARGE OR ITCHING   Items with * indicate a potential emergency and should be followed up as soon as possible or go to the Emergency Department if any problems should occur.  Please show the CHEMOTHERAPY ALERT CARD or IMMUNOTHERAPY ALERT  CARD at check-in to the Emergency Department and triage nurse.  Should you have questions after your visit or need to cancel or reschedule your appointment, please contact [facility]  Dept: [phone]  and follow the prompts.  Office hours are 8:00 a.m. to 4:30 p.m. Monday - Friday. Please note that voicemails left after 4:00 p.m. may not be returned until the following business day.  We are closed weekends and major holidays. You have access to a nurse at all times for urgent questions. Please call the main number to the clinic Dept: [phone] and follow the prompts.   For any non-urgent questions, you may also contact your provider using MyChart. We now offer e-Visits for anyone 18 and older to request care online for non-urgent symptoms. For details visit [email or web address].   Also download the MyChart app! Go to the app store, search "MyChart", open the app, select [employer], and log in with your MyChart username and password. 

## 2022-09-11 NOTE — Progress Notes (Signed)
Per pt preference, Daratumumab ran as a subsequent infusion today.

## 2022-09-12 ENCOUNTER — Other Ambulatory Visit: Payer: Self-pay | Admitting: Hematology

## 2022-09-12 DIAGNOSIS — C9000 Multiple myeloma not having achieved remission: Secondary | ICD-10-CM

## 2022-09-21 ENCOUNTER — Other Ambulatory Visit: Payer: Self-pay | Admitting: Hematology

## 2022-09-21 DIAGNOSIS — C9000 Multiple myeloma not having achieved remission: Secondary | ICD-10-CM

## 2022-09-23 ENCOUNTER — Other Ambulatory Visit: Payer: Self-pay

## 2022-09-23 DIAGNOSIS — C9000 Multiple myeloma not having achieved remission: Secondary | ICD-10-CM

## 2022-09-23 MED ORDER — OXYCODONE HCL 5 MG PO TABS: 5.0000 mg | ORAL_TABLET | ORAL | 0 refills | Status: DC | PRN

## 2022-09-23 MED ORDER — METHOCARBAMOL 500 MG PO TABS: 500.0000 mg | ORAL_TABLET | Freq: Three times a day (TID) | ORAL | 0 refills | Status: DC | PRN

## 2022-09-25 ENCOUNTER — Inpatient Hospital Stay: Payer: BC Managed Care – PPO

## 2022-09-25 ENCOUNTER — Inpatient Hospital Stay: Payer: BC Managed Care – PPO | Attending: Physician Assistant | Admitting: Hematology

## 2022-09-25 VITALS — BP 132/82 | HR 81 | Resp 18

## 2022-09-25 VITALS — BP 152/98 | HR 73 | Temp 97.2°F | Resp 20 | Wt 199.4 lb

## 2022-09-25 DIAGNOSIS — C9000 Multiple myeloma not having achieved remission: Secondary | ICD-10-CM

## 2022-09-25 DIAGNOSIS — D72819 Decreased white blood cell count, unspecified: Secondary | ICD-10-CM | POA: Diagnosis not present

## 2022-09-25 DIAGNOSIS — Z8616 Personal history of COVID-19: Secondary | ICD-10-CM | POA: Insufficient documentation

## 2022-09-25 DIAGNOSIS — I700 Atherosclerosis of aorta: Secondary | ICD-10-CM | POA: Insufficient documentation

## 2022-09-25 DIAGNOSIS — Z87891 Personal history of nicotine dependence: Secondary | ICD-10-CM | POA: Insufficient documentation

## 2022-09-25 DIAGNOSIS — M545 Low back pain, unspecified: Secondary | ICD-10-CM | POA: Diagnosis not present

## 2022-09-25 DIAGNOSIS — Z7961 Long term (current) use of immunomodulator: Secondary | ICD-10-CM | POA: Insufficient documentation

## 2022-09-25 DIAGNOSIS — D3501 Benign neoplasm of right adrenal gland: Secondary | ICD-10-CM | POA: Insufficient documentation

## 2022-09-25 DIAGNOSIS — R059 Cough, unspecified: Secondary | ICD-10-CM | POA: Diagnosis not present

## 2022-09-25 DIAGNOSIS — M25511 Pain in right shoulder: Secondary | ICD-10-CM | POA: Insufficient documentation

## 2022-09-25 DIAGNOSIS — Z79891 Long term (current) use of opiate analgesic: Secondary | ICD-10-CM | POA: Diagnosis not present

## 2022-09-25 DIAGNOSIS — R0602 Shortness of breath: Secondary | ICD-10-CM | POA: Diagnosis not present

## 2022-09-25 DIAGNOSIS — R21 Rash and other nonspecific skin eruption: Secondary | ICD-10-CM | POA: Insufficient documentation

## 2022-09-25 DIAGNOSIS — R110 Nausea: Secondary | ICD-10-CM | POA: Diagnosis not present

## 2022-09-25 DIAGNOSIS — Z5112 Encounter for antineoplastic immunotherapy: Secondary | ICD-10-CM | POA: Insufficient documentation

## 2022-09-25 DIAGNOSIS — R5383 Other fatigue: Secondary | ICD-10-CM | POA: Insufficient documentation

## 2022-09-25 DIAGNOSIS — Z7189 Other specified counseling: Secondary | ICD-10-CM

## 2022-09-25 LAB — CBC WITH DIFFERENTIAL (CANCER CENTER ONLY)
Abs Immature Granulocytes: 0.02 10*3/uL (ref 0.00–0.07)
Basophils Absolute: 0.1 10*3/uL (ref 0.0–0.1)
Basophils Relative: 2 %
Eosinophils Absolute: 0.3 10*3/uL (ref 0.0–0.5)
Eosinophils Relative: 10 %
HCT: 43.2 % (ref 39.0–52.0)
Hemoglobin: 14.8 g/dL (ref 13.0–17.0)
Immature Granulocytes: 1 %
Lymphocytes Relative: 26 %
Lymphs Abs: 0.8 10*3/uL (ref 0.7–4.0)
MCH: 29.7 pg (ref 26.0–34.0)
MCHC: 34.3 g/dL (ref 30.0–36.0)
MCV: 86.6 fL (ref 80.0–100.0)
Monocytes Absolute: 0.3 10*3/uL (ref 0.1–1.0)
Monocytes Relative: 10 %
Neutro Abs: 1.7 10*3/uL (ref 1.7–7.7)
Neutrophils Relative %: 51 %
Platelet Count: 213 10*3/uL (ref 150–400)
RBC: 4.99 MIL/uL (ref 4.22–5.81)
RDW: 14.4 % (ref 11.5–15.5)
WBC Count: 3.2 10*3/uL — ABNORMAL LOW (ref 4.0–10.5)
nRBC: 0 % (ref 0.0–0.2)

## 2022-09-25 LAB — CMP (CANCER CENTER ONLY)
ALT: 16 U/L (ref 0–44)
AST: 14 U/L — ABNORMAL LOW (ref 15–41)
Albumin: 4 g/dL (ref 3.5–5.0)
Alkaline Phosphatase: 57 U/L (ref 38–126)
Anion gap: 6 (ref 5–15)
BUN: 14 mg/dL (ref 8–23)
CO2: 26 mmol/L (ref 22–32)
Calcium: 9.5 mg/dL (ref 8.9–10.3)
Chloride: 107 mmol/L (ref 98–111)
Creatinine: 0.75 mg/dL (ref 0.61–1.24)
GFR, Estimated: >60 mL/min (ref >60)
Glucose, Bld: 97 mg/dL (ref 70–99)
Potassium: 4.4 mmol/L (ref 3.5–5.1)
Sodium: 139 mmol/L (ref 135–145)
Total Bilirubin: 0.6 mg/dL (ref 0.3–1.2)
Total Protein: 5.7 g/dL — ABNORMAL LOW (ref 6.5–8.1)

## 2022-09-25 MED ORDER — FAMOTIDINE IN NACL 20-0.9 MG/50ML-% IV SOLN
20.0000 mg | Freq: Once | INTRAVENOUS | Status: AC
  Administered 2022-09-25: 20 mg via INTRAVENOUS
  Filled 2022-09-25: qty 50

## 2022-09-25 MED ORDER — DIPHENHYDRAMINE HCL 25 MG PO CAPS
50.0000 mg | ORAL_CAPSULE | Freq: Once | ORAL | Status: AC
  Administered 2022-09-25: 50 mg via ORAL
  Filled 2022-09-25: qty 2

## 2022-09-25 MED ORDER — SODIUM CHLORIDE 0.9 % IV SOLN: Freq: Once | INTRAVENOUS | Status: AC

## 2022-09-25 MED ORDER — DEXTROSE 5 % IV SOLN
70.0000 mg/m2 | Freq: Once | INTRAVENOUS | Status: AC
  Administered 2022-09-25: 140 mg via INTRAVENOUS
  Filled 2022-09-25: qty 60

## 2022-09-25 MED ORDER — ACETAMINOPHEN 325 MG PO TABS
650.0000 mg | ORAL_TABLET | Freq: Once | ORAL | Status: AC
  Administered 2022-09-25: 650 mg via ORAL
  Filled 2022-09-25: qty 2

## 2022-09-25 MED ORDER — SODIUM CHLORIDE 0.9 % IV SOLN
12.0000 mg | Freq: Once | INTRAVENOUS | Status: AC
  Administered 2022-09-25: 12 mg via INTRAVENOUS
  Filled 2022-09-25: qty 1.2

## 2022-09-25 MED ORDER — SODIUM CHLORIDE 0.9 % IV SOLN
16.0000 mg/kg | Freq: Once | INTRAVENOUS | Status: AC
  Administered 2022-09-25: 1400 mg via INTRAVENOUS
  Filled 2022-09-25: qty 10

## 2022-09-25 MED ORDER — PALONOSETRON HCL INJECTION 0.25 MG/5ML
0.2500 mg | Freq: Once | INTRAVENOUS | Status: AC
  Administered 2022-09-25: 0.25 mg via INTRAVENOUS
  Filled 2022-09-25: qty 5

## 2022-09-25 NOTE — Patient Instructions (Signed)
[facility]  Discharge Instructions: Thank you for choosing [facility] to provide your oncology and hematology care.   If you have a lab appointment with the Cancer Center, please go directly to the Cancer Center and check in at the registration area.   Wear comfortable clothing and clothing appropriate for easy access to any Portacath or PICC line.   We strive to give you quality time with your provider. You may need to reschedule your appointment if you arrive late (15 or more minutes).  Arriving late affects you and other patients whose appointments are after yours.  Also, if you miss three or more appointments without notifying the office, you may be dismissed from the clinic at the provider's discretion.      For prescription refill requests, have your pharmacy contact our office and allow 72 hours for refills to be completed.    Today you received the following chemotherapy and/or immunotherapy agents: Daratumumab, Kyprolis.       To help prevent nausea and vomiting after your treatment, we encourage you to take your nausea medication as directed.  BELOW ARE SYMPTOMS THAT SHOULD BE REPORTED IMMEDIATELY: *FEVER GREATER THAN 100.4 F (38 C) OR HIGHER *CHILLS OR SWEATING *NAUSEA AND VOMITING THAT IS NOT CONTROLLED WITH YOUR NAUSEA MEDICATION *UNUSUAL SHORTNESS OF BREATH *UNUSUAL BRUISING OR BLEEDING *URINARY PROBLEMS (pain or burning when urinating, or frequent urination) *BOWEL PROBLEMS (unusual diarrhea, constipation, pain near the anus) TENDERNESS IN MOUTH AND THROAT WITH OR WITHOUT PRESENCE OF ULCERS (sore throat, sores in mouth, or a toothache) UNUSUAL RASH, SWELLING OR PAIN  UNUSUAL VAGINAL DISCHARGE OR ITCHING   Items with * indicate a potential emergency and should be followed up as soon as possible or go to the Emergency Department if any problems should occur.  Please show the CHEMOTHERAPY ALERT CARD or IMMUNOTHERAPY ALERT  CARD at check-in to the Emergency Department and triage nurse.  Should you have questions after your visit or need to cancel or reschedule your appointment, please contact [facility]  Dept: [phone]  and follow the prompts.  Office hours are 8:00 a.m. to 4:30 p.m. Monday - Friday. Please note that voicemails left after 4:00 p.m. may not be returned until the following business day.  We are closed weekends and major holidays. You have access to a nurse at all times for urgent questions. Please call the main number to the clinic Dept: [phone] and follow the prompts.   For any non-urgent questions, you may also contact your provider using MyChart. We now offer e-Visits for anyone 18 and older to request care online for non-urgent symptoms. For details visit [email or web address].   Also download the MyChart app! Go to the app store, search "MyChart", open the app, select [facility], and log in with your MyChart username and password. 

## 2022-09-25 NOTE — Progress Notes (Signed)
Patient seen by MD today  Vitals are within treatment parameters.  Labs reviewed: and are within treatment parameters."  Per physician team, patient is ready for treatment and there are NO modifications to the treatment plan.

## 2022-09-25 NOTE — Progress Notes (Signed)
HEMATOLOGY/ONCOLOGY CLINIC NOTE  Date of Service: 09/25/22   Patient Care Team: Lennie Odor, [place] as PCP - General (Nurse Practitioner)  CHIEF COMPLAINTS/PURPOSE OF CONSULTATION:  Follow-up for continued evaluation and management of multiple myeloma  HISTORY OF PRESENTING ILLNESS:   Anthony [name] is a wonderful 62 y.o. male who has been referred to [place] by Dr Lennie Odor, PA for evaluation and management of newly diagnosed multiple myeloma. He reports He is doing well.  He reports persistent lower back pain that he has had since he was in his 20's. He notes no previous significant injuries. He further notes that he gets chiropractic adjustments and takes Tylenol to manage his symptoms. He notes less back pain when standing up on his right leg and maintaining weight on his right leg.  He reports previous history of smoking and chewing tobacco over 40 years ago.  He had a recent fall back in February of this year with only a pulled muscle. And he notes another fall when chasing his cat where he fell on his right shoulder. He reports pain in right shoulder.   He had a recent COVID-19 infection back in March.  He reports intermittent cough with SOB. He notes he takes 2 Rolaids at night. We discussed potentially trying antacids which he is agreeable to as he will begin steroids soon and was advised of the possible symptoms and side effects from taking steroids.  We discussed CRAB criteria and that he meets at least two of the criterion being  anemia and bone disease.  We discussed getting additional scans for further evaluation which he is agreeable to.  We further discussed starting Daratumumab/Velcade/Dexamethasone/Zometa and Revlimid for treatment which he is agreeable to. We also discussed starting steroids before treatment and taking an acid suppressant which he was also agreeable to.  We discussed getting Senna and taking it as needed for constipation.  Labs done today  were reviewed in detail.  We discussed his recent bone marrow biopsy and aspiration done 02/06/2022.  We discussed PET/CT scan done 01/28/2022.  We discussed CT right shoulder w/o contrast done 11/13/2021  INTERVAL HISTORY:  Anthony [name] is a 62 y.o. male, is here for continued evaluation of and management of multiple myeloma. He is here to start cycle 7 day 1 of his treatment.   Patient was last seen by me on 08/14/2022 and he was doing well overall except grade 1 fatigue and grade 1 nausea. Patient is accompanied by his wife during this visit.   Patient reports he has been doing well overall without any new medical concerns since our last visit. Patient does complain of activity related back pain. He has been taking oxycodone daily which has been helping his back pain. Patient notes that his pain worsens when he takes his fentanyl patches off.   He notes that he has been sleeping better since our last visit. However, he does report of waking up tired. He has been napping during the day.   He notes he has been tolerating well without any severe toxicities.   Patient notes he is planning on going to [place] in April or May.  Patient complains of skin rash near right hand. He denies of getting beaten from insect or being in the woods. Patient had skin infection near his right leg around 5 years ago.   He denies fever, chills, night sweats, back pain, abdominal pain, chest pain, or leg swelling.   MEDICAL HISTORY:  Past Medical History:  Diagnosis Date   Back pain    Cancer ([place])    Multiple myeloma ([place]) 02/15/2022    SURGICAL HISTORY: Past Surgical History:  Procedure Laterality Date   IR BONE TUMOR(S)RF ABLATION  05/07/2022   IR BONE TUMOR(S)RF ABLATION  05/07/2022   IR BONE TUMOR(S)RF ABLATION  05/07/2022   IR KYPHO EA ADDL LEVEL THORACIC OR LUMBAR  05/07/2022   IR KYPHO LUMBAR INC FX REDUCE BONE BX UNI/BIL CANNULATION INC/IMAGING  05/07/2022   IR KYPHO THORACIC WITH BONE  BIOPSY  05/07/2022   IR RADIOLOGIST EVAL & MGMT  04/23/2022   IR RADIOLOGIST EVAL & MGMT  05/17/2022   IR RADIOLOGIST EVAL & MGMT  06/06/2022    SOCIAL HISTORY: Social History   Socioeconomic History   Marital status: Married    Spouse name: Not on file   Number of children: Not on file   Years of education: Not on file   Highest education level: Not on file  Occupational History   Not on file  Tobacco Use   Smoking status: Never   Smokeless tobacco: Former    Types: Chew, Snuff  Vaping Use   Vaping Use: Never used  Substance and Sexual Activity   Alcohol use: Yes    Alcohol/week: 2.0 standard drinks of alcohol    Types: 2 Glasses of wine per week   Drug use: No   Sexual activity: Yes  Other Topics Concern   Not on file  Social History Narrative   Not on file   Social Determinants of Health   Financial Resource Strain: Low Risk  (05/07/2022)   Overall Financial Resource Strain (CARDIA)    Difficulty of Paying Living Expenses: Not very hard  Food Insecurity: No Food Insecurity (05/07/2022)   Hunger Vital Sign    Worried About Running Out of Food in the Last Year: Never true    Ran Out of Food in the Last Year: Never true  Transportation Needs: No Transportation Needs (05/07/2022)   PRAPARE - Hydrologist (Medical): No    Lack of Transportation (Non-Medical): No  Physical Activity: Not on file  Stress: Not on file  Social Connections: Not on file  Intimate Partner Violence: Unknown (05/07/2022)   Humiliation, Afraid, Rape, and Kick questionnaire    Fear of Current or Ex-Partner: No    Emotionally Abused: No    Physically Abused: No    Sexually Abused: Not on file    FAMILY HISTORY: Family History  Problem Relation Age of Onset   Rheum arthritis Mother    Other Sister        Pre-cancerous uterine mass;    Rheum arthritis Sister    Bone cancer Paternal Grandfather    Brain cancer Maternal Uncle    Brain cancer Maternal Aunt     Osteoporosis Neg Hx     ALLERGIES:  has No Known Allergies.  MEDICATIONS:  Current Outpatient Medications  Medication Sig Dispense Refill   acyclovir (ZOVIRAX) 400 MG tablet Take 1 tablet (400 mg total) by mouth 2 (two) times daily. 60 tablet 11   aluminum chloride (DRYSOL) 20 % external solution Apply 1 application topically at bedtime.     apixaban (ELIQUIS) 5 MG TABS tablet Take 1 tablet (5 mg total) by mouth 2 (two) times daily. 60 tablet 5   calcium-vitamin D (OSCAL WITH D) 250-125 MG-UNIT tablet Take 1 tablet by mouth daily.     cyclobenzaprine (FLEXERIL) 10 MG tablet Take 10 mg by  mouth 3 (three) times daily as needed for muscle spasms. (Patient not taking: Reported on 05/21/2022)     diclofenac sodium (VOLTAREN) 1 % GEL Apply 2 g topically as needed.     dronabinol (MARINOL) 5 MG capsule Take 1 capsule (5 mg total) by mouth 2 (two) times daily before a meal. 60 capsule 0   ergocalciferol (VITAMIN D2) 1.25 MG (50000 UT) capsule Take 1 capsule (50,000 Units total) by mouth 2 (two) times daily. 12 capsule 2   esomeprazole (NEXIUM) 40 MG capsule Take 1 capsule (40 mg total) by mouth daily before breakfast. 30 capsule 1   fentaNYL (DURAGESIC) 25 MCG/HR Place 1 patch onto the skin every 3 (three) days. 10 patch 0   furosemide (LASIX) 20 MG tablet Take 1 tablet (20 mg total) by mouth daily. 30 tablet 1   lidocaine (LIDODERM) 5 % Place 1 patch onto the skin daily. Remove & Discard patch within 12 hours or as directed by MD 30 patch 0   methocarbamol (ROBAXIN) 500 MG tablet Take 1 tablet (500 mg total) by mouth every 8 (eight) hours as needed for muscle spasms. 30 tablet 0   methylPREDNISolone (MEDROL DOSEPAK) 4 MG TBPK tablet Take 6 pills by mouth day 1, 5 on day 2, 4 on day 3, 3 on day 4, 2 on day 5, 1 on day 6 21 tablet 0   Multiple Vitamin (MULTIVITAMIN) tablet Take 1 tablet by mouth daily.     Omega-3 Fatty Acids (FISH OIL) 1360 MG CAPS Take 1 capsule by mouth daily.     ondansetron  (ZOFRAN) 8 MG tablet Take 1 tablet (8 mg total) by mouth every 8 (eight) hours as needed for nausea or vomiting. 30 tablet 0   oxyCODONE (ROXICODONE) 5 MG immediate release tablet Take 1 tablet (5 mg total) by mouth every 4 (four) hours as needed for severe pain or moderate pain. Take 1 to 2 tablets every 4 hours as needed for moderate to severe pain. 60 tablet 0   REVLIMID 15 MG capsule TAKE 1 CAPSULE (15 mg) DAILY FOR 21 DAYS ON THEN 7 DAYS OFF 21 capsule 0   senna-docusate (SENNA S) 8.6-50 MG tablet Take 2 tablets by mouth at bedtime. 60 tablet 1   terbinafine (LAMISIL) 1 % cream Apply 1 application topically 2 (two) times daily.     No current facility-administered medications for this visit.    10 Point review of Systems was done is negative except as noted above.  PHYSICAL EXAMINATION: .BP (!) 152/98   Pulse 73   Temp (!) 97.2 F (36.2 C)   Resp 20   Wt 199 lb 6.4 oz (90.4 kg)   SpO2 99%   BMI 31.23 kg/m  NAD GENERAL:alert, in no acute distress and comfortable SKIN: no acute rashes, no significant lesions EYES: conjunctiva are pink and non-injected, sclera anicteric OROPHARYNX: MMM, no exudates, no oropharyngeal erythema or ulceration NECK: supple, no JVD LYMPH:  no palpable lymphadenopathy in the cervical, axillary or inguinal regions LUNGS: clear to auscultation b/l with normal respiratory effort HEART: regular rate & rhythm ABDOMEN:  normoactive bowel sounds , non tender, not distended. Extremity: no pedal edema PSYCH: alert & oriented x 3 with fluent speech NEURO: no focal motor/sensory deficits   LABORATORY DATA:  I have reviewed the data as listed .    Latest Ref Rng & Units 09/25/2022   10:25 AM 09/11/2022    9:18 AM 09/04/2022   12:42 PM  CBC  WBC  4.0 - 10.5 K/uL 3.2  5.0  6.9   Hemoglobin 13.0 - 17.0 g/dL 14.8  15.5  15.4   Hematocrit 39.0 - 52.0 % 43.2  45.3  44.7   Platelets 150 - 400 K/uL 213  112  143       Latest Ref Rng & Units 09/25/2022   10:25 AM  09/11/2022    9:18 AM 09/04/2022   12:42 PM  CMP  Glucose 70 - 99 mg/dL 97  83  129   BUN 8 - 23 mg/dL 14  16  15   $ Creatinine 0.61 - 1.24 mg/dL 0.75  0.89  0.86   Sodium 135 - 145 mmol/L 139  138  133   Potassium 3.5 - 5.1 mmol/L 4.4  4.1  3.9   Chloride 98 - 111 mmol/L 107  105  103   CO2 22 - 32 mmol/L 26  26  23   $ Calcium 8.9 - 10.3 mg/dL 9.5  9.5  9.0   Total Protein 6.5 - 8.1 g/dL 5.7  6.1  6.0   Total Bilirubin 0.3 - 1.2 mg/dL 0.6  0.6  0.7   Alkaline Phos 38 - 126 U/L 57  64  50   AST 15 - 41 U/L 14  12  13   $ ALT 0 - 44 U/L 16  15  15     $ PATHOLOGY Surgical Pathology CASE: [id number] PATIENT: Billie Currin Bone Marrow Report     Clinical History: Multiple Myeloma     DIAGNOSIS:  BONE MARROW, ASPIRATE, CLOT, CORE: -Hypercellular bone marrow (60%) with erythroid predominant trilineage hematopoiesis and no evidence of residual plasma cell neoplasm (less than 1% plasma cells by manual aspirate differential, and CD138 immunohistochemical analysis of clot and core sections).  PERIPHERAL BLOOD: -Leukopenia  MICROSCOPIC DESCRIPTION:  PERIPHERAL BLOOD SMEAR: Platelets: Adequate in number, no platelet clumps identified Erythroid: Normocytic normochromic red blood cells Leukocytes: Mild leukopenia, negative for dysplastic granulocytes, blasts or  plasma cells  BONE MARROW ASPIRATE: Cellular Erythroid precursors: Relatively increased, show a full sequence of maturation with occasional abnormal forms (including binucleate forms, megaloblastoid change and nuclear membrane irregularities) Granulocytic precursors: Decreased, show a full sequence of generally orderly maturation Megakaryocytes: Normal in number and morphology Lymphocytes/plasma cells: Not increased   02/06/2022 Molecular pathology   RADIOGRAPHIC STUDIES: I have personally reviewed the radiological images as listed and agreed with the findings in the report. No results found.   ASSESSMENT &  PLAN:   62 y.o. very pleasant male with  1. R-ISS stage III multiple myeloma with extensive bone metastases and patholgiic fracture rt shoulder -Recent bone marrow biopsy and aspiration done 02/06/2022 revealed hypercellular bone marrow with plasma cell neoplasm. 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  Plan: -Discussed lab results from today, 09/25/2022, with the patient. CBC shows slightly decreased WBC of 3.2. CMP is stable -Patient has been tolerating his treatment well without any toxicities.  -Continue cycle 7 of [place]ara/Carfilzomib/Rev/Dex today without any dose modifications. Except cut down pre-treatment Dexamethasone to 12 mg.  -Reduce the post-dexamethasone to 4 mg.  -Answered all of patient's questions.  -switching to maintenance Carfilzomib every 2 weeks and maintenance Revlimid from 10/23/2022. Treatment orders adjusted.  FOLLOW-UP: Change to Maintenance Carfilzomib every 2 weeks from 10/23/2022 -portflush and labs with each treatment MD visit 3/6   The total time spent in the appointment was 32 minutes* .  All of the patient's questions were answered with apparent satisfaction. The patient knows to  call the clinic with any problems, questions or concerns.   Sullivan Lone MD MS AAHIVMS [facility] Hematology/Oncology Physician [facility]  .*Total Encounter Time as defined by the Centers for Medicare and Medicaid Services includes, in addition to the face-to-face time of a patient visit (documented in the note above) non-face-to-face time: obtaining and reviewing outside history, ordering and reviewing medications, tests or procedures, care coordination (communications with other health care professionals or caregivers) and documentation in the medical record.   I, Cleda Mccreedy, am acting as a Education administrator for Sullivan Lone, MD. .I have reviewed the above documentation for accuracy and completeness, and I agree with the  above. Brunetta Genera MD

## 2022-09-27 ENCOUNTER — Other Ambulatory Visit: Payer: Self-pay | Admitting: Hematology

## 2022-09-27 DIAGNOSIS — C9000 Multiple myeloma not having achieved remission: Secondary | ICD-10-CM

## 2022-09-30 ENCOUNTER — Other Ambulatory Visit: Payer: Self-pay

## 2022-10-01 DIAGNOSIS — C9000 Multiple myeloma not having achieved remission: Secondary | ICD-10-CM | POA: Diagnosis not present

## 2022-10-02 ENCOUNTER — Encounter: Payer: Self-pay | Admitting: Hematology

## 2022-10-02 ENCOUNTER — Inpatient Hospital Stay: Payer: BC Managed Care – PPO

## 2022-10-02 VITALS — BP 137/86 | HR 78 | Temp 98.9°F | Resp 18 | Wt 198.5 lb

## 2022-10-02 DIAGNOSIS — M25511 Pain in right shoulder: Secondary | ICD-10-CM | POA: Diagnosis not present

## 2022-10-02 DIAGNOSIS — I700 Atherosclerosis of aorta: Secondary | ICD-10-CM | POA: Diagnosis not present

## 2022-10-02 DIAGNOSIS — R0602 Shortness of breath: Secondary | ICD-10-CM | POA: Diagnosis not present

## 2022-10-02 DIAGNOSIS — Z87891 Personal history of nicotine dependence: Secondary | ICD-10-CM | POA: Diagnosis not present

## 2022-10-02 DIAGNOSIS — Z7189 Other specified counseling: Secondary | ICD-10-CM

## 2022-10-02 DIAGNOSIS — R110 Nausea: Secondary | ICD-10-CM | POA: Diagnosis not present

## 2022-10-02 DIAGNOSIS — R5383 Other fatigue: Secondary | ICD-10-CM | POA: Diagnosis not present

## 2022-10-02 DIAGNOSIS — Z79891 Long term (current) use of opiate analgesic: Secondary | ICD-10-CM | POA: Diagnosis not present

## 2022-10-02 DIAGNOSIS — C9000 Multiple myeloma not having achieved remission: Secondary | ICD-10-CM

## 2022-10-02 DIAGNOSIS — R21 Rash and other nonspecific skin eruption: Secondary | ICD-10-CM | POA: Diagnosis not present

## 2022-10-02 DIAGNOSIS — Z5112 Encounter for antineoplastic immunotherapy: Secondary | ICD-10-CM | POA: Diagnosis not present

## 2022-10-02 DIAGNOSIS — Z7961 Long term (current) use of immunomodulator: Secondary | ICD-10-CM | POA: Diagnosis not present

## 2022-10-02 DIAGNOSIS — Z8616 Personal history of COVID-19: Secondary | ICD-10-CM | POA: Diagnosis not present

## 2022-10-02 DIAGNOSIS — D72819 Decreased white blood cell count, unspecified: Secondary | ICD-10-CM | POA: Diagnosis not present

## 2022-10-02 DIAGNOSIS — R059 Cough, unspecified: Secondary | ICD-10-CM | POA: Diagnosis not present

## 2022-10-02 DIAGNOSIS — M545 Low back pain, unspecified: Secondary | ICD-10-CM | POA: Diagnosis not present

## 2022-10-02 DIAGNOSIS — D3501 Benign neoplasm of right adrenal gland: Secondary | ICD-10-CM | POA: Diagnosis not present

## 2022-10-02 LAB — MULTIPLE MYELOMA PANEL, SERUM
Albumin SerPl Elph-Mcnc: 3.7 g/dL (ref 2.9–4.4)
Albumin/Glob SerPl: 2 — ABNORMAL HIGH (ref 0.7–1.7)
Alpha 1: 0.2 g/dL (ref 0.0–0.4)
Alpha2 Glob SerPl Elph-Mcnc: 0.5 g/dL (ref 0.4–1.0)
B-Globulin SerPl Elph-Mcnc: 0.8 g/dL (ref 0.7–1.3)
Gamma Glob SerPl Elph-Mcnc: 0.3 g/dL — ABNORMAL LOW (ref 0.4–1.8)
Globulin, Total: 1.9 g/dL — ABNORMAL LOW (ref 2.2–3.9)
IgA: 9 mg/dL — ABNORMAL LOW (ref 61–437)
IgG (Immunoglobin G), Serum: 405 mg/dL — ABNORMAL LOW (ref 603–1613)
IgM (Immunoglobulin M), Srm: <5 mg/dL — ABNORMAL LOW (ref 20–172)
M Protein SerPl Elph-Mcnc: 0.1 g/dL — ABNORMAL HIGH
Total Protein ELP: 5.6 g/dL — ABNORMAL LOW (ref 6.0–8.5)

## 2022-10-02 LAB — CBC WITH DIFFERENTIAL (CANCER CENTER ONLY)
Abs Immature Granulocytes: 0.02 10*3/uL (ref 0.00–0.07)
Basophils Absolute: 0 10*3/uL (ref 0.0–0.1)
Basophils Relative: 0 %
Eosinophils Absolute: 0.4 10*3/uL (ref 0.0–0.5)
Eosinophils Relative: 7 %
HCT: 45.1 % (ref 39.0–52.0)
Hemoglobin: 15.6 g/dL (ref 13.0–17.0)
Immature Granulocytes: 0 %
Lymphocytes Relative: 23 %
Lymphs Abs: 1.2 10*3/uL (ref 0.7–4.0)
MCH: 29.5 pg (ref 26.0–34.0)
MCHC: 34.6 g/dL (ref 30.0–36.0)
MCV: 85.3 fL (ref 80.0–100.0)
Monocytes Absolute: 1 10*3/uL (ref 0.1–1.0)
Monocytes Relative: 19 %
Neutro Abs: 2.7 10*3/uL (ref 1.7–7.7)
Neutrophils Relative %: 51 %
Platelet Count: 119 10*3/uL — ABNORMAL LOW (ref 150–400)
RBC: 5.29 MIL/uL (ref 4.22–5.81)
RDW: 14.3 % (ref 11.5–15.5)
WBC Count: 5.3 10*3/uL (ref 4.0–10.5)
nRBC: 0 % (ref 0.0–0.2)

## 2022-10-02 LAB — CMP (CANCER CENTER ONLY)
ALT: 14 U/L (ref 0–44)
AST: 12 U/L — ABNORMAL LOW (ref 15–41)
Albumin: 4.2 g/dL (ref 3.5–5.0)
Alkaline Phosphatase: 58 U/L (ref 38–126)
Anion gap: 5 (ref 5–15)
BUN: 16 mg/dL (ref 8–23)
CO2: 26 mmol/L (ref 22–32)
Calcium: 8.9 mg/dL (ref 8.9–10.3)
Chloride: 106 mmol/L (ref 98–111)
Creatinine: 0.84 mg/dL (ref 0.61–1.24)
GFR, Estimated: >60 mL/min (ref >60)
Glucose, Bld: 126 mg/dL — ABNORMAL HIGH (ref 70–99)
Potassium: 3.9 mmol/L (ref 3.5–5.1)
Sodium: 137 mmol/L (ref 135–145)
Total Bilirubin: 0.9 mg/dL (ref 0.3–1.2)
Total Protein: 6 g/dL — ABNORMAL LOW (ref 6.5–8.1)

## 2022-10-02 MED ORDER — SODIUM CHLORIDE 0.9 % IV SOLN: Freq: Once | INTRAVENOUS | Status: AC

## 2022-10-02 MED ORDER — PALONOSETRON HCL INJECTION 0.25 MG/5ML
0.2500 mg | Freq: Once | INTRAVENOUS | Status: AC
  Administered 2022-10-02: 0.25 mg via INTRAVENOUS
  Filled 2022-10-02: qty 5

## 2022-10-02 MED ORDER — SODIUM CHLORIDE 0.9 % IV SOLN: 8.0000 mg | Freq: Once | INTRAVENOUS | Status: DC

## 2022-10-02 MED ORDER — ZOLEDRONIC ACID 4 MG/100ML IV SOLN
4.0000 mg | Freq: Once | INTRAVENOUS | Status: AC
  Administered 2022-10-02: 4 mg via INTRAVENOUS
  Filled 2022-10-02: qty 100

## 2022-10-02 MED ORDER — DEXAMETHASONE SODIUM PHOSPHATE 10 MG/ML IJ SOLN
8.0000 mg | Freq: Once | INTRAMUSCULAR | Status: AC
  Administered 2022-10-02: 8 mg via INTRAVENOUS
  Filled 2022-10-02: qty 1

## 2022-10-02 MED ORDER — DEXTROSE 5 % IV SOLN
70.0000 mg/m2 | Freq: Once | INTRAVENOUS | Status: AC
  Administered 2022-10-02: 140 mg via INTRAVENOUS
  Filled 2022-10-02: qty 60

## 2022-10-02 NOTE — Patient Instructions (Addendum)
[facility]  Discharge Instructions: Thank you for choosing [facility] to provide your oncology and hematology care.   If you have a lab appointment with the [facility], please go directly to the [facility] and check in at the registration area.   Wear comfortable clothing and clothing appropriate for easy access to any Portacath or PICC line.   We strive to give you quality time with your provider. You may need to reschedule your appointment if you arrive late (15 or more minutes).  Arriving late affects you and other patients whose appointments are after yours.  Also, if you miss three or more appointments without notifying the office, you may be dismissed from the clinic at the provider's discretion.      For prescription refill requests, have your pharmacy contact our office and allow 72 hours for refills to be completed.    Today you received the following chemotherapy and/or immunotherapy agents: Kyprolis.       To help prevent nausea and vomiting after your treatment, we encourage you to take your nausea medication as directed.  BELOW ARE SYMPTOMS THAT SHOULD BE REPORTED IMMEDIATELY: *FEVER GREATER THAN 100.4 F (38 C) OR HIGHER *CHILLS OR SWEATING *NAUSEA AND VOMITING THAT IS NOT CONTROLLED WITH YOUR NAUSEA MEDICATION *UNUSUAL SHORTNESS OF BREATH *UNUSUAL BRUISING OR BLEEDING *URINARY PROBLEMS (pain or burning when urinating, or frequent urination) *BOWEL PROBLEMS (unusual diarrhea, constipation, pain near the anus) TENDERNESS IN MOUTH AND THROAT WITH OR WITHOUT PRESENCE OF ULCERS (sore throat, sores in mouth, or a toothache) UNUSUAL RASH, SWELLING OR PAIN  UNUSUAL VAGINAL DISCHARGE OR ITCHING   Items with * indicate a potential emergency and should be followed up as soon as possible or go to the Emergency Department if any problems should occur.  Please show the CHEMOTHERAPY ALERT CARD or IMMUNOTHERAPY ALERT CARD at  check-in to the Emergency Department and triage nurse.  Should you have questions after your visit or need to cancel or reschedule your appointment, please contact [facility]  Dept: [phone]  and follow the prompts.  Office hours are 8:00 a.m. to 4:30 p.m. Monday - Friday. Please note that voicemails left after 4:00 p.m. may not be returned until the following business day.  We are closed weekends and major holidays. You have access to a nurse at all times for urgent questions. Please call the main number to the clinic Dept: [phone] and follow the prompts.   For any non-urgent questions, you may also contact your provider using MyChart. We now offer e-Visits for anyone 34 and older to request care online for non-urgent symptoms. For details visit [email or web address].   Also download the MyChart app! Go to the app store, search "MyChart", open the app, select [facility], and log in with your MyChart username and password.

## 2022-10-02 NOTE — Progress Notes (Signed)
Per Dr Irene Limbo pt is ok for tx today with elevated HR (105-110)

## 2022-10-03 ENCOUNTER — Other Ambulatory Visit: Payer: Self-pay

## 2022-10-05 ENCOUNTER — Other Ambulatory Visit: Payer: Self-pay | Admitting: Hematology

## 2022-10-05 DIAGNOSIS — C9000 Multiple myeloma not having achieved remission: Secondary | ICD-10-CM

## 2022-10-08 ENCOUNTER — Other Ambulatory Visit: Payer: Self-pay

## 2022-10-09 ENCOUNTER — Inpatient Hospital Stay: Payer: BC Managed Care – PPO | Admitting: Dietician

## 2022-10-09 ENCOUNTER — Inpatient Hospital Stay: Payer: BC Managed Care – PPO

## 2022-10-09 ENCOUNTER — Other Ambulatory Visit: Payer: Self-pay | Admitting: Hematology

## 2022-10-09 VITALS — BP 150/79 | HR 74 | Temp 98.2°F | Resp 16 | Wt 198.9 lb

## 2022-10-09 DIAGNOSIS — I700 Atherosclerosis of aorta: Secondary | ICD-10-CM | POA: Diagnosis not present

## 2022-10-09 DIAGNOSIS — Z79891 Long term (current) use of opiate analgesic: Secondary | ICD-10-CM | POA: Diagnosis not present

## 2022-10-09 DIAGNOSIS — Z5112 Encounter for antineoplastic immunotherapy: Secondary | ICD-10-CM | POA: Diagnosis not present

## 2022-10-09 DIAGNOSIS — D3501 Benign neoplasm of right adrenal gland: Secondary | ICD-10-CM | POA: Diagnosis not present

## 2022-10-09 DIAGNOSIS — C9000 Multiple myeloma not having achieved remission: Secondary | ICD-10-CM

## 2022-10-09 DIAGNOSIS — Z7961 Long term (current) use of immunomodulator: Secondary | ICD-10-CM | POA: Diagnosis not present

## 2022-10-09 DIAGNOSIS — R0602 Shortness of breath: Secondary | ICD-10-CM | POA: Diagnosis not present

## 2022-10-09 DIAGNOSIS — D72819 Decreased white blood cell count, unspecified: Secondary | ICD-10-CM | POA: Diagnosis not present

## 2022-10-09 DIAGNOSIS — Z8616 Personal history of COVID-19: Secondary | ICD-10-CM | POA: Diagnosis not present

## 2022-10-09 DIAGNOSIS — R110 Nausea: Secondary | ICD-10-CM | POA: Diagnosis not present

## 2022-10-09 DIAGNOSIS — R059 Cough, unspecified: Secondary | ICD-10-CM | POA: Diagnosis not present

## 2022-10-09 DIAGNOSIS — Z7189 Other specified counseling: Secondary | ICD-10-CM

## 2022-10-09 DIAGNOSIS — M545 Low back pain, unspecified: Secondary | ICD-10-CM | POA: Diagnosis not present

## 2022-10-09 DIAGNOSIS — R5383 Other fatigue: Secondary | ICD-10-CM | POA: Diagnosis not present

## 2022-10-09 DIAGNOSIS — R21 Rash and other nonspecific skin eruption: Secondary | ICD-10-CM | POA: Diagnosis not present

## 2022-10-09 DIAGNOSIS — Z87891 Personal history of nicotine dependence: Secondary | ICD-10-CM | POA: Diagnosis not present

## 2022-10-09 DIAGNOSIS — M25511 Pain in right shoulder: Secondary | ICD-10-CM | POA: Diagnosis not present

## 2022-10-09 LAB — CBC WITH DIFFERENTIAL (CANCER CENTER ONLY)
Abs Immature Granulocytes: 0.01 10*3/uL (ref 0.00–0.07)
Basophils Absolute: 0 10*3/uL (ref 0.0–0.1)
Basophils Relative: 1 %
Eosinophils Absolute: 0.4 10*3/uL (ref 0.0–0.5)
Eosinophils Relative: 11 %
HCT: 41.1 % (ref 39.0–52.0)
Hemoglobin: 14.3 g/dL (ref 13.0–17.0)
Immature Granulocytes: 0 %
Lymphocytes Relative: 25 %
Lymphs Abs: 0.9 10*3/uL (ref 0.7–4.0)
MCH: 29.6 pg (ref 26.0–34.0)
MCHC: 34.8 g/dL (ref 30.0–36.0)
MCV: 85.1 fL (ref 80.0–100.0)
Monocytes Absolute: 0.8 10*3/uL (ref 0.1–1.0)
Monocytes Relative: 20 %
Neutro Abs: 1.6 10*3/uL — ABNORMAL LOW (ref 1.7–7.7)
Neutrophils Relative %: 43 %
Platelet Count: 103 10*3/uL — ABNORMAL LOW (ref 150–400)
RBC: 4.83 MIL/uL (ref 4.22–5.81)
RDW: 14.6 % (ref 11.5–15.5)
WBC Count: 3.7 10*3/uL — ABNORMAL LOW (ref 4.0–10.5)
nRBC: 0 % (ref 0.0–0.2)

## 2022-10-09 LAB — CMP (CANCER CENTER ONLY)
ALT: 16 U/L (ref 0–44)
AST: 14 U/L — ABNORMAL LOW (ref 15–41)
Albumin: 4 g/dL (ref 3.5–5.0)
Alkaline Phosphatase: 50 U/L (ref 38–126)
Anion gap: 8 (ref 5–15)
BUN: 14 mg/dL (ref 8–23)
CO2: 23 mmol/L (ref 22–32)
Calcium: 8.7 mg/dL — ABNORMAL LOW (ref 8.9–10.3)
Chloride: 107 mmol/L (ref 98–111)
Creatinine: 0.75 mg/dL (ref 0.61–1.24)
GFR, Estimated: >60 mL/min (ref >60)
Glucose, Bld: 110 mg/dL — ABNORMAL HIGH (ref 70–99)
Potassium: 3.7 mmol/L (ref 3.5–5.1)
Sodium: 138 mmol/L (ref 135–145)
Total Bilirubin: 0.9 mg/dL (ref 0.3–1.2)
Total Protein: 5.9 g/dL — ABNORMAL LOW (ref 6.5–8.1)

## 2022-10-09 MED ORDER — SODIUM CHLORIDE 0.9 % IV SOLN: Freq: Once | INTRAVENOUS | Status: AC

## 2022-10-09 MED ORDER — DEXAMETHASONE SODIUM PHOSPHATE 10 MG/ML IJ SOLN
8.0000 mg | Freq: Once | INTRAMUSCULAR | Status: AC
  Administered 2022-10-09: 8 mg via INTRAVENOUS
  Filled 2022-10-09: qty 1

## 2022-10-09 MED ORDER — SODIUM CHLORIDE 0.9 % IV SOLN
16.0000 mg/kg | Freq: Once | INTRAVENOUS | Status: AC
  Administered 2022-10-09: 1400 mg via INTRAVENOUS
  Filled 2022-10-09: qty 10

## 2022-10-09 MED ORDER — DEXTROSE 5 % IV SOLN
70.0000 mg/m2 | Freq: Once | INTRAVENOUS | Status: AC
  Administered 2022-10-09: 140 mg via INTRAVENOUS
  Filled 2022-10-09: qty 60

## 2022-10-09 MED ORDER — PALONOSETRON HCL INJECTION 0.25 MG/5ML
0.2500 mg | Freq: Once | INTRAVENOUS | Status: AC
  Administered 2022-10-09: 0.25 mg via INTRAVENOUS
  Filled 2022-10-09: qty 5

## 2022-10-09 MED ORDER — FAMOTIDINE IN NACL 20-0.9 MG/50ML-% IV SOLN
20.0000 mg | Freq: Once | INTRAVENOUS | Status: AC
  Administered 2022-10-09: 20 mg via INTRAVENOUS
  Filled 2022-10-09: qty 50

## 2022-10-09 MED ORDER — REVLIMID 15 MG PO CAPS: ORAL_CAPSULE | ORAL | 0 refills | Status: DC

## 2022-10-09 NOTE — Patient Instructions (Signed)
[facility]  Discharge Instructions: Thank you for choosing [facility] to provide your oncology and hematology care.   If you have a lab appointment with the [facility], please go directly to the [facility] and check in at the registration area.   Wear comfortable clothing and clothing appropriate for easy access to any Portacath or PICC line.   We strive to give you quality time with your provider. You may need to reschedule your appointment if you arrive late (15 or more minutes).  Arriving late affects you and other patients whose appointments are after yours.  Also, if you miss three or more appointments without notifying the office, you may be dismissed from the clinic at the provider's discretion.      For prescription refill requests, have your pharmacy contact our office and allow 72 hours for refills to be completed.    Today you received the following chemotherapy and/or immunotherapy agents: Daratumumab, Kyprolis.       To help prevent nausea and vomiting after your treatment, we encourage you to take your nausea medication as directed.  BELOW ARE SYMPTOMS THAT SHOULD BE REPORTED IMMEDIATELY: *FEVER GREATER THAN 100.4 F (38 C) OR HIGHER *CHILLS OR SWEATING *NAUSEA AND VOMITING THAT IS NOT CONTROLLED WITH YOUR NAUSEA MEDICATION *UNUSUAL SHORTNESS OF BREATH *UNUSUAL BRUISING OR BLEEDING *URINARY PROBLEMS (pain or burning when urinating, or frequent urination) *BOWEL PROBLEMS (unusual diarrhea, constipation, pain near the anus) TENDERNESS IN MOUTH AND THROAT WITH OR WITHOUT PRESENCE OF ULCERS (sore throat, sores in mouth, or a toothache) UNUSUAL RASH, SWELLING OR PAIN  UNUSUAL VAGINAL DISCHARGE OR ITCHING   Items with * indicate a potential emergency and should be followed up as soon as possible or go to the Emergency Department if any problems should occur.  Please show the CHEMOTHERAPY ALERT CARD or IMMUNOTHERAPY ALERT  CARD at check-in to the Emergency Department and triage nurse.  Should you have questions after your visit or need to cancel or reschedule your appointment, please contact [facility]  Dept: [phone]  and follow the prompts.  Office hours are 8:00 a.m. to 4:30 p.m. Monday - Friday. Please note that voicemails left after 4:00 p.m. may not be returned until the following business day.  We are closed weekends and major holidays. You have access to a nurse at all times for urgent questions. Please call the main number to the clinic Dept: [phone] and follow the prompts.   For any non-urgent questions, you may also contact your provider using MyChart. We now offer e-Visits for anyone 105 and older to request care online for non-urgent symptoms. For details visit [email or web address].   Also download the MyChart app! Go to the app store, search "MyChart", open the app, select [facility], and log in with your MyChart username and password.

## 2022-10-09 NOTE — Progress Notes (Signed)
Nutrition Follow-up:  Patient with multiple myeloma. He is receiving carfilzomib, daratumumab, revlimid + dexamethasone.     Met with patient during infusion. He reports tolerating treatment well. Patient endorses a good appetite and intake. Weights have been stable. He is currently taking antibiotics. This has given him diarrhea the last couple of days. He will finish this on 2/23.    Medications: reviewed   Labs: glucose 110, Ca 8.7  Anthropometrics: Wt 198 lb 13.7 oz today stable   2/14 - 198 lb 8 oz  2/7 - 199 lb 6.4 oz    NUTRITION DIAGNOSIS: Unintended weight loss stable     INTERVENTION:  Continue strategies for weight maintenance with small frequent meals and snacks Discussed strategies for diarrhea secondary to antibiotics - provided samples of Banatrol    MONITORING, EVALUATION, GOAL: weight trends, intake    NEXT VISIT: To be scheduled as needed

## 2022-10-10 ENCOUNTER — Other Ambulatory Visit: Payer: Self-pay

## 2022-10-10 ENCOUNTER — Other Ambulatory Visit: Payer: Self-pay | Admitting: Hematology

## 2022-10-10 MED ORDER — FENTANYL 25 MCG/HR TD PT72: 1.0000 | MEDICATED_PATCH | TRANSDERMAL | 0 refills | Status: DC

## 2022-10-16 ENCOUNTER — Other Ambulatory Visit: Payer: Self-pay

## 2022-10-19 ENCOUNTER — Other Ambulatory Visit: Payer: Self-pay | Admitting: Hematology

## 2022-10-19 DIAGNOSIS — C9000 Multiple myeloma not having achieved remission: Secondary | ICD-10-CM

## 2022-10-22 DIAGNOSIS — N401 Enlarged prostate with lower urinary tract symptoms: Secondary | ICD-10-CM | POA: Diagnosis not present

## 2022-10-22 DIAGNOSIS — R3914 Feeling of incomplete bladder emptying: Secondary | ICD-10-CM | POA: Diagnosis not present

## 2022-10-23 ENCOUNTER — Other Ambulatory Visit: Payer: Self-pay | Admitting: Hematology

## 2022-10-23 DIAGNOSIS — C9000 Multiple myeloma not having achieved remission: Secondary | ICD-10-CM

## 2022-10-24 ENCOUNTER — Inpatient Hospital Stay: Payer: BC Managed Care – PPO | Attending: Physician Assistant

## 2022-10-24 ENCOUNTER — Other Ambulatory Visit: Payer: Self-pay

## 2022-10-24 ENCOUNTER — Inpatient Hospital Stay: Payer: BC Managed Care – PPO

## 2022-10-24 VITALS — BP 134/80 | HR 73 | Temp 98.3°F | Resp 17 | Wt 198.5 lb

## 2022-10-24 DIAGNOSIS — I700 Atherosclerosis of aorta: Secondary | ICD-10-CM | POA: Insufficient documentation

## 2022-10-24 DIAGNOSIS — D3501 Benign neoplasm of right adrenal gland: Secondary | ICD-10-CM | POA: Insufficient documentation

## 2022-10-24 DIAGNOSIS — C9000 Multiple myeloma not having achieved remission: Secondary | ICD-10-CM | POA: Diagnosis not present

## 2022-10-24 DIAGNOSIS — Z7961 Long term (current) use of immunomodulator: Secondary | ICD-10-CM | POA: Insufficient documentation

## 2022-10-24 DIAGNOSIS — Z79891 Long term (current) use of opiate analgesic: Secondary | ICD-10-CM | POA: Diagnosis not present

## 2022-10-24 DIAGNOSIS — Z5112 Encounter for antineoplastic immunotherapy: Secondary | ICD-10-CM | POA: Diagnosis not present

## 2022-10-24 DIAGNOSIS — M545 Low back pain, unspecified: Secondary | ICD-10-CM | POA: Diagnosis not present

## 2022-10-24 DIAGNOSIS — M25511 Pain in right shoulder: Secondary | ICD-10-CM | POA: Diagnosis not present

## 2022-10-24 DIAGNOSIS — Z7189 Other specified counseling: Secondary | ICD-10-CM

## 2022-10-24 DIAGNOSIS — Z87891 Personal history of nicotine dependence: Secondary | ICD-10-CM | POA: Insufficient documentation

## 2022-10-24 LAB — CMP (CANCER CENTER ONLY)
ALT: 16 U/L (ref 0–44)
AST: 15 U/L (ref 15–41)
Albumin: 4.3 g/dL (ref 3.5–5.0)
Alkaline Phosphatase: 47 U/L (ref 38–126)
Anion gap: 8 (ref 5–15)
BUN: 13 mg/dL (ref 8–23)
CO2: 22 mmol/L (ref 22–32)
Calcium: 8.7 mg/dL — ABNORMAL LOW (ref 8.9–10.3)
Chloride: 107 mmol/L (ref 98–111)
Creatinine: 0.93 mg/dL (ref 0.61–1.24)
GFR, Estimated: >60 mL/min (ref >60)
Glucose, Bld: 133 mg/dL — ABNORMAL HIGH (ref 70–99)
Potassium: 4.2 mmol/L (ref 3.5–5.1)
Sodium: 137 mmol/L (ref 135–145)
Total Bilirubin: 0.9 mg/dL (ref 0.3–1.2)
Total Protein: 6.2 g/dL — ABNORMAL LOW (ref 6.5–8.1)

## 2022-10-24 LAB — CBC WITH DIFFERENTIAL (CANCER CENTER ONLY)
Abs Immature Granulocytes: 0.01 10*3/uL (ref 0.00–0.07)
Basophils Absolute: 0 10*3/uL (ref 0.0–0.1)
Basophils Relative: 1 %
Eosinophils Absolute: 0.3 10*3/uL (ref 0.0–0.5)
Eosinophils Relative: 7 %
HCT: 43 % (ref 39.0–52.0)
Hemoglobin: 14.7 g/dL (ref 13.0–17.0)
Immature Granulocytes: 0 %
Lymphocytes Relative: 22 %
Lymphs Abs: 0.8 10*3/uL (ref 0.7–4.0)
MCH: 30.1 pg (ref 26.0–34.0)
MCHC: 34.2 g/dL (ref 30.0–36.0)
MCV: 87.9 fL (ref 80.0–100.0)
Monocytes Absolute: 0.2 10*3/uL (ref 0.1–1.0)
Monocytes Relative: 6 %
Neutro Abs: 2.3 10*3/uL (ref 1.7–7.7)
Neutrophils Relative %: 64 %
Platelet Count: 177 10*3/uL (ref 150–400)
RBC: 4.89 MIL/uL (ref 4.22–5.81)
RDW: 14.4 % (ref 11.5–15.5)
WBC Count: 3.6 10*3/uL — ABNORMAL LOW (ref 4.0–10.5)
nRBC: 0 % (ref 0.0–0.2)

## 2022-10-24 MED ORDER — SODIUM CHLORIDE 0.9 % IV SOLN: Freq: Once | INTRAVENOUS | Status: AC

## 2022-10-24 MED ORDER — ACETAMINOPHEN 325 MG PO TABS
650.0000 mg | ORAL_TABLET | Freq: Once | ORAL | Status: AC
  Administered 2022-10-24: 650 mg via ORAL
  Filled 2022-10-24: qty 2

## 2022-10-24 MED ORDER — SODIUM CHLORIDE 0.9% FLUSH: 10.0000 mL | INTRAVENOUS | Status: DC | PRN

## 2022-10-24 MED ORDER — DEXAMETHASONE SODIUM PHOSPHATE 10 MG/ML IJ SOLN
8.0000 mg | Freq: Once | INTRAMUSCULAR | Status: AC
  Administered 2022-10-24: 8 mg via INTRAVENOUS
  Filled 2022-10-24: qty 1

## 2022-10-24 MED ORDER — PALONOSETRON HCL INJECTION 0.25 MG/5ML
0.2500 mg | Freq: Once | INTRAVENOUS | Status: AC
  Administered 2022-10-24: 0.25 mg via INTRAVENOUS
  Filled 2022-10-24: qty 5

## 2022-10-24 MED ORDER — DEXTROSE 5 % IV SOLN
70.0000 mg/m2 | Freq: Once | INTRAVENOUS | Status: AC
  Administered 2022-10-24: 140 mg via INTRAVENOUS
  Filled 2022-10-24: qty 60

## 2022-10-24 MED ORDER — HEPARIN SOD (PORK) LOCK FLUSH 100 UNIT/ML IV SOLN: 500.0000 [IU] | Freq: Once | INTRAVENOUS | Status: DC | PRN

## 2022-10-24 NOTE — Patient Instructions (Signed)
[facility]  Discharge Instructions: Thank you for choosing [facility] to provide your oncology and hematology care.   If you have a lab appointment with the [facility], please go directly to the [facility] and check in at the registration area.   Wear comfortable clothing and clothing appropriate for easy access to any Portacath or PICC line.   We strive to give you quality time with your provider. You may need to reschedule your appointment if you arrive late (15 or more minutes).  Arriving late affects you and other patients whose appointments are after yours.  Also, if you miss three or more appointments without notifying the office, you may be dismissed from the clinic at the provider's discretion.      For prescription refill requests, have your pharmacy contact our office and allow 72 hours for refills to be completed.    Today you received the following chemotherapy and/or immunotherapy agents: Kyprolis.       To help prevent nausea and vomiting after your treatment, we encourage you to take your nausea medication as directed.  BELOW ARE SYMPTOMS THAT SHOULD BE REPORTED IMMEDIATELY: *FEVER GREATER THAN 100.4 F (38 C) OR HIGHER *CHILLS OR SWEATING *NAUSEA AND VOMITING THAT IS NOT CONTROLLED WITH YOUR NAUSEA MEDICATION *UNUSUAL SHORTNESS OF BREATH *UNUSUAL BRUISING OR BLEEDING *URINARY PROBLEMS (pain or burning when urinating, or frequent urination) *BOWEL PROBLEMS (unusual diarrhea, constipation, pain near the anus) TENDERNESS IN MOUTH AND THROAT WITH OR WITHOUT PRESENCE OF ULCERS (sore throat, sores in mouth, or a toothache) UNUSUAL RASH, SWELLING OR PAIN  UNUSUAL VAGINAL DISCHARGE OR ITCHING   Items with * indicate a potential emergency and should be followed up as soon as possible or go to the Emergency Department if any problems should occur.  Please show the CHEMOTHERAPY ALERT CARD or IMMUNOTHERAPY ALERT CARD at  check-in to the Emergency Department and triage nurse.  Should you have questions after your visit or need to cancel or reschedule your appointment, please contact [facility]  Dept: [phone]  and follow the prompts.  Office hours are 8:00 a.m. to 4:30 p.m. Monday - Friday. Please note that voicemails left after 4:00 p.m. may not be returned until the following business day.  We are closed weekends and major holidays. You have access to a nurse at all times for urgent questions. Please call the main number to the clinic Dept: [phone] and follow the prompts.   For any non-urgent questions, you may also contact your provider using MyChart. We now offer e-Visits for anyone 71 and older to request care online for non-urgent symptoms. For details visit [email or web address].   Also download the MyChart app! Go to the app store, search "MyChart", open the app, select [facility], and log in with your MyChart username and password.

## 2022-10-25 MED ORDER — OXYCODONE HCL 5 MG PO TABS: 5.0000 mg | ORAL_TABLET | ORAL | 0 refills | Status: DC | PRN

## 2022-10-29 DIAGNOSIS — N401 Enlarged prostate with lower urinary tract symptoms: Secondary | ICD-10-CM | POA: Diagnosis not present

## 2022-10-29 DIAGNOSIS — R3914 Feeling of incomplete bladder emptying: Secondary | ICD-10-CM | POA: Diagnosis not present

## 2022-10-29 DIAGNOSIS — R972 Elevated prostate specific antigen [PSA]: Secondary | ICD-10-CM | POA: Diagnosis not present

## 2022-10-30 LAB — MULTIPLE MYELOMA PANEL, SERUM
Albumin SerPl Elph-Mcnc: 3.4 g/dL (ref 2.9–4.4)
Albumin/Glob SerPl: 1.6 (ref 0.7–1.7)
Alpha 1: 0.3 g/dL (ref 0.0–0.4)
Alpha2 Glob SerPl Elph-Mcnc: 0.5 g/dL (ref 0.4–1.0)
B-Globulin SerPl Elph-Mcnc: 1 g/dL (ref 0.7–1.3)
Gamma Glob SerPl Elph-Mcnc: 0.4 g/dL (ref 0.4–1.8)
Globulin, Total: 2.2 g/dL (ref 2.2–3.9)
IgA: 11 mg/dL — ABNORMAL LOW (ref 61–437)
IgG (Immunoglobin G), Serum: 421 mg/dL — ABNORMAL LOW (ref 603–1613)
IgM (Immunoglobulin M), Srm: 8 mg/dL — ABNORMAL LOW (ref 20–172)
M Protein SerPl Elph-Mcnc: 0.1 g/dL — ABNORMAL HIGH
Total Protein ELP: 5.6 g/dL — ABNORMAL LOW (ref 6.0–8.5)

## 2022-11-02 ENCOUNTER — Other Ambulatory Visit: Payer: Self-pay | Admitting: Hematology

## 2022-11-02 DIAGNOSIS — C9000 Multiple myeloma not having achieved remission: Secondary | ICD-10-CM

## 2022-11-04 ENCOUNTER — Other Ambulatory Visit: Payer: Self-pay | Admitting: Hematology

## 2022-11-04 ENCOUNTER — Encounter: Payer: Self-pay | Admitting: Hematology

## 2022-11-04 DIAGNOSIS — C9000 Multiple myeloma not having achieved remission: Secondary | ICD-10-CM

## 2022-11-04 MED ORDER — METHOCARBAMOL 500 MG PO TABS: 500.0000 mg | ORAL_TABLET | Freq: Three times a day (TID) | ORAL | 0 refills | Status: DC | PRN

## 2022-11-05 ENCOUNTER — Encounter: Payer: Self-pay | Admitting: Hematology

## 2022-11-05 MED ORDER — FENTANYL 25 MCG/HR TD PT72: 1.0000 | MEDICATED_PATCH | TRANSDERMAL | 0 refills | Status: DC

## 2022-11-06 ENCOUNTER — Inpatient Hospital Stay (HOSPITAL_BASED_OUTPATIENT_CLINIC_OR_DEPARTMENT_OTHER): Payer: BC Managed Care – PPO | Admitting: Hematology

## 2022-11-06 ENCOUNTER — Inpatient Hospital Stay: Payer: BC Managed Care – PPO

## 2022-11-06 ENCOUNTER — Other Ambulatory Visit: Payer: Self-pay | Admitting: Hematology

## 2022-11-06 VITALS — BP 138/85 | HR 64 | Temp 97.4°F | Resp 18 | Ht 67.0 in | Wt 196.8 lb

## 2022-11-06 DIAGNOSIS — C9000 Multiple myeloma not having achieved remission: Secondary | ICD-10-CM

## 2022-11-06 DIAGNOSIS — Z7961 Long term (current) use of immunomodulator: Secondary | ICD-10-CM | POA: Diagnosis not present

## 2022-11-06 DIAGNOSIS — M25511 Pain in right shoulder: Secondary | ICD-10-CM | POA: Diagnosis not present

## 2022-11-06 DIAGNOSIS — Z7189 Other specified counseling: Secondary | ICD-10-CM

## 2022-11-06 DIAGNOSIS — Z5112 Encounter for antineoplastic immunotherapy: Secondary | ICD-10-CM | POA: Diagnosis not present

## 2022-11-06 DIAGNOSIS — D3501 Benign neoplasm of right adrenal gland: Secondary | ICD-10-CM | POA: Diagnosis not present

## 2022-11-06 DIAGNOSIS — I700 Atherosclerosis of aorta: Secondary | ICD-10-CM | POA: Diagnosis not present

## 2022-11-06 DIAGNOSIS — M545 Low back pain, unspecified: Secondary | ICD-10-CM | POA: Diagnosis not present

## 2022-11-06 DIAGNOSIS — Z79891 Long term (current) use of opiate analgesic: Secondary | ICD-10-CM | POA: Diagnosis not present

## 2022-11-06 DIAGNOSIS — Z87891 Personal history of nicotine dependence: Secondary | ICD-10-CM | POA: Diagnosis not present

## 2022-11-06 LAB — CMP (CANCER CENTER ONLY)
ALT: 14 U/L (ref 0–44)
AST: 13 U/L — ABNORMAL LOW (ref 15–41)
Albumin: 4.4 g/dL (ref 3.5–5.0)
Alkaline Phosphatase: 51 U/L (ref 38–126)
Anion gap: 7 (ref 5–15)
BUN: 14 mg/dL (ref 8–23)
CO2: 24 mmol/L (ref 22–32)
Calcium: 9 mg/dL (ref 8.9–10.3)
Chloride: 107 mmol/L (ref 98–111)
Creatinine: 0.86 mg/dL (ref 0.61–1.24)
GFR, Estimated: >60 mL/min (ref >60)
Glucose, Bld: 110 mg/dL — ABNORMAL HIGH (ref 70–99)
Potassium: 4.1 mmol/L (ref 3.5–5.1)
Sodium: 138 mmol/L (ref 135–145)
Total Bilirubin: 0.9 mg/dL (ref 0.3–1.2)
Total Protein: 6.2 g/dL — ABNORMAL LOW (ref 6.5–8.1)

## 2022-11-06 LAB — CBC WITH DIFFERENTIAL (CANCER CENTER ONLY)
Abs Immature Granulocytes: 0.01 10*3/uL (ref 0.00–0.07)
Basophils Absolute: 0 10*3/uL (ref 0.0–0.1)
Basophils Relative: 1 %
Eosinophils Absolute: 0.3 10*3/uL (ref 0.0–0.5)
Eosinophils Relative: 7 %
HCT: 44.2 % (ref 39.0–52.0)
Hemoglobin: 15.1 g/dL (ref 13.0–17.0)
Immature Granulocytes: 0 %
Lymphocytes Relative: 24 %
Lymphs Abs: 0.9 10*3/uL (ref 0.7–4.0)
MCH: 29.8 pg (ref 26.0–34.0)
MCHC: 34.2 g/dL (ref 30.0–36.0)
MCV: 87.2 fL (ref 80.0–100.0)
Monocytes Absolute: 0.6 10*3/uL (ref 0.1–1.0)
Monocytes Relative: 15 %
Neutro Abs: 2 10*3/uL (ref 1.7–7.7)
Neutrophils Relative %: 53 %
Platelet Count: 140 10*3/uL — ABNORMAL LOW (ref 150–400)
RBC: 5.07 MIL/uL (ref 4.22–5.81)
RDW: 13.8 % (ref 11.5–15.5)
WBC Count: 3.8 10*3/uL — ABNORMAL LOW (ref 4.0–10.5)
nRBC: 0 % (ref 0.0–0.2)

## 2022-11-06 MED ORDER — SODIUM CHLORIDE 0.9 % IV SOLN: Freq: Once | INTRAVENOUS | Status: AC

## 2022-11-06 MED ORDER — DEXTROSE 5 % IV SOLN
70.0000 mg/m2 | Freq: Once | INTRAVENOUS | Status: AC
  Administered 2022-11-06: 140 mg via INTRAVENOUS
  Filled 2022-11-06: qty 60

## 2022-11-06 MED ORDER — DEXAMETHASONE SODIUM PHOSPHATE 10 MG/ML IJ SOLN
8.0000 mg | Freq: Once | INTRAMUSCULAR | Status: AC
  Administered 2022-11-06: 8 mg via INTRAVENOUS
  Filled 2022-11-06: qty 1

## 2022-11-06 MED ORDER — PALONOSETRON HCL INJECTION 0.25 MG/5ML
0.2500 mg | Freq: Once | INTRAVENOUS | Status: AC
  Administered 2022-11-06: 0.25 mg via INTRAVENOUS
  Filled 2022-11-06: qty 5

## 2022-11-06 NOTE — Progress Notes (Signed)
HEMATOLOGY/ONCOLOGY CLINIC NOTE  Date of Service: 11/06/22    Patient Care Team: Lennie Odor, [place] as PCP - General (Nurse Practitioner)  CHIEF COMPLAINTS/PURPOSE OF CONSULTATION:  Follow-up for continued evaluation and management of multiple myeloma  HISTORY OF PRESENTING ILLNESS:   Anthony [name] is a wonderful 62 y.o. male who has been referred to [place] by Dr Lennie Odor, PA for evaluation and management of newly diagnosed multiple myeloma. He reports He is doing well.  He reports persistent lower back pain that he has had since he was in his 20's. He notes no previous significant injuries. He further notes that he gets chiropractic adjustments and takes Tylenol to manage his symptoms. He notes less back pain when standing up on his right leg and maintaining weight on his right leg.  He reports previous history of smoking and chewing tobacco over 40 years ago.  He had a recent fall back in February of this year with only a pulled muscle. And he notes another fall when chasing his cat where he fell on his right shoulder. He reports pain in right shoulder.   He had a recent COVID-19 infection back in March.  He reports intermittent cough with SOB. He notes he takes 2 Rolaids at night. We discussed potentially trying antacids which he is agreeable to as he will begin steroids soon and was advised of the possible symptoms and side effects from taking steroids.  We discussed CRAB criteria and that he meets at least two of the criterion being  anemia and bone disease.  We discussed getting additional scans for further evaluation which he is agreeable to.  We further discussed starting Daratumumab/Velcade/Dexamethasone/Zometa and Revlimid for treatment which he is agreeable to. We also discussed starting steroids before treatment and taking an acid suppressant which he was also agreeable to.  We discussed getting Senna and taking it as needed for constipation.  Labs done today  were reviewed in detail.  We discussed his recent bone marrow biopsy and aspiration done 02/06/2022.  We discussed PET/CT scan done 01/28/2022.  We discussed CT right shoulder w/o contrast done 11/13/2021  INTERVAL HISTORY:  Anthony [name] is a 62 y.o. male, is here for continued evaluation of and management of multiple myeloma. Patient was last seen by me on 09/25/2022 and complained of activity related back pain, fatigue upon awakening, and a skin rash near his right hand.  Today, he is scheduled to receive day 15 cycle 8 of treatment and he is accompanied by his wife. He complains of worsening nausea during the time of his treatment and low-grade nausea on days he does not receive treatment. His wife adds that his nausea does occur on most mornings and throughout most days. He denies any acid reflux and is moving his bowels regularly. He does not feel excessively full after eating. He did vomit on one occasion which did relieve his nausea. Patient does not consume any alcohol but does drink caffeine frequently.  He reports that Aloxi did initially improve symptoms, but it has not since. Patient does take Nexium in the mornings and 40 MG Pepcid at nights. He has used Compazine on one occasion which has helped his nausea. He has not tried to take Compazine 48 hours after treatment, but is interested to try. He believes that when he previously took Marinol, it did improve nausea. He does note that he does take Claritin every morning. He has not taken any Flexeril recently.  He complains that his  back pain is worsening, but he does take methocarbamol and oxycodone which is effective. He has also had some sleep difficulty over the past week and does endorse coughing 2-3 nights a week around 3-4 AM.  MEDICAL HISTORY:  Past Medical History:  Diagnosis Date   Back pain    Cancer ([place])    Multiple myeloma ([place]) 02/15/2022    SURGICAL HISTORY: Past Surgical History:  Procedure Laterality Date    IR BONE TUMOR(S)RF ABLATION  05/07/2022   IR BONE TUMOR(S)RF ABLATION  05/07/2022   IR BONE TUMOR(S)RF ABLATION  05/07/2022   IR KYPHO EA ADDL LEVEL THORACIC OR LUMBAR  05/07/2022   IR KYPHO LUMBAR INC FX REDUCE BONE BX UNI/BIL CANNULATION INC/IMAGING  05/07/2022   IR KYPHO THORACIC WITH BONE BIOPSY  05/07/2022   IR RADIOLOGIST EVAL & MGMT  04/23/2022   IR RADIOLOGIST EVAL & MGMT  05/17/2022   IR RADIOLOGIST EVAL & MGMT  06/06/2022    SOCIAL HISTORY: Social History   Socioeconomic History   Marital status: Married    Spouse name: Not on file   Number of children: Not on file   Years of education: Not on file   Highest education level: Not on file  Occupational History   Not on file  Tobacco Use   Smoking status: Never   Smokeless tobacco: Former    Types: Chew, Snuff  Vaping Use   Vaping Use: Never used  Substance and Sexual Activity   Alcohol use: Yes    Alcohol/week: 2.0 standard drinks of alcohol    Types: 2 Glasses of wine per week   Drug use: No   Sexual activity: Yes  Other Topics Concern   Not on file  Social History Narrative   Not on file   Social Determinants of Health   Financial Resource Strain: Low Risk  (05/07/2022)   Overall Financial Resource Strain (CARDIA)    Difficulty of Paying Living Expenses: Not very hard  Food Insecurity: No Food Insecurity (05/07/2022)   Hunger Vital Sign    Worried About Running Out of Food in the Last Year: Never true    Ran Out of Food in the Last Year: Never true  Transportation Needs: No Transportation Needs (05/07/2022)   PRAPARE - Hydrologist (Medical): No    Lack of Transportation (Non-Medical): No  Physical Activity: Not on file  Stress: Not on file  Social Connections: Not on file  Intimate Partner Violence: Unknown (05/07/2022)   Humiliation, Afraid, Rape, and Kick questionnaire    Fear of Current or Ex-Partner: No    Emotionally Abused: No    Physically Abused: No    Sexually Abused: Not  on file    FAMILY HISTORY: Family History  Problem Relation Age of Onset   Rheum arthritis Mother    Other Sister        Pre-cancerous uterine mass;    Rheum arthritis Sister    Bone cancer Paternal Grandfather    Brain cancer Maternal Uncle    Brain cancer Maternal Aunt    Osteoporosis Neg Hx     ALLERGIES:  has No Known Allergies.  MEDICATIONS:  Current Outpatient Medications  Medication Sig Dispense Refill   acyclovir (ZOVIRAX) 400 MG tablet Take 1 tablet (400 mg total) by mouth 2 (two) times daily. 60 tablet 11   aluminum chloride (DRYSOL) 20 % external solution Apply 1 application topically at bedtime.     apixaban (ELIQUIS) 5 MG TABS tablet  Take 1 tablet (5 mg total) by mouth 2 (two) times daily. 60 tablet 5   calcium-vitamin D (OSCAL WITH D) 250-125 MG-UNIT tablet Take 1 tablet by mouth daily.     cyclobenzaprine (FLEXERIL) 10 MG tablet Take 10 mg by mouth 3 (three) times daily as needed for muscle spasms. (Patient not taking: Reported on 05/21/2022)     diclofenac sodium (VOLTAREN) 1 % GEL Apply 2 g topically as needed.     dronabinol (MARINOL) 5 MG capsule Take 1 capsule (5 mg total) by mouth 2 (two) times daily before a meal. 60 capsule 0   ergocalciferol (VITAMIN D2) 1.25 MG (50000 UT) capsule Take 1 capsule (50,000 Units total) by mouth 2 (two) times daily. 12 capsule 2   esomeprazole (NEXIUM) 40 MG capsule Take 1 capsule (40 mg total) by mouth daily before breakfast. 30 capsule 1   fentaNYL (DURAGESIC) 25 MCG/HR Place 1 patch onto the skin every 3 (three) days. 10 patch 0   furosemide (LASIX) 20 MG tablet Take 1 tablet (20 mg total) by mouth daily. 30 tablet 1   lidocaine (LIDODERM) 5 % Place 1 patch onto the skin daily. Remove & Discard patch within 12 hours or as directed by MD 30 patch 0   methocarbamol (ROBAXIN) 500 MG tablet Take 1 tablet (500 mg total) by mouth every 8 (eight) hours as needed for muscle spasms. 30 tablet 0   methylPREDNISolone (MEDROL DOSEPAK) 4 MG  TBPK tablet Take 6 pills by mouth day 1, 5 on day 2, 4 on day 3, 3 on day 4, 2 on day 5, 1 on day 6 21 tablet 0   Multiple Vitamin (MULTIVITAMIN) tablet Take 1 tablet by mouth daily.     Omega-3 Fatty Acids (FISH OIL) 1360 MG CAPS Take 1 capsule by mouth daily.     ondansetron (ZOFRAN) 8 MG tablet Take 1 tablet (8 mg total) by mouth every 8 (eight) hours as needed for nausea or vomiting. 30 tablet 0   oxyCODONE (ROXICODONE) 5 MG immediate release tablet Take 1 tablet (5 mg total) by mouth every 4 (four) hours as needed for severe pain or moderate pain. Take 1 to 2 tablets every 4 hours as needed for moderate to severe pain. 60 tablet 0   REVLIMID 15 MG capsule TAKE 1 CAPSULE (15 mg) DAILY FOR 21 DAYS ON THEN 7 DAYS OFF 21 capsule 0   senna-docusate (SENNA S) 8.6-50 MG tablet Take 2 tablets by mouth at bedtime. 60 tablet 1   terbinafine (LAMISIL) 1 % cream Apply 1 application topically 2 (two) times daily.     No current facility-administered medications for this visit.   REVIEW OF SYSTEMS:  10 Point review of Systems was done is negative except as noted above.  PHYSICAL EXAMINATION: .BP (!) 152/98   Pulse 73   Temp (!) 97.2 F (36.2 C)   Resp 20   Wt 199 lb 6.4 oz (90.4 kg)   SpO2 99%   BMI 31.23 kg/m    GENERAL:alert, in no acute distress and comfortable SKIN: no acute rashes, no significant lesions EYES: conjunctiva are pink and non-injected, sclera anicteric OROPHARYNX: MMM, no exudates, no oropharyngeal erythema or ulceration NECK: supple, no JVD LYMPH:  no palpable lymphadenopathy in the cervical, axillary or inguinal regions LUNGS: clear to auscultation b/l with normal respiratory effort HEART: regular rate & rhythm ABDOMEN:  normoactive bowel sounds , non tender, not distended. Extremity: no pedal edema PSYCH: alert & oriented x 3 with  fluent speech NEURO: no focal motor/sensory deficits    LABORATORY DATA:  I have reviewed the data as listed .    Latest Ref Rng &  Units 11/06/2022   10:28 AM 10/24/2022    8:45 AM 10/09/2022    9:18 AM  CBC  WBC 4.0 - 10.5 K/uL 3.8  3.6  3.7   Hemoglobin 13.0 - 17.0 g/dL 15.1  14.7  14.3   Hematocrit 39.0 - 52.0 % 44.2  43.0  41.1   Platelets 150 - 400 K/uL 140  177  103       Latest Ref Rng & Units 11/06/2022   10:28 AM 10/24/2022    8:45 AM 10/09/2022    9:18 AM  CMP  Glucose 70 - 99 mg/dL 110  133  110   BUN 8 - 23 mg/dL 14  13  14    Creatinine 0.61 - 1.24 mg/dL 0.86  0.93  0.75   Sodium 135 - 145 mmol/L 138  137  138   Potassium 3.5 - 5.1 mmol/L 4.1  4.2  3.7   Chloride 98 - 111 mmol/L 107  107  107   CO2 22 - 32 mmol/L 24  22  23    Calcium 8.9 - 10.3 mg/dL 9.0  8.7  8.7   Total Protein 6.5 - 8.1 g/dL 6.2  6.2  5.9   Total Bilirubin 0.3 - 1.2 mg/dL 0.9  0.9  0.9   Alkaline Phos 38 - 126 U/L 51  47  50   AST 15 - 41 U/L 13  15  14    ALT 0 - 44 U/L 14  16  16      PATHOLOGY Surgical Pathology CASE: [id number] PATIENT: Dylin Runyon Bone Marrow Report     Clinical History: Multiple Myeloma     DIAGNOSIS:  BONE MARROW, ASPIRATE, CLOT, CORE: -Hypercellular bone marrow (60%) with erythroid predominant trilineage hematopoiesis and no evidence of residual plasma cell neoplasm (less than 1% plasma cells by manual aspirate differential, and CD138 immunohistochemical analysis of clot and core sections).  PERIPHERAL BLOOD: -Leukopenia  MICROSCOPIC DESCRIPTION:  PERIPHERAL BLOOD SMEAR: Platelets: Adequate in number, no platelet clumps identified Erythroid: Normocytic normochromic red blood cells Leukocytes: Mild leukopenia, negative for dysplastic granulocytes, blasts or  plasma cells  BONE MARROW ASPIRATE: Cellular Erythroid precursors: Relatively increased, show a full sequence of maturation with occasional abnormal forms (including binucleate forms, megaloblastoid change and nuclear membrane irregularities) Granulocytic precursors: Decreased, show a full sequence of generally orderly  maturation Megakaryocytes: Normal in number and morphology Lymphocytes/plasma cells: Not increased   02/06/2022 Molecular pathology   RADIOGRAPHIC STUDIES: I have personally reviewed the radiological images as listed and agreed with the findings in the report. No results found.   ASSESSMENT & PLAN:   62 y.o. very pleasant male with  1. R-ISS stage III multiple myeloma with extensive bone metastases and patholgiic fracture rt shoulder -Recent bone marrow biopsy and aspiration done 02/06/2022 revealed hypercellular bone marrow with plasma cell neoplasm. 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  Plan:  -Discussed lab results on 11/06/2022 with patient in detail. CBC normal, showed WBC of 3.8K, hemoglobin of 15.1, and platelets of 140K. -10/24/2022 myeloma lab revealed M-spike of 0.1 -CMP stable -plan to limit steroid usage with treatment -Reasonable to use Marinol for low-grade nausea symptoms -discussed option to lower medication dose, which patient would not like to proceed with at this time -increase Nexium dose to twice a day to improve nausea -  recommend patient to regularly wear loose-fitted clothing to improve nausea. Also recommended patient to limit caffeine consumption. -Patient may have gastritis or acid reflux -discussed option of low-dose Zyprexa to improve nausea, which may increase appetite or fatigue -discussed that nausea may occur due to narcotics. Discussed Hydroxyzine as another option to improve nausea. -answered all of patient's and his wife's questions at great length regarding medication options to improve nausea and clinical workup -will plan to minimize narcotics if possible to improve nausea symptoms -Advised patient to take Zofran 48 hours after receiving infusion -Continue maintenance Carfilzomib every 2 weeks and maintenance Revlimid -May lower Revlimid dose to 10 MG if patient's labs continue to be  stable -no clinical sign or evidence of multiple myeloma progression at this time   FOLLOW-UP: Per integrated scheduling  The total time spent in the appointment was 32 minutes* .  All of the patient's questions were answered with apparent satisfaction. The patient knows to call the clinic with any problems, questions or concerns.   Sullivan Lone MD MS AAHIVMS [facility] Hematology/Oncology Physician [facility]  .*Total Encounter Time as defined by the Centers for Medicare and Medicaid Services includes, in addition to the face-to-face time of a patient visit (documented in the note above) non-face-to-face time: obtaining and reviewing outside history, ordering and reviewing medications, tests or procedures, care coordination (communications with other health care professionals or caregivers) and documentation in the medical record.    I,Mitra Faeizi,acting as a Education administrator for Sullivan Lone, MD.,have documented all relevant documentation on the behalf of Sullivan Lone, MD,as directed by  Sullivan Lone, MD while in the presence of Sullivan Lone, MD.  .I have reviewed the above documentation for accuracy and completeness, and I agree with the above. Brunetta Genera MD

## 2022-11-06 NOTE — Patient Instructions (Signed)
[facility]  Discharge Instructions: Thank you for choosing [facility] to provide your oncology and hematology care.   If you have a lab appointment with the Cancer Center, please go directly to the Cancer Center and check in at the registration area.   Wear comfortable clothing and clothing appropriate for easy access to any Portacath or PICC line.   We strive to give you quality time with your provider. You may need to reschedule your appointment if you arrive late (15 or more minutes).  Arriving late affects you and other patients whose appointments are after yours.  Also, if you miss three or more appointments without notifying the office, you may be dismissed from the clinic at the provider's discretion.      For prescription refill requests, have your pharmacy contact our office and allow 72 hours for refills to be completed.    Today you received the following chemotherapy and/or immunotherapy agents :  Kyprolis   To help prevent nausea and vomiting after your treatment, we encourage you to take your nausea medication as directed.  BELOW ARE SYMPTOMS THAT SHOULD BE REPORTED IMMEDIATELY: *FEVER GREATER THAN 100.4 F (38 C) OR HIGHER *CHILLS OR SWEATING *NAUSEA AND VOMITING THAT IS NOT CONTROLLED WITH YOUR NAUSEA MEDICATION *UNUSUAL SHORTNESS OF BREATH *UNUSUAL BRUISING OR BLEEDING *URINARY PROBLEMS (pain or burning when urinating, or frequent urination) *BOWEL PROBLEMS (unusual diarrhea, constipation, pain near the anus) TENDERNESS IN MOUTH AND THROAT WITH OR WITHOUT PRESENCE OF ULCERS (sore throat, sores in mouth, or a toothache) UNUSUAL RASH, SWELLING OR PAIN  UNUSUAL VAGINAL DISCHARGE OR ITCHING   Items with * indicate a potential emergency and should be followed up as soon as possible or go to the Emergency Department if any problems should occur.  Please show the CHEMOTHERAPY ALERT CARD or IMMUNOTHERAPY ALERT CARD at  check-in to the Emergency Department and triage nurse.  Should you have questions after your visit or need to cancel or reschedule your appointment, please contact [facility]  Dept: [phone]  and follow the prompts.  Office hours are 8:00 a.m. to 4:30 p.m. Monday - Friday. Please note that voicemails left after 4:00 p.m. may not be returned until the following business day.  We are closed weekends and major holidays. You have access to a nurse at all times for urgent questions. Please call the main number to the clinic Dept: [phone] and follow the prompts.   For any non-urgent questions, you may also contact your provider using MyChart. We now offer e-Visits for anyone 18 and older to request care online for non-urgent symptoms. For details visit [email or web address].   Also download the MyChart app! Go to the app store, search "MyChart", open the app, select [place], and log in with your MyChart username and password.   

## 2022-11-07 ENCOUNTER — Other Ambulatory Visit: Payer: Self-pay

## 2022-11-07 ENCOUNTER — Encounter: Payer: Self-pay | Admitting: Hematology

## 2022-11-07 MED ORDER — REVLIMID 15 MG PO CAPS: ORAL_CAPSULE | ORAL | 0 refills | Status: DC

## 2022-11-08 ENCOUNTER — Encounter: Payer: Self-pay | Admitting: Hematology

## 2022-11-08 ENCOUNTER — Other Ambulatory Visit: Payer: Self-pay | Admitting: Hematology

## 2022-11-08 DIAGNOSIS — C9000 Multiple myeloma not having achieved remission: Secondary | ICD-10-CM

## 2022-11-08 MED ORDER — DRONABINOL 5 MG PO CAPS: 5.0000 mg | ORAL_CAPSULE | Freq: Two times a day (BID) | ORAL | 0 refills | Status: DC

## 2022-11-12 ENCOUNTER — Encounter: Payer: Self-pay | Admitting: Hematology

## 2022-11-16 ENCOUNTER — Other Ambulatory Visit: Payer: Self-pay | Admitting: Hematology

## 2022-11-16 DIAGNOSIS — Z7189 Other specified counseling: Secondary | ICD-10-CM

## 2022-11-16 DIAGNOSIS — C9000 Multiple myeloma not having achieved remission: Secondary | ICD-10-CM

## 2022-11-19 ENCOUNTER — Other Ambulatory Visit: Payer: Self-pay

## 2022-11-20 ENCOUNTER — Inpatient Hospital Stay: Payer: BC Managed Care – PPO | Attending: Physician Assistant

## 2022-11-20 ENCOUNTER — Inpatient Hospital Stay: Payer: BC Managed Care – PPO

## 2022-11-20 VITALS — BP 134/84 | HR 67 | Temp 98.3°F | Resp 18 | Ht 67.0 in | Wt 197.5 lb

## 2022-11-20 DIAGNOSIS — M545 Low back pain, unspecified: Secondary | ICD-10-CM | POA: Insufficient documentation

## 2022-11-20 DIAGNOSIS — D3501 Benign neoplasm of right adrenal gland: Secondary | ICD-10-CM | POA: Insufficient documentation

## 2022-11-20 DIAGNOSIS — Z5112 Encounter for antineoplastic immunotherapy: Secondary | ICD-10-CM | POA: Insufficient documentation

## 2022-11-20 DIAGNOSIS — C9000 Multiple myeloma not having achieved remission: Secondary | ICD-10-CM | POA: Diagnosis not present

## 2022-11-20 DIAGNOSIS — I700 Atherosclerosis of aorta: Secondary | ICD-10-CM | POA: Diagnosis not present

## 2022-11-20 DIAGNOSIS — M25511 Pain in right shoulder: Secondary | ICD-10-CM | POA: Diagnosis not present

## 2022-11-20 DIAGNOSIS — Z7189 Other specified counseling: Secondary | ICD-10-CM

## 2022-11-20 DIAGNOSIS — Z87891 Personal history of nicotine dependence: Secondary | ICD-10-CM | POA: Insufficient documentation

## 2022-11-20 DIAGNOSIS — Z7961 Long term (current) use of immunomodulator: Secondary | ICD-10-CM | POA: Insufficient documentation

## 2022-11-20 DIAGNOSIS — Z79891 Long term (current) use of opiate analgesic: Secondary | ICD-10-CM | POA: Diagnosis not present

## 2022-11-20 LAB — CMP (CANCER CENTER ONLY)
ALT: 14 U/L (ref 0–44)
AST: 14 U/L — ABNORMAL LOW (ref 15–41)
Albumin: 4.3 g/dL (ref 3.5–5.0)
Alkaline Phosphatase: 45 U/L (ref 38–126)
Anion gap: 7 (ref 5–15)
BUN: 11 mg/dL (ref 8–23)
CO2: 24 mmol/L (ref 22–32)
Calcium: 9 mg/dL (ref 8.9–10.3)
Chloride: 105 mmol/L (ref 98–111)
Creatinine: 1.18 mg/dL (ref 0.61–1.24)
GFR, Estimated: >60 mL/min (ref >60)
Glucose, Bld: 87 mg/dL (ref 70–99)
Potassium: 4.2 mmol/L (ref 3.5–5.1)
Sodium: 136 mmol/L (ref 135–145)
Total Bilirubin: 0.8 mg/dL (ref 0.3–1.2)
Total Protein: 6.3 g/dL — ABNORMAL LOW (ref 6.5–8.1)

## 2022-11-20 LAB — CBC WITH DIFFERENTIAL (CANCER CENTER ONLY)
Abs Immature Granulocytes: 0.01 10*3/uL (ref 0.00–0.07)
Basophils Absolute: 0.1 10*3/uL (ref 0.0–0.1)
Basophils Relative: 2 %
Eosinophils Absolute: 0.3 10*3/uL (ref 0.0–0.5)
Eosinophils Relative: 10 %
HCT: 42.7 % (ref 39.0–52.0)
Hemoglobin: 14.6 g/dL (ref 13.0–17.0)
Immature Granulocytes: 0 %
Lymphocytes Relative: 26 %
Lymphs Abs: 0.8 10*3/uL (ref 0.7–4.0)
MCH: 29.7 pg (ref 26.0–34.0)
MCHC: 34.2 g/dL (ref 30.0–36.0)
MCV: 86.8 fL (ref 80.0–100.0)
Monocytes Absolute: 0.3 10*3/uL (ref 0.1–1.0)
Monocytes Relative: 9 %
Neutro Abs: 1.7 10*3/uL (ref 1.7–7.7)
Neutrophils Relative %: 53 %
Platelet Count: 174 10*3/uL (ref 150–400)
RBC: 4.92 MIL/uL (ref 4.22–5.81)
RDW: 13.8 % (ref 11.5–15.5)
WBC Count: 3.2 10*3/uL — ABNORMAL LOW (ref 4.0–10.5)
nRBC: 0 % (ref 0.0–0.2)

## 2022-11-20 MED ORDER — DEXAMETHASONE SODIUM PHOSPHATE 10 MG/ML IJ SOLN
8.0000 mg | Freq: Once | INTRAMUSCULAR | Status: AC
  Administered 2022-11-20: 8 mg via INTRAVENOUS
  Filled 2022-11-20: qty 1

## 2022-11-20 MED ORDER — ACETAMINOPHEN 325 MG PO TABS
650.0000 mg | ORAL_TABLET | Freq: Once | ORAL | Status: AC
  Administered 2022-11-20: 650 mg via ORAL
  Filled 2022-11-20: qty 2

## 2022-11-20 MED ORDER — SODIUM CHLORIDE 0.9 % IV SOLN: Freq: Once | INTRAVENOUS | Status: AC

## 2022-11-20 MED ORDER — DEXTROSE 5 % IV SOLN
70.0000 mg/m2 | Freq: Once | INTRAVENOUS | Status: AC
  Administered 2022-11-20: 140 mg via INTRAVENOUS
  Filled 2022-11-20: qty 60

## 2022-11-20 MED ORDER — SODIUM CHLORIDE 0.9 % IV SOLN: Freq: Once | INTRAVENOUS | Status: DC

## 2022-11-20 MED ORDER — PALONOSETRON HCL INJECTION 0.25 MG/5ML
0.2500 mg | Freq: Once | INTRAVENOUS | Status: AC
  Administered 2022-11-20: 0.25 mg via INTRAVENOUS
  Filled 2022-11-20: qty 5

## 2022-11-20 MED ORDER — ZOLEDRONIC ACID 4 MG/100ML IV SOLN
4.0000 mg | Freq: Once | INTRAVENOUS | Status: AC
  Administered 2022-11-20: 4 mg via INTRAVENOUS
  Filled 2022-11-20: qty 100

## 2022-11-20 NOTE — Patient Instructions (Signed)
[facility]  Discharge Instructions: Thank you for choosing [facility] to provide your oncology and hematology care.   If you have a lab appointment with the [facility], please go directly to the [facility] and check in at the registration area.   Wear comfortable clothing and clothing appropriate for easy access to any Portacath or PICC line.   We strive to give you quality time with your provider. You may need to reschedule your appointment if you arrive late (15 or more minutes).  Arriving late affects you and other patients whose appointments are after yours.  Also, if you miss three or more appointments without notifying the office, you may be dismissed from the clinic at the provider's discretion.      For prescription refill requests, have your pharmacy contact our office and allow 72 hours for refills to be completed.    Today you received the following chemotherapy and/or immunotherapy agents: Kyprolis.       To help prevent nausea and vomiting after your treatment, we encourage you to take your nausea medication as directed.  BELOW ARE SYMPTOMS THAT SHOULD BE REPORTED IMMEDIATELY: *FEVER GREATER THAN 100.4 F (38 C) OR HIGHER *CHILLS OR SWEATING *NAUSEA AND VOMITING THAT IS NOT CONTROLLED WITH YOUR NAUSEA MEDICATION *UNUSUAL SHORTNESS OF BREATH *UNUSUAL BRUISING OR BLEEDING *URINARY PROBLEMS (pain or burning when urinating, or frequent urination) *BOWEL PROBLEMS (unusual diarrhea, constipation, pain near the anus) TENDERNESS IN MOUTH AND THROAT WITH OR WITHOUT PRESENCE OF ULCERS (sore throat, sores in mouth, or a toothache) UNUSUAL RASH, SWELLING OR PAIN  UNUSUAL VAGINAL DISCHARGE OR ITCHING   Items with * indicate a potential emergency and should be followed up as soon as possible or go to the Emergency Department if any problems should occur.  Please show the CHEMOTHERAPY ALERT CARD or IMMUNOTHERAPY ALERT CARD at  check-in to the Emergency Department and triage nurse.  Should you have questions after your visit or need to cancel or reschedule your appointment, please contact [facility]  Dept: [phone]  and follow the prompts.  Office hours are 8:00 a.m. to 4:30 p.m. Monday - Friday. Please note that voicemails left after 4:00 p.m. may not be returned until the following business day.  We are closed weekends and major holidays. You have access to a nurse at all times for urgent questions. Please call the main number to the clinic Dept: [phone] and follow the prompts.   For any non-urgent questions, you may also contact your provider using MyChart. We now offer e-Visits for anyone 56 and older to request care online for non-urgent symptoms. For details visit [email or web address].   Also download the MyChart app! Go to the app store, search "MyChart", open the app, select [facility], and log in with your MyChart username and password.  Zoledronic Acid Injection (Cancer) What is this medication? ZOLEDRONIC ACID (ZOE le dron ik AS id) treats high calcium levels in the blood caused by cancer. It may also be used with chemotherapy to treat weakened bones caused by cancer. It works by slowing down the release of calcium from bones. This lowers calcium levels in your blood. It also makes your bones stronger and less likely to break (fracture). It belongs to a group of medications called bisphosphonates. This medicine may be used for other purposes; ask your health care provider or pharmacist if you have questions. COMMON BRAND NAME(S): Zometa, Zometa Powder What  should I tell my care team before I take this medication? They need to know if you have any of these conditions: Dehydration Dental disease Kidney disease Liver disease Low levels of calcium in the blood Lung or breathing disease, such as asthma Receiving steroids, such as dexamethasone or prednisone An  unusual or allergic reaction to zoledronic acid, other medications, foods, dyes, or preservatives Pregnant or trying to get pregnant Breast-feeding How should I use this medication? This medication is injected into a vein. It is given by your care team in a hospital or clinic setting. Talk to your care team about the use of this medication in children. Special care may be needed. Overdosage: If you think you have taken too much of this medicine contact a poison control center or emergency room at once. NOTE: This medicine is only for you. Do not share this medicine with others. What if I miss a dose? Keep appointments for follow-up doses. It is important not to miss your dose. Call your care team if you are unable to keep an appointment. What may interact with this medication? Certain antibiotics given by injection Diuretics, such as bumetanide, furosemide NSAIDs, medications for pain and inflammation, such as ibuprofen or naproxen Teriparatide Thalidomide This list may not describe all possible interactions. Give your health care provider a list of all the medicines, herbs, non-prescription drugs, or dietary supplements you use. Also tell them if you smoke, drink alcohol, or use illegal drugs. Some items may interact with your medicine. What should I watch for while using this medication? Visit your care team for regular checks on your progress. It may be some time before you see the benefit from this medication. Some people who take this medication have severe bone, joint, or muscle pain. This medication may also increase your risk for jaw problems or a broken thigh bone. Tell your care team right away if you have severe pain in your jaw, bones, joints, or muscles. Tell you care team if you have any pain that does not go away or that gets worse. Tell your dentist and dental surgeon that you are taking this medication. You should not have major dental surgery while on this medication. See your  dentist to have a dental exam and fix any dental problems before starting this medication. Take good care of your teeth while on this medication. Make sure you see your dentist for regular follow-up appointments. You should make sure you get enough calcium and vitamin D while you are taking this medication. Discuss the foods you eat and the vitamins you take with your care team. Check with your care team if you have severe diarrhea, nausea, and vomiting, or if you sweat a lot. The loss of too much body fluid may make it dangerous for you to take this medication. You may need bloodwork while taking this medication. Talk to your care team if you wish to become pregnant or think you might be pregnant. This medication can cause serious birth defects. What side effects may I notice from receiving this medication? Side effects that you should report to your care team as soon as possible: Allergic reactions--skin rash, itching, hives, swelling of the face, lips, tongue, or throat Kidney injury--decrease in the amount of urine, swelling of the ankles, hands, or feet Low calcium level--muscle pain or cramps, confusion, tingling, or numbness in the hands or feet Osteonecrosis of the jaw--pain, swelling, or redness in the mouth, numbness of the jaw, poor healing after dental work, unusual  discharge from the mouth, visible bones in the mouth Severe bone, joint, or muscle pain Side effects that usually do not require medical attention (report to your care team if they continue or are bothersome): Constipation Fatigue Fever Loss of appetite Nausea Stomach pain This list may not describe all possible side effects. Call your doctor for medical advice about side effects. You may report side effects to FDA at 1-800-FDA-1088. Where should I keep my medication? This medication is given in a hospital or clinic. It will not be stored at home. NOTE: This sheet is a summary. It may not cover all possible information.  If you have questions about this medicine, talk to your doctor, pharmacist, or health care provider.  2023 Elsevier/Gold Standard (2007-09-26 00:00:00)

## 2022-11-22 ENCOUNTER — Other Ambulatory Visit: Payer: Self-pay | Admitting: Hematology

## 2022-11-22 ENCOUNTER — Encounter: Payer: Self-pay | Admitting: Hematology

## 2022-11-22 DIAGNOSIS — C9000 Multiple myeloma not having achieved remission: Secondary | ICD-10-CM

## 2022-11-22 DIAGNOSIS — Z7189 Other specified counseling: Secondary | ICD-10-CM

## 2022-11-22 MED ORDER — OXYCODONE HCL 5 MG PO TABS
5.0000 mg | ORAL_TABLET | ORAL | 0 refills | Status: DC | PRN
Start: 2022-11-22 — End: 2022-12-12

## 2022-11-25 DIAGNOSIS — H9313 Tinnitus, bilateral: Secondary | ICD-10-CM | POA: Diagnosis not present

## 2022-11-25 DIAGNOSIS — H903 Sensorineural hearing loss, bilateral: Secondary | ICD-10-CM | POA: Diagnosis not present

## 2022-11-25 LAB — MULTIPLE MYELOMA PANEL, SERUM
Albumin SerPl Elph-Mcnc: 3.9 g/dL (ref 2.9–4.4)
Albumin/Glob SerPl: 2.1 — ABNORMAL HIGH (ref 0.7–1.7)
Alpha 1: 0.2 g/dL (ref 0.0–0.4)
Alpha2 Glob SerPl Elph-Mcnc: 0.5 g/dL (ref 0.4–1.0)
B-Globulin SerPl Elph-Mcnc: 0.8 g/dL (ref 0.7–1.3)
Gamma Glob SerPl Elph-Mcnc: 0.4 g/dL (ref 0.4–1.8)
Globulin, Total: 1.9 g/dL — ABNORMAL LOW (ref 2.2–3.9)
IgA: 16 mg/dL — ABNORMAL LOW (ref 61–437)
IgG (Immunoglobin G), Serum: 390 mg/dL — ABNORMAL LOW (ref 603–1613)
IgM (Immunoglobulin M), Srm: 9 mg/dL — ABNORMAL LOW (ref 20–172)
M Protein SerPl Elph-Mcnc: 0.1 g/dL — ABNORMAL HIGH
Total Protein ELP: 5.8 g/dL — ABNORMAL LOW (ref 6.0–8.5)

## 2022-11-26 ENCOUNTER — Encounter: Payer: Self-pay | Admitting: Hematology

## 2022-12-03 ENCOUNTER — Other Ambulatory Visit: Payer: Self-pay | Admitting: Hematology

## 2022-12-03 DIAGNOSIS — C9000 Multiple myeloma not having achieved remission: Secondary | ICD-10-CM

## 2022-12-04 ENCOUNTER — Inpatient Hospital Stay: Payer: BC Managed Care – PPO

## 2022-12-04 ENCOUNTER — Encounter: Payer: Self-pay | Admitting: Hematology

## 2022-12-04 ENCOUNTER — Inpatient Hospital Stay (HOSPITAL_BASED_OUTPATIENT_CLINIC_OR_DEPARTMENT_OTHER): Payer: BC Managed Care – PPO | Admitting: Hematology

## 2022-12-04 ENCOUNTER — Other Ambulatory Visit: Payer: Self-pay

## 2022-12-04 VITALS — BP 126/72 | HR 63

## 2022-12-04 DIAGNOSIS — Z7189 Other specified counseling: Secondary | ICD-10-CM

## 2022-12-04 DIAGNOSIS — M545 Low back pain, unspecified: Secondary | ICD-10-CM | POA: Diagnosis not present

## 2022-12-04 DIAGNOSIS — I700 Atherosclerosis of aorta: Secondary | ICD-10-CM | POA: Diagnosis not present

## 2022-12-04 DIAGNOSIS — C9000 Multiple myeloma not having achieved remission: Secondary | ICD-10-CM | POA: Diagnosis not present

## 2022-12-04 DIAGNOSIS — Z87891 Personal history of nicotine dependence: Secondary | ICD-10-CM | POA: Diagnosis not present

## 2022-12-04 DIAGNOSIS — M25511 Pain in right shoulder: Secondary | ICD-10-CM | POA: Diagnosis not present

## 2022-12-04 DIAGNOSIS — Z7961 Long term (current) use of immunomodulator: Secondary | ICD-10-CM | POA: Diagnosis not present

## 2022-12-04 DIAGNOSIS — Z79891 Long term (current) use of opiate analgesic: Secondary | ICD-10-CM | POA: Diagnosis not present

## 2022-12-04 DIAGNOSIS — D3501 Benign neoplasm of right adrenal gland: Secondary | ICD-10-CM | POA: Diagnosis not present

## 2022-12-04 DIAGNOSIS — Z5112 Encounter for antineoplastic immunotherapy: Secondary | ICD-10-CM | POA: Diagnosis not present

## 2022-12-04 LAB — CBC WITH DIFFERENTIAL (CANCER CENTER ONLY)
Abs Immature Granulocytes: 0.01 10*3/uL (ref 0.00–0.07)
Basophils Absolute: 0 10*3/uL (ref 0.0–0.1)
Basophils Relative: 1 %
Eosinophils Absolute: 0.3 10*3/uL (ref 0.0–0.5)
Eosinophils Relative: 7 %
HCT: 43.5 % (ref 39.0–52.0)
Hemoglobin: 15 g/dL (ref 13.0–17.0)
Immature Granulocytes: 0 %
Lymphocytes Relative: 28 %
Lymphs Abs: 1.1 10*3/uL (ref 0.7–4.0)
MCH: 29.5 pg (ref 26.0–34.0)
MCHC: 34.5 g/dL (ref 30.0–36.0)
MCV: 85.6 fL (ref 80.0–100.0)
Monocytes Absolute: 0.6 10*3/uL (ref 0.1–1.0)
Monocytes Relative: 14 %
Neutro Abs: 2 10*3/uL (ref 1.7–7.7)
Neutrophils Relative %: 50 %
Platelet Count: 137 10*3/uL — ABNORMAL LOW (ref 150–400)
RBC: 5.08 MIL/uL (ref 4.22–5.81)
RDW: 13.9 % (ref 11.5–15.5)
WBC Count: 4 10*3/uL (ref 4.0–10.5)
nRBC: 0 % (ref 0.0–0.2)

## 2022-12-04 LAB — CMP (CANCER CENTER ONLY)
ALT: 12 U/L (ref 0–44)
AST: 12 U/L — ABNORMAL LOW (ref 15–41)
Albumin: 4.4 g/dL (ref 3.5–5.0)
Alkaline Phosphatase: 40 U/L (ref 38–126)
Anion gap: 7 (ref 5–15)
BUN: 15 mg/dL (ref 8–23)
CO2: 24 mmol/L (ref 22–32)
Calcium: 9.1 mg/dL (ref 8.9–10.3)
Chloride: 106 mmol/L (ref 98–111)
Creatinine: 0.88 mg/dL (ref 0.61–1.24)
GFR, Estimated: >60 mL/min (ref >60)
Glucose, Bld: 99 mg/dL (ref 70–99)
Potassium: 3.9 mmol/L (ref 3.5–5.1)
Sodium: 137 mmol/L (ref 135–145)
Total Bilirubin: 0.8 mg/dL (ref 0.3–1.2)
Total Protein: 6.3 g/dL — ABNORMAL LOW (ref 6.5–8.1)

## 2022-12-04 MED ORDER — SODIUM CHLORIDE 0.9 % IV SOLN: Freq: Once | INTRAVENOUS | Status: AC

## 2022-12-04 MED ORDER — PALONOSETRON HCL INJECTION 0.25 MG/5ML
0.2500 mg | Freq: Once | INTRAVENOUS | Status: AC
  Administered 2022-12-04: 0.25 mg via INTRAVENOUS
  Filled 2022-12-04: qty 5

## 2022-12-04 MED ORDER — DEXAMETHASONE SODIUM PHOSPHATE 10 MG/ML IJ SOLN
8.0000 mg | Freq: Once | INTRAMUSCULAR | Status: AC
  Administered 2022-12-04: 8 mg via INTRAVENOUS
  Filled 2022-12-04: qty 1

## 2022-12-04 MED ORDER — DEXTROSE 5 % IV SOLN
70.0000 mg/m2 | Freq: Once | INTRAVENOUS | Status: AC
  Administered 2022-12-04: 140 mg via INTRAVENOUS
  Filled 2022-12-04: qty 60

## 2022-12-04 MED ORDER — REVLIMID 15 MG PO CAPS
ORAL_CAPSULE | ORAL | 0 refills | Status: DC
Start: 2022-12-04 — End: 2022-12-30

## 2022-12-04 NOTE — Patient Instructions (Signed)
[facility]  Discharge Instructions: Thank you for choosing [facility] to provide your oncology and hematology care.   If you have a lab appointment with the Cancer Center, please go directly to the Cancer Center and check in at the registration area.   Wear comfortable clothing and clothing appropriate for easy access to any Portacath or PICC line.   We strive to give you quality time with your provider. You may need to reschedule your appointment if you arrive late (15 or more minutes).  Arriving late affects you and other patients whose appointments are after yours.  Also, if you miss three or more appointments without notifying the office, you may be dismissed from the clinic at the provider's discretion.      For prescription refill requests, have your pharmacy contact our office and allow 72 hours for refills to be completed.    Today you received the following chemotherapy and/or immunotherapy agents: Kyprolis.       To help prevent nausea and vomiting after your treatment, we encourage you to take your nausea medication as directed.  BELOW ARE SYMPTOMS THAT SHOULD BE REPORTED IMMEDIATELY: *FEVER GREATER THAN 100.4 F (38 C) OR HIGHER *CHILLS OR SWEATING *NAUSEA AND VOMITING THAT IS NOT CONTROLLED WITH YOUR NAUSEA MEDICATION *UNUSUAL SHORTNESS OF BREATH *UNUSUAL BRUISING OR BLEEDING *URINARY PROBLEMS (pain or burning when urinating, or frequent urination) *BOWEL PROBLEMS (unusual diarrhea, constipation, pain near the anus) TENDERNESS IN MOUTH AND THROAT WITH OR WITHOUT PRESENCE OF ULCERS (sore throat, sores in mouth, or a toothache) UNUSUAL RASH, SWELLING OR PAIN  UNUSUAL VAGINAL DISCHARGE OR ITCHING   Items with * indicate a potential emergency and should be followed up as soon as possible or go to the Emergency Department if any problems should occur.  Please show the CHEMOTHERAPY ALERT CARD or IMMUNOTHERAPY ALERT CARD at  check-in to the Emergency Department and triage nurse.  Should you have questions after your visit or need to cancel or reschedule your appointment, please contact [facility]  Dept: [phone]  and follow the prompts.  Office hours are 8:00 a.m. to 4:30 p.m. Monday - Friday. Please note that voicemails left after 4:00 p.m. may not be returned until the following business day.  We are closed weekends and major holidays. You have access to a nurse at all times for urgent questions. Please call the main number to the clinic Dept: [phone] and follow the prompts.   For any non-urgent questions, you may also contact your provider using MyChart. We now offer e-Visits for anyone 18 and older to request care online for non-urgent symptoms. For details visit [email or web address].   Also download the MyChart app! Go to the app store, search "MyChart", open the app, select [facility], and log in with your MyChart username and password.  Zoledronic Acid Injection (Cancer) What is this medication? ZOLEDRONIC ACID (ZOE le dron ik AS id) treats high calcium levels in the blood caused by cancer. It may also be used with chemotherapy to treat weakened bones caused by cancer. It works by slowing down the release of calcium from bones. This lowers calcium levels in your blood. It also makes your bones stronger and less likely to break (fracture). It belongs to a group of medications called bisphosphonates. This medicine may be used for other purposes; ask your health care provider or pharmacist if you have questions. COMMON BRAND NAME(S): Zometa, Zometa Powder What   should I tell my care team before I take this medication? They need to know if you have any of these conditions: Dehydration Dental disease Kidney disease Liver disease Low levels of calcium in the blood Lung or breathing disease, such as asthma Receiving steroids, such as dexamethasone or prednisone An  unusual or allergic reaction to zoledronic acid, other medications, foods, dyes, or preservatives Pregnant or trying to get pregnant Breast-feeding How should I use this medication? This medication is injected into a vein. It is given by your care team in a hospital or clinic setting. Talk to your care team about the use of this medication in children. Special care may be needed. Overdosage: If you think you have taken too much of this medicine contact a poison control center or emergency room at once. NOTE: This medicine is only for you. Do not share this medicine with others. What if I miss a dose? Keep appointments for follow-up doses. It is important not to miss your dose. Call your care team if you are unable to keep an appointment. What may interact with this medication? Certain antibiotics given by injection Diuretics, such as bumetanide, furosemide NSAIDs, medications for pain and inflammation, such as ibuprofen or naproxen Teriparatide Thalidomide This list may not describe all possible interactions. Give your health care provider a list of all the medicines, herbs, non-prescription drugs, or dietary supplements you use. Also tell them if you smoke, drink alcohol, or use illegal drugs. Some items may interact with your medicine. What should I watch for while using this medication? Visit your care team for regular checks on your progress. It may be some time before you see the benefit from this medication. Some people who take this medication have severe bone, joint, or muscle pain. This medication may also increase your risk for jaw problems or a broken thigh bone. Tell your care team right away if you have severe pain in your jaw, bones, joints, or muscles. Tell you care team if you have any pain that does not go away or that gets worse. Tell your dentist and dental surgeon that you are taking this medication. You should not have major dental surgery while on this medication. See your  dentist to have a dental exam and fix any dental problems before starting this medication. Take good care of your teeth while on this medication. Make sure you see your dentist for regular follow-up appointments. You should make sure you get enough calcium and vitamin D while you are taking this medication. Discuss the foods you eat and the vitamins you take with your care team. Check with your care team if you have severe diarrhea, nausea, and vomiting, or if you sweat a lot. The loss of too much body fluid may make it dangerous for you to take this medication. You may need bloodwork while taking this medication. Talk to your care team if you wish to become pregnant or think you might be pregnant. This medication can cause serious birth defects. What side effects may I notice from receiving this medication? Side effects that you should report to your care team as soon as possible: Allergic reactions--skin rash, itching, hives, swelling of the face, lips, tongue, or throat Kidney injury--decrease in the amount of urine, swelling of the ankles, hands, or feet Low calcium level--muscle pain or cramps, confusion, tingling, or numbness in the hands or feet Osteonecrosis of the jaw--pain, swelling, or redness in the mouth, numbness of the jaw, poor healing after dental work, unusual   discharge from the mouth, visible bones in the mouth Severe bone, joint, or muscle pain Side effects that usually do not require medical attention (report to your care team if they continue or are bothersome): Constipation Fatigue Fever Loss of appetite Nausea Stomach pain This list may not describe all possible side effects. Call your doctor for medical advice about side effects. You may report side effects to FDA at 1-800-FDA-1088. Where should I keep my medication? This medication is given in a hospital or clinic. It will not be stored at home. NOTE: This sheet is a summary. It may not cover all possible information.  If you have questions about this medicine, talk to your doctor, pharmacist, or health care provider.  2023 Elsevier/Gold Standard (2007-09-26 00:00:00)  

## 2022-12-04 NOTE — Progress Notes (Signed)
Patient seen by Dr. Kale  Vitals are within treatment parameters.  Labs reviewed: and are within treatment parameters.  Per physician team, patient is ready for treatment and there are NO modifications to the treatment plan.  

## 2022-12-04 NOTE — Progress Notes (Signed)
HEMATOLOGY/ONCOLOGY CLINIC NOTE  Date of Service: 12/04/22   Patient Care Team: Milus Height, [place] as PCP - General (Nurse Practitioner)  CHIEF COMPLAINTS/PURPOSE OF CONSULTATION:  Follow-up for continued evaluation and management of multiple myeloma  HISTORY OF PRESENTING ILLNESS:   Anthony [name] is a wonderful 62 y.o. male who has been referred to [place] by Dr Milus Height, PA for evaluation and management of newly diagnosed multiple myeloma. He reports He is doing well.  He reports persistent lower back pain that he has had since he was in his 20's. He notes no previous significant injuries. He further notes that he gets chiropractic adjustments and takes Tylenol to manage his symptoms. He notes less back pain when standing up on his right leg and maintaining weight on his right leg.  He reports previous history of smoking and chewing tobacco over 40 years ago.  He had a recent fall back in February of this year with only a pulled muscle. And he notes another fall when chasing his cat where he fell on his right shoulder. He reports pain in right shoulder.   He had a recent COVID-19 infection back in March.  He reports intermittent cough with SOB. He notes he takes 2 Rolaids at night. We discussed potentially trying antacids which he is agreeable to as he will begin steroids soon and was advised of the possible symptoms and side effects from taking steroids.  We discussed CRAB criteria and that he meets at least two of the criterion being  anemia and bone disease.  We discussed getting additional scans for further evaluation which he is agreeable to.  We further discussed starting Daratumumab/Velcade/Dexamethasone/Zometa and Revlimid for treatment which he is agreeable to. We also discussed starting steroids before treatment and taking an acid suppressant which he was also agreeable to.  We discussed getting Senna and taking it as needed for constipation.  Labs done today  were reviewed in detail.  We discussed his recent bone marrow biopsy and aspiration done 02/06/2022.  We discussed PET/CT scan done 01/28/2022.  We discussed CT right shoulder w/o contrast done 11/13/2021  INTERVAL HISTORY:  Anthony [name] is a 62 y.o. male, is here for continued evaluation of and management of multiple myeloma. Patient was last seen by me on 11/06/2022 and complained of frequent nausea and vomiting on one occasion. He also complained of sleeping difficulties, coughing, and worsened back pain. Patient is scheduled to receive cycle 9 day 15 of his treatment today.  Today, he is accompanied by his wife. Patient is planning on traveling to [place] soon. He reports that he has been feeling well overall, though he has felt quite poorly over the last week. He does complain of knee issues.  Patient reports that he has been able to hold down what he consumes, but his appetite has decreased from baseline. Patient does note a 5-pound weight loss since his last visit and weighs 192 pounds currently.  Patient does reports a bump behind left thigh which may be infection/ingrown hair related. Patient does not believe it is friction-related. He reports that 5-10 years ago, he was hospitalized for an infection on his leg, which was not a MRSA infection.   Patient does complain of worsened back pain which he attributes to activity. He does note that he is currently renovating his home and does lift boxes frequently.  Patient does take Oxycodone and Methocarbamol for back pain. He does report that he did try to hold off on  taking each separately for a few days, but did not notice any difference in pain.  MEDICAL HISTORY:  Past Medical History:  Diagnosis Date   Back pain    Cancer (HCC)    Multiple myeloma (HCC) 02/15/2022    SURGICAL HISTORY: Past Surgical History:  Procedure Laterality Date   IR BONE TUMOR(S)RF ABLATION  05/07/2022   IR BONE TUMOR(S)RF ABLATION  05/07/2022   IR  BONE TUMOR(S)RF ABLATION  05/07/2022   IR KYPHO EA ADDL LEVEL THORACIC OR LUMBAR  05/07/2022   IR KYPHO LUMBAR INC FX REDUCE BONE BX UNI/BIL CANNULATION INC/IMAGING  05/07/2022   IR KYPHO THORACIC WITH BONE BIOPSY  05/07/2022   IR RADIOLOGIST EVAL & MGMT  04/23/2022   IR RADIOLOGIST EVAL & MGMT  05/17/2022   IR RADIOLOGIST EVAL & MGMT  06/06/2022    SOCIAL HISTORY: Social History   Socioeconomic History   Marital status: Married    Spouse name: Not on file   Number of children: Not on file   Years of education: Not on file   Highest education level: Not on file  Occupational History   Not on file  Tobacco Use   Smoking status: Never   Smokeless tobacco: Former    Types: Chew, Snuff  Vaping Use   Vaping Use: Never used  Substance and Sexual Activity   Alcohol use: Yes    Alcohol/week: 2.0 standard drinks of alcohol    Types: 2 Glasses of wine per week   Drug use: No   Sexual activity: Yes  Other Topics Concern   Not on file  Social History Narrative   Not on file   Social Determinants of Health   Financial Resource Strain: Low Risk  (05/07/2022)   Overall Financial Resource Strain (CARDIA)    Difficulty of Paying Living Expenses: Not very hard  Food Insecurity: No Food Insecurity (05/07/2022)   Hunger Vital Sign    Worried About Running Out of Food in the Last Year: Never true    Ran Out of Food in the Last Year: Never true  Transportation Needs: No Transportation Needs (05/07/2022)   PRAPARE - Administrator, Civil Service (Medical): No    Lack of Transportation (Non-Medical): No  Physical Activity: Not on file  Stress: Not on file  Social Connections: Not on file  Intimate Partner Violence: Unknown (05/07/2022)   Humiliation, Afraid, Rape, and Kick questionnaire    Fear of Current or Ex-Partner: No    Emotionally Abused: No    Physically Abused: No    Sexually Abused: Not on file    FAMILY HISTORY: Family History  Problem Relation Age of Onset   Rheum  arthritis Mother    Other Sister        Pre-cancerous uterine mass;    Rheum arthritis Sister    Bone cancer Paternal Grandfather    Brain cancer Maternal Uncle    Brain cancer Maternal Aunt    Osteoporosis Neg Hx     ALLERGIES:  has No Known Allergies.  MEDICATIONS:  Current Outpatient Medications  Medication Sig Dispense Refill   REVLIMID 15 MG capsule TAKE 1 CAPSULE (15 mg) DAILY FOR 21 DAYS ON THEN 7 DAYS OFF 21 capsule 0   acyclovir (ZOVIRAX) 400 MG tablet Take 1 tablet (400 mg total) by mouth 2 (two) times daily. 60 tablet 11   aluminum chloride (DRYSOL) 20 % external solution Apply 1 application topically at bedtime.     amoxicillin-clavulanate (AUGMENTIN) 875-125 MG  tablet Take 1 tablet by mouth 2 (two) times daily.     calcium-vitamin D (OSCAL WITH D) 250-125 MG-UNIT tablet Take 1 tablet by mouth daily.     cyclobenzaprine (FLEXERIL) 10 MG tablet Take 10 mg by mouth 3 (three) times daily as needed for muscle spasms.     diclofenac sodium (VOLTAREN) 1 % GEL Apply 2 g topically as needed.     dronabinol (MARINOL) 5 MG capsule Take 1 capsule (5 mg total) by mouth 2 (two) times daily before a meal. 60 capsule 0   ELIQUIS 5 MG TABS tablet Take 1 tablet (5 mg total) by mouth 2 (two) times daily. 60 tablet 5   ergocalciferol (VITAMIN D2) 1.25 MG (50000 UT) capsule Take 1 capsule (50,000 Units total) by mouth 2 (two) times daily. 12 capsule 2   esomeprazole (NEXIUM) 40 MG capsule TAKE ONE CAPSULE BY MOUTH DAILY BEFORE BREAKFAST 30 capsule 1   fentaNYL (DURAGESIC) 25 MCG/HR Place 1 patch onto the skin every 3 (three) days. 10 patch 0   furosemide (LASIX) 20 MG tablet Take 1 tablet (20 mg total) by mouth daily. 30 tablet 1   lidocaine (LIDODERM) 5 % Place 1 patch onto the skin daily. Remove & Discard patch within 12 hours or as directed by MD 30 patch 0   methocarbamol (ROBAXIN) 500 MG tablet Take 1 tablet (500 mg total) by mouth every 8 (eight) hours as needed for muscle spasms. 30  tablet 0   methylPREDNISolone (MEDROL DOSEPAK) 4 MG TBPK tablet Take 6 pills by mouth day 1, 5 on day 2, 4 on day 3, 3 on day 4, 2 on day 5, 1 on day 6 21 tablet 0   Multiple Vitamin (MULTIVITAMIN) tablet Take 1 tablet by mouth daily.     Omega-3 Fatty Acids (FISH OIL) 1360 MG CAPS Take 1 capsule by mouth daily.     ondansetron (ZOFRAN) 8 MG tablet Take 1 tablet (8 mg total) by mouth every 8 (eight) hours as needed for nausea or vomiting. 30 tablet 0   oxyCODONE (ROXICODONE) 5 MG immediate release tablet Take 1 tablet (5 mg total) by mouth every 4 (four) hours as needed for severe pain or moderate pain. Take 1 to 2 tablets every 4 hours as needed for moderate to severe pain. 60 tablet 0   prochlorperazine (COMPAZINE) 10 MG tablet TAKE ONE TABLET BY MOUTH EVERY 6 HOURS AS NEEDED FOR NAUSEA AND VOMITING 30 tablet 1   senna-docusate (SENNA S) 8.6-50 MG tablet Take 2 tablets by mouth at bedtime. 60 tablet 1   terbinafine (LAMISIL) 1 % cream Apply 1 application topically 2 (two) times daily.     No current facility-administered medications for this visit.   REVIEW OF SYSTEMS:  10 Point review of Systems was done is negative except as noted above.   PHYSICAL EXAMINATION: .BP (!) 142/88 (BP Location: Left Arm, Patient Position: Sitting) Comment: nurse notified  Pulse 64   Temp (!) 97.5 F (36.4 C) (Temporal)   Resp 16   Ht 5\' 7"  (1.702 m)   Wt 192 lb 4.8 oz (87.2 kg)   SpO2 100%   BMI 30.12 kg/m   GENERAL:alert, in no acute distress and comfortable SKIN: no acute rashes, no significant lesions EYES: conjunctiva are pink and non-injected, sclera anicteric OROPHARYNX: MMM, no exudates, no oropharyngeal erythema or ulceration NECK: supple, no JVD LYMPH:  no palpable lymphadenopathy in the cervical, axillary or inguinal regions LUNGS: clear to auscultation b/l with normal  respiratory effort HEART: regular rate & rhythm ABDOMEN:  normoactive bowel sounds , non tender, not  distended. Extremity: no pedal edema PSYCH: alert & oriented x 3 with fluent speech NEURO: no focal motor/sensory deficits   LABORATORY DATA:  I have reviewed the data as listed .    Latest Ref Rng & Units 12/04/2022   11:11 AM 11/20/2022    9:19 AM 11/06/2022   10:28 AM  CBC  WBC 4.0 - 10.5 K/uL 4.0  3.2  3.8   Hemoglobin 13.0 - 17.0 g/dL 16.1  09.6  04.5   Hematocrit 39.0 - 52.0 % 43.5  42.7  44.2   Platelets 150 - 400 K/uL 137  174  140       Latest Ref Rng & Units 12/04/2022   11:11 AM 11/20/2022    9:19 AM 11/06/2022   10:28 AM  CMP  Glucose 70 - 99 mg/dL 99  87  409   BUN 8 - 23 mg/dL Creatinine 0.61 - 1.24 mg/dL 8.11  9.14  7.82   Sodium 135 - 145 mmol/L 137  136  138   Potassium 3.5 - 5.1 mmol/L 3.9  4.2  4.1   Chloride 98 - 111 mmol/L 106  105  107   CO2 22 - 32 mmol/L Calcium 8.9 - 10.3 mg/dL 9.1  9.0  9.0   Total Protein 6.5 - 8.1 g/dL 6.3  6.3  6.2   Total Bilirubin 0.3 - 1.2 mg/dL 0.8  0.8  0.9   Alkaline Phos 38 - 126 U/L 40  45  51   AST 15 - 41 U/L ALT 0 - 44 U/L PATHOLOGY Surgical Pathology CASE: [id number] PATIENT: Raheem Joung Bone Marrow Report     Clinical History: Multiple Myeloma     DIAGNOSIS:  BONE MARROW, ASPIRATE, CLOT, CORE: -Hypercellular bone marrow (60%) with erythroid predominant trilineage hematopoiesis and no evidence of residual plasma cell neoplasm (less than 1% plasma cells by manual aspirate differential, and CD138 immunohistochemical analysis of clot and core sections).  PERIPHERAL BLOOD: -Leukopenia  MICROSCOPIC DESCRIPTION:  PERIPHERAL BLOOD SMEAR: Platelets: Adequate in number, no platelet clumps identified Erythroid: Normocytic normochromic red blood cells Leukocytes: Mild leukopenia, negative for dysplastic granulocytes, blasts or  plasma cells  BONE MARROW ASPIRATE: Cellular Erythroid precursors: Relatively increased, show a full sequence  of maturation with occasional abnormal forms (including binucleate forms, megaloblastoid change and nuclear membrane irregularities) Granulocytic precursors: Decreased, show a full sequence of generally orderly maturation Megakaryocytes: Normal in number and morphology Lymphocytes/plasma cells: Not increased   02/06/2022 Molecular pathology   RADIOGRAPHIC STUDIES: I have personally reviewed the radiological images as listed and agreed with the findings in the report. No results found.   ASSESSMENT & PLAN:   62 y.o. very pleasant male with  1. R-ISS stage III multiple myeloma with extensive bone metastases and patholgiic fracture rt shoulder -Recent bone marrow biopsy and aspiration done 02/06/2022 revealed hypercellular bone marrow with plasma cell neoplasm. 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  Plan:  -Discussed lab results on 12/04/2022 with patient in detail. CBC showed WBC of 4.0K, hemoglobin of 15.0, and platelets of 137K. -mild thrombocytopenia, otherwise stable -CMP normal -Last myeloma lab 11/20/2022 revealed stable M spike of 0.1  -educated patient on the details of the monoclonal protein -IgA  lambda protein is not increasing at this time -Small amount of M-protein present is IgG kappa -Advised patient to regularly consume more palliative foods such as fresh fruits and vegetables, and yogurts -answered all of patient's questions in detail -based on labs, patient's back pain is likely not related to myeloma progression -reccommended patient to engage in low-impact activities to continue to strengthen back muscles such as water aerobics -Reccommended patient to use topical antibiotics and warm compresses to treat infection in LE -answered patient's and his wife's questions regarding methocarbamol intake -Continue maintenance Carfilzomib every 2 weeks and maintenance Revlimid  -May lower Revlimid dose to 10 MG if  patient's labs continue to be stable -no clinical sign or evidence of multiple myeloma progression at this time   FOLLOW-UP: Per integrated scheduling MD visit in 8 weeks  The total time spent in the appointment was 32 minutes* .  All of the patient's questions were answered with apparent satisfaction. The patient knows to call the clinic with any problems, questions or concerns.   Wyvonnia Lora MD MS AAHIVMS [facility] Hematology/Oncology Physician [facility]  .*Total Encounter Time as defined by the Centers for Medicare and Medicaid Services includes, in addition to the face-to-face time of a patient visit (documented in the note above) non-face-to-face time: obtaining and reviewing outside history, ordering and reviewing medications, tests or procedures, care coordination (communications with other health care professionals or caregivers) and documentation in the medical record.    I,Mitra Faeizi,acting as a Neurosurgeon for Wyvonnia Lora, MD.,have documented all relevant documentation on the behalf of Wyvonnia Lora, MD,as directed by  Wyvonnia Lora, MD while in the presence of Wyvonnia Lora, MD.  .I have reviewed the above documentation for accuracy and completeness, and I agree with the above. Johney Maine MD

## 2022-12-09 ENCOUNTER — Other Ambulatory Visit: Payer: Self-pay | Admitting: Hematology

## 2022-12-09 DIAGNOSIS — C9000 Multiple myeloma not having achieved remission: Secondary | ICD-10-CM

## 2022-12-10 ENCOUNTER — Encounter: Payer: Self-pay | Admitting: Hematology

## 2022-12-10 ENCOUNTER — Other Ambulatory Visit: Payer: Self-pay

## 2022-12-10 MED ORDER — METHOCARBAMOL 500 MG PO TABS: 500.0000 mg | ORAL_TABLET | Freq: Three times a day (TID) | ORAL | 0 refills | Status: DC | PRN

## 2022-12-11 ENCOUNTER — Other Ambulatory Visit: Payer: Self-pay | Admitting: Hematology

## 2022-12-11 ENCOUNTER — Other Ambulatory Visit: Payer: Self-pay | Admitting: Physician Assistant

## 2022-12-11 DIAGNOSIS — C9000 Multiple myeloma not having achieved remission: Secondary | ICD-10-CM

## 2022-12-12 ENCOUNTER — Other Ambulatory Visit: Payer: Self-pay | Admitting: Hematology

## 2022-12-12 ENCOUNTER — Other Ambulatory Visit: Payer: Self-pay | Admitting: Physician Assistant

## 2022-12-12 ENCOUNTER — Encounter: Payer: Self-pay | Admitting: Hematology

## 2022-12-12 DIAGNOSIS — C9000 Multiple myeloma not having achieved remission: Secondary | ICD-10-CM

## 2022-12-12 MED ORDER — FENTANYL 25 MCG/HR TD PT72
1.0000 | MEDICATED_PATCH | TRANSDERMAL | 0 refills | Status: DC
Start: 2022-12-12 — End: 2022-12-30

## 2022-12-12 MED ORDER — DRONABINOL 5 MG PO CAPS
5.0000 mg | ORAL_CAPSULE | Freq: Two times a day (BID) | ORAL | 0 refills | Status: DC
Start: 2022-12-12 — End: 2023-02-04

## 2022-12-16 ENCOUNTER — Encounter: Payer: Self-pay | Admitting: Hematology

## 2022-12-16 MED ORDER — OXYCODONE HCL 5 MG PO TABS
5.0000 mg | ORAL_TABLET | ORAL | 0 refills | Status: DC | PRN
Start: 2022-12-16 — End: 2022-12-30

## 2022-12-18 ENCOUNTER — Inpatient Hospital Stay: Payer: No Typology Code available for payment source | Attending: Physician Assistant

## 2022-12-18 ENCOUNTER — Inpatient Hospital Stay: Payer: No Typology Code available for payment source

## 2022-12-18 VITALS — BP 135/97 | HR 72 | Temp 98.1°F | Resp 17 | Wt 190.0 lb

## 2022-12-18 DIAGNOSIS — R0602 Shortness of breath: Secondary | ICD-10-CM | POA: Insufficient documentation

## 2022-12-18 DIAGNOSIS — G8929 Other chronic pain: Secondary | ICD-10-CM | POA: Diagnosis not present

## 2022-12-18 DIAGNOSIS — R634 Abnormal weight loss: Secondary | ICD-10-CM | POA: Diagnosis not present

## 2022-12-18 DIAGNOSIS — R059 Cough, unspecified: Secondary | ICD-10-CM | POA: Diagnosis not present

## 2022-12-18 DIAGNOSIS — Z7189 Other specified counseling: Secondary | ICD-10-CM

## 2022-12-18 DIAGNOSIS — M545 Low back pain, unspecified: Secondary | ICD-10-CM | POA: Diagnosis not present

## 2022-12-18 DIAGNOSIS — Z87891 Personal history of nicotine dependence: Secondary | ICD-10-CM | POA: Diagnosis not present

## 2022-12-18 DIAGNOSIS — C9000 Multiple myeloma not having achieved remission: Secondary | ICD-10-CM | POA: Diagnosis not present

## 2022-12-18 DIAGNOSIS — M25511 Pain in right shoulder: Secondary | ICD-10-CM | POA: Insufficient documentation

## 2022-12-18 DIAGNOSIS — Z5112 Encounter for antineoplastic immunotherapy: Secondary | ICD-10-CM | POA: Insufficient documentation

## 2022-12-18 LAB — CBC WITH DIFFERENTIAL (CANCER CENTER ONLY)
Abs Immature Granulocytes: 0 10*3/uL (ref 0.00–0.07)
Basophils Absolute: 0 10*3/uL (ref 0.0–0.1)
Basophils Relative: 2 %
Eosinophils Absolute: 0.3 10*3/uL (ref 0.0–0.5)
Eosinophils Relative: 10 %
HCT: 43.4 % (ref 39.0–52.0)
Hemoglobin: 14.6 g/dL (ref 13.0–17.0)
Immature Granulocytes: 0 %
Lymphocytes Relative: 34 %
Lymphs Abs: 0.9 10*3/uL (ref 0.7–4.0)
MCH: 29.4 pg (ref 26.0–34.0)
MCHC: 33.6 g/dL (ref 30.0–36.0)
MCV: 87.3 fL (ref 80.0–100.0)
Monocytes Absolute: 0.2 10*3/uL (ref 0.1–1.0)
Monocytes Relative: 8 %
Neutro Abs: 1.3 10*3/uL — ABNORMAL LOW (ref 1.7–7.7)
Neutrophils Relative %: 46 %
Platelet Count: 174 10*3/uL (ref 150–400)
RBC: 4.97 MIL/uL (ref 4.22–5.81)
RDW: 13.9 % (ref 11.5–15.5)
WBC Count: 2.7 10*3/uL — ABNORMAL LOW (ref 4.0–10.5)
nRBC: 0 % (ref 0.0–0.2)

## 2022-12-18 LAB — CMP (CANCER CENTER ONLY)
ALT: 10 U/L (ref 0–44)
AST: 12 U/L — ABNORMAL LOW (ref 15–41)
Albumin: 4.1 g/dL (ref 3.5–5.0)
Alkaline Phosphatase: 43 U/L (ref 38–126)
Anion gap: 8 (ref 5–15)
BUN: 13 mg/dL (ref 8–23)
CO2: 25 mmol/L (ref 22–32)
Calcium: 8.8 mg/dL — ABNORMAL LOW (ref 8.9–10.3)
Chloride: 105 mmol/L (ref 98–111)
Creatinine: 0.91 mg/dL (ref 0.61–1.24)
GFR, Estimated: >60 mL/min (ref >60)
Glucose, Bld: 125 mg/dL — ABNORMAL HIGH (ref 70–99)
Potassium: 4.2 mmol/L (ref 3.5–5.1)
Sodium: 138 mmol/L (ref 135–145)
Total Bilirubin: 0.7 mg/dL (ref 0.3–1.2)
Total Protein: 5.9 g/dL — ABNORMAL LOW (ref 6.5–8.1)

## 2022-12-18 MED ORDER — SODIUM CHLORIDE 0.9 % IV SOLN: Freq: Once | INTRAVENOUS | Status: AC

## 2022-12-18 MED ORDER — ACETAMINOPHEN 325 MG PO TABS
650.0000 mg | ORAL_TABLET | Freq: Once | ORAL | Status: AC
  Administered 2022-12-18: 650 mg via ORAL
  Filled 2022-12-18: qty 2

## 2022-12-18 MED ORDER — PALONOSETRON HCL INJECTION 0.25 MG/5ML
0.2500 mg | Freq: Once | INTRAVENOUS | Status: AC
  Administered 2022-12-18: 0.25 mg via INTRAVENOUS
  Filled 2022-12-18: qty 5

## 2022-12-18 MED ORDER — DEXAMETHASONE SODIUM PHOSPHATE 10 MG/ML IJ SOLN
8.0000 mg | Freq: Once | INTRAMUSCULAR | Status: AC
  Administered 2022-12-18: 8 mg via INTRAVENOUS
  Filled 2022-12-18: qty 1

## 2022-12-18 MED ORDER — DEXTROSE 5 % IV SOLN
70.0000 mg/m2 | Freq: Once | INTRAVENOUS | Status: AC
  Administered 2022-12-18: 140 mg via INTRAVENOUS
  Filled 2022-12-18: qty 60

## 2022-12-18 NOTE — Patient Instructions (Signed)
[facility]  Discharge Instructions: Thank you for choosing [facility] to provide your oncology and hematology care.   If you have a lab appointment with the Cancer Center, please go directly to the Cancer Center and check in at the registration area.   Wear comfortable clothing and clothing appropriate for easy access to any Portacath or PICC line.   We strive to give you quality time with your provider. You may need to reschedule your appointment if you arrive late (15 or more minutes).  Arriving late affects you and other patients whose appointments are after yours.  Also, if you miss three or more appointments without notifying the office, you may be dismissed from the clinic at the provider's discretion.      For prescription refill requests, have your pharmacy contact our office and allow 72 hours for refills to be completed.    Today you received the following chemotherapy and/or immunotherapy agents :  Kyprolis   To help prevent nausea and vomiting after your treatment, we encourage you to take your nausea medication as directed.  BELOW ARE SYMPTOMS THAT SHOULD BE REPORTED IMMEDIATELY: *FEVER GREATER THAN 100.4 F (38 C) OR HIGHER *CHILLS OR SWEATING *NAUSEA AND VOMITING THAT IS NOT CONTROLLED WITH YOUR NAUSEA MEDICATION *UNUSUAL SHORTNESS OF BREATH *UNUSUAL BRUISING OR BLEEDING *URINARY PROBLEMS (pain or burning when urinating, or frequent urination) *BOWEL PROBLEMS (unusual diarrhea, constipation, pain near the anus) TENDERNESS IN MOUTH AND THROAT WITH OR WITHOUT PRESENCE OF ULCERS (sore throat, sores in mouth, or a toothache) UNUSUAL RASH, SWELLING OR PAIN  UNUSUAL VAGINAL DISCHARGE OR ITCHING   Items with * indicate a potential emergency and should be followed up as soon as possible or go to the Emergency Department if any problems should occur.  Please show the CHEMOTHERAPY ALERT CARD or IMMUNOTHERAPY ALERT CARD at  check-in to the Emergency Department and triage nurse.  Should you have questions after your visit or need to cancel or reschedule your appointment, please contact [facility]  Dept: [phone]  and follow the prompts.  Office hours are 8:00 a.m. to 4:30 p.m. Monday - Friday. Please note that voicemails left after 4:00 p.m. may not be returned until the following business day.  We are closed weekends and major holidays. You have access to a nurse at all times for urgent questions. Please call the main number to the clinic Dept: [phone] and follow the prompts.   For any non-urgent questions, you may also contact your provider using MyChart. We now offer e-Visits for anyone 18 and older to request care online for non-urgent symptoms. For details visit [email or web address].   Also download the MyChart app! Go to the app store, search "MyChart", open the app, select [facility], and log in with your MyChart username and password.   

## 2022-12-18 NOTE — Progress Notes (Signed)
Okay to treat with ANC 1.3 per Dr. Kale 

## 2022-12-18 NOTE — Progress Notes (Signed)
Per Dr Candise Che: pt is ok for tx today with ANC of 1.3

## 2022-12-19 ENCOUNTER — Other Ambulatory Visit: Payer: Self-pay

## 2022-12-19 ENCOUNTER — Other Ambulatory Visit: Payer: Self-pay | Admitting: Hematology

## 2022-12-19 DIAGNOSIS — C9000 Multiple myeloma not having achieved remission: Secondary | ICD-10-CM

## 2022-12-23 ENCOUNTER — Encounter: Payer: Self-pay | Admitting: Hematology

## 2022-12-23 LAB — MULTIPLE MYELOMA PANEL, SERUM
Albumin SerPl Elph-Mcnc: 3.8 g/dL (ref 2.9–4.4)
Albumin/Glob SerPl: 2.1 — ABNORMAL HIGH (ref 0.7–1.7)
Alpha 1: 0.2 g/dL (ref 0.0–0.4)
Alpha2 Glob SerPl Elph-Mcnc: 0.5 g/dL (ref 0.4–1.0)
B-Globulin SerPl Elph-Mcnc: 0.8 g/dL (ref 0.7–1.3)
Gamma Glob SerPl Elph-Mcnc: 0.3 g/dL — ABNORMAL LOW (ref 0.4–1.8)
Globulin, Total: 1.9 g/dL — ABNORMAL LOW (ref 2.2–3.9)
IgA: 17 mg/dL — ABNORMAL LOW (ref 61–437)
IgG (Immunoglobin G), Serum: 393 mg/dL — ABNORMAL LOW (ref 603–1613)
IgM (Immunoglobulin M), Srm: 10 mg/dL — ABNORMAL LOW (ref 20–172)
Total Protein ELP: 5.7 g/dL — ABNORMAL LOW (ref 6.0–8.5)

## 2022-12-30 ENCOUNTER — Other Ambulatory Visit: Payer: Self-pay | Admitting: Hematology

## 2022-12-30 DIAGNOSIS — C9000 Multiple myeloma not having achieved remission: Secondary | ICD-10-CM

## 2022-12-31 ENCOUNTER — Inpatient Hospital Stay (HOSPITAL_BASED_OUTPATIENT_CLINIC_OR_DEPARTMENT_OTHER): Payer: No Typology Code available for payment source | Admitting: Hematology

## 2022-12-31 ENCOUNTER — Encounter: Payer: Self-pay | Admitting: Hematology

## 2022-12-31 ENCOUNTER — Inpatient Hospital Stay: Payer: No Typology Code available for payment source

## 2022-12-31 DIAGNOSIS — M545 Low back pain, unspecified: Secondary | ICD-10-CM | POA: Diagnosis not present

## 2022-12-31 DIAGNOSIS — G8929 Other chronic pain: Secondary | ICD-10-CM | POA: Diagnosis not present

## 2022-12-31 DIAGNOSIS — Z7189 Other specified counseling: Secondary | ICD-10-CM

## 2022-12-31 DIAGNOSIS — C9000 Multiple myeloma not having achieved remission: Secondary | ICD-10-CM

## 2022-12-31 DIAGNOSIS — Z5112 Encounter for antineoplastic immunotherapy: Secondary | ICD-10-CM | POA: Diagnosis not present

## 2022-12-31 DIAGNOSIS — R059 Cough, unspecified: Secondary | ICD-10-CM | POA: Diagnosis not present

## 2022-12-31 DIAGNOSIS — Z87891 Personal history of nicotine dependence: Secondary | ICD-10-CM | POA: Diagnosis not present

## 2022-12-31 DIAGNOSIS — M25511 Pain in right shoulder: Secondary | ICD-10-CM | POA: Diagnosis not present

## 2022-12-31 DIAGNOSIS — R634 Abnormal weight loss: Secondary | ICD-10-CM | POA: Diagnosis not present

## 2022-12-31 DIAGNOSIS — R0602 Shortness of breath: Secondary | ICD-10-CM | POA: Diagnosis not present

## 2022-12-31 LAB — CBC WITH DIFFERENTIAL (CANCER CENTER ONLY)
Abs Immature Granulocytes: 0.01 10*3/uL (ref 0.00–0.07)
Basophils Absolute: 0 10*3/uL (ref 0.0–0.1)
Basophils Relative: 1 %
Eosinophils Absolute: 0.2 10*3/uL (ref 0.0–0.5)
Eosinophils Relative: 5 %
HCT: 43 % (ref 39.0–52.0)
Hemoglobin: 14.6 g/dL (ref 13.0–17.0)
Immature Granulocytes: 0 %
Lymphocytes Relative: 16 %
Lymphs Abs: 0.8 10*3/uL (ref 0.7–4.0)
MCH: 29.1 pg (ref 26.0–34.0)
MCHC: 34 g/dL (ref 30.0–36.0)
MCV: 85.7 fL (ref 80.0–100.0)
Monocytes Absolute: 0.7 10*3/uL (ref 0.1–1.0)
Monocytes Relative: 15 %
Neutro Abs: 2.9 10*3/uL (ref 1.7–7.7)
Neutrophils Relative %: 63 %
Platelet Count: 134 10*3/uL — ABNORMAL LOW (ref 150–400)
RBC: 5.02 MIL/uL (ref 4.22–5.81)
RDW: 13.7 % (ref 11.5–15.5)
WBC Count: 4.6 10*3/uL (ref 4.0–10.5)
nRBC: 0 % (ref 0.0–0.2)

## 2022-12-31 LAB — CMP (CANCER CENTER ONLY)
ALT: 14 U/L (ref 0–44)
AST: 12 U/L — ABNORMAL LOW (ref 15–41)
Albumin: 4.2 g/dL (ref 3.5–5.0)
Alkaline Phosphatase: 48 U/L (ref 38–126)
Anion gap: 8 (ref 5–15)
BUN: 9 mg/dL (ref 8–23)
CO2: 24 mmol/L (ref 22–32)
Calcium: 8.5 mg/dL — ABNORMAL LOW (ref 8.9–10.3)
Chloride: 106 mmol/L (ref 98–111)
Creatinine: 0.89 mg/dL (ref 0.61–1.24)
GFR, Estimated: >60 mL/min (ref >60)
Glucose, Bld: 87 mg/dL (ref 70–99)
Potassium: 3.8 mmol/L (ref 3.5–5.1)
Sodium: 138 mmol/L (ref 135–145)
Total Bilirubin: 0.7 mg/dL (ref 0.3–1.2)
Total Protein: 6.1 g/dL — ABNORMAL LOW (ref 6.5–8.1)

## 2022-12-31 MED ORDER — FENTANYL 25 MCG/HR TD PT72
1.0000 | MEDICATED_PATCH | TRANSDERMAL | 0 refills | Status: DC
Start: 2022-12-31 — End: 2023-02-07

## 2022-12-31 MED ORDER — DOXYCYCLINE HYCLATE 100 MG PO TABS: 100.0000 mg | ORAL_TABLET | Freq: Two times a day (BID) | ORAL | 0 refills | Status: AC

## 2022-12-31 MED ORDER — OXYCODONE HCL 5 MG PO TABS
5.0000 mg | ORAL_TABLET | ORAL | 0 refills | Status: DC | PRN
Start: 2022-12-31 — End: 2023-01-26

## 2022-12-31 MED ORDER — REVLIMID 15 MG PO CAPS
ORAL_CAPSULE | ORAL | 0 refills | Status: DC
Start: 2022-12-31 — End: 2023-01-27

## 2022-12-31 NOTE — Progress Notes (Signed)
HEMATOLOGY/ONCOLOGY CLINIC NOTE  Date of Service: 12/31/22   Patient Care Team: Milus Height, [place] as PCP - General (Nurse Practitioner)  CHIEF COMPLAINTS/PURPOSE OF CONSULTATION:  Follow-up for continued evaluation and management of multiple myeloma  HISTORY OF PRESENTING ILLNESS:   Anthony [name] is a wonderful 62 y.o. male who has been referred to [place] by Dr Milus Height, [place] for evaluation and management of newly diagnosed multiple myeloma. He reports He is doing well.  He reports persistent lower back pain that he has had since he was in his 20's. He notes no previous significant injuries. He further notes that he gets chiropractic adjustments and takes Tylenol to manage his symptoms. He notes less back pain when standing up on his right leg and maintaining weight on his right leg.  He reports previous history of smoking and chewing tobacco over 40 years ago.  He had a recent fall back in February of this year with only a pulled muscle. And he notes another fall when chasing his cat where he fell on his right shoulder. He reports pain in right shoulder.   He had a recent COVID-19 infection back in March.  He reports intermittent cough with SOB. He notes he takes 2 Rolaids at night. We discussed potentially trying antacids which he is agreeable to as he will begin steroids soon and was advised of the possible symptoms and side effects from taking steroids.  We discussed CRAB criteria and that he meets at least two of the criterion being  anemia and bone disease.  We discussed getting additional scans for further evaluation which he is agreeable to.  We further discussed starting Daratumumab/Velcade/Dexamethasone/Zometa and Revlimid for treatment which he is agreeable to. We also discussed starting steroids before treatment and taking an acid suppressant which he was also agreeable to.  We discussed getting Senna and taking it as needed for constipation.  Labs done today  were reviewed in detail.  We discussed his recent bone marrow biopsy and aspiration done 02/06/2022.  We discussed PET/CT scan done 01/28/2022.  We discussed CT right shoulder w/o contrast done 11/13/2021  INTERVAL HISTORY:  Anthony [name] is a 62 y.o. male, is here for continued evaluation of and management of multiple myeloma. Patient is here for cycle 10 day 15 of his treatment on 12/31/2022. We will Hold treatment today due to infection.   Patient was last seen by me on 12/04/2022 and he was doing well overall. He did complain of 5 lbs weight loss and a lump behind left thigh and worsening back pain during the last visit.   Patient is accompanied by his wife during this visit. He notes he has been doing well overall since our last visit. He went to [place] for vacation and came back this Sunday, 12/29/2022. Patient complains of congestion with yellow secretion, cough, upper throat pain, swollen lymph glands, ears pressurized, and blood in right ear. He notes that his symptoms started Sunday night after his vacation. Patient's wife notes that his cough was severe yesterday, but has improved today.   Patient notes he tolerated his previous cycle of his treatment well without any new or severe toxicities.   He denies fever, chills, night sweats, unexpected weight loss, abdominal pain, chest pain, or leg swelling. He does complain of chronic back pain and occasional bilateral leg swelling.   Patient notes that azithromycin did not help him in the past.     MEDICAL HISTORY:  Past Medical History:  Diagnosis  Date   Back pain    Cancer (HCC)    Multiple myeloma (HCC) 02/15/2022    SURGICAL HISTORY: Past Surgical History:  Procedure Laterality Date   IR BONE TUMOR(S)RF ABLATION  05/07/2022   IR BONE TUMOR(S)RF ABLATION  05/07/2022   IR BONE TUMOR(S)RF ABLATION  05/07/2022   IR KYPHO EA ADDL LEVEL THORACIC OR LUMBAR  05/07/2022   IR KYPHO LUMBAR INC FX REDUCE BONE BX UNI/BIL  CANNULATION INC/IMAGING  05/07/2022   IR KYPHO THORACIC WITH BONE BIOPSY  05/07/2022   IR RADIOLOGIST EVAL & MGMT  04/23/2022   IR RADIOLOGIST EVAL & MGMT  05/17/2022   IR RADIOLOGIST EVAL & MGMT  06/06/2022    SOCIAL HISTORY: Social History   Socioeconomic History   Marital status: Married    Spouse name: Not on file   Number of children: Not on file   Years of education: Not on file   Highest education level: Not on file  Occupational History   Not on file  Tobacco Use   Smoking status: Never   Smokeless tobacco: Former    Types: Chew, Snuff  Vaping Use   Vaping Use: Never used  Substance and Sexual Activity   Alcohol use: Yes    Alcohol/week: 2.0 standard drinks of alcohol    Types: 2 Glasses of wine per week   Drug use: No   Sexual activity: Yes  Other Topics Concern   Not on file  Social History Narrative   Not on file   Social Determinants of Health   Financial Resource Strain: Low Risk  (05/07/2022)   Overall Financial Resource Strain (CARDIA)    Difficulty of Paying Living Expenses: Not very hard  Food Insecurity: No Food Insecurity (05/07/2022)   Hunger Vital Sign    Worried About Running Out of Food in the Last Year: Never true    Ran Out of Food in the Last Year: Never true  Transportation Needs: No Transportation Needs (05/07/2022)   PRAPARE - Administrator, Civil Service (Medical): No    Lack of Transportation (Non-Medical): No  Physical Activity: Not on file  Stress: Not on file  Social Connections: Not on file  Intimate Partner Violence: Unknown (05/07/2022)   Humiliation, Afraid, Rape, and Kick questionnaire    Fear of Current or Ex-Partner: No    Emotionally Abused: No    Physically Abused: No    Sexually Abused: Not on file    FAMILY HISTORY: Family History  Problem Relation Age of Onset   Rheum arthritis Mother    Other Sister        Pre-cancerous uterine mass;    Rheum arthritis Sister    Bone cancer Paternal Grandfather     Brain cancer Maternal Uncle    Brain cancer Maternal Aunt    Osteoporosis Neg Hx     ALLERGIES:  has No Known Allergies.  MEDICATIONS:  Current Outpatient Medications  Medication Sig Dispense Refill   acyclovir (ZOVIRAX) 400 MG tablet Take 1 tablet (400 mg total) by mouth 2 (two) times daily. 60 tablet 11   aluminum chloride (DRYSOL) 20 % external solution Apply 1 application topically at bedtime.     amoxicillin-clavulanate (AUGMENTIN) 875-125 MG tablet Take 1 tablet by mouth 2 (two) times daily.     calcium-vitamin D (OSCAL WITH D) 250-125 MG-UNIT tablet Take 1 tablet by mouth daily.     cyclobenzaprine (FLEXERIL) 10 MG tablet Take 10 mg by mouth 3 (three) times daily as  needed for muscle spasms.     diclofenac sodium (VOLTAREN) 1 % GEL Apply 2 g topically as needed.     dronabinol (MARINOL) 5 MG capsule Take 1 capsule (5 mg total) by mouth 2 (two) times daily before a meal. 60 capsule 0   ELIQUIS 5 MG TABS tablet Take 1 tablet (5 mg total) by mouth 2 (two) times daily. 60 tablet 5   ergocalciferol (VITAMIN D2) 1.25 MG (50000 UT) capsule Take 1 capsule (50,000 Units total) by mouth 2 (two) times daily. 12 capsule 2   esomeprazole (NEXIUM) 40 MG capsule TAKE ONE CAPSULE BY MOUTH DAILY BEFORE BREAKFAST 30 capsule 1   fentaNYL (DURAGESIC) 25 MCG/HR Place 1 patch onto the skin every 3 (three) days. 10 patch 0   furosemide (LASIX) 20 MG tablet Take 1 tablet (20 mg total) by mouth daily. 30 tablet 1   lidocaine (LIDODERM) 5 % Place 1 patch onto the skin daily. Remove & Discard patch within 12 hours or as directed by MD 30 patch 0   methocarbamol (ROBAXIN) 500 MG tablet Take 1 tablet (500 mg total) by mouth every 8 (eight) hours as needed for muscle spasms. 30 tablet 0   methylPREDNISolone (MEDROL DOSEPAK) 4 MG TBPK tablet Take 6 pills by mouth day 1, 5 on day 2, 4 on day 3, 3 on day 4, 2 on day 5, 1 on day 6 21 tablet 0   Multiple Vitamin (MULTIVITAMIN) tablet Take 1 tablet by mouth daily.      Omega-3 Fatty Acids (FISH OIL) 1360 MG CAPS Take 1 capsule by mouth daily.     ondansetron (ZOFRAN) 8 MG tablet Take 1 tablet (8 mg total) by mouth every 8 (eight) hours as needed for nausea or vomiting. 30 tablet 0   oxyCODONE (ROXICODONE) 5 MG immediate release tablet Take 1 tablet (5 mg total) by mouth every 4 (four) hours as needed for severe pain or moderate pain. Take 1 to 2 tablets every 4 hours as needed for moderate to severe pain. 60 tablet 0   prochlorperazine (COMPAZINE) 10 MG tablet TAKE ONE TABLET BY MOUTH EVERY 6 HOURS AS NEEDED FOR NAUSEA AND VOMITING 30 tablet 1   REVLIMID 15 MG capsule TAKE 1 CAPSULE (15 mg) DAILY FOR 21 DAYS ON THEN 7 DAYS OFF 21 capsule 0   senna-docusate (SENNA S) 8.6-50 MG tablet Take 2 tablets by mouth at bedtime. 60 tablet 1   terbinafine (LAMISIL) 1 % cream Apply 1 application topically 2 (two) times daily.     No current facility-administered medications for this visit.   REVIEW OF SYSTEMS:  10 Point review of Systems was done is negative except as noted above.   PHYSICAL EXAMINATION: .BP (!) 152/95 (BP Location: Left Arm, Patient Position: Sitting) Comment: Nurse notified  Pulse 76   Temp 97.9 F (36.6 C) (Temporal)   Resp 17   Wt 189 lb 3.2 oz (85.8 kg)   SpO2 99%   BMI 29.63 kg/m   GENERAL:alert, in no acute distress and comfortable SKIN: no acute rashes, no significant lesions EYES: conjunctiva are pink and non-injected, sclera anicteric OROPHARYNX: MMM, no exudates, no oropharyngeal erythema or ulceration NECK: supple, no JVD LYMPH:  no palpable lymphadenopathy in the cervical, axillary or inguinal regions LUNGS: clear to auscultation b/l with normal respiratory effort HEART: regular rate & rhythm ABDOMEN:  normoactive bowel sounds , non tender, not distended. Extremity: no pedal edema PSYCH: alert & oriented x 3 with fluent speech NEURO: no  focal motor/sensory deficits   LABORATORY DATA:  I have reviewed the data as listed .     Latest Ref Rng & Units 12/31/2022   10:52 AM 12/18/2022    8:27 AM 12/04/2022   11:11 AM  CBC  WBC 4.0 - 10.5 K/uL 4.6  2.7  4.0   Hemoglobin 13.0 - 17.0 g/dL 16.1  09.6  04.5   Hematocrit 39.0 - 52.0 % 43.0  43.4  43.5   Platelets 150 - 400 K/uL 134  174  137       Latest Ref Rng & Units 12/31/2022   10:52 AM 12/18/2022    8:27 AM 12/04/2022   11:11 AM  CMP  Glucose 70 - 99 mg/dL 87  409  99   BUN 8 - 23 mg/dL 9  13  15    Creatinine 0.61 - 1.24 mg/dL 8.11  9.14  7.82   Sodium 135 - 145 mmol/L 138  138  137   Potassium 3.5 - 5.1 mmol/L 3.8  4.2  3.9   Chloride 98 - 111 mmol/L 106  105  106   CO2 22 - 32 mmol/L 24  25  24    Calcium 8.9 - 10.3 mg/dL 8.5  8.8  9.1   Total Protein 6.5 - 8.1 g/dL 6.1  5.9  6.3   Total Bilirubin 0.3 - 1.2 mg/dL 0.7  0.7  0.8   Alkaline Phos 38 - 126 U/L 48  43  40   AST 15 - 41 U/L 12  12  12    ALT 0 - 44 U/L 14  10  12      PATHOLOGY Surgical Pathology CASE: [id number] PATIENT: Kijana Zeiss Bone Marrow Report     Clinical History: Multiple Myeloma     DIAGNOSIS:  BONE MARROW, ASPIRATE, CLOT, CORE: -Hypercellular bone marrow (60%) with erythroid predominant trilineage hematopoiesis and no evidence of residual plasma cell neoplasm (less than 1% plasma cells by manual aspirate differential, and CD138 immunohistochemical analysis of clot and core sections).  PERIPHERAL BLOOD: -Leukopenia  MICROSCOPIC DESCRIPTION:  PERIPHERAL BLOOD SMEAR: Platelets: Adequate in number, no platelet clumps identified Erythroid: Normocytic normochromic red blood cells Leukocytes: Mild leukopenia, negative for dysplastic granulocytes, blasts or  plasma cells  BONE MARROW ASPIRATE: Cellular Erythroid precursors: Relatively increased, show a full sequence of maturation with occasional abnormal forms (including binucleate forms, megaloblastoid change and nuclear membrane irregularities) Granulocytic precursors: Decreased, show a full sequence of  generally orderly maturation Megakaryocytes: Normal in number and morphology Lymphocytes/plasma cells: Not increased   02/06/2022 Molecular pathology   RADIOGRAPHIC STUDIES: I have personally reviewed the radiological images as listed and agreed with the findings in the report. No results found.   ASSESSMENT & PLAN:   62 y.o. very pleasant male with  1. R-ISS stage III multiple myeloma with extensive bone metastases and patholgiic fracture rt shoulder -Recent bone marrow biopsy and aspiration done 02/06/2022 revealed hypercellular bone marrow with plasma cell neoplasm. Cytogenetics-normal karyotype Molecular cytogenetics-TP53 deletion and duplication of 1 q.Marland Kitchen  PLAN: -Discussed lab results from today, 12/31/2022, with the patient. CBC shows decreased Platelets counts ar 134 K. CMP is stable.  -Discussed multiple myeloma panel results from 12/18/2022 with the patient, which does not show M-protein.  -Patient is in remission according to his labs and multiple myeloma panel.  -Patient tolerated his previous cycle of his treatment well without any new or severe toxicities.  -We will hold cycle 4 of his treatment today due to infection issues.  -Prescribed  Doxycycline 100 mg fo URIr infection.  -Recommend to start probiotics for now.   -answered all of patient's questions in detail -no clinical sign or evidence of multiple myeloma progression at this time   FOLLOW-UP: Per integrated scheduling  The total time spent in the appointment was 20 minutes* .  All of the patient's questions were answered with apparent satisfaction. The patient knows to call the clinic with any problems, questions or concerns.   Wyvonnia Lora MD MS AAHIVMS [facility] Hematology/Oncology Physician [facility]  .*Total Encounter Time as defined by the Centers for Medicare and Medicaid Services includes, in addition to the face-to-face time of a patient visit (documented in the note above)  non-face-to-face time: obtaining and reviewing outside history, ordering and reviewing medications, tests or procedures, care coordination (communications with other health care professionals or caregivers) and documentation in the medical record.   I, Ok Edwards, am acting as a Neurosurgeon for Wyvonnia Lora, MD. .I have reviewed the above documentation for accuracy and completeness, and I agree with the above. Johney Maine MD

## 2022-12-31 NOTE — Progress Notes (Signed)
Patient seen by Dr. Candise Che Per physician team, patient will not be receiving treatment today.

## 2023-01-02 ENCOUNTER — Other Ambulatory Visit: Payer: Self-pay

## 2023-01-06 ENCOUNTER — Encounter: Payer: Self-pay | Admitting: Hematology

## 2023-01-08 ENCOUNTER — Other Ambulatory Visit: Payer: Self-pay | Admitting: Hematology

## 2023-01-08 MED ORDER — METHOCARBAMOL 500 MG PO TABS: 500.0000 mg | ORAL_TABLET | Freq: Three times a day (TID) | ORAL | 0 refills | Status: DC | PRN

## 2023-01-09 DIAGNOSIS — C9000 Multiple myeloma not having achieved remission: Secondary | ICD-10-CM | POA: Diagnosis not present

## 2023-01-09 DIAGNOSIS — H7291 Unspecified perforation of tympanic membrane, right ear: Secondary | ICD-10-CM | POA: Diagnosis not present

## 2023-01-10 DIAGNOSIS — H7291 Unspecified perforation of tympanic membrane, right ear: Secondary | ICD-10-CM | POA: Diagnosis not present

## 2023-01-10 DIAGNOSIS — H9191 Unspecified hearing loss, right ear: Secondary | ICD-10-CM | POA: Diagnosis not present

## 2023-01-15 ENCOUNTER — Other Ambulatory Visit: Payer: Self-pay

## 2023-01-15 DIAGNOSIS — C9000 Multiple myeloma not having achieved remission: Secondary | ICD-10-CM

## 2023-01-16 ENCOUNTER — Encounter: Payer: Self-pay | Admitting: Hematology

## 2023-01-16 ENCOUNTER — Inpatient Hospital Stay: Payer: No Typology Code available for payment source

## 2023-01-16 VITALS — BP 133/81 | HR 72 | Temp 97.9°F | Resp 18 | Ht 67.0 in | Wt 187.0 lb

## 2023-01-16 DIAGNOSIS — R059 Cough, unspecified: Secondary | ICD-10-CM | POA: Diagnosis not present

## 2023-01-16 DIAGNOSIS — C9000 Multiple myeloma not having achieved remission: Secondary | ICD-10-CM

## 2023-01-16 DIAGNOSIS — M545 Low back pain, unspecified: Secondary | ICD-10-CM | POA: Diagnosis not present

## 2023-01-16 DIAGNOSIS — R0602 Shortness of breath: Secondary | ICD-10-CM | POA: Diagnosis not present

## 2023-01-16 DIAGNOSIS — G8929 Other chronic pain: Secondary | ICD-10-CM | POA: Diagnosis not present

## 2023-01-16 DIAGNOSIS — Z5112 Encounter for antineoplastic immunotherapy: Secondary | ICD-10-CM | POA: Diagnosis present

## 2023-01-16 DIAGNOSIS — Z7189 Other specified counseling: Secondary | ICD-10-CM

## 2023-01-16 DIAGNOSIS — M25511 Pain in right shoulder: Secondary | ICD-10-CM | POA: Diagnosis not present

## 2023-01-16 DIAGNOSIS — Z87891 Personal history of nicotine dependence: Secondary | ICD-10-CM | POA: Diagnosis not present

## 2023-01-16 DIAGNOSIS — R634 Abnormal weight loss: Secondary | ICD-10-CM | POA: Diagnosis not present

## 2023-01-16 LAB — CMP (CANCER CENTER ONLY)
ALT: 12 U/L (ref 0–44)
AST: 12 U/L — ABNORMAL LOW (ref 15–41)
Albumin: 4.2 g/dL (ref 3.5–5.0)
Alkaline Phosphatase: 46 U/L (ref 38–126)
Anion gap: 8 (ref 5–15)
BUN: 19 mg/dL (ref 8–23)
CO2: 23 mmol/L (ref 22–32)
Calcium: 8.7 mg/dL — ABNORMAL LOW (ref 8.9–10.3)
Chloride: 108 mmol/L (ref 98–111)
Creatinine: 0.9 mg/dL (ref 0.61–1.24)
GFR, Estimated: >60 mL/min (ref >60)
Glucose, Bld: 121 mg/dL — ABNORMAL HIGH (ref 70–99)
Potassium: 4 mmol/L (ref 3.5–5.1)
Sodium: 139 mmol/L (ref 135–145)
Total Bilirubin: 0.6 mg/dL (ref 0.3–1.2)
Total Protein: 6.1 g/dL — ABNORMAL LOW (ref 6.5–8.1)

## 2023-01-16 LAB — CBC WITH DIFFERENTIAL (CANCER CENTER ONLY)
Abs Immature Granulocytes: 0 10*3/uL (ref 0.00–0.07)
Basophils Absolute: 0 10*3/uL (ref 0.0–0.1)
Basophils Relative: 1 %
Eosinophils Absolute: 0.2 10*3/uL (ref 0.0–0.5)
Eosinophils Relative: 8 %
HCT: 44.7 % (ref 39.0–52.0)
Hemoglobin: 15.1 g/dL (ref 13.0–17.0)
Immature Granulocytes: 0 %
Lymphocytes Relative: 30 %
Lymphs Abs: 0.9 10*3/uL (ref 0.7–4.0)
MCH: 28.5 pg (ref 26.0–34.0)
MCHC: 33.8 g/dL (ref 30.0–36.0)
MCV: 84.5 fL (ref 80.0–100.0)
Monocytes Absolute: 0.3 10*3/uL (ref 0.1–1.0)
Monocytes Relative: 9 %
Neutro Abs: 1.6 10*3/uL — ABNORMAL LOW (ref 1.7–7.7)
Neutrophils Relative %: 52 %
Platelet Count: 166 10*3/uL (ref 150–400)
RBC: 5.29 MIL/uL (ref 4.22–5.81)
RDW: 13.9 % (ref 11.5–15.5)
WBC Count: 3.1 10*3/uL — ABNORMAL LOW (ref 4.0–10.5)
nRBC: 0 % (ref 0.0–0.2)

## 2023-01-16 MED ORDER — DEXTROSE 5 % IV SOLN
70.0000 mg/m2 | Freq: Once | INTRAVENOUS | Status: AC
  Administered 2023-01-16: 140 mg via INTRAVENOUS
  Filled 2023-01-16: qty 60

## 2023-01-16 MED ORDER — ZOLEDRONIC ACID 4 MG/100ML IV SOLN
4.0000 mg | Freq: Once | INTRAVENOUS | Status: AC
  Administered 2023-01-16: 4 mg via INTRAVENOUS
  Filled 2023-01-16: qty 100

## 2023-01-16 MED ORDER — SODIUM CHLORIDE 0.9 % IV SOLN: Freq: Once | INTRAVENOUS | Status: AC

## 2023-01-16 MED ORDER — PALONOSETRON HCL INJECTION 0.25 MG/5ML
0.2500 mg | Freq: Once | INTRAVENOUS | Status: AC
  Administered 2023-01-16: 0.25 mg via INTRAVENOUS
  Filled 2023-01-16: qty 5

## 2023-01-16 MED ORDER — DEXAMETHASONE SODIUM PHOSPHATE 10 MG/ML IJ SOLN
8.0000 mg | Freq: Once | INTRAMUSCULAR | Status: AC
  Administered 2023-01-16: 8 mg via INTRAVENOUS
  Filled 2023-01-16: qty 1

## 2023-01-16 NOTE — Progress Notes (Signed)
Proceed with Zometa today. Ok to treat with mild hypocalcemia.  Anola Gurney [place], [place], BCPS, BCOP 01/16/2023 9:34 AM

## 2023-01-16 NOTE — Patient Instructions (Signed)
[facility]  Discharge Instructions: Thank you for choosing [facility] to provide your oncology and hematology care.   If you have a lab appointment with the Cancer Center, please go directly to the Cancer Center and check in at the registration area.   Wear comfortable clothing and clothing appropriate for easy access to any Portacath or PICC line.   We strive to give you quality time with your provider. You may need to reschedule your appointment if you arrive late (15 or more minutes).  Arriving late affects you and other patients whose appointments are after yours.  Also, if you miss three or more appointments without notifying the office, you may be dismissed from the clinic at the provider's discretion.      For prescription refill requests, have your pharmacy contact our office and allow 72 hours for refills to be completed.    Today you received the following chemotherapy and/or immunotherapy agents : Kyprolis      To help prevent nausea and vomiting after your treatment, we encourage you to take your nausea medication as directed.  BELOW ARE SYMPTOMS THAT SHOULD BE REPORTED IMMEDIATELY: *FEVER GREATER THAN 100.4 F (38 C) OR HIGHER *CHILLS OR SWEATING *NAUSEA AND VOMITING THAT IS NOT CONTROLLED WITH YOUR NAUSEA MEDICATION *UNUSUAL SHORTNESS OF BREATH *UNUSUAL BRUISING OR BLEEDING *URINARY PROBLEMS (pain or burning when urinating, or frequent urination) *BOWEL PROBLEMS (unusual diarrhea, constipation, pain near the anus) TENDERNESS IN MOUTH AND THROAT WITH OR WITHOUT PRESENCE OF ULCERS (sore throat, sores in mouth, or a toothache) UNUSUAL RASH, SWELLING OR PAIN  UNUSUAL VAGINAL DISCHARGE OR ITCHING   Items with * indicate a potential emergency and should be followed up as soon as possible or go to the Emergency Department if any problems should occur.  Please show the CHEMOTHERAPY ALERT CARD or IMMUNOTHERAPY ALERT CARD at  check-in to the Emergency Department and triage nurse.  Should you have questions after your visit or need to cancel or reschedule your appointment, please contact [facility]  Dept: [phone]  and follow the prompts.  Office hours are 8:00 a.m. to 4:30 p.m. Monday - Friday. Please note that voicemails left after 4:00 p.m. may not be returned until the following business day.  We are closed weekends and major holidays. You have access to a nurse at all times for urgent questions. Please call the main number to the clinic Dept: [phone] and follow the prompts.   For any non-urgent questions, you may also contact your provider using MyChart. We now offer e-Visits for anyone 74 and older to request care online for non-urgent symptoms. For details visit [email or web address].   Also download the MyChart app! Go to the app store, search "MyChart", open the app, select [place], and log in with your MyChart username and password.  Zoledronic Acid Injection (Cancer) What is this medication? ZOLEDRONIC ACID (ZOE le dron ik AS id) treats high calcium levels in the blood caused by cancer. It may also be used with chemotherapy to treat weakened bones caused by cancer. It works by slowing down the release of calcium from bones. This lowers calcium levels in your blood. It also makes your bones stronger and less likely to break (fracture). It belongs to a group of medications called bisphosphonates. This medicine may be used for other purposes; ask your health care provider or pharmacist if you have questions. COMMON BRAND NAME(S): Zometa, Zometa Powder What  should I tell my care team before I take this medication? They need to know if you have any of these conditions: Dehydration Dental disease Kidney disease Liver disease Low levels of calcium in the blood Lung or breathing disease, such as asthma Receiving steroids, such as dexamethasone or prednisone An  unusual or allergic reaction to zoledronic acid, other medications, foods, dyes, or preservatives Pregnant or trying to get pregnant Breast-feeding How should I use this medication? This medication is injected into a vein. It is given by your care team in a hospital or clinic setting. Talk to your care team about the use of this medication in children. Special care may be needed. Overdosage: If you think you have taken too much of this medicine contact a poison control center or emergency room at once. NOTE: This medicine is only for you. Do not share this medicine with others. What if I miss a dose? Keep appointments for follow-up doses. It is important not to miss your dose. Call your care team if you are unable to keep an appointment. What may interact with this medication? Certain antibiotics given by injection Diuretics, such as bumetanide, furosemide NSAIDs, medications for pain and inflammation, such as ibuprofen or naproxen Teriparatide Thalidomide This list may not describe all possible interactions. Give your health care provider a list of all the medicines, herbs, non-prescription drugs, or dietary supplements you use. Also tell them if you smoke, drink alcohol, or use illegal drugs. Some items may interact with your medicine. What should I watch for while using this medication? Visit your care team for regular checks on your progress. It may be some time before you see the benefit from this medication. Some people who take this medication have severe bone, joint, or muscle pain. This medication may also increase your risk for jaw problems or a broken thigh bone. Tell your care team right away if you have severe pain in your jaw, bones, joints, or muscles. Tell you care team if you have any pain that does not go away or that gets worse. Tell your dentist and dental surgeon that you are taking this medication. You should not have major dental surgery while on this medication. See your  dentist to have a dental exam and fix any dental problems before starting this medication. Take good care of your teeth while on this medication. Make sure you see your dentist for regular follow-up appointments. You should make sure you get enough calcium and vitamin D while you are taking this medication. Discuss the foods you eat and the vitamins you take with your care team. Check with your care team if you have severe diarrhea, nausea, and vomiting, or if you sweat a lot. The loss of too much body fluid may make it dangerous for you to take this medication. You may need bloodwork while taking this medication. Talk to your care team if you wish to become pregnant or think you might be pregnant. This medication can cause serious birth defects. What side effects may I notice from receiving this medication? Side effects that you should report to your care team as soon as possible: Allergic reactions--skin rash, itching, hives, swelling of the face, lips, tongue, or throat Kidney injury--decrease in the amount of urine, swelling of the ankles, hands, or feet Low calcium level--muscle pain or cramps, confusion, tingling, or numbness in the hands or feet Osteonecrosis of the jaw--pain, swelling, or redness in the mouth, numbness of the jaw, poor healing after dental work, unusual  discharge from the mouth, visible bones in the mouth Severe bone, joint, or muscle pain Side effects that usually do not require medical attention (report to your care team if they continue or are bothersome): Constipation Fatigue Fever Loss of appetite Nausea Stomach pain This list may not describe all possible side effects. Call your doctor for medical advice about side effects. You may report side effects to FDA at 1-800-FDA-1088. Where should I keep my medication? This medication is given in a hospital or clinic. It will not be stored at home. NOTE: This sheet is a summary. It may not cover all possible information.  If you have questions about this medicine, talk to your doctor, pharmacist, or health care provider.  2024 Elsevier/Gold Standard (2021-09-28 00:00:00)

## 2023-01-17 ENCOUNTER — Other Ambulatory Visit: Payer: Self-pay

## 2023-01-21 ENCOUNTER — Encounter: Payer: Self-pay | Admitting: Hematology

## 2023-01-22 ENCOUNTER — Encounter: Payer: Self-pay | Admitting: Hematology

## 2023-01-23 ENCOUNTER — Encounter: Payer: Self-pay | Admitting: Hematology

## 2023-01-24 ENCOUNTER — Encounter: Payer: Self-pay | Admitting: Hematology

## 2023-01-25 ENCOUNTER — Other Ambulatory Visit: Payer: Self-pay | Admitting: Hematology

## 2023-01-25 DIAGNOSIS — Z7189 Other specified counseling: Secondary | ICD-10-CM

## 2023-01-25 DIAGNOSIS — C9000 Multiple myeloma not having achieved remission: Secondary | ICD-10-CM

## 2023-01-26 ENCOUNTER — Other Ambulatory Visit: Payer: Self-pay | Admitting: Hematology

## 2023-01-26 DIAGNOSIS — C9000 Multiple myeloma not having achieved remission: Secondary | ICD-10-CM

## 2023-01-27 ENCOUNTER — Other Ambulatory Visit: Payer: Self-pay

## 2023-01-27 DIAGNOSIS — R3914 Feeling of incomplete bladder emptying: Secondary | ICD-10-CM | POA: Diagnosis not present

## 2023-01-27 DIAGNOSIS — C9000 Multiple myeloma not having achieved remission: Secondary | ICD-10-CM

## 2023-01-27 DIAGNOSIS — N401 Enlarged prostate with lower urinary tract symptoms: Secondary | ICD-10-CM | POA: Diagnosis not present

## 2023-01-27 LAB — MULTIPLE MYELOMA PANEL, SERUM
Albumin SerPl Elph-Mcnc: 3.7 g/dL (ref 2.9–4.4)
Albumin/Glob SerPl: 1.9 — ABNORMAL HIGH (ref 0.7–1.7)
Alpha 1: 0.2 g/dL (ref 0.0–0.4)
Alpha2 Glob SerPl Elph-Mcnc: 0.6 g/dL (ref 0.4–1.0)
B-Globulin SerPl Elph-Mcnc: 0.8 g/dL (ref 0.7–1.3)
Gamma Glob SerPl Elph-Mcnc: 0.4 g/dL (ref 0.4–1.8)
Globulin, Total: 2 g/dL — ABNORMAL LOW (ref 2.2–3.9)
IgA: 22 mg/dL — ABNORMAL LOW (ref 61–437)
IgG (Immunoglobin G), Serum: 434 mg/dL — ABNORMAL LOW (ref 603–1613)
IgM (Immunoglobulin M), Srm: 21 mg/dL (ref 20–172)
Total Protein ELP: 5.7 g/dL — ABNORMAL LOW (ref 6.0–8.5)

## 2023-01-27 MED ORDER — REVLIMID 15 MG PO CAPS
ORAL_CAPSULE | ORAL | 0 refills | Status: DC
Start: 2023-01-27 — End: 2023-02-21

## 2023-01-28 ENCOUNTER — Other Ambulatory Visit: Payer: Self-pay

## 2023-01-28 ENCOUNTER — Encounter: Payer: Self-pay | Admitting: Hematology

## 2023-01-28 DIAGNOSIS — C9000 Multiple myeloma not having achieved remission: Secondary | ICD-10-CM

## 2023-01-28 MED ORDER — OXYCODONE HCL 5 MG PO TABS
5.0000 mg | ORAL_TABLET | ORAL | 0 refills | Status: DC | PRN
Start: 2023-01-28 — End: 2023-02-13

## 2023-01-29 ENCOUNTER — Inpatient Hospital Stay (HOSPITAL_BASED_OUTPATIENT_CLINIC_OR_DEPARTMENT_OTHER): Payer: No Typology Code available for payment source | Admitting: Hematology

## 2023-01-29 ENCOUNTER — Inpatient Hospital Stay: Payer: No Typology Code available for payment source

## 2023-01-29 ENCOUNTER — Other Ambulatory Visit: Payer: Self-pay

## 2023-01-29 ENCOUNTER — Inpatient Hospital Stay: Payer: No Typology Code available for payment source | Attending: Physician Assistant

## 2023-01-29 ENCOUNTER — Inpatient Hospital Stay: Payer: No Typology Code available for payment source | Admitting: Dietician

## 2023-01-29 VITALS — BP 135/82 | HR 92 | Temp 97.9°F | Resp 18 | Ht 67.0 in | Wt 192.1 lb

## 2023-01-29 DIAGNOSIS — R059 Cough, unspecified: Secondary | ICD-10-CM | POA: Diagnosis not present

## 2023-01-29 DIAGNOSIS — M545 Low back pain, unspecified: Secondary | ICD-10-CM | POA: Insufficient documentation

## 2023-01-29 DIAGNOSIS — C9000 Multiple myeloma not having achieved remission: Secondary | ICD-10-CM | POA: Insufficient documentation

## 2023-01-29 DIAGNOSIS — G8929 Other chronic pain: Secondary | ICD-10-CM | POA: Diagnosis not present

## 2023-01-29 DIAGNOSIS — R0602 Shortness of breath: Secondary | ICD-10-CM | POA: Diagnosis not present

## 2023-01-29 DIAGNOSIS — R110 Nausea: Secondary | ICD-10-CM | POA: Diagnosis not present

## 2023-01-29 DIAGNOSIS — R600 Localized edema: Secondary | ICD-10-CM | POA: Diagnosis not present

## 2023-01-29 DIAGNOSIS — M25511 Pain in right shoulder: Secondary | ICD-10-CM | POA: Diagnosis not present

## 2023-01-29 DIAGNOSIS — Z7189 Other specified counseling: Secondary | ICD-10-CM

## 2023-01-29 DIAGNOSIS — Z5112 Encounter for antineoplastic immunotherapy: Secondary | ICD-10-CM | POA: Diagnosis present

## 2023-01-29 DIAGNOSIS — Z87891 Personal history of nicotine dependence: Secondary | ICD-10-CM | POA: Insufficient documentation

## 2023-01-29 LAB — CBC WITH DIFFERENTIAL (CANCER CENTER ONLY)
Abs Immature Granulocytes: 0.01 10*3/uL (ref 0.00–0.07)
Basophils Absolute: 0 10*3/uL (ref 0.0–0.1)
Basophils Relative: 1 %
Eosinophils Absolute: 0.2 10*3/uL (ref 0.0–0.5)
Eosinophils Relative: 7 %
HCT: 44.6 % (ref 39.0–52.0)
Hemoglobin: 15 g/dL (ref 13.0–17.0)
Immature Granulocytes: 0 %
Lymphocytes Relative: 27 %
Lymphs Abs: 1 10*3/uL (ref 0.7–4.0)
MCH: 28.5 pg (ref 26.0–34.0)
MCHC: 33.6 g/dL (ref 30.0–36.0)
MCV: 84.8 fL (ref 80.0–100.0)
Monocytes Absolute: 0.5 10*3/uL (ref 0.1–1.0)
Monocytes Relative: 14 %
Neutro Abs: 1.9 10*3/uL (ref 1.7–7.7)
Neutrophils Relative %: 51 %
Platelet Count: 152 10*3/uL (ref 150–400)
RBC: 5.26 MIL/uL (ref 4.22–5.81)
RDW: 14 % (ref 11.5–15.5)
WBC Count: 3.7 10*3/uL — ABNORMAL LOW (ref 4.0–10.5)
nRBC: 0 % (ref 0.0–0.2)

## 2023-01-29 LAB — BASIC METABOLIC PANEL
Anion gap: 7 (ref 5–15)
BUN: 12 mg/dL (ref 8–23)
CO2: 24 mmol/L (ref 22–32)
Calcium: 8.9 mg/dL (ref 8.9–10.3)
Chloride: 107 mmol/L (ref 98–111)
Creatinine, Ser: 0.99 mg/dL (ref 0.61–1.24)
GFR, Estimated: >60 mL/min (ref >60)
Glucose, Bld: 116 mg/dL — ABNORMAL HIGH (ref 70–99)
Potassium: 4.1 mmol/L (ref 3.5–5.1)
Sodium: 138 mmol/L (ref 135–145)

## 2023-01-29 MED ORDER — ACETAMINOPHEN 325 MG PO TABS
650.0000 mg | ORAL_TABLET | Freq: Once | ORAL | Status: AC
  Administered 2023-01-29: 650 mg via ORAL
  Filled 2023-01-29: qty 2

## 2023-01-29 MED ORDER — PROCHLORPERAZINE MALEATE 10 MG PO TABS
ORAL_TABLET | ORAL | 1 refills | Status: DC
Start: 2023-01-29 — End: 2023-08-08

## 2023-01-29 MED ORDER — DEXTROSE 5 % IV SOLN
70.0000 mg/m2 | Freq: Once | INTRAVENOUS | Status: AC
  Administered 2023-01-29: 140 mg via INTRAVENOUS
  Filled 2023-01-29: qty 60

## 2023-01-29 MED ORDER — ERGOCALCIFEROL 1.25 MG (50000 UT) PO CAPS
50000.0000 [IU] | ORAL_CAPSULE | Freq: Two times a day (BID) | ORAL | 2 refills | Status: DC
Start: 2023-01-29 — End: 2023-01-29

## 2023-01-29 MED ORDER — SODIUM CHLORIDE 0.9 % IV SOLN: Freq: Once | INTRAVENOUS | Status: AC

## 2023-01-29 MED ORDER — PALONOSETRON HCL INJECTION 0.25 MG/5ML
0.2500 mg | Freq: Once | INTRAVENOUS | Status: AC
  Administered 2023-01-29: 0.25 mg via INTRAVENOUS
  Filled 2023-01-29: qty 5

## 2023-01-29 MED ORDER — DEXAMETHASONE SODIUM PHOSPHATE 10 MG/ML IJ SOLN
8.0000 mg | Freq: Once | INTRAMUSCULAR | Status: AC
  Administered 2023-01-29: 8 mg via INTRAVENOUS
  Filled 2023-01-29: qty 1

## 2023-01-29 MED ORDER — ONDANSETRON HCL 8 MG PO TABS
8.0000 mg | ORAL_TABLET | Freq: Three times a day (TID) | ORAL | 0 refills | Status: AC | PRN
Start: 2023-01-29 — End: ?

## 2023-01-29 MED ORDER — ERGOCALCIFEROL 1.25 MG (50000 UT) PO CAPS
50000.0000 [IU] | ORAL_CAPSULE | ORAL | 2 refills | Status: DC
Start: 2023-01-30 — End: 2023-04-17

## 2023-01-29 NOTE — Progress Notes (Signed)
Patient seen by Dr. Kale  Vitals are within treatment parameters.  Labs reviewed: and are within treatment parameters.  Per physician team, patient is ready for treatment and there are NO modifications to the treatment plan.  

## 2023-01-29 NOTE — Progress Notes (Signed)
Nutrition Follow-up:  Patient with multiple myeloma. He is receiving kyprolis + darzalex q28d (start 04/07/22)  Met with patient in infusion. He reports appetite has not improved much, but is making conscious effort to eat. Patient has started drinking oral nutrition supplements. He has purchased a few different brands to try. Patient has enjoyed them all. He recalls making a float (ice cream topped with supplement) a few times weekly. Patient has been having a bedtime snack which is usually a peanut butter/honey sandwich. He continues to have "low-grade persistent nausea" the last few months. Patient reports recurrence of symptoms after he stopped taking marinol secondary to Sport and exercise psychologist. Patient reports bowel movements every couple of days. He is taking senna at night.   Medications: fentanyl, robaxin, oxycodone, marinol  Labs: reviewed   Anthropometrics: Wt 192 lb 1.6 oz today increased   5/30 - 187 lb  5/1 - 190 lb 4/17 - 192 lb 8 oz  3/7 - 198 lb 8 oz    NUTRITION DIAGNOSIS: Unintended weight loss improved     INTERVENTION:  Discussed strategies for poor appetite, encouraged eating by the clock Continue to drink oral nutrition supplement 1-2/day as needed. Educated to use ONS as a supplements vs meal replacement - provided samples + coupons Pt to resume marinol pending availability    MONITORING, EVALUATION, GOAL: weight trends, intake   NEXT VISIT: To be scheduled with treatment. Patient has contact information and encouraged to contact with nutrition questions/concerns

## 2023-01-29 NOTE — Progress Notes (Signed)
HEMATOLOGY/ONCOLOGY CLINIC NOTE  Date of Service: 01/29/23   Patient Care Team: Milus Height, [place] as PCP - General (Nurse Practitioner)  CHIEF COMPLAINTS/PURPOSE OF CONSULTATION:  Follow-up for continued evaluation and management of multiple myeloma  HISTORY OF PRESENTING ILLNESS:   Anthony [name] is a wonderful 62 y.o. male who has been referred to [place] by Dr Milus Height, PA for evaluation and management of newly diagnosed multiple myeloma. He reports He is doing well.  He reports persistent lower back pain that he has had since he was in his 20's. He notes no previous significant injuries. He further notes that he gets chiropractic adjustments and takes Tylenol to manage his symptoms. He notes less back pain when standing up on his right leg and maintaining weight on his right leg.  He reports previous history of smoking and chewing tobacco over 40 years ago.  He had a recent fall back in February of this year with only a pulled muscle. And he notes another fall when chasing his cat where he fell on his right shoulder. He reports pain in right shoulder.   He had a recent COVID-19 infection back in March.  He reports intermittent cough with SOB. He notes he takes 2 Rolaids at night. We discussed potentially trying antacids which he is agreeable to as he will begin steroids soon and was advised of the possible symptoms and side effects from taking steroids.  We discussed CRAB criteria and that he meets at least two of the criterion being  anemia and bone disease.  We discussed getting additional scans for further evaluation which he is agreeable to.  We further discussed starting Daratumumab/Velcade/Dexamethasone/Zometa and Revlimid for treatment which he is agreeable to. We also discussed starting steroids before treatment and taking an acid suppressant which he was also agreeable to.  We discussed getting Senna and taking it as needed for constipation.  Labs done today  were reviewed in detail.  We discussed his recent bone marrow biopsy and aspiration done 02/06/2022.  We discussed PET/CT scan done 01/28/2022.  We discussed CT right shoulder w/o contrast done 11/13/2021  INTERVAL HISTORY:  Anthony [name] is a 62 y.o. male, is here for continued evaluation of and management of multiple myeloma. Patient is here for cycle 11 day 15 of his treatment on 01/29/23.   Patient was last seen by me on 12/31/2022 and complained of unexpected 5 pound weight loss, a lump behind the left thigh, worsening back pain, congestion with yellow phlegm, cough, upper throat pain, swollen lymph glands, pressurized ears, blood in his right ear, and occasional bilateral leg swelling.   Today, patient is accompanied by his wife. He complains of frequent and constant mild nausea that is chronic. Patient denies any recent vomiting. His nausea does limit his p.o. intake. His weight has been fluctuating over the past couple of months, but he has recently gained 4 pounds. Patient notes he is eating in small quantities.  Patient also endorses an acute element of worsened nausea with Carfilzomib which presents in the late afternoons following treatment. He does report vomiting the next day which does resolve acute nausea. Patient takes Compazine during day and Ativan at night. Patient notes they've tried ginger, peppermint, and crushed ice to improve nausea symptoms. Patient recently began trying to use a pressure band in his right wrist.  He complains of mild left LE edema. He complains of weakness and a burning sensation in the bilateral legs when walking. He describes the  sensation as similar to exercising the muscles. He reports that he does not walk regularly. Patient also notes stable back pain, particularly in the right lower back.  Patient reports no new pain overall, though he attributes that to fentanyl patches and pain medication. He reports he has been staying active and fishing  recently. Patient expresses interest in working with physical therapy to improve strength and stamina.   MEDICAL HISTORY:  Past Medical History:  Diagnosis Date   Back pain    Cancer (HCC)    Multiple myeloma (HCC) 02/15/2022    SURGICAL HISTORY: Past Surgical History:  Procedure Laterality Date   IR BONE TUMOR(S)RF ABLATION  05/07/2022   IR BONE TUMOR(S)RF ABLATION  05/07/2022   IR BONE TUMOR(S)RF ABLATION  05/07/2022   IR KYPHO EA ADDL LEVEL THORACIC OR LUMBAR  05/07/2022   IR KYPHO LUMBAR INC FX REDUCE BONE BX UNI/BIL CANNULATION INC/IMAGING  05/07/2022   IR KYPHO THORACIC WITH BONE BIOPSY  05/07/2022   IR RADIOLOGIST EVAL & MGMT  04/23/2022   IR RADIOLOGIST EVAL & MGMT  05/17/2022   IR RADIOLOGIST EVAL & MGMT  06/06/2022    SOCIAL HISTORY: Social History   Socioeconomic History   Marital status: Married    Spouse name: Not on file   Number of children: Not on file   Years of education: Not on file   Highest education level: Not on file  Occupational History   Not on file  Tobacco Use   Smoking status: Never   Smokeless tobacco: Former    Types: Chew, Snuff  Vaping Use   Vaping Use: Never used  Substance and Sexual Activity   Alcohol use: Yes    Alcohol/week: 2.0 standard drinks of alcohol    Types: 2 Glasses of wine per week   Drug use: No   Sexual activity: Yes  Other Topics Concern   Not on file  Social History Narrative   Not on file   Social Determinants of Health   Financial Resource Strain: Low Risk  (05/07/2022)   Overall Financial Resource Strain (CARDIA)    Difficulty of Paying Living Expenses: Not very hard  Food Insecurity: No Food Insecurity (05/07/2022)   Hunger Vital Sign    Worried About Running Out of Food in the Last Year: Never true    Ran Out of Food in the Last Year: Never true  Transportation Needs: No Transportation Needs (05/07/2022)   PRAPARE - Administrator, Civil Service (Medical): No    Lack of Transportation  (Non-Medical): No  Physical Activity: Not on file  Stress: Not on file  Social Connections: Not on file  Intimate Partner Violence: Unknown (05/07/2022)   Humiliation, Afraid, Rape, and Kick questionnaire    Fear of Current or Ex-Partner: No    Emotionally Abused: No    Physically Abused: No    Sexually Abused: Not on file    FAMILY HISTORY: Family History  Problem Relation Age of Onset   Rheum arthritis Mother    Other Sister        Pre-cancerous uterine mass;    Rheum arthritis Sister    Bone cancer Paternal Grandfather    Brain cancer Maternal Uncle    Brain cancer Maternal Aunt    Osteoporosis Neg Hx     ALLERGIES:  has No Known Allergies.  MEDICATIONS:  Current Outpatient Medications  Medication Sig Dispense Refill   acyclovir (ZOVIRAX) 400 MG tablet Take 1 tablet (400 mg total) by mouth  2 (two) times daily. 60 tablet 11   aluminum chloride (DRYSOL) 20 % external solution Apply 1 application topically at bedtime.     calcium-vitamin D (OSCAL WITH D) 250-125 MG-UNIT tablet Take 1 tablet by mouth daily.     cyclobenzaprine (FLEXERIL) 10 MG tablet Take 10 mg by mouth 3 (three) times daily as needed for muscle spasms.     diclofenac sodium (VOLTAREN) 1 % GEL Apply 2 g topically as needed.     dronabinol (MARINOL) 5 MG capsule Take 1 capsule (5 mg total) by mouth 2 (two) times daily before a meal. 60 capsule 0   ELIQUIS 5 MG TABS tablet Take 1 tablet (5 mg total) by mouth 2 (two) times daily. 60 tablet 5   ergocalciferol (VITAMIN D2) 1.25 MG (50000 UT) capsule Take 1 capsule (50,000 Units total) by mouth 2 (two) times daily. 12 capsule 2   esomeprazole (NEXIUM) 40 MG capsule TAKE ONE CAPSULE BY MOUTH DAILY BEFORE BREAKFAST 30 capsule 1   fentaNYL (DURAGESIC) 25 MCG/HR Place 1 patch onto the skin every 3 (three) days. 10 patch 0   furosemide (LASIX) 20 MG tablet Take 1 tablet (20 mg total) by mouth daily. 30 tablet 1   lidocaine (LIDODERM) 5 % Place 1 patch onto the skin daily.  Remove & Discard patch within 12 hours or as directed by MD 30 patch 0   LORazepam (ATIVAN) 0.5 MG tablet Take 1 tablet (0.5 mg total) by mouth every 6 (six) hours as needed (Nausea or vomiting). 30 tablet 0   methocarbamol (ROBAXIN) 500 MG tablet Take 1 tablet (500 mg total) by mouth every 8 (eight) hours as needed for muscle spasms. 30 tablet 0   methylPREDNISolone (MEDROL DOSEPAK) 4 MG TBPK tablet Take 6 pills by mouth day 1, 5 on day 2, 4 on day 3, 3 on day 4, 2 on day 5, 1 on day 6 21 tablet 0   Multiple Vitamin (MULTIVITAMIN) tablet Take 1 tablet by mouth daily.     Omega-3 Fatty Acids (FISH OIL) 1360 MG CAPS Take 1 capsule by mouth daily.     ondansetron (ZOFRAN) 8 MG tablet Take 1 tablet (8 mg total) by mouth every 8 (eight) hours as needed for nausea or vomiting. 30 tablet 0   oxyCODONE (ROXICODONE) 5 MG immediate release tablet Take 1 tablet (5 mg total) by mouth every 4 (four) hours as needed for severe pain or moderate pain. Take 1 to 2 tablets every 4 hours as needed for moderate to severe pain. 60 tablet 0   prochlorperazine (COMPAZINE) 10 MG tablet TAKE ONE TABLET BY MOUTH EVERY 6 HOURS AS NEEDED FOR NAUSEA AND VOMITING 30 tablet 1   REVLIMID 15 MG capsule TAKE 1 CAPSULE (15 mg) DAILY FOR 21 DAYS ON THEN 7 DAYS OFF 21 capsule 0   senna-docusate (SENNA S) 8.6-50 MG tablet Take 2 tablets by mouth at bedtime. 60 tablet 1   terbinafine (LAMISIL) 1 % cream Apply 1 application topically 2 (two) times daily.     No current facility-administered medications for this visit.   REVIEW OF SYSTEMS:  10 Point review of Systems was done is negative except as noted above.  PHYSICAL EXAMINATION: .BP 135/82 (BP Location: Left Arm, Patient Position: Sitting)   Pulse 92   Temp 97.9 F (36.6 C) (Temporal)   Resp 18   Ht 5\' 7"  (1.702 m)   Wt 192 lb 1.6 oz (87.1 kg)   SpO2 99%   BMI 30.09  kg/m   GENERAL:alert, in no acute distress and comfortable SKIN: no acute rashes, no significant  lesions EYES: conjunctiva are pink and non-injected, sclera anicteric OROPHARYNX: MMM, no exudates, no oropharyngeal erythema or ulceration NECK: supple, no JVD LYMPH:  no palpable lymphadenopathy in the cervical, axillary or inguinal regions LUNGS: clear to auscultation b/l with normal respiratory effort HEART: regular rate & rhythm ABDOMEN:  normoactive bowel sounds , non tender, not distended. Extremity: no pedal edema PSYCH: alert & oriented x 3 with fluent speech NEURO: no focal motor/sensory deficits  LABORATORY DATA:  I have reviewed the data as listed .    Latest Ref Rng & Units 01/29/2023   12:38 PM 01/16/2023    8:27 AM 12/31/2022   10:52 AM  CBC  WBC 4.0 - 10.5 K/uL 3.7  3.1  4.6   Hemoglobin 13.0 - 17.0 g/dL 16.1  09.6  04.5   Hematocrit 39.0 - 52.0 % 44.6  44.7  43.0   Platelets 150 - 400 K/uL 152  166  134       Latest Ref Rng & Units 01/29/2023   12:38 PM 01/16/2023    8:27 AM 12/31/2022   10:52 AM  CMP  Glucose 70 - 99 mg/dL 409  811  87   BUN 8 - 23 mg/dL 12  19  9    Creatinine 0.61 - 1.24 mg/dL 9.14  7.82  9.56   Sodium 135 - 145 mmol/L 138  139  138   Potassium 3.5 - 5.1 mmol/L 4.1  4.0  3.8   Chloride 98 - 111 mmol/L 107  108  106   CO2 22 - 32 mmol/L 24  23  24    Calcium 8.9 - 10.3 mg/dL 8.9  8.7  8.5   Total Protein 6.5 - 8.1 g/dL  6.1  6.1   Total Bilirubin 0.3 - 1.2 mg/dL  0.6  0.7   Alkaline Phos 38 - 126 U/L  46  48   AST 15 - 41 U/L  12  12   ALT 0 - 44 U/L  12  14     PATHOLOGY Surgical Pathology CASE: [id number] PATIENT: Anthony [name] Bone Marrow Report     Clinical History: Multiple Myeloma     DIAGNOSIS:  BONE MARROW, ASPIRATE, CLOT, CORE: -Hypercellular bone marrow (60%) with erythroid predominant trilineage hematopoiesis and no evidence of residual plasma cell neoplasm (less than 1% plasma cells by manual aspirate differential, and CD138 immunohistochemical analysis of clot and core sections).  PERIPHERAL  BLOOD: -Leukopenia  MICROSCOPIC DESCRIPTION:  PERIPHERAL BLOOD SMEAR: Platelets: Adequate in number, no platelet clumps identified Erythroid: Normocytic normochromic red blood cells Leukocytes: Mild leukopenia, negative for dysplastic granulocytes, blasts or  plasma cells  BONE MARROW ASPIRATE: Cellular Erythroid precursors: Relatively increased, show a full sequence of maturation with occasional abnormal forms (including binucleate forms, megaloblastoid change and nuclear membrane irregularities) Granulocytic precursors: Decreased, show a full sequence of generally orderly maturation Megakaryocytes: Normal in number and morphology Lymphocytes/plasma cells: Not increased   02/06/2022 Molecular pathology   RADIOGRAPHIC STUDIES: I have personally reviewed the radiological images as listed and agreed with the findings in the report. No results found.   ASSESSMENT & PLAN:   62 y.o. very pleasant male with  1. R-ISS stage III multiple myeloma with extensive bone metastases and patholgiic fracture rt shoulder -Recent bone marrow biopsy and aspiration done 02/06/2022 revealed hypercellular bone marrow with plasma cell neoplasm. Cytogenetics-normal karyotype Molecular cytogenetics-TP53 deletion and duplication of 1 q.Marland Kitchen  PLAN:  -Discussed lab results on 01/29/2023 in detail with patient. CBC was stable and showed WBC of 3.7K, hemoglobin of 15.0, and platelets of 152K. -CMP normal -Myeloma panel from 01/16/2023 shows that patient is in remission -continue maintenance Revlimid treatment -no clinical sign or evidence of multiple myeloma progression at this time -will continue to wean down on narcotics as much as possible -Informed patient that narcotics or Revlimid could cause chronic nausea -If Revlimid is causing nausea, may lower dose from 15  MG to 10 MG -Discussed the option to use Hydroxyzine or Scopolamine to improve nausea -Discussed the option to lower the dosage of  Carfilzomib from 70 MG/M2 to 56 MG/M2, though patient would not like to try at this time -Discussed the options of various forms of physical therapy including specialized physical therapy -Will refer patient to cancer rehab program -Advises patient to eat more fruit and vegetables and limit intake of fried/fatty foods to improve nausea -discussed option of Marinol to relieve nausea symptoms -Will continue Carfilzomib infusion treatment -Answered all of patient's questions regarding reactions with other treatments, physical therapy, and treatment refills -will refill D2, Zofran, and Compazine   FOLLOW-UP: Per integrated scheduling  The total time spent in the appointment was 32 minutes* .  All of the patient's questions were answered with apparent satisfaction. The patient knows to call the clinic with any problems, questions or concerns.   Wyvonnia Lora MD MS AAHIVMS [facility] Hematology/Oncology Physician [facility]  .*Total Encounter Time as defined by the Centers for Medicare and Medicaid Services includes, in addition to the face-to-face time of a patient visit (documented in the note above) non-face-to-face time: obtaining and reviewing outside history, ordering and reviewing medications, tests or procedures, care coordination (communications with other health care professionals or caregivers) and documentation in the medical record.   I,Mitra Faeizi,acting as a Neurosurgeon for Wyvonnia Lora, MD.,have documented all relevant documentation on the behalf of Wyvonnia Lora, MD,as directed by  Wyvonnia Lora, MD while in the presence of Wyvonnia Lora, MD.  .I have reviewed the above documentation for accuracy and completeness, and I agree with the above. Johney Maine MD

## 2023-01-29 NOTE — Patient Instructions (Signed)
[facility]  Discharge Instructions: Thank you for choosing [facility] to provide your oncology and hematology care.   If you have a lab appointment with the Cancer Center, please go directly to the Cancer Center and check in at the registration area.   Wear comfortable clothing and clothing appropriate for easy access to any Portacath or PICC line.   We strive to give you quality time with your provider. You may need to reschedule your appointment if you arrive late (15 or more minutes).  Arriving late affects you and other patients whose appointments are after yours.  Also, if you miss three or more appointments without notifying the office, you may be dismissed from the clinic at the provider's discretion.      For prescription refill requests, have your pharmacy contact our office and allow 72 hours for refills to be completed.    Today you received the following chemotherapy and/or immunotherapy agents :  Kyprolis   To help prevent nausea and vomiting after your treatment, we encourage you to take your nausea medication as directed.  BELOW ARE SYMPTOMS THAT SHOULD BE REPORTED IMMEDIATELY: *FEVER GREATER THAN 100.4 F (38 C) OR HIGHER *CHILLS OR SWEATING *NAUSEA AND VOMITING THAT IS NOT CONTROLLED WITH YOUR NAUSEA MEDICATION *UNUSUAL SHORTNESS OF BREATH *UNUSUAL BRUISING OR BLEEDING *URINARY PROBLEMS (pain or burning when urinating, or frequent urination) *BOWEL PROBLEMS (unusual diarrhea, constipation, pain near the anus) TENDERNESS IN MOUTH AND THROAT WITH OR WITHOUT PRESENCE OF ULCERS (sore throat, sores in mouth, or a toothache) UNUSUAL RASH, SWELLING OR PAIN  UNUSUAL VAGINAL DISCHARGE OR ITCHING   Items with * indicate a potential emergency and should be followed up as soon as possible or go to the Emergency Department if any problems should occur.  Please show the CHEMOTHERAPY ALERT CARD or IMMUNOTHERAPY ALERT CARD at  check-in to the Emergency Department and triage nurse.  Should you have questions after your visit or need to cancel or reschedule your appointment, please contact [facility]  Dept: [phone]  and follow the prompts.  Office hours are 8:00 a.m. to 4:30 p.m. Monday - Friday. Please note that voicemails left after 4:00 p.m. may not be returned until the following business day.  We are closed weekends and major holidays. You have access to a nurse at all times for urgent questions. Please call the main number to the clinic Dept: [phone] and follow the prompts.   For any non-urgent questions, you may also contact your provider using MyChart. We now offer e-Visits for anyone 18 and older to request care online for non-urgent symptoms. For details visit [email or web address].   Also download the MyChart app! Go to the app store, search "MyChart", open the app, select [place], and log in with your MyChart username and password.   

## 2023-02-01 ENCOUNTER — Other Ambulatory Visit: Payer: Self-pay

## 2023-02-04 ENCOUNTER — Other Ambulatory Visit (HOSPITAL_BASED_OUTPATIENT_CLINIC_OR_DEPARTMENT_OTHER): Payer: Self-pay

## 2023-02-04 ENCOUNTER — Other Ambulatory Visit: Payer: Self-pay

## 2023-02-04 ENCOUNTER — Encounter: Payer: Self-pay | Admitting: Hematology

## 2023-02-04 DIAGNOSIS — C9000 Multiple myeloma not having achieved remission: Secondary | ICD-10-CM

## 2023-02-04 MED ORDER — DRONABINOL 5 MG PO CAPS
5.0000 mg | ORAL_CAPSULE | Freq: Two times a day (BID) | ORAL | 0 refills | Status: DC
  Filled 2023-02-04 – 2023-02-07 (×2): qty 60, 30d supply, fill #0

## 2023-02-07 ENCOUNTER — Other Ambulatory Visit (HOSPITAL_BASED_OUTPATIENT_CLINIC_OR_DEPARTMENT_OTHER): Payer: Self-pay

## 2023-02-07 ENCOUNTER — Other Ambulatory Visit: Payer: Self-pay | Admitting: Hematology

## 2023-02-07 DIAGNOSIS — C9000 Multiple myeloma not having achieved remission: Secondary | ICD-10-CM

## 2023-02-10 ENCOUNTER — Encounter: Payer: Self-pay | Admitting: Hematology

## 2023-02-10 ENCOUNTER — Other Ambulatory Visit (HOSPITAL_BASED_OUTPATIENT_CLINIC_OR_DEPARTMENT_OTHER): Payer: Self-pay

## 2023-02-10 ENCOUNTER — Other Ambulatory Visit: Payer: Self-pay

## 2023-02-10 MED ORDER — FENTANYL 25 MCG/HR TD PT72
1.0000 | MEDICATED_PATCH | TRANSDERMAL | 0 refills | Status: DC
Start: 2023-02-10 — End: 2023-03-12

## 2023-02-11 ENCOUNTER — Other Ambulatory Visit: Payer: Self-pay

## 2023-02-12 ENCOUNTER — Inpatient Hospital Stay: Payer: No Typology Code available for payment source

## 2023-02-12 ENCOUNTER — Other Ambulatory Visit (HOSPITAL_BASED_OUTPATIENT_CLINIC_OR_DEPARTMENT_OTHER): Payer: Self-pay

## 2023-02-12 ENCOUNTER — Inpatient Hospital Stay: Payer: No Typology Code available for payment source | Admitting: Hematology

## 2023-02-12 ENCOUNTER — Other Ambulatory Visit: Payer: Self-pay

## 2023-02-12 VITALS — BP 145/89 | HR 66

## 2023-02-12 DIAGNOSIS — C9000 Multiple myeloma not having achieved remission: Secondary | ICD-10-CM

## 2023-02-12 DIAGNOSIS — Z5112 Encounter for antineoplastic immunotherapy: Secondary | ICD-10-CM | POA: Diagnosis not present

## 2023-02-12 DIAGNOSIS — Z7189 Other specified counseling: Secondary | ICD-10-CM

## 2023-02-12 LAB — CBC WITH DIFFERENTIAL (CANCER CENTER ONLY)
Abs Immature Granulocytes: 0.03 10*3/uL (ref 0.00–0.07)
Basophils Absolute: 0 10*3/uL (ref 0.0–0.1)
Basophils Relative: 0 %
Eosinophils Absolute: 0.1 10*3/uL (ref 0.0–0.5)
Eosinophils Relative: 3 %
HCT: 41.3 % (ref 39.0–52.0)
Hemoglobin: 14.2 g/dL (ref 13.0–17.0)
Immature Granulocytes: 1 %
Lymphocytes Relative: 21 %
Lymphs Abs: 1 10*3/uL (ref 0.7–4.0)
MCH: 29 pg (ref 26.0–34.0)
MCHC: 34.4 g/dL (ref 30.0–36.0)
MCV: 84.3 fL (ref 80.0–100.0)
Monocytes Absolute: 0.3 10*3/uL (ref 0.1–1.0)
Monocytes Relative: 6 %
Neutro Abs: 3.5 10*3/uL (ref 1.7–7.7)
Neutrophils Relative %: 69 %
Platelet Count: 180 10*3/uL (ref 150–400)
RBC: 4.9 MIL/uL (ref 4.22–5.81)
RDW: 14.6 % (ref 11.5–15.5)
WBC Count: 5 10*3/uL (ref 4.0–10.5)
nRBC: 0 % (ref 0.0–0.2)

## 2023-02-12 LAB — CMP (CANCER CENTER ONLY)
ALT: 11 U/L (ref 0–44)
AST: 13 U/L — ABNORMAL LOW (ref 15–41)
Albumin: 4 g/dL (ref 3.5–5.0)
Alkaline Phosphatase: 49 U/L (ref 38–126)
Anion gap: 6 (ref 5–15)
BUN: 13 mg/dL (ref 8–23)
CO2: 25 mmol/L (ref 22–32)
Calcium: 9.2 mg/dL (ref 8.9–10.3)
Chloride: 107 mmol/L (ref 98–111)
Creatinine: 0.84 mg/dL (ref 0.61–1.24)
GFR, Estimated: >60 mL/min (ref >60)
Glucose, Bld: 95 mg/dL (ref 70–99)
Potassium: 4.2 mmol/L (ref 3.5–5.1)
Sodium: 138 mmol/L (ref 135–145)
Total Bilirubin: 0.7 mg/dL (ref 0.3–1.2)
Total Protein: 6.3 g/dL — ABNORMAL LOW (ref 6.5–8.1)

## 2023-02-12 MED ORDER — DRONABINOL 10 MG PO CAPS
10.0000 mg | ORAL_CAPSULE | Freq: Two times a day (BID) | ORAL | 0 refills | Status: AC
Start: 2023-02-12 — End: ?
  Filled 2023-02-12: qty 60, 30d supply, fill #0

## 2023-02-12 MED ORDER — ZOLEDRONIC ACID 4 MG/100ML IV SOLN
4.0000 mg | Freq: Once | INTRAVENOUS | Status: AC
  Administered 2023-02-12: 4 mg via INTRAVENOUS
  Filled 2023-02-12: qty 100

## 2023-02-12 MED ORDER — SODIUM CHLORIDE 0.9 % IV SOLN: Freq: Once | INTRAVENOUS | Status: AC

## 2023-02-12 MED ORDER — PALONOSETRON HCL INJECTION 0.25 MG/5ML
0.2500 mg | Freq: Once | INTRAVENOUS | Status: AC
  Administered 2023-02-12: 0.25 mg via INTRAVENOUS
  Filled 2023-02-12: qty 5

## 2023-02-12 MED ORDER — DEXTROSE 5 % IV SOLN
70.0000 mg/m2 | Freq: Once | INTRAVENOUS | Status: AC
  Administered 2023-02-12: 140 mg via INTRAVENOUS
  Filled 2023-02-12: qty 60

## 2023-02-12 MED ORDER — DEXAMETHASONE SODIUM PHOSPHATE 10 MG/ML IJ SOLN
8.0000 mg | Freq: Once | INTRAMUSCULAR | Status: AC
  Administered 2023-02-12: 8 mg via INTRAVENOUS
  Filled 2023-02-12: qty 1

## 2023-02-12 NOTE — Patient Instructions (Signed)
[facility]  Discharge Instructions: Thank you for choosing [facility] to provide your oncology and hematology care.   If you have a lab appointment with the Cancer Center, please go directly to the Cancer Center and check in at the registration area.   Wear comfortable clothing and clothing appropriate for easy access to any Portacath or PICC line.   We strive to give you quality time with your provider. You may need to reschedule your appointment if you arrive late (15 or more minutes).  Arriving late affects you and other patients whose appointments are after yours.  Also, if you miss three or more appointments without notifying the office, you may be dismissed from the clinic at the provider's discretion.      For prescription refill requests, have your pharmacy contact our office and allow 72 hours for refills to be completed.    Today you received the following chemotherapy and/or immunotherapy agents kyprolis, zometa      To help prevent nausea and vomiting after your treatment, we encourage you to take your nausea medication as directed.  BELOW ARE SYMPTOMS THAT SHOULD BE REPORTED IMMEDIATELY: *FEVER GREATER THAN 100.4 F (38 C) OR HIGHER *CHILLS OR SWEATING *NAUSEA AND VOMITING THAT IS NOT CONTROLLED WITH YOUR NAUSEA MEDICATION *UNUSUAL SHORTNESS OF BREATH *UNUSUAL BRUISING OR BLEEDING *URINARY PROBLEMS (pain or burning when urinating, or frequent urination) *BOWEL PROBLEMS (unusual diarrhea, constipation, pain near the anus) TENDERNESS IN MOUTH AND THROAT WITH OR WITHOUT PRESENCE OF ULCERS (sore throat, sores in mouth, or a toothache) UNUSUAL RASH, SWELLING OR PAIN  UNUSUAL VAGINAL DISCHARGE OR ITCHING   Items with * indicate a potential emergency and should be followed up as soon as possible or go to the Emergency Department if any problems should occur.  Please show the CHEMOTHERAPY ALERT CARD or IMMUNOTHERAPY ALERT CARD at  check-in to the Emergency Department and triage nurse.  Should you have questions after your visit or need to cancel or reschedule your appointment, please contact [facility]  Dept: [phone]  and follow the prompts.  Office hours are 8:00 a.m. to 4:30 p.m. Monday - Friday. Please note that voicemails left after 4:00 p.m. may not be returned until the following business day.  We are closed weekends and major holidays. You have access to a nurse at all times for urgent questions. Please call the main number to the clinic Dept: [phone] and follow the prompts.   For any non-urgent questions, you may also contact your provider using MyChart. We now offer e-Visits for anyone 45 and older to request care online for non-urgent symptoms. For details visit [email or web address].   Also download the MyChart app! Go to the app store, search "MyChart", open the app, select [place], and log in with your MyChart username and password.

## 2023-02-13 ENCOUNTER — Other Ambulatory Visit: Payer: Self-pay | Admitting: Hematology

## 2023-02-13 DIAGNOSIS — C9000 Multiple myeloma not having achieved remission: Secondary | ICD-10-CM

## 2023-02-14 ENCOUNTER — Other Ambulatory Visit: Payer: Self-pay

## 2023-02-14 ENCOUNTER — Encounter: Payer: Self-pay | Admitting: Hematology

## 2023-02-14 MED ORDER — METHOCARBAMOL 500 MG PO TABS: 500.0000 mg | ORAL_TABLET | Freq: Three times a day (TID) | ORAL | 0 refills | Status: DC | PRN

## 2023-02-17 ENCOUNTER — Other Ambulatory Visit: Payer: Self-pay | Admitting: Hematology

## 2023-02-17 ENCOUNTER — Encounter: Payer: Self-pay | Admitting: Hematology

## 2023-02-17 DIAGNOSIS — C9000 Multiple myeloma not having achieved remission: Secondary | ICD-10-CM

## 2023-02-17 MED ORDER — OXYCODONE HCL 5 MG PO TABS
5.0000 mg | ORAL_TABLET | ORAL | 0 refills | Status: DC | PRN
Start: 2023-02-17 — End: 2023-03-12

## 2023-02-18 ENCOUNTER — Encounter: Payer: Self-pay | Admitting: Hematology

## 2023-02-21 ENCOUNTER — Other Ambulatory Visit: Payer: Self-pay

## 2023-02-21 DIAGNOSIS — J309 Allergic rhinitis, unspecified: Secondary | ICD-10-CM | POA: Diagnosis not present

## 2023-02-21 DIAGNOSIS — C9000 Multiple myeloma not having achieved remission: Secondary | ICD-10-CM

## 2023-02-21 DIAGNOSIS — H6991 Unspecified Eustachian tube disorder, right ear: Secondary | ICD-10-CM | POA: Diagnosis not present

## 2023-02-21 DIAGNOSIS — T700XXA Otitic barotrauma, initial encounter: Secondary | ICD-10-CM | POA: Diagnosis not present

## 2023-02-21 DIAGNOSIS — H903 Sensorineural hearing loss, bilateral: Secondary | ICD-10-CM | POA: Diagnosis not present

## 2023-02-21 DIAGNOSIS — K219 Gastro-esophageal reflux disease without esophagitis: Secondary | ICD-10-CM | POA: Diagnosis not present

## 2023-02-21 MED ORDER — REVLIMID 15 MG PO CAPS
ORAL_CAPSULE | ORAL | 0 refills | Status: DC
Start: 2023-02-21 — End: 2023-03-20

## 2023-02-22 ENCOUNTER — Other Ambulatory Visit: Payer: Self-pay | Admitting: Hematology

## 2023-02-22 DIAGNOSIS — C9000 Multiple myeloma not having achieved remission: Secondary | ICD-10-CM

## 2023-02-25 ENCOUNTER — Encounter: Payer: Self-pay | Admitting: Hematology

## 2023-02-25 ENCOUNTER — Other Ambulatory Visit: Payer: Self-pay | Admitting: Hematology

## 2023-02-25 DIAGNOSIS — C9000 Multiple myeloma not having achieved remission: Secondary | ICD-10-CM

## 2023-02-26 ENCOUNTER — Other Ambulatory Visit: Payer: Self-pay

## 2023-02-26 ENCOUNTER — Encounter: Payer: Self-pay | Admitting: Hematology

## 2023-02-26 ENCOUNTER — Inpatient Hospital Stay: Payer: BC Managed Care – PPO

## 2023-02-26 ENCOUNTER — Inpatient Hospital Stay: Payer: BC Managed Care – PPO | Attending: Physician Assistant

## 2023-02-26 ENCOUNTER — Inpatient Hospital Stay: Payer: BC Managed Care – PPO | Admitting: Dietician

## 2023-02-26 VITALS — BP 133/83 | HR 66 | Temp 98.6°F | Resp 16 | Wt 191.5 lb

## 2023-02-26 DIAGNOSIS — Z7189 Other specified counseling: Secondary | ICD-10-CM

## 2023-02-26 DIAGNOSIS — M25511 Pain in right shoulder: Secondary | ICD-10-CM | POA: Insufficient documentation

## 2023-02-26 DIAGNOSIS — G8929 Other chronic pain: Secondary | ICD-10-CM | POA: Insufficient documentation

## 2023-02-26 DIAGNOSIS — Z87891 Personal history of nicotine dependence: Secondary | ICD-10-CM | POA: Diagnosis not present

## 2023-02-26 DIAGNOSIS — C9000 Multiple myeloma not having achieved remission: Secondary | ICD-10-CM | POA: Insufficient documentation

## 2023-02-26 DIAGNOSIS — M545 Low back pain, unspecified: Secondary | ICD-10-CM | POA: Insufficient documentation

## 2023-02-26 DIAGNOSIS — R059 Cough, unspecified: Secondary | ICD-10-CM | POA: Diagnosis not present

## 2023-02-26 DIAGNOSIS — R0602 Shortness of breath: Secondary | ICD-10-CM | POA: Diagnosis not present

## 2023-02-26 DIAGNOSIS — R110 Nausea: Secondary | ICD-10-CM | POA: Insufficient documentation

## 2023-02-26 DIAGNOSIS — Z5112 Encounter for antineoplastic immunotherapy: Secondary | ICD-10-CM | POA: Insufficient documentation

## 2023-02-26 DIAGNOSIS — R600 Localized edema: Secondary | ICD-10-CM | POA: Diagnosis not present

## 2023-02-26 LAB — CBC WITH DIFFERENTIAL (CANCER CENTER ONLY)
Abs Immature Granulocytes: 0.03 10*3/uL (ref 0.00–0.07)
Basophils Absolute: 0 10*3/uL (ref 0.0–0.1)
Basophils Relative: 1 %
Eosinophils Absolute: 0.3 10*3/uL (ref 0.0–0.5)
Eosinophils Relative: 9 %
HCT: 43.1 % (ref 39.0–52.0)
Hemoglobin: 14.6 g/dL (ref 13.0–17.0)
Immature Granulocytes: 1 %
Lymphocytes Relative: 27 %
Lymphs Abs: 1 10*3/uL (ref 0.7–4.0)
MCH: 28.6 pg (ref 26.0–34.0)
MCHC: 33.9 g/dL (ref 30.0–36.0)
MCV: 84.5 fL (ref 80.0–100.0)
Monocytes Absolute: 0.5 10*3/uL (ref 0.1–1.0)
Monocytes Relative: 12 %
Neutro Abs: 2 10*3/uL (ref 1.7–7.7)
Neutrophils Relative %: 50 %
Platelet Count: 155 10*3/uL (ref 150–400)
RBC: 5.1 MIL/uL (ref 4.22–5.81)
RDW: 14.8 % (ref 11.5–15.5)
WBC Count: 3.8 10*3/uL — ABNORMAL LOW (ref 4.0–10.5)
nRBC: 0 % (ref 0.0–0.2)

## 2023-02-26 LAB — CMP (CANCER CENTER ONLY)
ALT: 14 U/L (ref 0–44)
AST: 12 U/L — ABNORMAL LOW (ref 15–41)
Albumin: 3.9 g/dL (ref 3.5–5.0)
Alkaline Phosphatase: 50 U/L (ref 38–126)
Anion gap: 9 (ref 5–15)
BUN: 14 mg/dL (ref 8–23)
CO2: 23 mmol/L (ref 22–32)
Calcium: 8.9 mg/dL (ref 8.9–10.3)
Chloride: 107 mmol/L (ref 98–111)
Creatinine: 0.9 mg/dL (ref 0.61–1.24)
GFR, Estimated: >60 mL/min (ref >60)
Glucose, Bld: 113 mg/dL — ABNORMAL HIGH (ref 70–99)
Potassium: 3.9 mmol/L (ref 3.5–5.1)
Sodium: 139 mmol/L (ref 135–145)
Total Bilirubin: 0.6 mg/dL (ref 0.3–1.2)
Total Protein: 6.2 g/dL — ABNORMAL LOW (ref 6.5–8.1)

## 2023-02-26 MED ORDER — PALONOSETRON HCL INJECTION 0.25 MG/5ML
0.2500 mg | Freq: Once | INTRAVENOUS | Status: AC
  Administered 2023-02-26: 0.25 mg via INTRAVENOUS
  Filled 2023-02-26: qty 5

## 2023-02-26 MED ORDER — ACETAMINOPHEN 325 MG PO TABS
650.0000 mg | ORAL_TABLET | Freq: Once | ORAL | Status: AC
  Administered 2023-02-26: 650 mg via ORAL
  Filled 2023-02-26: qty 2

## 2023-02-26 MED ORDER — SODIUM CHLORIDE 0.9 % IV SOLN: Freq: Once | INTRAVENOUS | Status: AC

## 2023-02-26 MED ORDER — DEXAMETHASONE SODIUM PHOSPHATE 10 MG/ML IJ SOLN
8.0000 mg | Freq: Once | INTRAMUSCULAR | Status: AC
  Administered 2023-02-26: 8 mg via INTRAVENOUS
  Filled 2023-02-26: qty 1

## 2023-02-26 MED ORDER — DEXTROSE 5 % IV SOLN
70.0000 mg/m2 | Freq: Once | INTRAVENOUS | Status: AC
  Administered 2023-02-26: 140 mg via INTRAVENOUS
  Filled 2023-02-26: qty 60

## 2023-02-26 NOTE — Progress Notes (Signed)
Nutrition Follow-up:  Patient with multiple myeloma. He is receiving kyprolis + darzalex q28d (start 04/07/22)   Met with patient in infusion. He reports doing well. Patient has a good appetite. Endorses intermittent nausea which has improved. Patient is drinking 1-2 protein shakes. Patient recently purchased protein shakes from [place]. Has not tried these yet. He denies constipation, diarrhea, vomiting.    Medications: reviewed  Labs: reviewed  Anthropometrics: Wt 191 lb 8 oz today - stable  6/26 - 192 lb 8 oz 6/12 - 192 lb 1.6 oz 5/30 - 187 lb  5/1 - 190 lb 4/17 - 192 lb 8 oz  3/7 - 198 lb 8 oz    NUTRITION DIAGNOSIS: Unintended wt loss continues    INTERVENTION:  Continue drinking 1-2 protein shakes daily for wt maintenance    MONITORING, EVALUATION, GOAL: weight trends, intake   NEXT VISIT: Wednesday August 7 during infusion

## 2023-02-26 NOTE — Patient Instructions (Signed)
[facility]  Discharge Instructions: Thank you for choosing [facility] to provide your oncology and hematology care.   If you have a lab appointment with the Cancer Center, please go directly to the Cancer Center and check in at the registration area.   Wear comfortable clothing and clothing appropriate for easy access to any Portacath or PICC line.   We strive to give you quality time with your provider. You may need to reschedule your appointment if you arrive late (15 or more minutes).  Arriving late affects you and other patients whose appointments are after yours.  Also, if you miss three or more appointments without notifying the office, you may be dismissed from the clinic at the provider's discretion.      For prescription refill requests, have your pharmacy contact our office and allow 72 hours for refills to be completed.    Today you received the following chemotherapy and/or immunotherapy agents : Kyprolis      To help prevent nausea and vomiting after your treatment, we encourage you to take your nausea medication as directed.  BELOW ARE SYMPTOMS THAT SHOULD BE REPORTED IMMEDIATELY: *FEVER GREATER THAN 100.4 F (38 C) OR HIGHER *CHILLS OR SWEATING *NAUSEA AND VOMITING THAT IS NOT CONTROLLED WITH YOUR NAUSEA MEDICATION *UNUSUAL SHORTNESS OF BREATH *UNUSUAL BRUISING OR BLEEDING *URINARY PROBLEMS (pain or burning when urinating, or frequent urination) *BOWEL PROBLEMS (unusual diarrhea, constipation, pain near the anus) TENDERNESS IN MOUTH AND THROAT WITH OR WITHOUT PRESENCE OF ULCERS (sore throat, sores in mouth, or a toothache) UNUSUAL RASH, SWELLING OR PAIN  UNUSUAL VAGINAL DISCHARGE OR ITCHING   Items with * indicate a potential emergency and should be followed up as soon as possible or go to the Emergency Department if any problems should occur.  Please show the CHEMOTHERAPY ALERT CARD or IMMUNOTHERAPY ALERT CARD at  check-in to the Emergency Department and triage nurse.  Should you have questions after your visit or need to cancel or reschedule your appointment, please contact [facility]  Dept: [phone]  and follow the prompts.  Office hours are 8:00 a.m. to 4:30 p.m. Monday - Friday. Please note that voicemails left after 4:00 p.m. may not be returned until the following business day.  We are closed weekends and major holidays. You have access to a nurse at all times for urgent questions. Please call the main number to the clinic Dept: [phone] and follow the prompts.   For any non-urgent questions, you may also contact your provider using MyChart. We now offer e-Visits for anyone 18 and older to request care online for non-urgent symptoms. For details visit [email or web address].   Also download the MyChart app! Go to the app store, search "MyChart", open the app, select [place], and log in with your MyChart username and password.  Zoledronic Acid Injection (Cancer) What is this medication? ZOLEDRONIC ACID (ZOE le dron ik AS id) treats high calcium levels in the blood caused by cancer. It may also be used with chemotherapy to treat weakened bones caused by cancer. It works by slowing down the release of calcium from bones. This lowers calcium levels in your blood. It also makes your bones stronger and less likely to break (fracture). It belongs to a group of medications called bisphosphonates. This medicine may be used for other purposes; ask your health care provider or pharmacist if you have questions. COMMON BRAND NAME(S): Zometa, Zometa Powder What   should I tell my care team before I take this medication? They need to know if you have any of these conditions: Dehydration Dental disease Kidney disease Liver disease Low levels of calcium in the blood Lung or breathing disease, such as asthma Receiving steroids, such as dexamethasone or prednisone An  unusual or allergic reaction to zoledronic acid, other medications, foods, dyes, or preservatives Pregnant or trying to get pregnant Breast-feeding How should I use this medication? This medication is injected into a vein. It is given by your care team in a hospital or clinic setting. Talk to your care team about the use of this medication in children. Special care may be needed. Overdosage: If you think you have taken too much of this medicine contact a poison control center or emergency room at once. NOTE: This medicine is only for you. Do not share this medicine with others. What if I miss a dose? Keep appointments for follow-up doses. It is important not to miss your dose. Call your care team if you are unable to keep an appointment. What may interact with this medication? Certain antibiotics given by injection Diuretics, such as bumetanide, furosemide NSAIDs, medications for pain and inflammation, such as ibuprofen or naproxen Teriparatide Thalidomide This list may not describe all possible interactions. Give your health care provider a list of all the medicines, herbs, non-prescription drugs, or dietary supplements you use. Also tell them if you smoke, drink alcohol, or use illegal drugs. Some items may interact with your medicine. What should I watch for while using this medication? Visit your care team for regular checks on your progress. It may be some time before you see the benefit from this medication. Some people who take this medication have severe bone, joint, or muscle pain. This medication may also increase your risk for jaw problems or a broken thigh bone. Tell your care team right away if you have severe pain in your jaw, bones, joints, or muscles. Tell you care team if you have any pain that does not go away or that gets worse. Tell your dentist and dental surgeon that you are taking this medication. You should not have major dental surgery while on this medication. See your  dentist to have a dental exam and fix any dental problems before starting this medication. Take good care of your teeth while on this medication. Make sure you see your dentist for regular follow-up appointments. You should make sure you get enough calcium and vitamin D while you are taking this medication. Discuss the foods you eat and the vitamins you take with your care team. Check with your care team if you have severe diarrhea, nausea, and vomiting, or if you sweat a lot. The loss of too much body fluid may make it dangerous for you to take this medication. You may need bloodwork while taking this medication. Talk to your care team if you wish to become pregnant or think you might be pregnant. This medication can cause serious birth defects. What side effects may I notice from receiving this medication? Side effects that you should report to your care team as soon as possible: Allergic reactions--skin rash, itching, hives, swelling of the face, lips, tongue, or throat Kidney injury--decrease in the amount of urine, swelling of the ankles, hands, or feet Low calcium level--muscle pain or cramps, confusion, tingling, or numbness in the hands or feet Osteonecrosis of the jaw--pain, swelling, or redness in the mouth, numbness of the jaw, poor healing after dental work, unusual   discharge from the mouth, visible bones in the mouth Severe bone, joint, or muscle pain Side effects that usually do not require medical attention (report to your care team if they continue or are bothersome): Constipation Fatigue Fever Loss of appetite Nausea Stomach pain This list may not describe all possible side effects. Call your doctor for medical advice about side effects. You may report side effects to FDA at 1-800-FDA-1088. Where should I keep my medication? This medication is given in a hospital or clinic. It will not be stored at home. NOTE: This sheet is a summary. It may not cover all possible information.  If you have questions about this medicine, talk to your doctor, pharmacist, or health care provider.  2024 Elsevier/Gold Standard (2021-09-28 00:00:00)  

## 2023-03-02 ENCOUNTER — Other Ambulatory Visit: Payer: Self-pay

## 2023-03-03 LAB — MULTIPLE MYELOMA PANEL, SERUM
Albumin SerPl Elph-Mcnc: 3.7 g/dL (ref 2.9–4.4)
Albumin/Glob SerPl: 1.7 (ref 0.7–1.7)
Alpha 1: 0.3 g/dL (ref 0.0–0.4)
Alpha2 Glob SerPl Elph-Mcnc: 0.6 g/dL (ref 0.4–1.0)
B-Globulin SerPl Elph-Mcnc: 0.9 g/dL (ref 0.7–1.3)
Gamma Glob SerPl Elph-Mcnc: 0.6 g/dL (ref 0.4–1.8)
Globulin, Total: 2.3 g/dL (ref 2.2–3.9)
IgA: 41 mg/dL — ABNORMAL LOW (ref 61–437)
IgG (Immunoglobin G), Serum: 533 mg/dL — ABNORMAL LOW (ref 603–1613)
IgM (Immunoglobulin M), Srm: 14 mg/dL — ABNORMAL LOW (ref 20–172)
Total Protein ELP: 6 g/dL (ref 6.0–8.5)

## 2023-03-12 ENCOUNTER — Encounter: Payer: Self-pay | Admitting: Hematology

## 2023-03-12 ENCOUNTER — Other Ambulatory Visit: Payer: Self-pay | Admitting: Hematology

## 2023-03-12 ENCOUNTER — Inpatient Hospital Stay: Payer: BC Managed Care – PPO

## 2023-03-12 ENCOUNTER — Inpatient Hospital Stay (HOSPITAL_BASED_OUTPATIENT_CLINIC_OR_DEPARTMENT_OTHER): Payer: BC Managed Care – PPO | Admitting: Physician Assistant

## 2023-03-12 ENCOUNTER — Other Ambulatory Visit: Payer: Self-pay

## 2023-03-12 VITALS — BP 134/82 | HR 76 | Temp 97.5°F | Resp 20 | Wt 193.2 lb

## 2023-03-12 VITALS — BP 122/77 | HR 67 | Resp 18

## 2023-03-12 DIAGNOSIS — R110 Nausea: Secondary | ICD-10-CM | POA: Diagnosis not present

## 2023-03-12 DIAGNOSIS — R059 Cough, unspecified: Secondary | ICD-10-CM | POA: Diagnosis not present

## 2023-03-12 DIAGNOSIS — C9000 Multiple myeloma not having achieved remission: Secondary | ICD-10-CM

## 2023-03-12 DIAGNOSIS — R5383 Other fatigue: Secondary | ICD-10-CM

## 2023-03-12 DIAGNOSIS — G8929 Other chronic pain: Secondary | ICD-10-CM | POA: Diagnosis not present

## 2023-03-12 DIAGNOSIS — Z7189 Other specified counseling: Secondary | ICD-10-CM

## 2023-03-12 DIAGNOSIS — R0602 Shortness of breath: Secondary | ICD-10-CM | POA: Diagnosis not present

## 2023-03-12 DIAGNOSIS — Z5112 Encounter for antineoplastic immunotherapy: Secondary | ICD-10-CM | POA: Diagnosis not present

## 2023-03-12 DIAGNOSIS — M545 Low back pain, unspecified: Secondary | ICD-10-CM | POA: Diagnosis not present

## 2023-03-12 DIAGNOSIS — M25511 Pain in right shoulder: Secondary | ICD-10-CM | POA: Diagnosis not present

## 2023-03-12 DIAGNOSIS — Z87891 Personal history of nicotine dependence: Secondary | ICD-10-CM | POA: Diagnosis not present

## 2023-03-12 DIAGNOSIS — R600 Localized edema: Secondary | ICD-10-CM | POA: Diagnosis not present

## 2023-03-12 LAB — CMP (CANCER CENTER ONLY)
ALT: 12 U/L (ref 0–44)
AST: 13 U/L — ABNORMAL LOW (ref 15–41)
Albumin: 4.3 g/dL (ref 3.5–5.0)
Alkaline Phosphatase: 51 U/L (ref 38–126)
Anion gap: 7 (ref 5–15)
BUN: 11 mg/dL (ref 8–23)
CO2: 25 mmol/L (ref 22–32)
Calcium: 9.3 mg/dL (ref 8.9–10.3)
Chloride: 104 mmol/L (ref 98–111)
Creatinine: 0.84 mg/dL (ref 0.61–1.24)
GFR, Estimated: >60 mL/min (ref >60)
Glucose, Bld: 127 mg/dL — ABNORMAL HIGH (ref 70–99)
Potassium: 3.8 mmol/L (ref 3.5–5.1)
Sodium: 136 mmol/L (ref 135–145)
Total Bilirubin: 0.6 mg/dL (ref 0.3–1.2)
Total Protein: 6 g/dL — ABNORMAL LOW (ref 6.5–8.1)

## 2023-03-12 LAB — CBC WITH DIFFERENTIAL (CANCER CENTER ONLY)
Abs Immature Granulocytes: 0.01 10*3/uL (ref 0.00–0.07)
Basophils Absolute: 0.1 10*3/uL (ref 0.0–0.1)
Basophils Relative: 1 %
Eosinophils Absolute: 0.2 10*3/uL (ref 0.0–0.5)
Eosinophils Relative: 5 %
HCT: 44.1 % (ref 39.0–52.0)
Hemoglobin: 15 g/dL (ref 13.0–17.0)
Immature Granulocytes: 0 %
Lymphocytes Relative: 28 %
Lymphs Abs: 1.2 10*3/uL (ref 0.7–4.0)
MCH: 28.5 pg (ref 26.0–34.0)
MCHC: 34 g/dL (ref 30.0–36.0)
MCV: 83.7 fL (ref 80.0–100.0)
Monocytes Absolute: 0.3 10*3/uL (ref 0.1–1.0)
Monocytes Relative: 8 %
Neutro Abs: 2.3 10*3/uL (ref 1.7–7.7)
Neutrophils Relative %: 58 %
Platelet Count: 188 10*3/uL (ref 150–400)
RBC: 5.27 MIL/uL (ref 4.22–5.81)
RDW: 14.9 % (ref 11.5–15.5)
WBC Count: 4.1 10*3/uL (ref 4.0–10.5)
nRBC: 0 % (ref 0.0–0.2)

## 2023-03-12 MED ORDER — ZOLEDRONIC ACID 4 MG/100ML IV SOLN
4.0000 mg | Freq: Once | INTRAVENOUS | Status: AC
  Administered 2023-03-12: 4 mg via INTRAVENOUS
  Filled 2023-03-12: qty 100

## 2023-03-12 MED ORDER — SODIUM CHLORIDE 0.9 % IV SOLN: Freq: Once | INTRAVENOUS | Status: DC

## 2023-03-12 MED ORDER — DEXAMETHASONE SODIUM PHOSPHATE 10 MG/ML IJ SOLN
8.0000 mg | Freq: Once | INTRAMUSCULAR | Status: AC
  Administered 2023-03-12: 8 mg via INTRAVENOUS
  Filled 2023-03-12: qty 1

## 2023-03-12 MED ORDER — SODIUM CHLORIDE 0.9 % IV SOLN: Freq: Once | INTRAVENOUS | Status: AC

## 2023-03-12 MED ORDER — PALONOSETRON HCL INJECTION 0.25 MG/5ML
0.2500 mg | Freq: Once | INTRAVENOUS | Status: AC
  Administered 2023-03-12: 0.25 mg via INTRAVENOUS
  Filled 2023-03-12: qty 5

## 2023-03-12 MED ORDER — DEXTROSE 5 % IV SOLN
70.0000 mg/m2 | Freq: Once | INTRAVENOUS | Status: AC
  Administered 2023-03-12: 140 mg via INTRAVENOUS
  Filled 2023-03-12: qty 60

## 2023-03-12 MED ORDER — OXYCODONE HCL 5 MG PO TABS
5.0000 mg | ORAL_TABLET | ORAL | 0 refills | Status: DC | PRN
Start: 2023-03-12 — End: 2023-03-31

## 2023-03-12 MED ORDER — FENTANYL 25 MCG/HR TD PT72
1.0000 | MEDICATED_PATCH | TRANSDERMAL | 0 refills | Status: DC
Start: 2023-03-12 — End: 2023-04-17

## 2023-03-12 NOTE — Patient Instructions (Signed)
[facility]  Discharge Instructions: Thank you for choosing [facility] to provide your oncology and hematology care.   If you have a lab appointment with the Cancer Center, please go directly to the Cancer Center and check in at the registration area.   Wear comfortable clothing and clothing appropriate for easy access to any Portacath or PICC line.   We strive to give you quality time with your provider. You may need to reschedule your appointment if you arrive late (15 or more minutes).  Arriving late affects you and other patients whose appointments are after yours.  Also, if you miss three or more appointments without notifying the office, you may be dismissed from the clinic at the provider's discretion.      For prescription refill requests, have your pharmacy contact our office and allow 72 hours for refills to be completed.    Today you received the following chemotherapy and/or immunotherapy agents :  Kyprolis   To help prevent nausea and vomiting after your treatment, we encourage you to take your nausea medication as directed.  BELOW ARE SYMPTOMS THAT SHOULD BE REPORTED IMMEDIATELY: *FEVER GREATER THAN 100.4 F (38 C) OR HIGHER *CHILLS OR SWEATING *NAUSEA AND VOMITING THAT IS NOT CONTROLLED WITH YOUR NAUSEA MEDICATION *UNUSUAL SHORTNESS OF BREATH *UNUSUAL BRUISING OR BLEEDING *URINARY PROBLEMS (pain or burning when urinating, or frequent urination) *BOWEL PROBLEMS (unusual diarrhea, constipation, pain near the anus) TENDERNESS IN MOUTH AND THROAT WITH OR WITHOUT PRESENCE OF ULCERS (sore throat, sores in mouth, or a toothache) UNUSUAL RASH, SWELLING OR PAIN  UNUSUAL VAGINAL DISCHARGE OR ITCHING   Items with * indicate a potential emergency and should be followed up as soon as possible or go to the Emergency Department if any problems should occur.  Please show the CHEMOTHERAPY ALERT CARD or IMMUNOTHERAPY ALERT CARD at  check-in to the Emergency Department and triage nurse.  Should you have questions after your visit or need to cancel or reschedule your appointment, please contact [facility]  Dept: [phone]  and follow the prompts.  Office hours are 8:00 a.m. to 4:30 p.m. Monday - Friday. Please note that voicemails left after 4:00 p.m. may not be returned until the following business day.  We are closed weekends and major holidays. You have access to a nurse at all times for urgent questions. Please call the main number to the clinic Dept: [phone] and follow the prompts.   For any non-urgent questions, you may also contact your provider using MyChart. We now offer e-Visits for anyone 18 and older to request care online for non-urgent symptoms. For details visit [email or web address].   Also download the MyChart app! Go to the app store, search "MyChart", open the app, select [place], and log in with your MyChart username and password.   

## 2023-03-12 NOTE — Progress Notes (Signed)
This encounter was created in error - please disregard.

## 2023-03-16 ENCOUNTER — Encounter: Payer: Self-pay | Admitting: Hematology

## 2023-03-16 NOTE — Progress Notes (Signed)
HEMATOLOGY/ONCOLOGY CLINIC NOTE  Date of Service: .03/12/2023   Patient Care Team: Milus Height, PA as PCP - General (Nurse Practitioner)  CHIEF COMPLAINTS/PURPOSE OF CONSULTATION:  Multiple myeloma  INTERVAL HISTORY:  Anthony [name] is a 62 y.o. male who presents for a follow up visit for continued management of multiple myeloma. He was last seen by Dr. Candise Che on 01/29/2023. He presents today before Cycle 12, Day 15 of Carfilzomib/Revimid/Dexamethasone treatment.  Mr. Jupiter reports he has noticed more fatigue and takes 1-2 naps per day. He is able to complete his daily activities on his own. His appetite is unchanged. He reports occasional episodes of nausea for 1-2 days after treatment. He takes compazine and ativan if needed with improvement of nausea. He reports his bowel habits are unchanged without recurrent episodes of diarrhea or constipation. He denies easy bruising or signs of bleeding. He denies fevers, chill, night sweats, shortness of breath, chest pain or cough. He has no other complaints.   MEDICAL HISTORY:  Past Medical History:  Diagnosis Date   Back pain    Cancer (HCC)    Multiple myeloma (HCC) 02/15/2022    SURGICAL HISTORY: Past Surgical History:  Procedure Laterality Date   IR BONE TUMOR(S)RF ABLATION  05/07/2022   IR BONE TUMOR(S)RF ABLATION  05/07/2022   IR BONE TUMOR(S)RF ABLATION  05/07/2022   IR KYPHO EA ADDL LEVEL THORACIC OR LUMBAR  05/07/2022   IR KYPHO LUMBAR INC FX REDUCE BONE BX UNI/BIL CANNULATION INC/IMAGING  05/07/2022   IR KYPHO THORACIC WITH BONE BIOPSY  05/07/2022   IR RADIOLOGIST EVAL & MGMT  04/23/2022   IR RADIOLOGIST EVAL & MGMT  05/17/2022   IR RADIOLOGIST EVAL & MGMT  06/06/2022    SOCIAL HISTORY: Social History   Socioeconomic History   Marital status: Married    Spouse name: Not on file   Number of children: Not on file   Years of education: Not on file   Highest education level: Not on file  Occupational History   Not on  file  Tobacco Use   Smoking status: Never   Smokeless tobacco: Former    Types: Chew, Snuff  Vaping Use   Vaping status: Never Used  Substance and Sexual Activity   Alcohol use: Yes    Alcohol/week: 2.0 standard drinks of alcohol    Types: 2 Glasses of wine per week   Drug use: No   Sexual activity: Yes  Other Topics Concern   Not on file  Social History Narrative   Not on file   Social Determinants of Health   Financial Resource Strain: Low Risk  (05/07/2022)   Overall Financial Resource Strain (CARDIA)    Difficulty of Paying Living Expenses: Not very hard  Food Insecurity: No Food Insecurity (05/07/2022)   Hunger Vital Sign    Worried About Running Out of Food in the Last Year: Never true    Ran Out of Food in the Last Year: Never true  Transportation Needs: No Transportation Needs (05/07/2022)   PRAPARE - Administrator, Civil Service (Medical): No    Lack of Transportation (Non-Medical): No  Physical Activity: Not on file  Stress: Not on file  Social Connections: Not on file  Intimate Partner Violence: Unknown (05/07/2022)   Humiliation, Afraid, Rape, and Kick questionnaire    Fear of Current or Ex-Partner: No    Emotionally Abused: No    Physically Abused: No    Sexually Abused: Not on file  FAMILY HISTORY: Family History  Problem Relation Age of Onset   Rheum arthritis Mother    Other Sister        Pre-cancerous uterine mass;    Rheum arthritis Sister    Bone cancer Paternal Grandfather    Brain cancer Maternal Uncle    Brain cancer Maternal Aunt    Osteoporosis Neg Hx     ALLERGIES:  has No Known Allergies.  MEDICATIONS:  Current Outpatient Medications  Medication Sig Dispense Refill   acyclovir (ZOVIRAX) 400 MG tablet Take 1 tablet (400 mg total) by mouth 2 (two) times daily. 60 tablet 11   aluminum chloride (DRYSOL) 20 % external solution Apply 1 application topically at bedtime.     calcium-vitamin D (OSCAL WITH D) 250-125 MG-UNIT  tablet Take 1 tablet by mouth daily.     cyclobenzaprine (FLEXERIL) 10 MG tablet Take 10 mg by mouth 3 (three) times daily as needed for muscle spasms.     diclofenac sodium (VOLTAREN) 1 % GEL Apply 2 g topically as needed.     dronabinol (MARINOL) 10 MG capsule Take 1 capsule (10 mg total) by mouth 2 (two) times daily before a meal. 60 capsule 0   ELIQUIS 5 MG TABS tablet Take 1 tablet (5 mg total) by mouth 2 (two) times daily. 60 tablet 5   ergocalciferol (VITAMIN D2) 1.25 MG (50000 UT) capsule Take 1 capsule (50,000 Units total) by mouth 2 (two) times a week. 12 capsule 2   esomeprazole (NEXIUM) 40 MG capsule TAKE ONE CAPSULE BY MOUTH DAILY BEFORE BREAKFAST 30 capsule 1   furosemide (LASIX) 20 MG tablet Take 1 tablet (20 mg total) by mouth daily. 30 tablet 1   lidocaine (LIDODERM) 5 % Place 1 patch onto the skin daily. Remove & Discard patch within 12 hours or as directed by MD 30 patch 0   LORazepam (ATIVAN) 0.5 MG tablet Take 1 tablet (0.5 mg total) by mouth every 6 (six) hours as needed (Nausea or vomiting). 30 tablet 0   methocarbamol (ROBAXIN) 500 MG tablet Take 1 tablet (500 mg total) by mouth every 8 (eight) hours as needed for muscle spasms. 30 tablet 0   methylPREDNISolone (MEDROL DOSEPAK) 4 MG TBPK tablet Take 6 pills by mouth day 1, 5 on day 2, 4 on day 3, 3 on day 4, 2 on day 5, 1 on day 6 21 tablet 0   Multiple Vitamin (MULTIVITAMIN) tablet Take 1 tablet by mouth daily.     Omega-3 Fatty Acids (FISH OIL) 1360 MG CAPS Take 1 capsule by mouth daily.     ondansetron (ZOFRAN) 8 MG tablet Take 1 tablet (8 mg total) by mouth every 8 (eight) hours as needed for nausea or vomiting. 30 tablet 0   prochlorperazine (COMPAZINE) 10 MG tablet TAKE ONE TABLET BY MOUTH EVERY 6 HOURS AS NEEDED FOR NAUSEA AND VOMITING 30 tablet 1   REVLIMID 15 MG capsule TAKE 1 CAPSULE (15 mg) DAILY FOR 21 DAYS ON THEN 7 DAYS OFF 21 capsule 0   senna-docusate (SENNA S) 8.6-50 MG tablet Take 2 tablets by mouth at  bedtime. 60 tablet 1   terbinafine (LAMISIL) 1 % cream Apply 1 application topically 2 (two) times daily.     fentaNYL (DURAGESIC) 25 MCG/HR Place 1 patch onto the skin every 3 (three) days. 10 patch 0   oxyCODONE (ROXICODONE) 5 MG immediate release tablet Take 1 tablet (5 mg total) by mouth every 4 (four) hours as needed for severe pain  or moderate pain. Take 1 to 2 tablets every 4 hours as needed for moderate to severe pain. 60 tablet 0   No current facility-administered medications for this visit.    10 Point review of Systems was done is negative except as noted above.  PHYSICAL EXAMINATION: Vitals:   03/12/23 1320  BP: 134/82  Pulse: 76  Resp: 20  Temp: (!) 97.5 F (36.4 C)  SpO2: 98%   NAD GENERAL:alert, in no acute distress and comfortable SKIN: no acute rashes, no significant lesions EYES: conjunctiva are pink and non-injected, sclera anicteric NECK: supple, no JVD LUNGS: clear to auscultation b/l with normal respiratory effort HEART: regular rate & rhythm Extremity: no pedal edema PSYCH: alert & oriented x 3 with fluent speech NEURO: no focal motor/sensory deficits    LABORATORY DATA:  I have reviewed the data as listed .    Latest Ref Rng & Units 03/12/2023   12:58 PM 02/26/2023    7:40 AM 02/12/2023   11:39 AM  CBC  WBC 4.0 - 10.5 K/uL 4.1  3.8  5.0   Hemoglobin 13.0 - 17.0 g/dL 84.1  32.4  40.1   Hematocrit 39.0 - 52.0 % 44.1  43.1  41.3   Platelets 150 - 400 K/uL 188  155  180       Latest Ref Rng & Units 03/12/2023   12:58 PM 02/26/2023    7:40 AM 02/12/2023   11:39 AM  CMP  Glucose 70 - 99 mg/dL 027  253  95   BUN 8 - 23 mg/dL 11  14  13    Creatinine 0.61 - 1.24 mg/dL 6.64  4.03  4.74   Sodium 135 - 145 mmol/L 136  139  138   Potassium 3.5 - 5.1 mmol/L 3.8  3.9  4.2   Chloride 98 - 111 mmol/L 104  107  107   CO2 22 - 32 mmol/L 25  23  25    Calcium 8.9 - 10.3 mg/dL 9.3  8.9  9.2   Total Protein 6.5 - 8.1 g/dL 6.0  6.2  6.3   Total Bilirubin 0.3 -  1.2 mg/dL 0.6  0.6  0.7   Alkaline Phos 38 - 126 U/L 51  50  49   AST 15 - 41 U/L 13  12  13    ALT 0 - 44 U/L 12  14  11      02/06/2022 Molecular pathology   RADIOGRAPHIC STUDIES: I have personally reviewed the radiological images as listed and agreed with the findings in the report. No results found.   ASSESSMENT & PLAN:   62 y.o. very pleasant male with  1. R-ISS stage III multiple myeloma with extensive bone metastases and patholgiic fracture rt shoulder -Bone marrow biopsy and aspiration done 02/06/2022 revealed hypercellular bone marrow with plasma cell neoplasm. 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  Plan -Labs done today were reviewed in detail and adequate for treatment. WBC 4.1, Hgb 15.0, Plt 188, Creatinine normal.  -Most recent myeloma panel from 02/26/2023 showed M spike not detected -No significant new toxicities from his current treatment regimen -Continue carfilzomib/Revlimid/dexamethasone regimen -Continue Revlimid at 15 mg p.o. daily for 3 weeks on 1 week off with Eliquis for VTE prophylaxis -Continue monthly Zometa -Continue a Eliquis for VTE prophylaxis -Continue acyclovir for VZV prophylaxis -Will check thyroid, vitamin D, iron and B12 levels next week to further evaluate cause of fatigue. If workup is negative, advised patient to discuss sleep study with  PCP as patient does have symptoms to suggest OSA.  Follow up: RTC in 4 weeks for a follow up visit with Dr. Candise Che  All of the patient's questions were answered with apparent satisfaction. The patient knows to call the clinic with any problems, questions or concerns.  I have spent a total of 30 minutes minutes of face-to-face and non-face-to-face time, preparing to see the patient, performing a medically appropriate examination, counseling and educating the patient,documenting clinical information in the electronic health record,and care coordination.    Georga Kaufmann PA-C Dept of Hematology and Oncology [facility] Main Phone: [phone]

## 2023-03-20 ENCOUNTER — Other Ambulatory Visit: Payer: Self-pay

## 2023-03-20 DIAGNOSIS — C9000 Multiple myeloma not having achieved remission: Secondary | ICD-10-CM

## 2023-03-20 MED ORDER — REVLIMID 15 MG PO CAPS
ORAL_CAPSULE | ORAL | 0 refills | Status: DC
Start: 2023-03-20 — End: 2023-04-17

## 2023-03-22 IMAGING — DX DG BONE SURVEY MET
8 of 10 series · 8 of 10 positions shown · non-contrast
Comparison: 07/22/2017, 11/13/2021.

CLINICAL DATA: MGUS, osteopenia. Recent history of right shoulder
fracture.

EXAM:
METASTATIC BONE SURVEY

[skull lat]
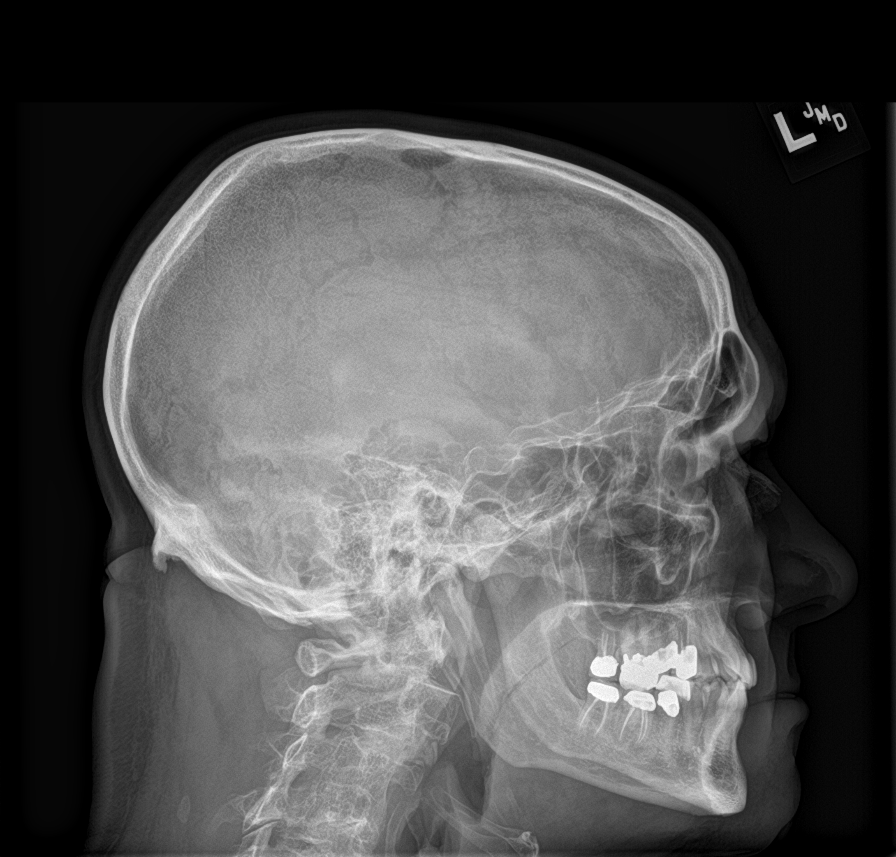

[shoulder ap (1 of 2)]
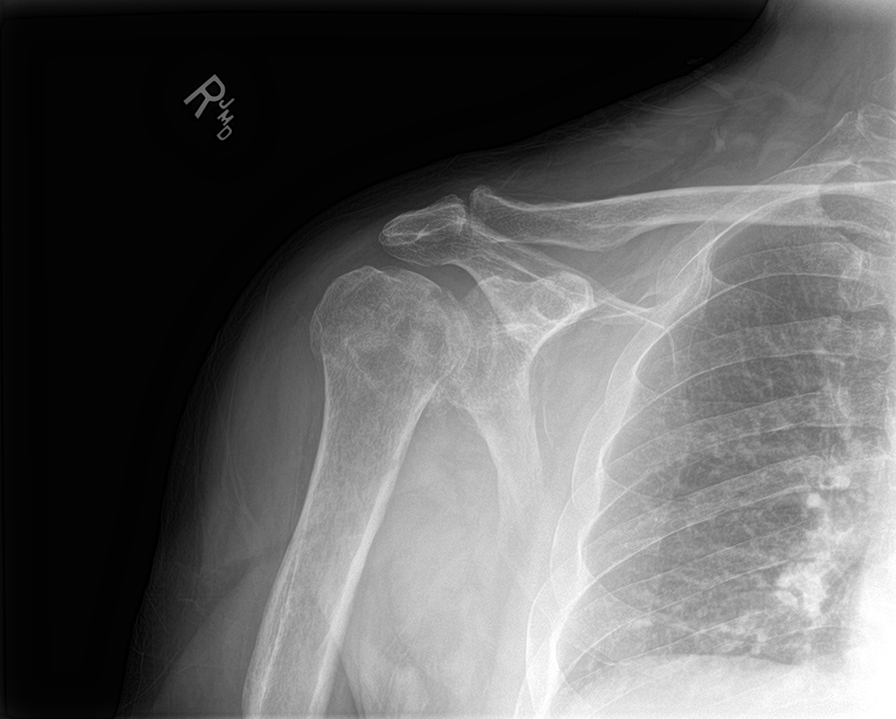

[shoulder ap (2 of 2)]
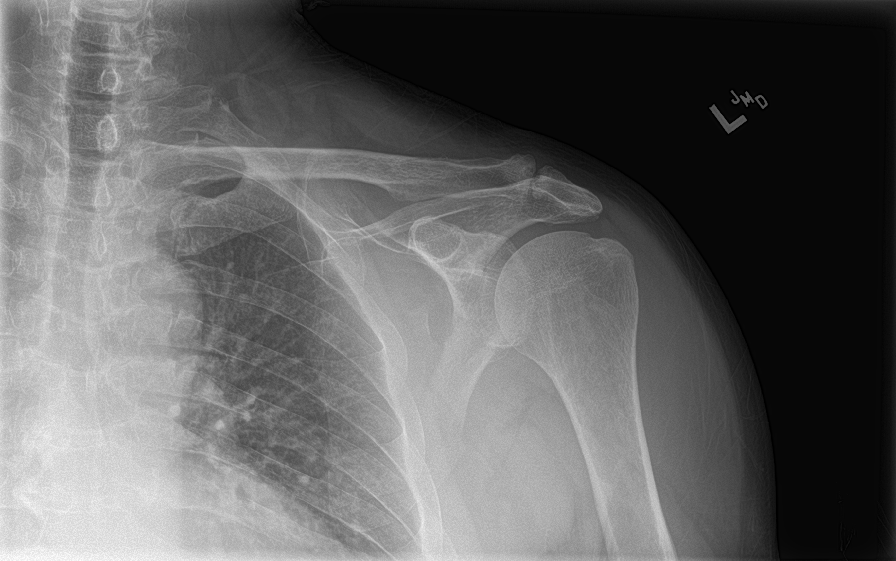

[humerus ap (1 of 2)]
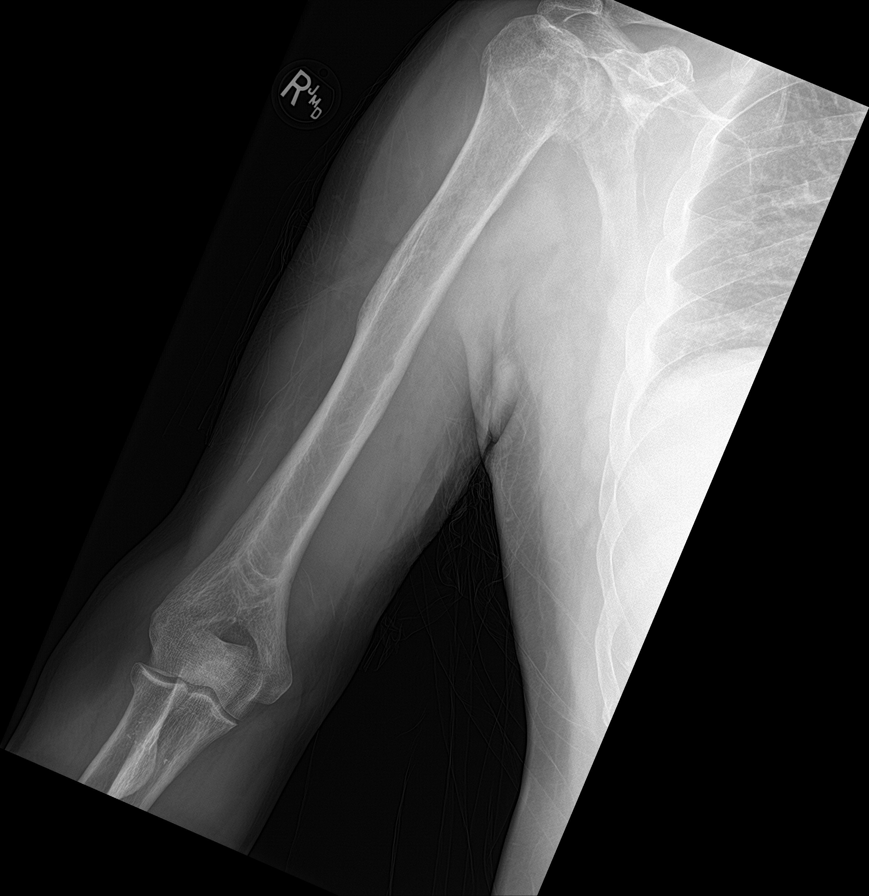

[humerus ap (2 of 2)]
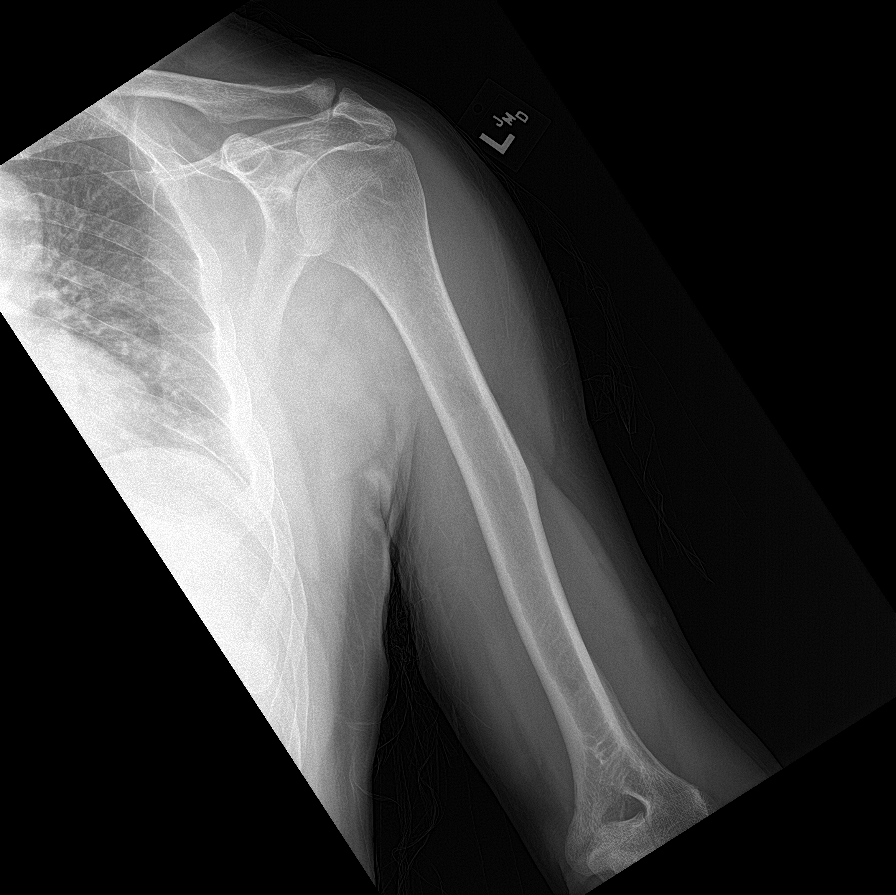

[forearm ap (1 of 2)]
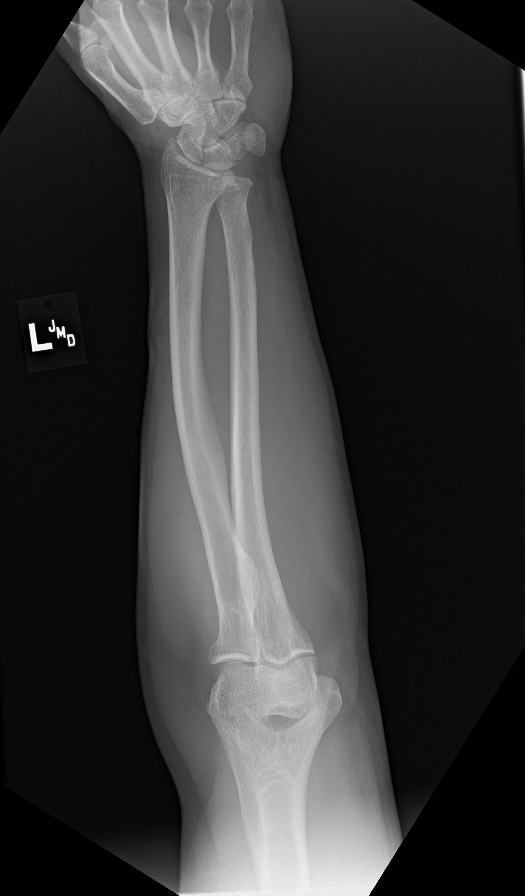

[forearm ap (2 of 2)]
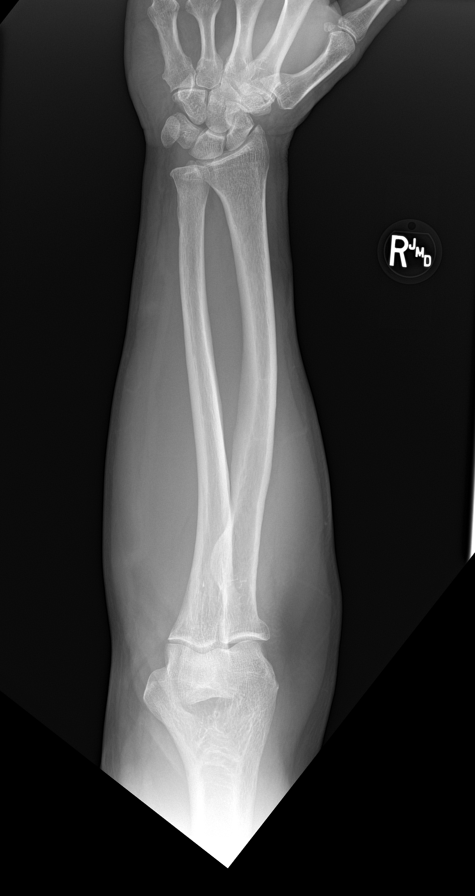

[c-spine ap]
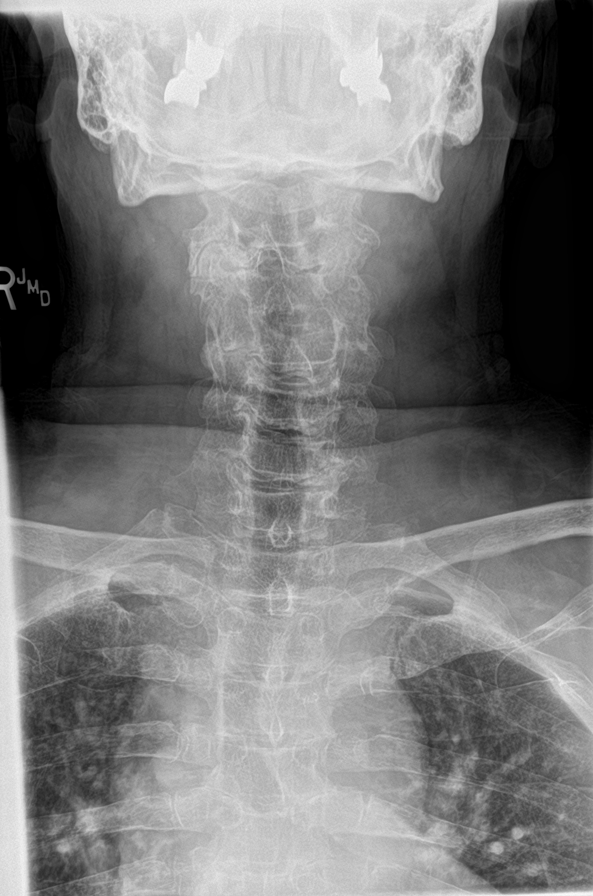

[8 of 10 positions shown; findings below may reference images not displayed]

FINDINGS: Examination is limited due to osteopenia. The previously described
lucencies in the right scapula are not well seen radiographically.
The lucencies present in the right humeral head are not well seen
due to associated comminuted fracture. A few scattered lucency are
noted in the mid humeral shaft on the right. There is a vague
lucency in the distal humeral shaft measuring 6 mm. A stable lytic
lesion is noted in the calvarium which is unchanged from 3471 and
likely benign. No radiographic lucencies are seen in the bilateral
lower extremities, pelvis, or thorax.

Degenerative changes are present in the cervical, thoracic, and
lumbar spine. There is a mild compression deformity in the superior
endplate of L2 with degenerative changes which is likely chronic. A
calcified granuloma projects over the T11 vertebral body on the
lateral view. Mild degenerative changes are noted at the hips
bilaterally
IMPRESSION: 1. Vague lucencies in the humeral shafts bilaterally which are
indeterminate and the possibility of multiple myeloma or metastatic
disease can not be completely excluded.
2. Previously described lucencies in the right scapula and humeral
head are not well visualized radiographically and due to humeral
head fracture.

## 2023-03-26 ENCOUNTER — Encounter: Payer: Self-pay | Admitting: Hematology

## 2023-03-26 ENCOUNTER — Inpatient Hospital Stay: Payer: BC Managed Care – PPO | Admitting: Dietician

## 2023-03-26 ENCOUNTER — Inpatient Hospital Stay: Payer: BC Managed Care – PPO

## 2023-03-26 ENCOUNTER — Inpatient Hospital Stay: Payer: BC Managed Care – PPO | Attending: Physician Assistant

## 2023-03-26 VITALS — BP 136/76 | HR 63 | Temp 97.8°F | Resp 16 | Wt 194.0 lb

## 2023-03-26 DIAGNOSIS — Z7189 Other specified counseling: Secondary | ICD-10-CM

## 2023-03-26 DIAGNOSIS — R0602 Shortness of breath: Secondary | ICD-10-CM | POA: Diagnosis not present

## 2023-03-26 DIAGNOSIS — M545 Low back pain, unspecified: Secondary | ICD-10-CM | POA: Insufficient documentation

## 2023-03-26 DIAGNOSIS — Z808 Family history of malignant neoplasm of other organs or systems: Secondary | ICD-10-CM | POA: Diagnosis not present

## 2023-03-26 DIAGNOSIS — C9000 Multiple myeloma not having achieved remission: Secondary | ICD-10-CM

## 2023-03-26 DIAGNOSIS — M25511 Pain in right shoulder: Secondary | ICD-10-CM | POA: Diagnosis not present

## 2023-03-26 DIAGNOSIS — Z5112 Encounter for antineoplastic immunotherapy: Secondary | ICD-10-CM | POA: Diagnosis not present

## 2023-03-26 DIAGNOSIS — Z87891 Personal history of nicotine dependence: Secondary | ICD-10-CM | POA: Insufficient documentation

## 2023-03-26 DIAGNOSIS — R059 Cough, unspecified: Secondary | ICD-10-CM | POA: Diagnosis not present

## 2023-03-26 DIAGNOSIS — Z809 Family history of malignant neoplasm, unspecified: Secondary | ICD-10-CM | POA: Diagnosis not present

## 2023-03-26 DIAGNOSIS — R5383 Other fatigue: Secondary | ICD-10-CM

## 2023-03-26 LAB — CBC WITH DIFFERENTIAL (CANCER CENTER ONLY)
Abs Immature Granulocytes: 0.02 10*3/uL (ref 0.00–0.07)
Basophils Absolute: 0 10*3/uL (ref 0.0–0.1)
Basophils Relative: 1 %
Eosinophils Absolute: 0.2 10*3/uL (ref 0.0–0.5)
Eosinophils Relative: 7 %
HCT: 42.8 % (ref 39.0–52.0)
Hemoglobin: 14.3 g/dL (ref 13.0–17.0)
Immature Granulocytes: 1 %
Lymphocytes Relative: 31 %
Lymphs Abs: 1 10*3/uL (ref 0.7–4.0)
MCH: 28.1 pg (ref 26.0–34.0)
MCHC: 33.4 g/dL (ref 30.0–36.0)
MCV: 84.3 fL (ref 80.0–100.0)
Monocytes Absolute: 0.5 10*3/uL (ref 0.1–1.0)
Monocytes Relative: 16 %
Neutro Abs: 1.5 10*3/uL — ABNORMAL LOW (ref 1.7–7.7)
Neutrophils Relative %: 44 %
Platelet Count: 133 10*3/uL — ABNORMAL LOW (ref 150–400)
RBC: 5.08 MIL/uL (ref 4.22–5.81)
RDW: 14.9 % (ref 11.5–15.5)
WBC Count: 3.3 10*3/uL — ABNORMAL LOW (ref 4.0–10.5)
nRBC: 0 % (ref 0.0–0.2)

## 2023-03-26 LAB — CMP (CANCER CENTER ONLY)
ALT: 11 U/L (ref 0–44)
AST: 12 U/L — ABNORMAL LOW (ref 15–41)
Albumin: 4 g/dL (ref 3.5–5.0)
Alkaline Phosphatase: 41 U/L (ref 38–126)
Anion gap: 7 (ref 5–15)
BUN: 13 mg/dL (ref 8–23)
CO2: 24 mmol/L (ref 22–32)
Calcium: 8.6 mg/dL — ABNORMAL LOW (ref 8.9–10.3)
Chloride: 104 mmol/L (ref 98–111)
Creatinine: 0.89 mg/dL (ref 0.61–1.24)
GFR, Estimated: >60 mL/min (ref >60)
Glucose, Bld: 119 mg/dL — ABNORMAL HIGH (ref 70–99)
Potassium: 3.9 mmol/L (ref 3.5–5.1)
Sodium: 135 mmol/L (ref 135–145)
Total Bilirubin: 0.9 mg/dL (ref 0.3–1.2)
Total Protein: 5.9 g/dL — ABNORMAL LOW (ref 6.5–8.1)

## 2023-03-26 LAB — IRON AND IRON BINDING CAPACITY (CC-WL,HP ONLY)
Iron: 102 ug/dL (ref 45–182)
Saturation Ratios: 29 % (ref 17.9–39.5)
TIBC: 350 ug/dL (ref 250–450)
UIBC: 248 ug/dL (ref 117–376)

## 2023-03-26 LAB — VITAMIN D 25 HYDROXY (VIT D DEFICIENCY, FRACTURES): Vit D, 25-Hydroxy: 126.35 ng/mL — ABNORMAL HIGH (ref 30–100)

## 2023-03-26 LAB — FERRITIN: Ferritin: 38 ng/mL (ref 24–336)

## 2023-03-26 LAB — TSH: TSH: 3.024 u[IU]/mL (ref 0.350–4.500)

## 2023-03-26 LAB — T4, FREE: Free T4: 1.11 ng/dL (ref 0.61–1.12)

## 2023-03-26 LAB — VITAMIN B12: Vitamin B-12: 187 pg/mL (ref 180–914)

## 2023-03-26 MED ORDER — SODIUM CHLORIDE 0.9 % IV SOLN: Freq: Once | INTRAVENOUS | Status: AC

## 2023-03-26 MED ORDER — DEXAMETHASONE SODIUM PHOSPHATE 10 MG/ML IJ SOLN
8.0000 mg | Freq: Once | INTRAMUSCULAR | Status: AC
  Administered 2023-03-26: 8 mg via INTRAVENOUS
  Filled 2023-03-26: qty 1

## 2023-03-26 MED ORDER — PALONOSETRON HCL INJECTION 0.25 MG/5ML
0.2500 mg | Freq: Once | INTRAVENOUS | Status: AC
  Administered 2023-03-26: 0.25 mg via INTRAVENOUS
  Filled 2023-03-26: qty 5

## 2023-03-26 MED ORDER — ACETAMINOPHEN 325 MG PO TABS
650.0000 mg | ORAL_TABLET | Freq: Once | ORAL | Status: AC
  Administered 2023-03-26: 650 mg via ORAL
  Filled 2023-03-26: qty 2

## 2023-03-26 MED ORDER — DEXTROSE 5 % IV SOLN
70.0000 mg/m2 | Freq: Once | INTRAVENOUS | Status: AC
  Administered 2023-03-26: 140 mg via INTRAVENOUS
  Filled 2023-03-26: qty 60

## 2023-03-26 NOTE — Patient Instructions (Signed)
[facility]  Discharge Instructions: Thank you for choosing [facility] to provide your oncology and hematology care.   If you have a lab appointment with the Cancer Center, please go directly to the Cancer Center and check in at the registration area.   Wear comfortable clothing and clothing appropriate for easy access to any Portacath or PICC line.   We strive to give you quality time with your provider. You may need to reschedule your appointment if you arrive late (15 or more minutes).  Arriving late affects you and other patients whose appointments are after yours.  Also, if you miss three or more appointments without notifying the office, you may be dismissed from the clinic at the provider's discretion.      For prescription refill requests, have your pharmacy contact our office and allow 72 hours for refills to be completed.    Today you received the following chemotherapy and/or immunotherapy agents :  Kyprolis   To help prevent nausea and vomiting after your treatment, we encourage you to take your nausea medication as directed.  BELOW ARE SYMPTOMS THAT SHOULD BE REPORTED IMMEDIATELY: *FEVER GREATER THAN 100.4 F (38 C) OR HIGHER *CHILLS OR SWEATING *NAUSEA AND VOMITING THAT IS NOT CONTROLLED WITH YOUR NAUSEA MEDICATION *UNUSUAL SHORTNESS OF BREATH *UNUSUAL BRUISING OR BLEEDING *URINARY PROBLEMS (pain or burning when urinating, or frequent urination) *BOWEL PROBLEMS (unusual diarrhea, constipation, pain near the anus) TENDERNESS IN MOUTH AND THROAT WITH OR WITHOUT PRESENCE OF ULCERS (sore throat, sores in mouth, or a toothache) UNUSUAL RASH, SWELLING OR PAIN  UNUSUAL VAGINAL DISCHARGE OR ITCHING   Items with * indicate a potential emergency and should be followed up as soon as possible or go to the Emergency Department if any problems should occur.  Please show the CHEMOTHERAPY ALERT CARD or IMMUNOTHERAPY ALERT CARD at  check-in to the Emergency Department and triage nurse.  Should you have questions after your visit or need to cancel or reschedule your appointment, please contact [facility]  Dept: [phone]  and follow the prompts.  Office hours are 8:00 a.m. to 4:30 p.m. Monday - Friday. Please note that voicemails left after 4:00 p.m. may not be returned until the following business day.  We are closed weekends and major holidays. You have access to a nurse at all times for urgent questions. Please call the main number to the clinic Dept: [phone] and follow the prompts.   For any non-urgent questions, you may also contact your provider using MyChart. We now offer e-Visits for anyone 18 and older to request care online for non-urgent symptoms. For details visit [email or web address].   Also download the MyChart app! Go to the app store, search "MyChart", open the app, select [place], and log in with your MyChart username and password.   

## 2023-03-26 NOTE — Progress Notes (Signed)
Nutrition Follow-up:  Patient with multiple myeloma. He is receiving kyprolis + darzalex q28d (start 04/07/22)   Met with patient in infusion. Daughter is present at visit today. Patient reports appetite is ok. It comes and goes as usual. Patient has intermittent nausea. This is mild - well controlled with antiemetics when needed. Patient reports new lower back pain that comes and goes. Endorses recent light work in the yard. Unsure if he pulled a muscle. Patient reports mild swelling in bilateral lower extremities (+1 RLE, +2 LLE per RD exam).    Medications: reviewed   Labs: reviewed   Anthropometrics: Wt 194 lb today - trending up (suspect r/t fluid)  7/24 - 193 lb 3.2 oz 7/10 - 191 lb 8 oz   NUTRITION DIAGNOSIS: Unintended wt loss - stable   INTERVENTION:  Continue drinking 1-2 protein shakes for wt maintenance RN aware of BLE edema/new intermittent back pain - RN to report to PA-C    MONITORING, EVALUATION, GOAL: wt trends, intake   NEXT VISIT: Wednesday September 4 during infusion

## 2023-03-27 ENCOUNTER — Other Ambulatory Visit: Payer: Self-pay | Admitting: Physician Assistant

## 2023-03-27 MED ORDER — VITAMIN B-12 1000 MCG PO TABS: 1000.0000 ug | ORAL_TABLET | Freq: Every day | ORAL | 3 refills | Status: AC

## 2023-03-28 ENCOUNTER — Telehealth: Payer: Self-pay

## 2023-03-28 DIAGNOSIS — T700XXA Otitic barotrauma, initial encounter: Secondary | ICD-10-CM | POA: Diagnosis not present

## 2023-03-28 DIAGNOSIS — H903 Sensorineural hearing loss, bilateral: Secondary | ICD-10-CM | POA: Diagnosis not present

## 2023-03-28 NOTE — Telephone Encounter (Signed)
-----   Message from Briant Cedar sent at 03/27/2023  4:53 PM EDT ----- Please notify patient that his B12 levels are below normal. I have sent a prescription for vitamin B12 supplement. ----- Message ----- From: Interface, Lab In [place] Sent: 03/26/2023   9:26 AM EDT To: Briant Cedar, PA-C

## 2023-03-28 NOTE — Telephone Encounter (Signed)
LM for pt regarding lab results and new RX.

## 2023-03-31 ENCOUNTER — Other Ambulatory Visit: Payer: Self-pay

## 2023-03-31 DIAGNOSIS — C9000 Multiple myeloma not having achieved remission: Secondary | ICD-10-CM

## 2023-03-31 MED ORDER — OXYCODONE HCL 5 MG PO TABS: 5.0000 mg | ORAL_TABLET | ORAL | 0 refills | Status: DC | PRN

## 2023-04-06 ENCOUNTER — Other Ambulatory Visit: Payer: Self-pay

## 2023-04-08 ENCOUNTER — Inpatient Hospital Stay (HOSPITAL_BASED_OUTPATIENT_CLINIC_OR_DEPARTMENT_OTHER): Payer: BC Managed Care – PPO | Admitting: Hematology

## 2023-04-08 ENCOUNTER — Inpatient Hospital Stay: Payer: BC Managed Care – PPO

## 2023-04-08 VITALS — BP 144/96 | HR 67 | Temp 98.4°F | Resp 20 | Wt 197.9 lb

## 2023-04-08 DIAGNOSIS — Z809 Family history of malignant neoplasm, unspecified: Secondary | ICD-10-CM | POA: Diagnosis not present

## 2023-04-08 DIAGNOSIS — M25511 Pain in right shoulder: Secondary | ICD-10-CM | POA: Diagnosis not present

## 2023-04-08 DIAGNOSIS — C9000 Multiple myeloma not having achieved remission: Secondary | ICD-10-CM

## 2023-04-08 DIAGNOSIS — Z7189 Other specified counseling: Secondary | ICD-10-CM

## 2023-04-08 DIAGNOSIS — R0602 Shortness of breath: Secondary | ICD-10-CM | POA: Diagnosis not present

## 2023-04-08 DIAGNOSIS — M545 Low back pain, unspecified: Secondary | ICD-10-CM | POA: Diagnosis not present

## 2023-04-08 DIAGNOSIS — R059 Cough, unspecified: Secondary | ICD-10-CM | POA: Diagnosis not present

## 2023-04-08 DIAGNOSIS — Z5112 Encounter for antineoplastic immunotherapy: Secondary | ICD-10-CM | POA: Diagnosis not present

## 2023-04-08 DIAGNOSIS — Z87891 Personal history of nicotine dependence: Secondary | ICD-10-CM | POA: Diagnosis not present

## 2023-04-08 DIAGNOSIS — Z808 Family history of malignant neoplasm of other organs or systems: Secondary | ICD-10-CM | POA: Diagnosis not present

## 2023-04-08 LAB — CBC WITH DIFFERENTIAL (CANCER CENTER ONLY)
Abs Immature Granulocytes: 0.02 10*3/uL (ref 0.00–0.07)
Basophils Absolute: 0.1 10*3/uL (ref 0.0–0.1)
Basophils Relative: 1 %
Eosinophils Absolute: 0.2 10*3/uL (ref 0.0–0.5)
Eosinophils Relative: 4 %
HCT: 41.3 % (ref 39.0–52.0)
Hemoglobin: 14.4 g/dL (ref 13.0–17.0)
Immature Granulocytes: 1 %
Lymphocytes Relative: 27 %
Lymphs Abs: 1.1 10*3/uL (ref 0.7–4.0)
MCH: 29.4 pg (ref 26.0–34.0)
MCHC: 34.9 g/dL (ref 30.0–36.0)
MCV: 84.5 fL (ref 80.0–100.0)
Monocytes Absolute: 0.5 10*3/uL (ref 0.1–1.0)
Monocytes Relative: 12 %
Neutro Abs: 2.3 10*3/uL (ref 1.7–7.7)
Neutrophils Relative %: 55 %
Platelet Count: 186 10*3/uL (ref 150–400)
RBC: 4.89 MIL/uL (ref 4.22–5.81)
RDW: 15.3 % (ref 11.5–15.5)
WBC Count: 4.1 10*3/uL (ref 4.0–10.5)
nRBC: 0 % (ref 0.0–0.2)

## 2023-04-08 LAB — CMP (CANCER CENTER ONLY)
ALT: 20 U/L (ref 0–44)
AST: 19 U/L (ref 15–41)
Albumin: 4.2 g/dL (ref 3.5–5.0)
Alkaline Phosphatase: 42 U/L (ref 38–126)
Anion gap: 6 (ref 5–15)
BUN: 10 mg/dL (ref 8–23)
CO2: 24 mmol/L (ref 22–32)
Calcium: 9 mg/dL (ref 8.9–10.3)
Chloride: 105 mmol/L (ref 98–111)
Creatinine: 0.95 mg/dL (ref 0.61–1.24)
GFR, Estimated: >60 mL/min (ref >60)
Glucose, Bld: 86 mg/dL (ref 70–99)
Potassium: 4.2 mmol/L (ref 3.5–5.1)
Sodium: 135 mmol/L (ref 135–145)
Total Bilirubin: 0.8 mg/dL (ref 0.3–1.2)
Total Protein: 6.2 g/dL — ABNORMAL LOW (ref 6.5–8.1)

## 2023-04-08 MED ORDER — DEXTROSE 5 % IV SOLN
70.0000 mg/m2 | Freq: Once | INTRAVENOUS | Status: AC
  Administered 2023-04-08: 140 mg via INTRAVENOUS
  Filled 2023-04-08: qty 60

## 2023-04-08 MED ORDER — SODIUM CHLORIDE 0.9 % IV SOLN: Freq: Once | INTRAVENOUS | Status: AC

## 2023-04-08 MED ORDER — PALONOSETRON HCL INJECTION 0.25 MG/5ML
0.2500 mg | Freq: Once | INTRAVENOUS | Status: AC
  Administered 2023-04-08: 0.25 mg via INTRAVENOUS
  Filled 2023-04-08: qty 5

## 2023-04-08 MED ORDER — ZOLEDRONIC ACID 4 MG/100ML IV SOLN
4.0000 mg | Freq: Once | INTRAVENOUS | Status: AC
  Administered 2023-04-08: 4 mg via INTRAVENOUS
  Filled 2023-04-08: qty 100

## 2023-04-08 MED ORDER — DEXAMETHASONE SODIUM PHOSPHATE 10 MG/ML IJ SOLN
8.0000 mg | Freq: Once | INTRAMUSCULAR | Status: AC
  Administered 2023-04-08: 8 mg via INTRAVENOUS
  Filled 2023-04-08: qty 1

## 2023-04-08 NOTE — Progress Notes (Signed)
Patient seen by Dr. Kale  Vitals are within treatment parameters.  Labs reviewed: and are within treatment parameters.  Per physician team, patient is ready for treatment and there are NO modifications to the treatment plan.  

## 2023-04-08 NOTE — Patient Instructions (Signed)
[facility]  Discharge Instructions: Thank you for choosing [facility] to provide your oncology and hematology care.   If you have a lab appointment with the Cancer Center, please go directly to the Cancer Center and check in at the registration area.   Wear comfortable clothing and clothing appropriate for easy access to any Portacath or PICC line.   We strive to give you quality time with your provider. You may need to reschedule your appointment if you arrive late (15 or more minutes).  Arriving late affects you and other patients whose appointments are after yours.  Also, if you miss three or more appointments without notifying the office, you may be dismissed from the clinic at the provider's discretion.      For prescription refill requests, have your pharmacy contact our office and allow 72 hours for refills to be completed.    Today you received the following chemotherapy and/or immunotherapy agents :  Kyprolis   To help prevent nausea and vomiting after your treatment, we encourage you to take your nausea medication as directed.  BELOW ARE SYMPTOMS THAT SHOULD BE REPORTED IMMEDIATELY: *FEVER GREATER THAN 100.4 F (38 C) OR HIGHER *CHILLS OR SWEATING *NAUSEA AND VOMITING THAT IS NOT CONTROLLED WITH YOUR NAUSEA MEDICATION *UNUSUAL SHORTNESS OF BREATH *UNUSUAL BRUISING OR BLEEDING *URINARY PROBLEMS (pain or burning when urinating, or frequent urination) *BOWEL PROBLEMS (unusual diarrhea, constipation, pain near the anus) TENDERNESS IN MOUTH AND THROAT WITH OR WITHOUT PRESENCE OF ULCERS (sore throat, sores in mouth, or a toothache) UNUSUAL RASH, SWELLING OR PAIN  UNUSUAL VAGINAL DISCHARGE OR ITCHING   Items with * indicate a potential emergency and should be followed up as soon as possible or go to the Emergency Department if any problems should occur.  Please show the CHEMOTHERAPY ALERT CARD or IMMUNOTHERAPY ALERT CARD at  check-in to the Emergency Department and triage nurse.  Should you have questions after your visit or need to cancel or reschedule your appointment, please contact [facility]  Dept: [phone]  and follow the prompts.  Office hours are 8:00 a.m. to 4:30 p.m. Monday - Friday. Please note that voicemails left after 4:00 p.m. may not be returned until the following business day.  We are closed weekends and major holidays. You have access to a nurse at all times for urgent questions. Please call the main number to the clinic Dept: [phone] and follow the prompts.   For any non-urgent questions, you may also contact your provider using MyChart. We now offer e-Visits for anyone 18 and older to request care online for non-urgent symptoms. For details visit [email or web address].   Also download the MyChart app! Go to the app store, search "MyChart", open the app, select [place], and log in with your MyChart username and password.   

## 2023-04-08 NOTE — Progress Notes (Signed)
HEMATOLOGY/ONCOLOGY CLINIC NOTE  Date of Service: 04/08/23   Patient Care Team: Milus Height, [place] as PCP - General (Nurse Practitioner)  CHIEF COMPLAINTS/PURPOSE OF CONSULTATION:  Follow-up for continued evaluation and management of multiple myeloma  HISTORY OF PRESENTING ILLNESS:   Anthony [name] is a wonderful 62 y.o. male who has been referred to [place] by Dr Milus Height, PA for evaluation and management of newly diagnosed multiple myeloma. He reports He is doing well.  He reports persistent lower back pain that he has had since he was in his 20's. He notes no previous significant injuries. He further notes that he gets chiropractic adjustments and takes Tylenol to manage his symptoms. He notes less back pain when standing up on his right leg and maintaining weight on his right leg.  He reports previous history of smoking and chewing tobacco over 40 years ago.  He had a recent fall back in February of this year with only a pulled muscle. And he notes another fall when chasing his cat where he fell on his right shoulder. He reports pain in right shoulder.   He had a recent COVID-19 infection back in March.  He reports intermittent cough with SOB. He notes he takes 2 Rolaids at night. We discussed potentially trying antacids which he is agreeable to as he will begin steroids soon and was advised of the possible symptoms and side effects from taking steroids.  We discussed CRAB criteria and that he meets at least two of the criterion being  anemia and bone disease.  We discussed getting additional scans for further evaluation which he is agreeable to.  We further discussed starting Daratumumab/Velcade/Dexamethasone/Zometa and Revlimid for treatment which he is agreeable to. We also discussed starting steroids before treatment and taking an acid suppressant which he was also agreeable to.  We discussed getting Senna and taking it as needed for constipation.  Labs done today  were reviewed in detail.  We discussed his recent bone marrow biopsy and aspiration done 02/06/2022.  We discussed PET/CT scan done 01/28/2022.  We discussed CT right shoulder w/o contrast done 11/13/2021  INTERVAL HISTORY:  Anthony [name] is a 62 y.o. male, is here for continued evaluation of and management of multiple myeloma. Patient is here for cycle 13 day 15 of his treatment on 04/08/23.   Patient was last seen by PA Thayil on 03/12/2023 and he complained of increased fatigue and occasional nausea for 1-2 days after receiving treatment.   Patient notes he has been doing well overall since our last visit. He notes that his fatigue is improving since his last visit with PA Thayil. He has been taking vitamin B-12 supplement, which has been improving his fatigue.   He has been tolerating his treatment well with mild nausea. However, he notes his nausea has improved since our last visit.   He denies any recent infection issues, fever, chills, night sweats, abdominal pain, chest pain, abnormal bowel movement, or leg swelling. He does complain of chronic mild back pain, which has not changed.   Patient regularly takes vitamin-D supplement and Vitamin B-12 supplement.    MEDICAL HISTORY:  Past Medical History:  Diagnosis Date   Back pain    Cancer (HCC)    Multiple myeloma (HCC) 02/15/2022    SURGICAL HISTORY: Past Surgical History:  Procedure Laterality Date   IR BONE TUMOR(S)RF ABLATION  05/07/2022   IR BONE TUMOR(S)RF ABLATION  05/07/2022   IR BONE TUMOR(S)RF ABLATION  05/07/2022  IR KYPHO EA ADDL LEVEL THORACIC OR LUMBAR  05/07/2022   IR KYPHO LUMBAR INC FX REDUCE BONE BX UNI/BIL CANNULATION INC/IMAGING  05/07/2022   IR KYPHO THORACIC WITH BONE BIOPSY  05/07/2022   IR RADIOLOGIST EVAL & MGMT  04/23/2022   IR RADIOLOGIST EVAL & MGMT  05/17/2022   IR RADIOLOGIST EVAL & MGMT  06/06/2022    SOCIAL HISTORY: Social History   Socioeconomic History   Marital status: Married     Spouse name: Not on file   Number of children: Not on file   Years of education: Not on file   Highest education level: Not on file  Occupational History   Not on file  Tobacco Use   Smoking status: Never   Smokeless tobacco: Former    Types: Chew, Snuff  Vaping Use   Vaping status: Never Used  Substance and Sexual Activity   Alcohol use: Yes    Alcohol/week: 2.0 standard drinks of alcohol    Types: 2 Glasses of wine per week   Drug use: No   Sexual activity: Yes  Other Topics Concern   Not on file  Social History Narrative   Not on file   Social Determinants of Health   Financial Resource Strain: Low Risk  (05/07/2022)   Overall Financial Resource Strain (CARDIA)    Difficulty of Paying Living Expenses: Not very hard  Food Insecurity: No Food Insecurity (05/07/2022)   Hunger Vital Sign    Worried About Running Out of Food in the Last Year: Never true    Ran Out of Food in the Last Year: Never true  Transportation Needs: No Transportation Needs (05/07/2022)   PRAPARE - Administrator, Civil Service (Medical): No    Lack of Transportation (Non-Medical): No  Physical Activity: Not on file  Stress: Not on file  Social Connections: Not on file  Intimate Partner Violence: Unknown (05/07/2022)   Humiliation, Afraid, Rape, and Kick questionnaire    Fear of Current or Ex-Partner: No    Emotionally Abused: No    Physically Abused: No    Sexually Abused: Not on file    FAMILY HISTORY: Family History  Problem Relation Age of Onset   Rheum arthritis Mother    Other Sister        Pre-cancerous uterine mass;    Rheum arthritis Sister    Bone cancer Paternal Grandfather    Brain cancer Maternal Uncle    Brain cancer Maternal Aunt    Osteoporosis Neg Hx     ALLERGIES:  has No Known Allergies.  MEDICATIONS:  Current Outpatient Medications  Medication Sig Dispense Refill   acyclovir (ZOVIRAX) 400 MG tablet Take 1 tablet (400 mg total) by mouth 2 (two) times  daily. 60 tablet 11   aluminum chloride (DRYSOL) 20 % external solution Apply 1 application topically at bedtime.     calcium-vitamin D (OSCAL WITH D) 250-125 MG-UNIT tablet Take 1 tablet by mouth daily.     cyanocobalamin (VITAMIN B12) 1000 MCG tablet Take 1 tablet (1,000 mcg total) by mouth daily. 30 tablet 3   cyclobenzaprine (FLEXERIL) 10 MG tablet Take 10 mg by mouth 3 (three) times daily as needed for muscle spasms.     diclofenac sodium (VOLTAREN) 1 % GEL Apply 2 g topically as needed.     dronabinol (MARINOL) 10 MG capsule Take 1 capsule (10 mg total) by mouth 2 (two) times daily before a meal. 60 capsule 0   ELIQUIS 5 MG TABS  tablet Take 1 tablet (5 mg total) by mouth 2 (two) times daily. 60 tablet 5   ergocalciferol (VITAMIN D2) 1.25 MG (50000 UT) capsule Take 1 capsule (50,000 Units total) by mouth 2 (two) times a week. 12 capsule 2   esomeprazole (NEXIUM) 40 MG capsule TAKE ONE CAPSULE BY MOUTH DAILY BEFORE BREAKFAST 30 capsule 1   fentaNYL (DURAGESIC) 25 MCG/HR Place 1 patch onto the skin every 3 (three) days. 10 patch 0   furosemide (LASIX) 20 MG tablet Take 1 tablet (20 mg total) by mouth daily. 30 tablet 1   lidocaine (LIDODERM) 5 % Place 1 patch onto the skin daily. Remove & Discard patch within 12 hours or as directed by MD 30 patch 0   LORazepam (ATIVAN) 0.5 MG tablet Take 1 tablet (0.5 mg total) by mouth every 6 (six) hours as needed (Nausea or vomiting). 30 tablet 0   methocarbamol (ROBAXIN) 500 MG tablet Take 1 tablet (500 mg total) by mouth every 8 (eight) hours as needed for muscle spasms. 30 tablet 0   methylPREDNISolone (MEDROL DOSEPAK) 4 MG TBPK tablet Take 6 pills by mouth day 1, 5 on day 2, 4 on day 3, 3 on day 4, 2 on day 5, 1 on day 6 21 tablet 0   Multiple Vitamin (MULTIVITAMIN) tablet Take 1 tablet by mouth daily.     Omega-3 Fatty Acids (FISH OIL) 1360 MG CAPS Take 1 capsule by mouth daily.     ondansetron (ZOFRAN) 8 MG tablet Take 1 tablet (8 mg total) by mouth  every 8 (eight) hours as needed for nausea or vomiting. 30 tablet 0   oxyCODONE (ROXICODONE) 5 MG immediate release tablet Take 1 tablet (5 mg total) by mouth every 4 (four) hours as needed for severe pain or moderate pain. Take 1 to 2 tablets every 4 hours as needed for moderate to severe pain. 60 tablet 0   prochlorperazine (COMPAZINE) 10 MG tablet TAKE ONE TABLET BY MOUTH EVERY 6 HOURS AS NEEDED FOR NAUSEA AND VOMITING 30 tablet 1   REVLIMID 15 MG capsule TAKE 1 CAPSULE (15 mg) DAILY FOR 21 DAYS ON THEN 7 DAYS OFF 21 capsule 0   senna-docusate (SENNA S) 8.6-50 MG tablet Take 2 tablets by mouth at bedtime. 60 tablet 1   terbinafine (LAMISIL) 1 % cream Apply 1 application topically 2 (two) times daily.     No current facility-administered medications for this visit.   REVIEW OF SYSTEMS:  10 Point review of Systems was done is negative except as noted above.  PHYSICAL EXAMINATION: .BP (!) 144/96   Pulse 67   Temp 98.4 F (36.9 C)   Resp 20   Wt 197 lb 14.4 oz (89.8 kg)   SpO2 98%   BMI 31.00 kg/m   GENERAL:alert, in no acute distress and comfortable SKIN: no acute rashes, no significant lesions EYES: conjunctiva are pink and non-injected, sclera anicteric OROPHARYNX: MMM, no exudates, no oropharyngeal erythema or ulceration NECK: supple, no JVD LYMPH:  no palpable lymphadenopathy in the cervical, axillary or inguinal regions LUNGS: clear to auscultation b/l with normal respiratory effort HEART: regular rate & rhythm ABDOMEN:  normoactive bowel sounds , non tender, not distended. Extremity: no pedal edema PSYCH: alert & oriented x 3 with fluent speech NEURO: no focal motor/sensory deficits  LABORATORY DATA:  I have reviewed the data as listed .    Latest Ref Rng & Units 04/08/2023   10:37 AM 03/26/2023    8:09 AM 03/12/2023  12:58 PM  CBC  WBC 4.0 - 10.5 K/uL 4.1  3.3  4.1   Hemoglobin 13.0 - 17.0 g/dL 60.4  54.0  98.1   Hematocrit 39.0 - 52.0 % 41.3  42.8  44.1    Platelets 150 - 400 K/uL 186  133  188       Latest Ref Rng & Units 04/08/2023   10:37 AM 03/26/2023    8:09 AM 03/12/2023   12:58 PM  CMP  Glucose 70 - 99 mg/dL 86  191  478   BUN 8 - 23 mg/dL 10  13  11    Creatinine 0.61 - 1.24 mg/dL 2.95  6.21  3.08   Sodium 135 - 145 mmol/L 135  135  136   Potassium 3.5 - 5.1 mmol/L 4.2  3.9  3.8   Chloride 98 - 111 mmol/L 105  104  104   CO2 22 - 32 mmol/L 24  24  25    Calcium 8.9 - 10.3 mg/dL 9.0  8.6  9.3   Total Protein 6.5 - 8.1 g/dL 6.2  5.9  6.0   Total Bilirubin 0.3 - 1.2 mg/dL 0.8  0.9  0.6   Alkaline Phos 38 - 126 U/L 42  41  51   AST 15 - 41 U/L 19  12  13    ALT 0 - 44 U/L 20  11  12      PATHOLOGY Surgical Pathology CASE: [id number] PATIENT: Abdulloh Klunder Bone Marrow Report     Clinical History: Multiple Myeloma     DIAGNOSIS:  BONE MARROW, ASPIRATE, CLOT, CORE: -Hypercellular bone marrow (60%) with erythroid predominant trilineage hematopoiesis and no evidence of residual plasma cell neoplasm (less than 1% plasma cells by manual aspirate differential, and CD138 immunohistochemical analysis of clot and core sections).  PERIPHERAL BLOOD: -Leukopenia  MICROSCOPIC DESCRIPTION:  PERIPHERAL BLOOD SMEAR: Platelets: Adequate in number, no platelet clumps identified Erythroid: Normocytic normochromic red blood cells Leukocytes: Mild leukopenia, negative for dysplastic granulocytes, blasts or  plasma cells  BONE MARROW ASPIRATE: Cellular Erythroid precursors: Relatively increased, show a full sequence of maturation with occasional abnormal forms (including binucleate forms, megaloblastoid change and nuclear membrane irregularities) Granulocytic precursors: Decreased, show a full sequence of generally orderly maturation Megakaryocytes: Normal in number and morphology Lymphocytes/plasma cells: Not increased   02/06/2022 Molecular pathology   RADIOGRAPHIC STUDIES: I have personally reviewed the radiological  images as listed and agreed with the findings in the report. No results found.   ASSESSMENT & PLAN:   62 y.o. very pleasant male with  1. R-ISS stage III multiple myeloma with extensive bone metastases and patholgiic fracture rt shoulder -Recent bone marrow biopsy and aspiration done 02/06/2022 revealed hypercellular bone marrow with plasma cell neoplasm. Cytogenetics-normal karyotype Molecular cytogenetics-TP53 deletion and duplication of 1 q.Marland Kitchen  PLAN: -Discussed lab results from today, 04/08/2023, with the patient. CBC and CMP are stable. -Discussed lab results from 03/26/2023 with the patient. Ferritin level in the normal range at 38. Low Vitamin B-12 at 187. Vitamin-D level at 126.35. Iron saturation at 29%. -Recommend to cut down Vitamin-D supplement once a week.  -Continue vitamin B-12 supplement. 1,000 mcg daily.  -Recommend to start B-Complex.  -Add lipid panel to next lab.   -The most recent Myeloma panel shows that patient is in remission -continue maintenance Revlimid treatment -no clinical sign or evidence of multiple myeloma progression at this time -Answered all of patient's questions.  FOLLOW-UP: Per integrated scheduling plz schedule next 4 treatments MD visit in  4 weeks  The total time spent in the appointment was 30 minutes* .  All of the patient's questions were answered with apparent satisfaction. The patient knows to call the clinic with any problems, questions or concerns.   Wyvonnia Lora MD MS AAHIVMS [facility] Hematology/Oncology Physician [facility]  .*Total Encounter Time as defined by the Centers for Medicare and Medicaid Services includes, in addition to the face-to-face time of a patient visit (documented in the note above) non-face-to-face time: obtaining and reviewing outside history, ordering and reviewing medications, tests or procedures, care coordination (communications with other health care professionals or caregivers) and  documentation in the medical record.   I,Param Shah,acting as a Neurosurgeon for Wyvonnia Lora, MD.,have documented all relevant documentation on the behalf of Wyvonnia Lora, MD,as directed by  Wyvonnia Lora, MD while in the presence of Wyvonnia Lora, MD.  .I have reviewed the above documentation for accuracy and completeness, and I agree with the above. Johney Maine MD'

## 2023-04-10 ENCOUNTER — Other Ambulatory Visit: Payer: Self-pay

## 2023-04-12 ENCOUNTER — Other Ambulatory Visit: Payer: Self-pay | Admitting: Hematology

## 2023-04-14 ENCOUNTER — Encounter: Payer: Self-pay | Admitting: Hematology

## 2023-04-15 ENCOUNTER — Other Ambulatory Visit: Payer: Self-pay

## 2023-04-15 DIAGNOSIS — C9000 Multiple myeloma not having achieved remission: Secondary | ICD-10-CM

## 2023-04-15 DIAGNOSIS — H903 Sensorineural hearing loss, bilateral: Secondary | ICD-10-CM | POA: Diagnosis not present

## 2023-04-17 ENCOUNTER — Other Ambulatory Visit: Payer: Self-pay | Admitting: Hematology

## 2023-04-17 ENCOUNTER — Encounter: Payer: Self-pay | Admitting: Hematology

## 2023-04-17 ENCOUNTER — Other Ambulatory Visit: Payer: Self-pay

## 2023-04-17 DIAGNOSIS — C9000 Multiple myeloma not having achieved remission: Secondary | ICD-10-CM

## 2023-04-17 MED ORDER — OXYCODONE HCL 5 MG PO TABS
5.0000 mg | ORAL_TABLET | ORAL | 0 refills | Status: DC | PRN
Start: 2023-04-17 — End: 2023-05-01

## 2023-04-19 ENCOUNTER — Encounter: Payer: Self-pay | Admitting: Hematology

## 2023-04-19 MED ORDER — ERGOCALCIFEROL 1.25 MG (50000 UT) PO CAPS: 50000.0000 [IU] | ORAL_CAPSULE | ORAL | 2 refills | Status: DC

## 2023-04-19 MED ORDER — FENTANYL 25 MCG/HR TD PT72
1.0000 | MEDICATED_PATCH | TRANSDERMAL | 0 refills | Status: AC
Start: 2023-04-19 — End: ?

## 2023-04-22 ENCOUNTER — Other Ambulatory Visit: Payer: Self-pay

## 2023-04-22 DIAGNOSIS — C9000 Multiple myeloma not having achieved remission: Secondary | ICD-10-CM

## 2023-04-22 MED ORDER — ESOMEPRAZOLE MAGNESIUM 40 MG PO CPDR
40.0000 mg | DELAYED_RELEASE_CAPSULE | Freq: Every day | ORAL | 1 refills | Status: DC
Start: 2023-04-22 — End: 2023-06-23

## 2023-04-23 ENCOUNTER — Inpatient Hospital Stay: Payer: BC Managed Care – PPO | Attending: Physician Assistant

## 2023-04-23 ENCOUNTER — Inpatient Hospital Stay: Payer: BC Managed Care – PPO

## 2023-04-23 ENCOUNTER — Inpatient Hospital Stay: Payer: BC Managed Care – PPO | Admitting: Dietician

## 2023-04-23 VITALS — BP 130/82 | HR 63 | Temp 98.2°F | Resp 16 | Wt 199.0 lb

## 2023-04-23 DIAGNOSIS — Z809 Family history of malignant neoplasm, unspecified: Secondary | ICD-10-CM | POA: Diagnosis not present

## 2023-04-23 DIAGNOSIS — R5383 Other fatigue: Secondary | ICD-10-CM | POA: Diagnosis not present

## 2023-04-23 DIAGNOSIS — Z87891 Personal history of nicotine dependence: Secondary | ICD-10-CM | POA: Diagnosis not present

## 2023-04-23 DIAGNOSIS — C9000 Multiple myeloma not having achieved remission: Secondary | ICD-10-CM | POA: Diagnosis not present

## 2023-04-23 DIAGNOSIS — G8929 Other chronic pain: Secondary | ICD-10-CM | POA: Insufficient documentation

## 2023-04-23 DIAGNOSIS — M549 Dorsalgia, unspecified: Secondary | ICD-10-CM | POA: Insufficient documentation

## 2023-04-23 DIAGNOSIS — Z7189 Other specified counseling: Secondary | ICD-10-CM

## 2023-04-23 DIAGNOSIS — Z79899 Other long term (current) drug therapy: Secondary | ICD-10-CM | POA: Diagnosis not present

## 2023-04-23 DIAGNOSIS — Z5112 Encounter for antineoplastic immunotherapy: Secondary | ICD-10-CM | POA: Diagnosis not present

## 2023-04-23 DIAGNOSIS — Z808 Family history of malignant neoplasm of other organs or systems: Secondary | ICD-10-CM | POA: Diagnosis not present

## 2023-04-23 LAB — CBC WITH DIFFERENTIAL (CANCER CENTER ONLY)
Abs Immature Granulocytes: 0.03 10*3/uL (ref 0.00–0.07)
Basophils Absolute: 0 10*3/uL (ref 0.0–0.1)
Basophils Relative: 1 %
Eosinophils Absolute: 0.2 10*3/uL (ref 0.0–0.5)
Eosinophils Relative: 6 %
HCT: 43.6 % (ref 39.0–52.0)
Hemoglobin: 14.5 g/dL (ref 13.0–17.0)
Immature Granulocytes: 1 %
Lymphocytes Relative: 28 %
Lymphs Abs: 1 10*3/uL (ref 0.7–4.0)
MCH: 28.7 pg (ref 26.0–34.0)
MCHC: 33.3 g/dL (ref 30.0–36.0)
MCV: 86.3 fL (ref 80.0–100.0)
Monocytes Absolute: 0.5 10*3/uL (ref 0.1–1.0)
Monocytes Relative: 14 %
Neutro Abs: 1.8 10*3/uL (ref 1.7–7.7)
Neutrophils Relative %: 50 %
Platelet Count: 137 10*3/uL — ABNORMAL LOW (ref 150–400)
RBC: 5.05 MIL/uL (ref 4.22–5.81)
RDW: 14.7 % (ref 11.5–15.5)
WBC Count: 3.6 10*3/uL — ABNORMAL LOW (ref 4.0–10.5)
nRBC: 0 % (ref 0.0–0.2)

## 2023-04-23 LAB — CMP (CANCER CENTER ONLY)
ALT: 14 U/L (ref 0–44)
AST: 13 U/L — ABNORMAL LOW (ref 15–41)
Albumin: 4.2 g/dL (ref 3.5–5.0)
Alkaline Phosphatase: 43 U/L (ref 38–126)
Anion gap: 5 (ref 5–15)
BUN: 13 mg/dL (ref 8–23)
CO2: 27 mmol/L (ref 22–32)
Calcium: 8.8 mg/dL — ABNORMAL LOW (ref 8.9–10.3)
Chloride: 106 mmol/L (ref 98–111)
Creatinine: 0.83 mg/dL (ref 0.61–1.24)
GFR, Estimated: >60 mL/min (ref >60)
Glucose, Bld: 96 mg/dL (ref 70–99)
Potassium: 4 mmol/L (ref 3.5–5.1)
Sodium: 138 mmol/L (ref 135–145)
Total Bilirubin: 1 mg/dL (ref 0.3–1.2)
Total Protein: 6.1 g/dL — ABNORMAL LOW (ref 6.5–8.1)

## 2023-04-23 LAB — LIPID PANEL
Cholesterol: 166 mg/dL (ref 0–200)
HDL: 47 mg/dL (ref >40)
LDL Cholesterol: 102 mg/dL — ABNORMAL HIGH (ref 0–99)
Total CHOL/HDL Ratio: 3.5 ratio
Triglycerides: 86 mg/dL (ref <150)
VLDL: 17 mg/dL (ref 0–40)

## 2023-04-23 MED ORDER — SODIUM CHLORIDE 0.9 % IV SOLN: Freq: Once | INTRAVENOUS | Status: AC

## 2023-04-23 MED ORDER — ACETAMINOPHEN 325 MG PO TABS
650.0000 mg | ORAL_TABLET | Freq: Once | ORAL | Status: AC
  Administered 2023-04-23: 650 mg via ORAL
  Filled 2023-04-23: qty 2

## 2023-04-23 MED ORDER — PALONOSETRON HCL INJECTION 0.25 MG/5ML
0.2500 mg | Freq: Once | INTRAVENOUS | Status: AC
  Administered 2023-04-23: 0.25 mg via INTRAVENOUS
  Filled 2023-04-23: qty 5

## 2023-04-23 MED ORDER — DEXAMETHASONE SODIUM PHOSPHATE 10 MG/ML IJ SOLN
8.0000 mg | Freq: Once | INTRAMUSCULAR | Status: AC
  Administered 2023-04-23: 8 mg via INTRAVENOUS
  Filled 2023-04-23: qty 1

## 2023-04-23 MED ORDER — DEXTROSE 5 % IV SOLN
70.0000 mg/m2 | Freq: Once | INTRAVENOUS | Status: AC
  Administered 2023-04-23: 140 mg via INTRAVENOUS
  Filled 2023-04-23: qty 60

## 2023-04-23 NOTE — Progress Notes (Signed)
Nutrition Follow-up:  Patient with multiple myeloma. He is receiving kyprolis + darzalex q28d (start 04/07/22)    Met with pt in infusion. He reports appetite has been okay overall. His nausea has improved in the last couple of months. His wt has increased today which he did not expect. Yesterday pt recalls 2 cups coffee (half/half), cinnamon roll, banana toast, large can vegetable soup with 2 pieces of toast, 2 ham/cheese sandwiches, yogurt, protein shake. He drank 4 bottles of water before midnight as he fasted for cholesterol panel this morning. Pt denies BLE swelling. Pt reports lower back pain managed well with pain medication. He is having regular bowel movements, usually 2/day.    Medications: reviewed  Labs: reviewed   Anthropometrics: Wt 199 lb today - trending up  8/20 - 197 lb 14.4 oz 8/7 - 194 lb  7/24 - 193 lb 3.2 oz    NUTRITION DIAGNOSIS: Unintended wt loss - stable   INTERVENTION:  Reviewed dietary recall with pt and discussed high sodium intake, water retention, increasing wt Continue small frequent meals/snacks, encouraged lean proteins  Continue protein shakes - as needed with poor appetite    MONITORING, EVALUATION, GOAL: wt trends, intake   NEXT VISIT: Tuesday October 29 during infusion

## 2023-04-23 NOTE — Patient Instructions (Signed)
[facility]  Discharge Instructions: Thank you for choosing [facility] to provide your oncology and hematology care.   If you have a lab appointment with the Cancer Center, please go directly to the Cancer Center and check in at the registration area.   Wear comfortable clothing and clothing appropriate for easy access to any Portacath or PICC line.   We strive to give you quality time with your provider. You may need to reschedule your appointment if you arrive late (15 or more minutes).  Arriving late affects you and other patients whose appointments are after yours.  Also, if you miss three or more appointments without notifying the office, you may be dismissed from the clinic at the provider's discretion.      For prescription refill requests, have your pharmacy contact our office and allow 72 hours for refills to be completed.    Today you received the following chemotherapy and/or immunotherapy agents :  Kyprolis   To help prevent nausea and vomiting after your treatment, we encourage you to take your nausea medication as directed.  BELOW ARE SYMPTOMS THAT SHOULD BE REPORTED IMMEDIATELY: *FEVER GREATER THAN 100.4 F (38 C) OR HIGHER *CHILLS OR SWEATING *NAUSEA AND VOMITING THAT IS NOT CONTROLLED WITH YOUR NAUSEA MEDICATION *UNUSUAL SHORTNESS OF BREATH *UNUSUAL BRUISING OR BLEEDING *URINARY PROBLEMS (pain or burning when urinating, or frequent urination) *BOWEL PROBLEMS (unusual diarrhea, constipation, pain near the anus) TENDERNESS IN MOUTH AND THROAT WITH OR WITHOUT PRESENCE OF ULCERS (sore throat, sores in mouth, or a toothache) UNUSUAL RASH, SWELLING OR PAIN  UNUSUAL VAGINAL DISCHARGE OR ITCHING   Items with * indicate a potential emergency and should be followed up as soon as possible or go to the Emergency Department if any problems should occur.  Please show the CHEMOTHERAPY ALERT CARD or IMMUNOTHERAPY ALERT CARD at  check-in to the Emergency Department and triage nurse.  Should you have questions after your visit or need to cancel or reschedule your appointment, please contact [facility]  Dept: [phone]  and follow the prompts.  Office hours are 8:00 a.m. to 4:30 p.m. Monday - Friday. Please note that voicemails left after 4:00 p.m. may not be returned until the following business day.  We are closed weekends and major holidays. You have access to a nurse at all times for urgent questions. Please call the main number to the clinic Dept: [phone] and follow the prompts.   For any non-urgent questions, you may also contact your provider using MyChart. We now offer e-Visits for anyone 18 and older to request care online for non-urgent symptoms. For details visit [email or web address].   Also download the MyChart app! Go to the app store, search "MyChart", open the app, select [place], and log in with your MyChart username and password.   

## 2023-04-27 LAB — MULTIPLE MYELOMA PANEL, SERUM
Albumin SerPl Elph-Mcnc: 3.5 g/dL (ref 2.9–4.4)
Albumin/Glob SerPl: 1.7 (ref 0.7–1.7)
Alpha 1: 0.2 g/dL (ref 0.0–0.4)
Alpha2 Glob SerPl Elph-Mcnc: 0.5 g/dL (ref 0.4–1.0)
B-Globulin SerPl Elph-Mcnc: 0.8 g/dL (ref 0.7–1.3)
Gamma Glob SerPl Elph-Mcnc: 0.5 g/dL (ref 0.4–1.8)
Globulin, Total: 2.1 g/dL — ABNORMAL LOW (ref 2.2–3.9)
IgA: 23 mg/dL — ABNORMAL LOW (ref 61–437)
IgG (Immunoglobin G), Serum: 544 mg/dL — ABNORMAL LOW (ref 603–1613)
IgM (Immunoglobulin M), Srm: 12 mg/dL — ABNORMAL LOW (ref 20–172)
Total Protein ELP: 5.6 g/dL — ABNORMAL LOW (ref 6.0–8.5)

## 2023-04-28 ENCOUNTER — Telehealth: Payer: Self-pay

## 2023-04-28 ENCOUNTER — Other Ambulatory Visit: Payer: Self-pay

## 2023-04-28 NOTE — Telephone Encounter (Signed)
T/C from pt stating at his last appointment symptoms for sleep apnea was mentioned. He feels like he has the signs and symptoms and would like to be referred for testing.  Please advise

## 2023-04-29 NOTE — Telephone Encounter (Signed)
LM for pt Per Dr Candise Che he does agree to pt having  Sleep Apnea evaluation and pt will need to contact his PCP to have this arranged

## 2023-05-01 ENCOUNTER — Other Ambulatory Visit: Payer: Self-pay

## 2023-05-01 DIAGNOSIS — C9000 Multiple myeloma not having achieved remission: Secondary | ICD-10-CM

## 2023-05-02 ENCOUNTER — Other Ambulatory Visit: Payer: Self-pay

## 2023-05-02 ENCOUNTER — Encounter: Payer: Self-pay | Admitting: Hematology

## 2023-05-02 DIAGNOSIS — C9000 Multiple myeloma not having achieved remission: Secondary | ICD-10-CM

## 2023-05-02 MED ORDER — OXYCODONE HCL 5 MG PO TABS: 5.0000 mg | ORAL_TABLET | ORAL | 0 refills | Status: DC | PRN

## 2023-05-02 MED ORDER — METHOCARBAMOL 500 MG PO TABS: 500.0000 mg | ORAL_TABLET | Freq: Three times a day (TID) | ORAL | 0 refills | Status: DC | PRN

## 2023-05-06 DIAGNOSIS — R0683 Snoring: Secondary | ICD-10-CM | POA: Diagnosis not present

## 2023-05-06 DIAGNOSIS — G4719 Other hypersomnia: Secondary | ICD-10-CM | POA: Diagnosis not present

## 2023-05-06 DIAGNOSIS — R5383 Other fatigue: Secondary | ICD-10-CM | POA: Diagnosis not present

## 2023-05-07 ENCOUNTER — Inpatient Hospital Stay: Payer: BC Managed Care – PPO

## 2023-05-07 ENCOUNTER — Inpatient Hospital Stay (HOSPITAL_BASED_OUTPATIENT_CLINIC_OR_DEPARTMENT_OTHER): Payer: BC Managed Care – PPO | Admitting: Physician Assistant

## 2023-05-07 VITALS — BP 148/91 | HR 63 | Temp 97.9°F | Resp 20 | Wt 196.3 lb

## 2023-05-07 VITALS — BP 134/83 | HR 71 | Resp 19

## 2023-05-07 DIAGNOSIS — Z808 Family history of malignant neoplasm of other organs or systems: Secondary | ICD-10-CM | POA: Diagnosis not present

## 2023-05-07 DIAGNOSIS — M549 Dorsalgia, unspecified: Secondary | ICD-10-CM | POA: Diagnosis not present

## 2023-05-07 DIAGNOSIS — R5383 Other fatigue: Secondary | ICD-10-CM | POA: Diagnosis not present

## 2023-05-07 DIAGNOSIS — Z7189 Other specified counseling: Secondary | ICD-10-CM | POA: Diagnosis not present

## 2023-05-07 DIAGNOSIS — C9000 Multiple myeloma not having achieved remission: Secondary | ICD-10-CM

## 2023-05-07 DIAGNOSIS — Z87891 Personal history of nicotine dependence: Secondary | ICD-10-CM | POA: Diagnosis not present

## 2023-05-07 DIAGNOSIS — Z809 Family history of malignant neoplasm, unspecified: Secondary | ICD-10-CM | POA: Diagnosis not present

## 2023-05-07 DIAGNOSIS — Z79899 Other long term (current) drug therapy: Secondary | ICD-10-CM | POA: Diagnosis not present

## 2023-05-07 DIAGNOSIS — Z5112 Encounter for antineoplastic immunotherapy: Secondary | ICD-10-CM | POA: Diagnosis not present

## 2023-05-07 DIAGNOSIS — G8929 Other chronic pain: Secondary | ICD-10-CM | POA: Diagnosis not present

## 2023-05-07 LAB — CMP (CANCER CENTER ONLY)
ALT: 14 U/L (ref 0–44)
AST: 13 U/L — ABNORMAL LOW (ref 15–41)
Albumin: 4.3 g/dL (ref 3.5–5.0)
Alkaline Phosphatase: 48 U/L (ref 38–126)
Anion gap: 6 (ref 5–15)
BUN: 14 mg/dL (ref 8–23)
CO2: 24 mmol/L (ref 22–32)
Calcium: 9.4 mg/dL (ref 8.9–10.3)
Chloride: 105 mmol/L (ref 98–111)
Creatinine: 0.94 mg/dL (ref 0.61–1.24)
GFR, Estimated: >60 mL/min (ref >60)
Glucose, Bld: 90 mg/dL (ref 70–99)
Potassium: 4.1 mmol/L (ref 3.5–5.1)
Sodium: 135 mmol/L (ref 135–145)
Total Bilirubin: 1 mg/dL (ref 0.3–1.2)
Total Protein: 6.5 g/dL (ref 6.5–8.1)

## 2023-05-07 LAB — CBC WITH DIFFERENTIAL (CANCER CENTER ONLY)
Abs Immature Granulocytes: 0.02 10*3/uL (ref 0.00–0.07)
Basophils Absolute: 0 10*3/uL (ref 0.0–0.1)
Basophils Relative: 1 %
Eosinophils Absolute: 0.2 10*3/uL (ref 0.0–0.5)
Eosinophils Relative: 5 %
HCT: 43 % (ref 39.0–52.0)
Hemoglobin: 15 g/dL (ref 13.0–17.0)
Immature Granulocytes: 1 %
Lymphocytes Relative: 31 %
Lymphs Abs: 1.2 10*3/uL (ref 0.7–4.0)
MCH: 29.9 pg (ref 26.0–34.0)
MCHC: 34.9 g/dL (ref 30.0–36.0)
MCV: 85.7 fL (ref 80.0–100.0)
Monocytes Absolute: 0.4 10*3/uL (ref 0.1–1.0)
Monocytes Relative: 9 %
Neutro Abs: 2.1 10*3/uL (ref 1.7–7.7)
Neutrophils Relative %: 53 %
Platelet Count: 189 10*3/uL (ref 150–400)
RBC: 5.02 MIL/uL (ref 4.22–5.81)
RDW: 14.5 % (ref 11.5–15.5)
WBC Count: 4 10*3/uL (ref 4.0–10.5)
nRBC: 0 % (ref 0.0–0.2)

## 2023-05-07 MED ORDER — SODIUM CHLORIDE 0.9 % IV SOLN: Freq: Once | INTRAVENOUS | Status: AC

## 2023-05-07 MED ORDER — DEXTROSE 5 % IV SOLN
70.0000 mg/m2 | Freq: Once | INTRAVENOUS | Status: AC
  Administered 2023-05-07: 140 mg via INTRAVENOUS
  Filled 2023-05-07: qty 60

## 2023-05-07 MED ORDER — ZOLEDRONIC ACID 4 MG/100ML IV SOLN
4.0000 mg | Freq: Once | INTRAVENOUS | Status: AC
  Administered 2023-05-07: 4 mg via INTRAVENOUS
  Filled 2023-05-07: qty 100

## 2023-05-07 MED ORDER — PALONOSETRON HCL INJECTION 0.25 MG/5ML
0.2500 mg | Freq: Once | INTRAVENOUS | Status: AC
  Administered 2023-05-07: 0.25 mg via INTRAVENOUS
  Filled 2023-05-07: qty 5

## 2023-05-07 MED ORDER — DEXAMETHASONE SODIUM PHOSPHATE 10 MG/ML IJ SOLN
8.0000 mg | Freq: Once | INTRAMUSCULAR | Status: AC
  Administered 2023-05-07: 8 mg via INTRAVENOUS
  Filled 2023-05-07: qty 1

## 2023-05-07 NOTE — Progress Notes (Signed)
HEMATOLOGY/ONCOLOGY CLINIC NOTE  Date of Service: .05/07/2023   Patient Care Team: Milus Height, PA as PCP - General (Nurse Practitioner)  CHIEF COMPLAINTS/PURPOSE OF CONSULTATION:  Multiple myeloma  INTERVAL HISTORY:  Anthony [name] is a 62 y.o. male who presents for a follow up visit for continued management of multiple myeloma. He was last seen by Dr. Candise Che on 04/08/2023. He presents today before Cycle 14, Day 15 of Carfilzomib/Revimid/Dexamethasone treatment.  Mr. Tubb reports he is feeling well especially in the last few days. He does have chronic fatigue which has improved some since starting vitamin B12 supplementation. His appetite is stable without any changes to his weight. His nausea is well controlled with antiemetics. He denies vomiting episodes. His bowel habits are unchanged without recurrent episodes of diarrhea or constipation. He denies easy bruising or signs of bleeding. He back pain is chronic in nature without any new bone/back pain. He denies fevers, chill, night sweats, shortness of breath, chest pain or cough. He has no other complaints.   MEDICAL HISTORY:  Past Medical History:  Diagnosis Date   Back pain    Cancer (HCC)    Multiple myeloma (HCC) 02/15/2022    SURGICAL HISTORY: Past Surgical History:  Procedure Laterality Date   IR BONE TUMOR(S)RF ABLATION  05/07/2022   IR BONE TUMOR(S)RF ABLATION  05/07/2022   IR BONE TUMOR(S)RF ABLATION  05/07/2022   IR KYPHO EA ADDL LEVEL THORACIC OR LUMBAR  05/07/2022   IR KYPHO LUMBAR INC FX REDUCE BONE BX UNI/BIL CANNULATION INC/IMAGING  05/07/2022   IR KYPHO THORACIC WITH BONE BIOPSY  05/07/2022   IR RADIOLOGIST EVAL & MGMT  04/23/2022   IR RADIOLOGIST EVAL & MGMT  05/17/2022   IR RADIOLOGIST EVAL & MGMT  06/06/2022    SOCIAL HISTORY: Social History   Socioeconomic History   Marital status: Married    Spouse name: Not on file   Number of children: Not on file   Years of education: Not on file   Highest  education level: Not on file  Occupational History   Not on file  Tobacco Use   Smoking status: Never   Smokeless tobacco: Former    Types: Chew, Snuff  Vaping Use   Vaping status: Never Used  Substance and Sexual Activity   Alcohol use: Yes    Alcohol/week: 2.0 standard drinks of alcohol    Types: 2 Glasses of wine per week   Drug use: No   Sexual activity: Yes  Other Topics Concern   Not on file  Social History Narrative   Not on file   Social Determinants of Health   Financial Resource Strain: Low Risk  (05/07/2022)   Overall Financial Resource Strain (CARDIA)    Difficulty of Paying Living Expenses: Not very hard  Food Insecurity: No Food Insecurity (05/07/2022)   Hunger Vital Sign    Worried About Running Out of Food in the Last Year: Never true    Ran Out of Food in the Last Year: Never true  Transportation Needs: No Transportation Needs (05/07/2022)   PRAPARE - Administrator, Civil Service (Medical): No    Lack of Transportation (Non-Medical): No  Physical Activity: Not on file  Stress: Not on file  Social Connections: Not on file  Intimate Partner Violence: Unknown (05/07/2022)   Humiliation, Afraid, Rape, and Kick questionnaire    Fear of Current or Ex-Partner: No    Emotionally Abused: No    Physically Abused: No  Sexually Abused: Not on file    FAMILY HISTORY: Family History  Problem Relation Age of Onset   Rheum arthritis Mother    Other Sister        Pre-cancerous uterine mass;    Rheum arthritis Sister    Bone cancer Paternal Grandfather    Brain cancer Maternal Uncle    Brain cancer Maternal Aunt    Osteoporosis Neg Hx     ALLERGIES:  has No Known Allergies.  MEDICATIONS:  Current Outpatient Medications  Medication Sig Dispense Refill   acyclovir (ZOVIRAX) 400 MG tablet Take 1 tablet (400 mg total) by mouth 2 (two) times daily. 60 tablet 11   aluminum chloride (DRYSOL) 20 % external solution Apply 1 application topically at  bedtime.     calcium-vitamin D (OSCAL WITH D) 250-125 MG-UNIT tablet Take 1 tablet by mouth daily.     cyanocobalamin (VITAMIN B12) 1000 MCG tablet Take 1 tablet (1,000 mcg total) by mouth daily. 30 tablet 3   cyclobenzaprine (FLEXERIL) 10 MG tablet Take 10 mg by mouth 3 (three) times daily as needed for muscle spasms.     diclofenac sodium (VOLTAREN) 1 % GEL Apply 2 g topically as needed.     dronabinol (MARINOL) 10 MG capsule Take 1 capsule (10 mg total) by mouth 2 (two) times daily before a meal. 60 capsule 0   ELIQUIS 5 MG TABS tablet Take 1 tablet (5 mg total) by mouth 2 (two) times daily. 60 tablet 5   ergocalciferol (VITAMIN D2) 1.25 MG (50000 UT) capsule Take 1 capsule (50,000 Units total) by mouth 2 (two) times a week. 12 capsule 2   esomeprazole (NEXIUM) 40 MG capsule Take 1 capsule (40 mg total) by mouth daily. Take 1 capsule (40 mg total) daily before breakfast 30 capsule 1   fentaNYL (DURAGESIC) 25 MCG/HR Place 1 patch onto the skin every 3 (three) days. 10 patch 0   furosemide (LASIX) 20 MG tablet Take 1 tablet (20 mg total) by mouth daily. 30 tablet 1   lidocaine (LIDODERM) 5 % Place 1 patch onto the skin daily. Remove & Discard patch within 12 hours or as directed by MD 30 patch 0   LORazepam (ATIVAN) 0.5 MG tablet Take 1 tablet (0.5 mg total) by mouth every 6 (six) hours as needed (Nausea or vomiting). 30 tablet 0   methocarbamol (ROBAXIN) 500 MG tablet Take 1 tablet (500 mg total) by mouth every 8 (eight) hours as needed for muscle spasms. 30 tablet 0   methylPREDNISolone (MEDROL DOSEPAK) 4 MG TBPK tablet Take 6 pills by mouth day 1, 5 on day 2, 4 on day 3, 3 on day 4, 2 on day 5, 1 on day 6 21 tablet 0   Multiple Vitamin (MULTIVITAMIN) tablet Take 1 tablet by mouth daily.     Omega-3 Fatty Acids (FISH OIL) 1360 MG CAPS Take 1 capsule by mouth daily.     ondansetron (ZOFRAN) 8 MG tablet Take 1 tablet (8 mg total) by mouth every 8 (eight) hours as needed for nausea or vomiting. 30  tablet 0   oxyCODONE (ROXICODONE) 5 MG immediate release tablet Take 1 tablet (5 mg total) by mouth every 4 (four) hours as needed for severe pain or moderate pain. Take 1 to 2 tablets every 4 hours as needed for moderate to severe pain. 60 tablet 0   prochlorperazine (COMPAZINE) 10 MG tablet TAKE ONE TABLET BY MOUTH EVERY 6 HOURS AS NEEDED FOR NAUSEA AND VOMITING 30 tablet  1   REVLIMID 15 MG capsule TAKE 1 CAPSULE DAILY FOR 21 DAYS ON, THEN 7 DAYS OFF 21 capsule 0   senna-docusate (SENNA S) 8.6-50 MG tablet Take 2 tablets by mouth at bedtime. 60 tablet 1   terbinafine (LAMISIL) 1 % cream Apply 1 application topically 2 (two) times daily.     No current facility-administered medications for this visit.    10 Point review of Systems was done is negative except as noted above.  PHYSICAL EXAMINATION: Vitals:   05/07/23 1110  BP: (!) 148/91  Pulse: 63  Resp: 20  Temp: 97.9 F (36.6 C)  SpO2: 98%   NAD GENERAL:alert, in no acute distress and comfortable SKIN: no acute rashes, no significant lesions EYES: conjunctiva are pink and non-injected, sclera anicteric NECK: supple, no JVD LUNGS: clear to auscultation b/l with normal respiratory effort HEART: regular rate & rhythm Extremity: no pedal edema PSYCH: alert & oriented x 3 with fluent speech NEURO: no focal motor/sensory deficits    LABORATORY DATA:  I have reviewed the data as listed .    Latest Ref Rng & Units 05/07/2023   10:35 AM 04/23/2023    8:39 AM 04/08/2023   10:37 AM  CBC  WBC 4.0 - 10.5 K/uL 4.0  3.6  4.1   Hemoglobin 13.0 - 17.0 g/dL 16.1  09.6  04.5   Hematocrit 39.0 - 52.0 % 43.0  43.6  41.3   Platelets 150 - 400 K/uL 189  137  186       Latest Ref Rng & Units 05/07/2023   10:35 AM 04/23/2023    8:39 AM 04/08/2023   10:37 AM  CMP  Glucose 70 - 99 mg/dL 90  96  86   BUN 8 - 23 mg/dL 14  13  10    Creatinine 0.61 - 1.24 mg/dL 4.09  8.11  9.14   Sodium 135 - 145 mmol/L 135  138  135   Potassium 3.5 - 5.1  mmol/L 4.1  4.0  4.2   Chloride 98 - 111 mmol/L 105  106  105   CO2 22 - 32 mmol/L 24  27  24    Calcium 8.9 - 10.3 mg/dL 9.4  8.8  9.0   Total Protein 6.5 - 8.1 g/dL 6.5  6.1  6.2   Total Bilirubin 0.3 - 1.2 mg/dL 1.0  1.0  0.8   Alkaline Phos 38 - 126 U/L 48  43  42   AST 15 - 41 U/L 13  13  19    ALT 0 - 44 U/L 14  14  20      02/06/2022 Molecular pathology   RADIOGRAPHIC STUDIES: I have personally reviewed the radiological images as listed and agreed with the findings in the report. No results found.   ASSESSMENT & PLAN:   62 y.o. very pleasant male with  1. R-ISS stage III multiple myeloma with extensive bone metastases and patholgiic fracture rt shoulder -Bone marrow biopsy and aspiration done 02/06/2022 revealed hypercellular bone marrow with plasma cell neoplasm. 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  Plan -Labs done today were reviewed in detail and adequate for treatment. WBC 4.0, Hgb 15.0, Plt 189, Creatinine, calcium and total protein levels normal.  -Most recent myeloma panel from  04/23/2023 showed M spike not detected -No significant new toxicities from his current treatment regimen -Continue carfilzomib/Revlimid/dexamethasone regimen -Continue Revlimid at 15 mg p.o. daily for 3 weeks on 1 week off with Eliquis for VTE prophylaxis -  Continue monthly Zometa, next dose due today.  -Continue a Eliquis for VTE prophylaxis -Continue acyclovir for VZV prophylaxis -Continue vitamin B12 supplement, 1000 mcg PO daily, will recheck levels in 3 months.   Follow up: RTC in 2 weeks with labs and Carfilzomib/Dex treatment.  RTC in 4 weeks for labs and follow up visit with Dr. Candise Che before Carfilzomib/Dex + Zometa treatment  All of the patient's questions were answered with apparent satisfaction. The patient knows to call the clinic with any problems, questions or concerns.  I have spent a total of 30 minutes minutes of  face-to-face and non-face-to-face time, preparing to see the patient, performing a medically appropriate examination, counseling and educating the patient,documenting clinical information in the electronic health record,and care coordination.   Georga Kaufmann PA-C Dept of Hematology and Oncology [facility] at [facility] Phone: [phone]

## 2023-05-07 NOTE — Patient Instructions (Signed)
[place] [facility] AT [facility]  Discharge Instructions: Thank you for choosing [facility] to provide your oncology and hematology care.   If you have a lab appointment with the Cancer Center, please go directly to the Cancer Center and check in at the registration area.   Wear comfortable clothing and clothing appropriate for easy access to any Portacath or PICC line.   We strive to give you quality time with your provider. You may need to reschedule your appointment if you arrive late (15 or more minutes).  Arriving late affects you and other patients whose appointments are after yours.  Also, if you miss three or more appointments without notifying the office, you may be dismissed from the clinic at the provider's discretion.      For prescription refill requests, have your pharmacy contact our office and allow 72 hours for refills to be completed.    Today you received the following chemotherapy and/or immunotherapy agents: Kyprolis      To help prevent nausea and vomiting after your treatment, we encourage you to take your nausea medication as directed.  BELOW ARE SYMPTOMS THAT SHOULD BE REPORTED IMMEDIATELY: *FEVER GREATER THAN 100.4 F (38 C) OR HIGHER *CHILLS OR SWEATING *NAUSEA AND VOMITING THAT IS NOT CONTROLLED WITH YOUR NAUSEA MEDICATION *UNUSUAL SHORTNESS OF BREATH *UNUSUAL BRUISING OR BLEEDING *URINARY PROBLEMS (pain or burning when urinating, or frequent urination) *BOWEL PROBLEMS (unusual diarrhea, constipation, pain near the anus) TENDERNESS IN MOUTH AND THROAT WITH OR WITHOUT PRESENCE OF ULCERS (sore throat, sores in mouth, or a toothache) UNUSUAL RASH, SWELLING OR PAIN  UNUSUAL VAGINAL DISCHARGE OR ITCHING   Items with * indicate a potential emergency and should be followed up as soon as possible or go to the Emergency Department if any problems should occur.  Please show the CHEMOTHERAPY ALERT CARD or IMMUNOTHERAPY ALERT CARD at  check-in to the Emergency Department and triage nurse.  Should you have questions after your visit or need to cancel or reschedule your appointment, please contact [place] [facility] AT [facility]  Dept: [phone]  and follow the prompts.  Office hours are 8:00 a.m. to 4:30 p.m. Monday - Friday. Please note that voicemails left after 4:00 p.m. may not be returned until the following business day.  We are closed weekends and major holidays. You have access to a nurse at all times for urgent questions. Please call the main number to the clinic Dept: [phone] and follow the prompts.   For any non-urgent questions, you may also contact your provider using MyChart. We now offer e-Visits for anyone 101 and older to request care online for non-urgent symptoms. For details visit [email or web address].   Also download the MyChart app! Go to the app store, search "MyChart", open the app, select [place], and log in with your MyChart username and password.

## 2023-05-16 ENCOUNTER — Other Ambulatory Visit: Payer: Self-pay

## 2023-05-16 DIAGNOSIS — Z23 Encounter for immunization: Secondary | ICD-10-CM | POA: Diagnosis not present

## 2023-05-16 DIAGNOSIS — C9000 Multiple myeloma not having achieved remission: Secondary | ICD-10-CM

## 2023-05-19 DIAGNOSIS — G4719 Other hypersomnia: Secondary | ICD-10-CM | POA: Diagnosis not present

## 2023-05-20 ENCOUNTER — Encounter: Payer: Self-pay | Admitting: Hematology

## 2023-05-20 ENCOUNTER — Other Ambulatory Visit: Payer: Self-pay

## 2023-05-20 DIAGNOSIS — C9000 Multiple myeloma not having achieved remission: Secondary | ICD-10-CM

## 2023-05-20 MED ORDER — REVLIMID 15 MG PO CAPS
ORAL_CAPSULE | ORAL | 0 refills | Status: DC
Start: 2023-05-20 — End: 2023-06-18

## 2023-05-20 MED ORDER — FENTANYL 25 MCG/HR TD PT72: 1.0000 | MEDICATED_PATCH | TRANSDERMAL | 0 refills | Status: DC

## 2023-05-21 ENCOUNTER — Inpatient Hospital Stay: Payer: No Typology Code available for payment source | Attending: Physician Assistant

## 2023-05-21 ENCOUNTER — Inpatient Hospital Stay: Payer: No Typology Code available for payment source

## 2023-05-21 ENCOUNTER — Encounter: Payer: Self-pay | Admitting: Hematology

## 2023-05-21 VITALS — BP 156/94 | HR 62 | Temp 98.2°F | Resp 18 | Wt 196.4 lb

## 2023-05-21 DIAGNOSIS — R059 Cough, unspecified: Secondary | ICD-10-CM | POA: Insufficient documentation

## 2023-05-21 DIAGNOSIS — Z87891 Personal history of nicotine dependence: Secondary | ICD-10-CM | POA: Insufficient documentation

## 2023-05-21 DIAGNOSIS — Z808 Family history of malignant neoplasm of other organs or systems: Secondary | ICD-10-CM | POA: Insufficient documentation

## 2023-05-21 DIAGNOSIS — Z7189 Other specified counseling: Secondary | ICD-10-CM

## 2023-05-21 DIAGNOSIS — Z809 Family history of malignant neoplasm, unspecified: Secondary | ICD-10-CM | POA: Insufficient documentation

## 2023-05-21 DIAGNOSIS — M25511 Pain in right shoulder: Secondary | ICD-10-CM | POA: Diagnosis not present

## 2023-05-21 DIAGNOSIS — Z5112 Encounter for antineoplastic immunotherapy: Secondary | ICD-10-CM | POA: Insufficient documentation

## 2023-05-21 DIAGNOSIS — Z79891 Long term (current) use of opiate analgesic: Secondary | ICD-10-CM | POA: Diagnosis not present

## 2023-05-21 DIAGNOSIS — C9000 Multiple myeloma not having achieved remission: Secondary | ICD-10-CM | POA: Diagnosis present

## 2023-05-21 DIAGNOSIS — R0602 Shortness of breath: Secondary | ICD-10-CM | POA: Insufficient documentation

## 2023-05-21 DIAGNOSIS — D72819 Decreased white blood cell count, unspecified: Secondary | ICD-10-CM | POA: Diagnosis not present

## 2023-05-21 DIAGNOSIS — M545 Low back pain, unspecified: Secondary | ICD-10-CM | POA: Diagnosis not present

## 2023-05-21 LAB — CMP (CANCER CENTER ONLY)
ALT: 13 U/L (ref 0–44)
AST: 14 U/L — ABNORMAL LOW (ref 15–41)
Albumin: 4.2 g/dL (ref 3.5–5.0)
Alkaline Phosphatase: 57 U/L (ref 38–126)
Anion gap: 6 (ref 5–15)
BUN: 11 mg/dL (ref 8–23)
CO2: 24 mmol/L (ref 22–32)
Calcium: 9.3 mg/dL (ref 8.9–10.3)
Chloride: 106 mmol/L (ref 98–111)
Creatinine: 0.86 mg/dL (ref 0.61–1.24)
GFR, Estimated: >60 mL/min (ref >60)
Glucose, Bld: 106 mg/dL — ABNORMAL HIGH (ref 70–99)
Potassium: 4.1 mmol/L (ref 3.5–5.1)
Sodium: 136 mmol/L (ref 135–145)
Total Bilirubin: 0.7 mg/dL (ref 0.3–1.2)
Total Protein: 6.2 g/dL — ABNORMAL LOW (ref 6.5–8.1)

## 2023-05-21 LAB — CBC WITH DIFFERENTIAL (CANCER CENTER ONLY)
Abs Immature Granulocytes: 0.01 10*3/uL (ref 0.00–0.07)
Basophils Absolute: 0 10*3/uL (ref 0.0–0.1)
Basophils Relative: 1 %
Eosinophils Absolute: 0.3 10*3/uL (ref 0.0–0.5)
Eosinophils Relative: 9 %
HCT: 42.2 % (ref 39.0–52.0)
Hemoglobin: 14.2 g/dL (ref 13.0–17.0)
Immature Granulocytes: 0 %
Lymphocytes Relative: 30 %
Lymphs Abs: 0.9 10*3/uL (ref 0.7–4.0)
MCH: 29.1 pg (ref 26.0–34.0)
MCHC: 33.6 g/dL (ref 30.0–36.0)
MCV: 86.5 fL (ref 80.0–100.0)
Monocytes Absolute: 0.4 10*3/uL (ref 0.1–1.0)
Monocytes Relative: 13 %
Neutro Abs: 1.4 10*3/uL — ABNORMAL LOW (ref 1.7–7.7)
Neutrophils Relative %: 47 %
Platelet Count: 159 10*3/uL (ref 150–400)
RBC: 4.88 MIL/uL (ref 4.22–5.81)
RDW: 14.1 % (ref 11.5–15.5)
WBC Count: 3 10*3/uL — ABNORMAL LOW (ref 4.0–10.5)
nRBC: 0 % (ref 0.0–0.2)

## 2023-05-21 MED ORDER — PALONOSETRON HCL INJECTION 0.25 MG/5ML
0.2500 mg | Freq: Once | INTRAVENOUS | Status: AC
  Administered 2023-05-21: 0.25 mg via INTRAVENOUS
  Filled 2023-05-21: qty 5

## 2023-05-21 MED ORDER — HEPARIN SOD (PORK) LOCK FLUSH 100 UNIT/ML IV SOLN: 500.0000 [IU] | Freq: Once | INTRAVENOUS | Status: DC | PRN

## 2023-05-21 MED ORDER — SODIUM CHLORIDE 0.9 % IV SOLN: Freq: Once | INTRAVENOUS | Status: AC

## 2023-05-21 MED ORDER — DEXTROSE 5 % IV SOLN
70.0000 mg/m2 | Freq: Once | INTRAVENOUS | Status: AC
  Administered 2023-05-21: 140 mg via INTRAVENOUS
  Filled 2023-05-21: qty 60

## 2023-05-21 MED ORDER — SODIUM CHLORIDE 0.9% FLUSH: 10.0000 mL | INTRAVENOUS | Status: DC | PRN

## 2023-05-21 MED ORDER — ACETAMINOPHEN 325 MG PO TABS
650.0000 mg | ORAL_TABLET | Freq: Once | ORAL | Status: AC
  Administered 2023-05-21: 650 mg via ORAL
  Filled 2023-05-21: qty 2

## 2023-05-21 MED ORDER — DEXAMETHASONE SODIUM PHOSPHATE 10 MG/ML IJ SOLN
8.0000 mg | Freq: Once | INTRAMUSCULAR | Status: AC
  Administered 2023-05-21: 8 mg via INTRAVENOUS
  Filled 2023-05-21: qty 1

## 2023-05-21 NOTE — Patient Instructions (Signed)
[facility]  Discharge Instructions: Thank you for choosing [facility] to provide your oncology and hematology care.   If you have a lab appointment with the Cancer Center, please go directly to the Cancer Center and check in at the registration area.   Wear comfortable clothing and clothing appropriate for easy access to any Portacath or PICC line.   We strive to give you quality time with your provider. You may need to reschedule your appointment if you arrive late (15 or more minutes).  Arriving late affects you and other patients whose appointments are after yours.  Also, if you miss three or more appointments without notifying the office, you may be dismissed from the clinic at the provider's discretion.      For prescription refill requests, have your pharmacy contact our office and allow 72 hours for refills to be completed.    Today you received the following chemotherapy and/or immunotherapy agents :  Kyprolis   To help prevent nausea and vomiting after your treatment, we encourage you to take your nausea medication as directed.  BELOW ARE SYMPTOMS THAT SHOULD BE REPORTED IMMEDIATELY: *FEVER GREATER THAN 100.4 F (38 C) OR HIGHER *CHILLS OR SWEATING *NAUSEA AND VOMITING THAT IS NOT CONTROLLED WITH YOUR NAUSEA MEDICATION *UNUSUAL SHORTNESS OF BREATH *UNUSUAL BRUISING OR BLEEDING *URINARY PROBLEMS (pain or burning when urinating, or frequent urination) *BOWEL PROBLEMS (unusual diarrhea, constipation, pain near the anus) TENDERNESS IN MOUTH AND THROAT WITH OR WITHOUT PRESENCE OF ULCERS (sore throat, sores in mouth, or a toothache) UNUSUAL RASH, SWELLING OR PAIN  UNUSUAL VAGINAL DISCHARGE OR ITCHING   Items with * indicate a potential emergency and should be followed up as soon as possible or go to the Emergency Department if any problems should occur.  Please show the CHEMOTHERAPY ALERT CARD or IMMUNOTHERAPY ALERT CARD at  check-in to the Emergency Department and triage nurse.  Should you have questions after your visit or need to cancel or reschedule your appointment, please contact [facility]  Dept: [phone]  and follow the prompts.  Office hours are 8:00 a.m. to 4:30 p.m. Monday - Friday. Please note that voicemails left after 4:00 p.m. may not be returned until the following business day.  We are closed weekends and major holidays. You have access to a nurse at all times for urgent questions. Please call the main number to the clinic Dept: [phone] and follow the prompts.   For any non-urgent questions, you may also contact your provider using MyChart. We now offer e-Visits for anyone 18 and older to request care online for non-urgent symptoms. For details visit [email or web address].   Also download the MyChart app! Go to the app store, search "MyChart", open the app, select [place], and log in with your MyChart username and password.   

## 2023-05-21 NOTE — Progress Notes (Signed)
Per Candise Che MD, ok to treat today with ANC 1.4

## 2023-05-23 DIAGNOSIS — H5213 Myopia, bilateral: Secondary | ICD-10-CM | POA: Diagnosis not present

## 2023-05-25 LAB — MULTIPLE MYELOMA PANEL, SERUM
Albumin SerPl Elph-Mcnc: 3.6 g/dL (ref 2.9–4.4)
Albumin/Glob SerPl: 1.7 (ref 0.7–1.7)
Alpha 1: 0.3 g/dL (ref 0.0–0.4)
Alpha2 Glob SerPl Elph-Mcnc: 0.6 g/dL (ref 0.4–1.0)
B-Globulin SerPl Elph-Mcnc: 0.9 g/dL (ref 0.7–1.3)
Gamma Glob SerPl Elph-Mcnc: 0.5 g/dL (ref 0.4–1.8)
Globulin, Total: 2.2 g/dL (ref 2.2–3.9)
IgA: 22 mg/dL — ABNORMAL LOW (ref 61–437)
IgG (Immunoglobin G), Serum: 544 mg/dL — ABNORMAL LOW (ref 603–1613)
IgM (Immunoglobulin M), Srm: 13 mg/dL — ABNORMAL LOW (ref 20–172)
Total Protein ELP: 5.8 g/dL — ABNORMAL LOW (ref 6.0–8.5)

## 2023-05-29 ENCOUNTER — Other Ambulatory Visit: Payer: Self-pay

## 2023-05-29 DIAGNOSIS — C9000 Multiple myeloma not having achieved remission: Secondary | ICD-10-CM

## 2023-05-30 ENCOUNTER — Encounter: Payer: Self-pay | Admitting: Hematology

## 2023-05-30 MED ORDER — OXYCODONE HCL 5 MG PO TABS
5.0000 mg | ORAL_TABLET | ORAL | 0 refills | Status: DC | PRN
Start: 2023-05-30 — End: 2023-07-11

## 2023-06-04 ENCOUNTER — Inpatient Hospital Stay (HOSPITAL_BASED_OUTPATIENT_CLINIC_OR_DEPARTMENT_OTHER): Payer: No Typology Code available for payment source | Admitting: Hematology

## 2023-06-04 ENCOUNTER — Inpatient Hospital Stay: Payer: No Typology Code available for payment source

## 2023-06-04 VITALS — BP 128/78

## 2023-06-04 VITALS — BP 138/104 | HR 67 | Temp 97.9°F | Resp 20 | Wt 194.3 lb

## 2023-06-04 DIAGNOSIS — C9000 Multiple myeloma not having achieved remission: Secondary | ICD-10-CM

## 2023-06-04 DIAGNOSIS — Z5112 Encounter for antineoplastic immunotherapy: Secondary | ICD-10-CM | POA: Diagnosis not present

## 2023-06-04 DIAGNOSIS — Z7189 Other specified counseling: Secondary | ICD-10-CM

## 2023-06-04 LAB — CBC WITH DIFFERENTIAL (CANCER CENTER ONLY)
Abs Immature Granulocytes: 0.02 10*3/uL (ref 0.00–0.07)
Basophils Absolute: 0 10*3/uL (ref 0.0–0.1)
Basophils Relative: 1 %
Eosinophils Absolute: 0.1 10*3/uL (ref 0.0–0.5)
Eosinophils Relative: 4 %
HCT: 45.1 % (ref 39.0–52.0)
Hemoglobin: 15 g/dL (ref 13.0–17.0)
Immature Granulocytes: 1 %
Lymphocytes Relative: 27 %
Lymphs Abs: 0.8 10*3/uL (ref 0.7–4.0)
MCH: 28.4 pg (ref 26.0–34.0)
MCHC: 33.3 g/dL (ref 30.0–36.0)
MCV: 85.3 fL (ref 80.0–100.0)
Monocytes Absolute: 0.3 10*3/uL (ref 0.1–1.0)
Monocytes Relative: 11 %
Neutro Abs: 1.8 10*3/uL (ref 1.7–7.7)
Neutrophils Relative %: 56 %
Platelet Count: 215 10*3/uL (ref 150–400)
RBC: 5.29 MIL/uL (ref 4.22–5.81)
RDW: 14.2 % (ref 11.5–15.5)
WBC Count: 3.1 10*3/uL — ABNORMAL LOW (ref 4.0–10.5)
nRBC: 0 % (ref 0.0–0.2)

## 2023-06-04 LAB — CMP (CANCER CENTER ONLY)
ALT: 17 U/L (ref 0–44)
AST: 18 U/L (ref 15–41)
Albumin: 4.2 g/dL (ref 3.5–5.0)
Alkaline Phosphatase: 46 U/L (ref 38–126)
Anion gap: 10 (ref 5–15)
BUN: 10 mg/dL (ref 8–23)
CO2: 23 mmol/L (ref 22–32)
Calcium: 9.2 mg/dL (ref 8.9–10.3)
Chloride: 105 mmol/L (ref 98–111)
Creatinine: 0.93 mg/dL (ref 0.61–1.24)
GFR, Estimated: >60 mL/min (ref >60)
Glucose, Bld: 77 mg/dL (ref 70–99)
Potassium: 4.1 mmol/L (ref 3.5–5.1)
Sodium: 138 mmol/L (ref 135–145)
Total Bilirubin: 0.9 mg/dL (ref 0.3–1.2)
Total Protein: 6.8 g/dL (ref 6.5–8.1)

## 2023-06-04 MED ORDER — DEXAMETHASONE SODIUM PHOSPHATE 10 MG/ML IJ SOLN
8.0000 mg | Freq: Once | INTRAMUSCULAR | Status: AC
  Administered 2023-06-04: 8 mg via INTRAVENOUS
  Filled 2023-06-04: qty 1

## 2023-06-04 MED ORDER — PALONOSETRON HCL INJECTION 0.25 MG/5ML
0.2500 mg | Freq: Once | INTRAVENOUS | Status: AC
  Administered 2023-06-04: 0.25 mg via INTRAVENOUS
  Filled 2023-06-04: qty 5

## 2023-06-04 MED ORDER — ZOLEDRONIC ACID 4 MG/100ML IV SOLN
4.0000 mg | Freq: Once | INTRAVENOUS | Status: AC
  Administered 2023-06-04: 4 mg via INTRAVENOUS
  Filled 2023-06-04: qty 100

## 2023-06-04 MED ORDER — SODIUM CHLORIDE 0.9 % IV SOLN: Freq: Once | INTRAVENOUS | Status: AC

## 2023-06-04 MED ORDER — DEXTROSE 5 % IV SOLN
70.0000 mg/m2 | Freq: Once | INTRAVENOUS | Status: AC
  Administered 2023-06-04: 140 mg via INTRAVENOUS
  Filled 2023-06-04: qty 60

## 2023-06-04 NOTE — Patient Instructions (Signed)
[place] [facility] AT [facility]  Discharge Instructions: Thank you for choosing [facility] to provide your oncology and hematology care.   If you have a lab appointment with the Cancer Center, please go directly to the Cancer Center and check in at the registration area.   Wear comfortable clothing and clothing appropriate for easy access to any Portacath or PICC line.   We strive to give you quality time with your provider. You may need to reschedule your appointment if you arrive late (15 or more minutes).  Arriving late affects you and other patients whose appointments are after yours.  Also, if you miss three or more appointments without notifying the office, you may be dismissed from the clinic at the provider's discretion.      For prescription refill requests, have your pharmacy contact our office and allow 72 hours for refills to be completed.    Today you received the following chemotherapy and/or immunotherapy agents: Kyprolis.       To help prevent nausea and vomiting after your treatment, we encourage you to take your nausea medication as directed.  BELOW ARE SYMPTOMS THAT SHOULD BE REPORTED IMMEDIATELY: *FEVER GREATER THAN 100.4 F (38 C) OR HIGHER *CHILLS OR SWEATING *NAUSEA AND VOMITING THAT IS NOT CONTROLLED WITH YOUR NAUSEA MEDICATION *UNUSUAL SHORTNESS OF BREATH *UNUSUAL BRUISING OR BLEEDING *URINARY PROBLEMS (pain or burning when urinating, or frequent urination) *BOWEL PROBLEMS (unusual diarrhea, constipation, pain near the anus) TENDERNESS IN MOUTH AND THROAT WITH OR WITHOUT PRESENCE OF ULCERS (sore throat, sores in mouth, or a toothache) UNUSUAL RASH, SWELLING OR PAIN  UNUSUAL VAGINAL DISCHARGE OR ITCHING   Items with * indicate a potential emergency and should be followed up as soon as possible or go to the Emergency Department if any problems should occur.  Please show the CHEMOTHERAPY ALERT CARD or IMMUNOTHERAPY ALERT CARD at  check-in to the Emergency Department and triage nurse.  Should you have questions after your visit or need to cancel or reschedule your appointment, please contact [place] [facility]  Dept: [phone]  and follow the prompts.  Office hours are 8:00 a.m. to 4:30 p.m. Monday - Friday. Please note that voicemails left after 4:00 p.m. may not be returned until the following business day.  We are closed weekends and major holidays. You have access to a nurse at all times for urgent questions. Please call the main number to the clinic Dept: [phone] and follow the prompts.   For any non-urgent questions, you may also contact your provider using MyChart. We now offer e-Visits for anyone 34 and older to request care online for non-urgent symptoms. For details visit [email or web address].   Also download the MyChart app! Go to the app store, search "MyChart", open the app, select [place], and log in with your MyChart username and password.

## 2023-06-04 NOTE — Progress Notes (Signed)
Patient seen by Dr. Addison Naegeli are not all within treatment parameters. BP 138/104 will recheck / New reading 144/80  Labs reviewed: and are within treatment parameters.  Per physician team, patient is ready for treatment and there are NO modifications to the treatment plan.

## 2023-06-04 NOTE — Progress Notes (Signed)
HEMATOLOGY/ONCOLOGY CLINIC NOTE  Date of Service: 06/04/23   Patient Care Team: Milus Height, [place] as PCP - General (Nurse Practitioner)  CHIEF COMPLAINTS/PURPOSE OF CONSULTATION:  Follow-up for continued evaluation and management of multiple myeloma  HISTORY OF PRESENTING ILLNESS:   Anthony [name] is a wonderful 62 y.o. male who has been referred to [place] by Dr Milus Height, PA for evaluation and management of newly diagnosed multiple myeloma. He reports He is doing well.  He reports persistent lower back pain that he has had since he was in his 20's. He notes no previous significant injuries. He further notes that he gets chiropractic adjustments and takes Tylenol to manage his symptoms. He notes less back pain when standing up on his right leg and maintaining weight on his right leg.  He reports previous history of smoking and chewing tobacco over 40 years ago.  He had a recent fall back in February of this year with only a pulled muscle. And he notes another fall when chasing his cat where he fell on his right shoulder. He reports pain in right shoulder.   He had a recent COVID-19 infection back in March.  He reports intermittent cough with SOB. He notes he takes 2 Rolaids at night. We discussed potentially trying antacids which he is agreeable to as he will begin steroids soon and was advised of the possible symptoms and side effects from taking steroids.  We discussed CRAB criteria and that he meets at least two of the criterion being  anemia and bone disease.  We discussed getting additional scans for further evaluation which he is agreeable to.  We further discussed starting Daratumumab/Velcade/Dexamethasone/Zometa and Revlimid for treatment which he is agreeable to. We also discussed starting steroids before treatment and taking an acid suppressant which he was also agreeable to.  We discussed getting Senna and taking it as needed for constipation.  Labs done today  were reviewed in detail.  We discussed his recent bone marrow biopsy and aspiration done 02/06/2022.  We discussed PET/CT scan done 01/28/2022.  We discussed CT right shoulder w/o contrast done 11/13/2021  INTERVAL HISTORY:  Anthony [name] is a 62 y.o. male, is here for continued evaluation of and management of multiple myeloma. Patient is here for toxicity check prior to receiving cycle 15 day 15 of his Carfilzomib/Revimid/Dexamethasone treatment.  Patient was last seen by PA Thayil on 05/07/2023 and reported stable appetite, controlled nausea, stable chronic back pain, and improved chronic fatigue.   Today, he is accompanied by his wife. Patient reports that he has been very active over the last week and complains of hand soreness. He has no other new bone pain besides those explained by additional activity. He denies any new dental issues.  Patient reports that he did receive the Prevnar 20 vaccine last year.   He reports that he has initiated a sleep study due to having symptoms. Patient reports needing to gasp for air a few times one month ago. Patient notes endorsing significant symptoms one year ago and notes needing to sleep in a recliner at that time.   Patient reports that his breathing/gasping issues may be related to allergies. He takes Claritin every morning.   He reports that he has not needed to take much Oxycodone. Patient notes that he has taken half a 5 mg tablet for management.   MEDICAL HISTORY:  Past Medical History:  Diagnosis Date   Back pain    Cancer (HCC)  Multiple myeloma (HCC) 02/15/2022    SURGICAL HISTORY: Past Surgical History:  Procedure Laterality Date   IR BONE TUMOR(S)RF ABLATION  05/07/2022   IR BONE TUMOR(S)RF ABLATION  05/07/2022   IR BONE TUMOR(S)RF ABLATION  05/07/2022   IR KYPHO EA ADDL LEVEL THORACIC OR LUMBAR  05/07/2022   IR KYPHO LUMBAR INC FX REDUCE BONE BX UNI/BIL CANNULATION INC/IMAGING  05/07/2022   IR KYPHO THORACIC WITH BONE  BIOPSY  05/07/2022   IR RADIOLOGIST EVAL & MGMT  04/23/2022   IR RADIOLOGIST EVAL & MGMT  05/17/2022   IR RADIOLOGIST EVAL & MGMT  06/06/2022    SOCIAL HISTORY: Social History   Socioeconomic History   Marital status: Married    Spouse name: Not on file   Number of children: Not on file   Years of education: Not on file   Highest education level: Not on file  Occupational History   Not on file  Tobacco Use   Smoking status: Never   Smokeless tobacco: Former    Types: Chew, Snuff  Vaping Use   Vaping status: Never Used  Substance and Sexual Activity   Alcohol use: Yes    Alcohol/week: 2.0 standard drinks of alcohol    Types: 2 Glasses of wine per week   Drug use: No   Sexual activity: Yes  Other Topics Concern   Not on file  Social History Narrative   Not on file   Social Determinants of Health   Financial Resource Strain: Low Risk  (05/07/2022)   Overall Financial Resource Strain (CARDIA)    Difficulty of Paying Living Expenses: Not very hard  Food Insecurity: No Food Insecurity (05/07/2022)   Hunger Vital Sign    Worried About Running Out of Food in the Last Year: Never true    Ran Out of Food in the Last Year: Never true  Transportation Needs: No Transportation Needs (05/07/2022)   PRAPARE - Administrator, Civil Service (Medical): No    Lack of Transportation (Non-Medical): No  Physical Activity: Not on file  Stress: Not on file  Social Connections: Not on file  Intimate Partner Violence: Unknown (05/07/2022)   Humiliation, Afraid, Rape, and Kick questionnaire    Fear of Current or Ex-Partner: No    Emotionally Abused: No    Physically Abused: No    Sexually Abused: Not on file    FAMILY HISTORY: Family History  Problem Relation Age of Onset   Rheum arthritis Mother    Other Sister        Pre-cancerous uterine mass;    Rheum arthritis Sister    Bone cancer Paternal Grandfather    Brain cancer Maternal Uncle    Brain cancer Maternal Aunt     Osteoporosis Neg Hx     ALLERGIES:  has No Known Allergies.  MEDICATIONS:  Current Outpatient Medications  Medication Sig Dispense Refill   acyclovir (ZOVIRAX) 400 MG tablet Take 1 tablet (400 mg total) by mouth 2 (two) times daily. 60 tablet 11   aluminum chloride (DRYSOL) 20 % external solution Apply 1 application topically at bedtime.     calcium-vitamin D (OSCAL WITH D) 250-125 MG-UNIT tablet Take 1 tablet by mouth daily.     cyanocobalamin (VITAMIN B12) 1000 MCG tablet Take 1 tablet (1,000 mcg total) by mouth daily. 30 tablet 3   cyclobenzaprine (FLEXERIL) 10 MG tablet Take 10 mg by mouth 3 (three) times daily as needed for muscle spasms.     diclofenac sodium (VOLTAREN)  1 % GEL Apply 2 g topically as needed.     dronabinol (MARINOL) 10 MG capsule Take 1 capsule (10 mg total) by mouth 2 (two) times daily before a meal. 60 capsule 0   ELIQUIS 5 MG TABS tablet Take 1 tablet (5 mg total) by mouth 2 (two) times daily. 60 tablet 5   ergocalciferol (VITAMIN D2) 1.25 MG (50000 UT) capsule Take 1 capsule (50,000 Units total) by mouth 2 (two) times a week. 12 capsule 2   esomeprazole (NEXIUM) 40 MG capsule Take 1 capsule (40 mg total) by mouth daily. Take 1 capsule (40 mg total) daily before breakfast 30 capsule 1   fentaNYL (DURAGESIC) 25 MCG/HR Place 1 patch onto the skin every 3 (three) days. 10 patch 0   furosemide (LASIX) 20 MG tablet Take 1 tablet (20 mg total) by mouth daily. 30 tablet 1   lidocaine (LIDODERM) 5 % Place 1 patch onto the skin daily. Remove & Discard patch within 12 hours or as directed by MD 30 patch 0   LORazepam (ATIVAN) 0.5 MG tablet Take 1 tablet (0.5 mg total) by mouth every 6 (six) hours as needed (Nausea or vomiting). 30 tablet 0   methocarbamol (ROBAXIN) 500 MG tablet Take 1 tablet (500 mg total) by mouth every 8 (eight) hours as needed for muscle spasms. 30 tablet 0   methylPREDNISolone (MEDROL DOSEPAK) 4 MG TBPK tablet Take 6 pills by mouth day 1, 5 on day 2, 4 on  day 3, 3 on day 4, 2 on day 5, 1 on day 6 21 tablet 0   Multiple Vitamin (MULTIVITAMIN) tablet Take 1 tablet by mouth daily.     Omega-3 Fatty Acids (FISH OIL) 1360 MG CAPS Take 1 capsule by mouth daily.     ondansetron (ZOFRAN) 8 MG tablet Take 1 tablet (8 mg total) by mouth every 8 (eight) hours as needed for nausea or vomiting. 30 tablet 0   oxyCODONE (ROXICODONE) 5 MG immediate release tablet Take 1 tablet (5 mg total) by mouth every 4 (four) hours as needed for severe pain or moderate pain. Take 1 to 2 tablets every 4 hours as needed for moderate to severe pain. 60 tablet 0   prochlorperazine (COMPAZINE) 10 MG tablet TAKE ONE TABLET BY MOUTH EVERY 6 HOURS AS NEEDED FOR NAUSEA AND VOMITING 30 tablet 1   REVLIMID 15 MG capsule TAKE 1 CAPSULE DAILY FOR 21 DAYS ON, THEN 7 DAYS OFF 21 capsule 0   senna-docusate (SENNA S) 8.6-50 MG tablet Take 2 tablets by mouth at bedtime. 60 tablet 1   terbinafine (LAMISIL) 1 % cream Apply 1 application topically 2 (two) times daily.     No current facility-administered medications for this visit.   REVIEW OF SYSTEMS:  10 Point review of Systems was done is negative except as noted above.   PHYSICAL EXAMINATION: .BP (!) 138/104   Pulse 67   Temp 97.9 F (36.6 C)   Resp 20   Wt 194 lb 4.8 oz (88.1 kg)   SpO2 100%   BMI 30.43 kg/m   GENERAL:alert, in no acute distress and comfortable SKIN: no acute rashes, no significant lesions EYES: conjunctiva are pink and non-injected, sclera anicteric OROPHARYNX: MMM, no exudates, no oropharyngeal erythema or ulceration NECK: supple, no JVD LYMPH:  no palpable lymphadenopathy in the cervical, axillary or inguinal regions LUNGS: clear to auscultation b/l with normal respiratory effort HEART: regular rate & rhythm ABDOMEN:  normoactive bowel sounds , non tender, not distended.  Extremity: no pedal edema PSYCH: alert & oriented x 3 with fluent speech NEURO: no focal motor/sensory deficits   LABORATORY DATA:   I have reviewed the data as listed .    Latest Ref Rng & Units 06/04/2023   10:24 AM 05/21/2023    8:38 AM 05/07/2023   10:35 AM  CBC  WBC 4.0 - 10.5 K/uL 3.1  3.0  4.0   Hemoglobin 13.0 - 17.0 g/dL 28.4  13.2  44.0   Hematocrit 39.0 - 52.0 % 45.1  42.2  43.0   Platelets 150 - 400 K/uL 215  159  189       Latest Ref Rng & Units 06/04/2023   10:24 AM 05/21/2023    8:38 AM 05/07/2023   10:35 AM  CMP  Glucose 70 - 99 mg/dL 77  102  90   BUN 8 - 23 mg/dL 10  11  14    Creatinine 0.61 - 1.24 mg/dL 7.25  3.66  4.40   Sodium 135 - 145 mmol/L 138  136  135   Potassium 3.5 - 5.1 mmol/L 4.1  4.1  4.1   Chloride 98 - 111 mmol/L 105  106  105   CO2 22 - 32 mmol/L 23  24  24    Calcium 8.9 - 10.3 mg/dL 9.2  9.3  9.4   Total Protein 6.5 - 8.1 g/dL 6.8  6.2  6.5   Total Bilirubin 0.3 - 1.2 mg/dL 0.9  0.7  1.0   Alkaline Phos 38 - 126 U/L 46  57  48   AST 15 - 41 U/L 18  14  13    ALT 0 - 44 U/L 17  13  14      PATHOLOGY Surgical Pathology CASE: [id number] PATIENT: Anthony [name] Bone Marrow Report     Clinical History: Multiple Myeloma     DIAGNOSIS:  BONE MARROW, ASPIRATE, CLOT, CORE: -Hypercellular bone marrow (60%) with erythroid predominant trilineage hematopoiesis and no evidence of residual plasma cell neoplasm (less than 1% plasma cells by manual aspirate differential, and CD138 immunohistochemical analysis of clot and core sections).  PERIPHERAL BLOOD: -Leukopenia  MICROSCOPIC DESCRIPTION:  PERIPHERAL BLOOD SMEAR: Platelets: Adequate in number, no platelet clumps identified Erythroid: Normocytic normochromic red blood cells Leukocytes: Mild leukopenia, negative for dysplastic granulocytes, blasts or  plasma cells  BONE MARROW ASPIRATE: Cellular Erythroid precursors: Relatively increased, show a full sequence of maturation with occasional abnormal forms (including binucleate forms, megaloblastoid change and nuclear membrane irregularities) Granulocytic  precursors: Decreased, show a full sequence of generally orderly maturation Megakaryocytes: Normal in number and morphology Lymphocytes/plasma cells: Not increased   02/06/2022 Molecular pathology   RADIOGRAPHIC STUDIES: I have personally reviewed the radiological images as listed and agreed with the findings in the report. No results found.   ASSESSMENT & PLAN:   62 y.o. very pleasant male with  1. R-ISS stage III multiple myeloma with extensive bone metastases and patholgiic fracture rt shoulder -Recent bone marrow biopsy and aspiration done 02/06/2022 revealed hypercellular bone marrow with plasma cell neoplasm. Cytogenetics-normal karyotype Molecular cytogenetics-TP53 deletion and duplication of 1 q.Marland Kitchen  PLAN:  -Discussed lab results on 06/04/23 in detail with patient. CBC showed WBC of 3.1K, hemoglobin of 15.0, and platelets of 215K. -mild leukopenia, no neutropenia -educated patient that his WBCs are may fluctuate based on Revlimid cycle -last myeloma lab from 2 weeks ago showed undetectable M protein and patient continued to be in remission -CMP stable -no clinical sign or evidence of multiple myeloma  progression at this time -continue maintenance Revlimid treatment and maintenance Carfilzomib -continue 1,000 mcg daily vitamin B12  -continue B complex supplements -continue vitamin D once a week -okay to take tylenol for bone pain if pain is not significantly bothersome -discussed goal to minimize sedatives as much as possible especially if there is any concerns for sleep apnea.  -there may be a role to wean off of fentanyl patch if there is no need for oxycodone for 3-4 months -answered all of patient's and his wife's questions in detail   FOLLOW-UP: Per integrated scheduling   The total time spent in the appointment was 30 minutes* .  All of the patient's questions were answered with apparent satisfaction. The patient knows to call the clinic with any problems,  questions or concerns.   Wyvonnia Lora MD MS AAHIVMS [facility] Hematology/Oncology Physician [facility]  .*Total Encounter Time as defined by the Centers for Medicare and Medicaid Services includes, in addition to the face-to-face time of a patient visit (documented in the note above) non-face-to-face time: obtaining and reviewing outside history, ordering and reviewing medications, tests or procedures, care coordination (communications with other health care professionals or caregivers) and documentation in the medical record.    I,Mitra Faeizi,acting as a Neurosurgeon for Wyvonnia Lora, MD.,have documented all relevant documentation on the behalf of Wyvonnia Lora, MD,as directed by  Wyvonnia Lora, MD while in the presence of Wyvonnia Lora, MD.  .I have reviewed the above documentation for accuracy and completeness, and I agree with the above. Johney Maine MD

## 2023-06-07 ENCOUNTER — Other Ambulatory Visit: Payer: Self-pay

## 2023-06-10 ENCOUNTER — Encounter: Payer: Self-pay | Admitting: Hematology

## 2023-06-10 DIAGNOSIS — R3914 Feeling of incomplete bladder emptying: Secondary | ICD-10-CM | POA: Diagnosis not present

## 2023-06-10 DIAGNOSIS — N401 Enlarged prostate with lower urinary tract symptoms: Secondary | ICD-10-CM | POA: Diagnosis not present

## 2023-06-11 DIAGNOSIS — H527 Unspecified disorder of refraction: Secondary | ICD-10-CM | POA: Diagnosis not present

## 2023-06-11 DIAGNOSIS — C9000 Multiple myeloma not having achieved remission: Secondary | ICD-10-CM | POA: Diagnosis not present

## 2023-06-11 DIAGNOSIS — H04123 Dry eye syndrome of bilateral lacrimal glands: Secondary | ICD-10-CM | POA: Diagnosis not present

## 2023-06-11 DIAGNOSIS — G4733 Obstructive sleep apnea (adult) (pediatric): Secondary | ICD-10-CM | POA: Diagnosis not present

## 2023-06-11 DIAGNOSIS — R030 Elevated blood-pressure reading, without diagnosis of hypertension: Secondary | ICD-10-CM | POA: Diagnosis not present

## 2023-06-11 DIAGNOSIS — H2513 Age-related nuclear cataract, bilateral: Secondary | ICD-10-CM | POA: Diagnosis not present

## 2023-06-17 ENCOUNTER — Inpatient Hospital Stay: Payer: No Typology Code available for payment source

## 2023-06-17 ENCOUNTER — Inpatient Hospital Stay: Payer: No Typology Code available for payment source | Admitting: Dietician

## 2023-06-17 VITALS — BP 125/79 | HR 63 | Temp 98.6°F | Resp 18 | Wt 192.5 lb

## 2023-06-17 DIAGNOSIS — C9000 Multiple myeloma not having achieved remission: Secondary | ICD-10-CM

## 2023-06-17 DIAGNOSIS — Z7189 Other specified counseling: Secondary | ICD-10-CM

## 2023-06-17 DIAGNOSIS — Z5112 Encounter for antineoplastic immunotherapy: Secondary | ICD-10-CM | POA: Diagnosis not present

## 2023-06-17 LAB — CBC WITH DIFFERENTIAL (CANCER CENTER ONLY)
Abs Immature Granulocytes: 0.01 10*3/uL (ref 0.00–0.07)
Basophils Absolute: 0 10*3/uL (ref 0.0–0.1)
Basophils Relative: 1 %
Eosinophils Absolute: 0.2 10*3/uL (ref 0.0–0.5)
Eosinophils Relative: 6 %
HCT: 43.7 % (ref 39.0–52.0)
Hemoglobin: 14.6 g/dL (ref 13.0–17.0)
Immature Granulocytes: 0 %
Lymphocytes Relative: 27 %
Lymphs Abs: 1 10*3/uL (ref 0.7–4.0)
MCH: 28.3 pg (ref 26.0–34.0)
MCHC: 33.4 g/dL (ref 30.0–36.0)
MCV: 84.7 fL (ref 80.0–100.0)
Monocytes Absolute: 0.5 10*3/uL (ref 0.1–1.0)
Monocytes Relative: 12 %
Neutro Abs: 2.1 10*3/uL (ref 1.7–7.7)
Neutrophils Relative %: 54 %
Platelet Count: 161 10*3/uL (ref 150–400)
RBC: 5.16 MIL/uL (ref 4.22–5.81)
RDW: 13.8 % (ref 11.5–15.5)
WBC Count: 3.9 10*3/uL — ABNORMAL LOW (ref 4.0–10.5)
nRBC: 0 % (ref 0.0–0.2)

## 2023-06-17 LAB — CMP (CANCER CENTER ONLY)
ALT: 15 U/L (ref 0–44)
AST: 14 U/L — ABNORMAL LOW (ref 15–41)
Albumin: 4.2 g/dL (ref 3.5–5.0)
Alkaline Phosphatase: 48 U/L (ref 38–126)
Anion gap: 8 (ref 5–15)
BUN: 11 mg/dL (ref 8–23)
CO2: 24 mmol/L (ref 22–32)
Calcium: 9 mg/dL (ref 8.9–10.3)
Chloride: 106 mmol/L (ref 98–111)
Creatinine: 0.88 mg/dL (ref 0.61–1.24)
GFR, Estimated: >60 mL/min (ref >60)
Glucose, Bld: 99 mg/dL (ref 70–99)
Potassium: 4 mmol/L (ref 3.5–5.1)
Sodium: 138 mmol/L (ref 135–145)
Total Bilirubin: 0.9 mg/dL (ref 0.3–1.2)
Total Protein: 6.4 g/dL — ABNORMAL LOW (ref 6.5–8.1)

## 2023-06-17 MED ORDER — PALONOSETRON HCL INJECTION 0.25 MG/5ML
0.2500 mg | Freq: Once | INTRAVENOUS | Status: AC
  Administered 2023-06-17: 0.25 mg via INTRAVENOUS
  Filled 2023-06-17: qty 5

## 2023-06-17 MED ORDER — SODIUM CHLORIDE 0.9 % IV SOLN: Freq: Once | INTRAVENOUS | Status: AC

## 2023-06-17 MED ORDER — DEXAMETHASONE SODIUM PHOSPHATE 10 MG/ML IJ SOLN
8.0000 mg | Freq: Once | INTRAMUSCULAR | Status: AC
  Administered 2023-06-17: 8 mg via INTRAVENOUS
  Filled 2023-06-17: qty 1

## 2023-06-17 MED ORDER — DEXTROSE 5 % IV SOLN
70.0000 mg/m2 | Freq: Once | INTRAVENOUS | Status: AC
  Administered 2023-06-17: 140 mg via INTRAVENOUS
  Filled 2023-06-17: qty 60

## 2023-06-17 MED ORDER — ACETAMINOPHEN 325 MG PO TABS
650.0000 mg | ORAL_TABLET | Freq: Once | ORAL | Status: AC
  Administered 2023-06-17: 650 mg via ORAL
  Filled 2023-06-17: qty 2

## 2023-06-17 NOTE — Progress Notes (Signed)
Nutrition Follow-up:  Patient with multiple myeloma. He is receiving kyprolis + darzalex q28d (start 04/07/22)    Met with pt in infusion. He reports one week of diarrhea and "low-grade" nausea. This has resolved. Patient reports eating a lot of ice cream and tomatoes during this time and feels one or both contributed to symptoms. Patient shares that he would like to lose wt given he has 3 vertebrae that have collapsed, losing ~3 inches of height. Patient reports pain is well controlled with fentanyl patch. He is interested in increasing physical activity to promote lean body mass. His wife has done water aerobics and he is contemplating joining her. He would also like to do resistance training.   Medications: reviewed   Labs: reviewed   Anthropometrics: Wt 192 lb 8 oz today decreased   10/2 - 196 lb 6.4 oz 9/4 - 199 lb    NUTRITION DIAGNOSIS: Unintended wt loss - ongoing   INTERVENTION:  Include good sources of lean proteins at every meal Consider referral for PT Take nausea mediations as prescribed     MONITORING, EVALUATION, GOAL: wt trends, intake   NEXT VISIT: Wednesday December 11 during infusion

## 2023-06-17 NOTE — Patient Instructions (Signed)
[facility] [facility]  Discharge Instructions: Thank you for choosing [facility] to provide your oncology and hematology care.   If you have a lab appointment with the Cancer Center, please go directly to the Cancer Center and check in at the registration area.   Wear comfortable clothing and clothing appropriate for easy access to any Portacath or PICC line.   We strive to give you quality time with your provider. You may need to reschedule your appointment if you arrive late (15 or more minutes).  Arriving late affects you and other patients whose appointments are after yours.  Also, if you miss three or more appointments without notifying the office, you may be dismissed from the clinic at the provider's discretion.      For prescription refill requests, have your pharmacy contact our office and allow 72 hours for refills to be completed.    Today you received the following chemotherapy and/or immunotherapy agents: Kyprolis (carfilzomib)      To help prevent nausea and vomiting after your treatment, we encourage you to take your nausea medication as directed.  BELOW ARE SYMPTOMS THAT SHOULD BE REPORTED IMMEDIATELY: *FEVER GREATER THAN 100.4 F (38 C) OR HIGHER *CHILLS OR SWEATING *NAUSEA AND VOMITING THAT IS NOT CONTROLLED WITH YOUR NAUSEA MEDICATION *UNUSUAL SHORTNESS OF BREATH *UNUSUAL BRUISING OR BLEEDING *URINARY PROBLEMS (pain or burning when urinating, or frequent urination) *BOWEL PROBLEMS (unusual diarrhea, constipation, pain near the anus) TENDERNESS IN MOUTH AND THROAT WITH OR WITHOUT PRESENCE OF ULCERS (sore throat, sores in mouth, or a toothache) UNUSUAL RASH, SWELLING OR PAIN  UNUSUAL VAGINAL DISCHARGE OR ITCHING   Items with * indicate a potential emergency and should be followed up as soon as possible or go to the Emergency Department if any problems should occur.  Please show the CHEMOTHERAPY ALERT CARD or IMMUNOTHERAPY ALERT  CARD at check-in to the Emergency Department and triage nurse.  Should you have questions after your visit or need to cancel or reschedule your appointment, please contact [facility]  Dept: [phone]  and follow the prompts.  Office hours are 8:00 a.m. to 4:30 p.m. Monday - Friday. Please note that voicemails left after 4:00 p.m. may not be returned until the following business day.  We are closed weekends and major holidays. You have access to a nurse at all times for urgent questions. Please call the main number to the clinic Dept: [phone] and follow the prompts.   For any non-urgent questions, you may also contact your provider using MyChart. We now offer e-Visits for anyone 67 and older to request care online for non-urgent symptoms. For details visit [email or web address].   Also download the MyChart app! Go to the app store, search "MyChart", open the app, select [place], and log in with your MyChart username and password.

## 2023-06-18 ENCOUNTER — Other Ambulatory Visit: Payer: Self-pay | Admitting: Hematology

## 2023-06-18 ENCOUNTER — Other Ambulatory Visit: Payer: BC Managed Care – PPO

## 2023-06-18 ENCOUNTER — Ambulatory Visit: Payer: BC Managed Care – PPO

## 2023-06-18 DIAGNOSIS — C9000 Multiple myeloma not having achieved remission: Secondary | ICD-10-CM

## 2023-06-19 ENCOUNTER — Encounter: Payer: Self-pay | Admitting: Hematology

## 2023-06-19 ENCOUNTER — Other Ambulatory Visit: Payer: Self-pay

## 2023-06-19 DIAGNOSIS — C9000 Multiple myeloma not having achieved remission: Secondary | ICD-10-CM

## 2023-06-20 LAB — MULTIPLE MYELOMA PANEL, SERUM
Albumin SerPl Elph-Mcnc: 4 g/dL (ref 2.9–4.4)
Albumin/Glob SerPl: 1.9 — ABNORMAL HIGH (ref 0.7–1.7)
Alpha 1: 0.2 g/dL (ref 0.0–0.4)
Alpha2 Glob SerPl Elph-Mcnc: 0.6 g/dL (ref 0.4–1.0)
B-Globulin SerPl Elph-Mcnc: 0.9 g/dL (ref 0.7–1.3)
Gamma Glob SerPl Elph-Mcnc: 0.6 g/dL (ref 0.4–1.8)
Globulin, Total: 2.2 g/dL (ref 2.2–3.9)
IgA: 34 mg/dL — ABNORMAL LOW (ref 61–437)
IgG (Immunoglobin G), Serum: 652 mg/dL (ref 603–1613)
IgM (Immunoglobulin M), Srm: 21 mg/dL (ref 20–172)
Total Protein ELP: 6.2 g/dL (ref 6.0–8.5)

## 2023-06-22 ENCOUNTER — Encounter: Payer: Self-pay | Admitting: Hematology

## 2023-06-22 MED ORDER — FENTANYL 25 MCG/HR TD PT72: 1.0000 | MEDICATED_PATCH | TRANSDERMAL | 0 refills | Status: DC

## 2023-06-23 ENCOUNTER — Other Ambulatory Visit: Payer: Self-pay

## 2023-06-23 DIAGNOSIS — H527 Unspecified disorder of refraction: Secondary | ICD-10-CM | POA: Diagnosis not present

## 2023-06-23 DIAGNOSIS — C9000 Multiple myeloma not having achieved remission: Secondary | ICD-10-CM

## 2023-06-23 MED ORDER — ESOMEPRAZOLE MAGNESIUM 40 MG PO CPDR: 40.0000 mg | DELAYED_RELEASE_CAPSULE | Freq: Every day | ORAL | 3 refills | Status: DC

## 2023-06-26 ENCOUNTER — Other Ambulatory Visit: Payer: Self-pay

## 2023-07-02 ENCOUNTER — Inpatient Hospital Stay (HOSPITAL_BASED_OUTPATIENT_CLINIC_OR_DEPARTMENT_OTHER): Payer: BC Managed Care – PPO | Admitting: Hematology

## 2023-07-02 ENCOUNTER — Inpatient Hospital Stay: Payer: BC Managed Care – PPO

## 2023-07-02 ENCOUNTER — Inpatient Hospital Stay: Payer: BC Managed Care – PPO | Attending: Physician Assistant

## 2023-07-02 VITALS — BP 154/89 | HR 59 | Temp 98.1°F | Resp 20 | Wt 190.3 lb

## 2023-07-02 VITALS — BP 143/79 | HR 70 | Resp 18

## 2023-07-02 DIAGNOSIS — Z808 Family history of malignant neoplasm of other organs or systems: Secondary | ICD-10-CM | POA: Insufficient documentation

## 2023-07-02 DIAGNOSIS — Z5112 Encounter for antineoplastic immunotherapy: Secondary | ICD-10-CM | POA: Diagnosis not present

## 2023-07-02 DIAGNOSIS — Z809 Family history of malignant neoplasm, unspecified: Secondary | ICD-10-CM | POA: Insufficient documentation

## 2023-07-02 DIAGNOSIS — Z5111 Encounter for antineoplastic chemotherapy: Secondary | ICD-10-CM

## 2023-07-02 DIAGNOSIS — R059 Cough, unspecified: Secondary | ICD-10-CM | POA: Insufficient documentation

## 2023-07-02 DIAGNOSIS — R0602 Shortness of breath: Secondary | ICD-10-CM | POA: Diagnosis not present

## 2023-07-02 DIAGNOSIS — D72819 Decreased white blood cell count, unspecified: Secondary | ICD-10-CM | POA: Diagnosis not present

## 2023-07-02 DIAGNOSIS — C9000 Multiple myeloma not having achieved remission: Secondary | ICD-10-CM

## 2023-07-02 DIAGNOSIS — Z87891 Personal history of nicotine dependence: Secondary | ICD-10-CM | POA: Insufficient documentation

## 2023-07-02 DIAGNOSIS — M545 Low back pain, unspecified: Secondary | ICD-10-CM | POA: Insufficient documentation

## 2023-07-02 DIAGNOSIS — Z7189 Other specified counseling: Secondary | ICD-10-CM

## 2023-07-02 DIAGNOSIS — M25511 Pain in right shoulder: Secondary | ICD-10-CM | POA: Insufficient documentation

## 2023-07-02 DIAGNOSIS — Z79891 Long term (current) use of opiate analgesic: Secondary | ICD-10-CM | POA: Diagnosis not present

## 2023-07-02 LAB — CBC WITH DIFFERENTIAL (CANCER CENTER ONLY)
Abs Immature Granulocytes: 0.01 10*3/uL (ref 0.00–0.07)
Basophils Absolute: 0 10*3/uL (ref 0.0–0.1)
Basophils Relative: 1 %
Eosinophils Absolute: 0.2 10*3/uL (ref 0.0–0.5)
Eosinophils Relative: 4 %
HCT: 43.2 % (ref 39.0–52.0)
Hemoglobin: 14.5 g/dL (ref 13.0–17.0)
Immature Granulocytes: 0 %
Lymphocytes Relative: 29 %
Lymphs Abs: 1 10*3/uL (ref 0.7–4.0)
MCH: 28.4 pg (ref 26.0–34.0)
MCHC: 33.6 g/dL (ref 30.0–36.0)
MCV: 84.7 fL (ref 80.0–100.0)
Monocytes Absolute: 0.3 10*3/uL (ref 0.1–1.0)
Monocytes Relative: 9 %
Neutro Abs: 2 10*3/uL (ref 1.7–7.7)
Neutrophils Relative %: 57 %
Platelet Count: 175 10*3/uL (ref 150–400)
RBC: 5.1 MIL/uL (ref 4.22–5.81)
RDW: 14.6 % (ref 11.5–15.5)
WBC Count: 3.5 10*3/uL — ABNORMAL LOW (ref 4.0–10.5)
nRBC: 0 % (ref 0.0–0.2)

## 2023-07-02 LAB — CMP (CANCER CENTER ONLY)
ALT: 12 U/L (ref 0–44)
AST: 14 U/L — ABNORMAL LOW (ref 15–41)
Albumin: 4.4 g/dL (ref 3.5–5.0)
Alkaline Phosphatase: 40 U/L (ref 38–126)
Anion gap: 4 — ABNORMAL LOW (ref 5–15)
BUN: 9 mg/dL (ref 8–23)
CO2: 27 mmol/L (ref 22–32)
Calcium: 9.1 mg/dL (ref 8.9–10.3)
Chloride: 105 mmol/L (ref 98–111)
Creatinine: 0.89 mg/dL (ref 0.61–1.24)
GFR, Estimated: >60 mL/min (ref >60)
Glucose, Bld: 105 mg/dL — ABNORMAL HIGH (ref 70–99)
Potassium: 3.7 mmol/L (ref 3.5–5.1)
Sodium: 136 mmol/L (ref 135–145)
Total Bilirubin: 1.1 mg/dL (ref <1.2)
Total Protein: 6.4 g/dL — ABNORMAL LOW (ref 6.5–8.1)

## 2023-07-02 MED ORDER — CARFILZOMIB CHEMO INJECTION 60 MG
70.0000 mg/m2 | Freq: Once | INTRAVENOUS | Status: AC
  Administered 2023-07-02: 140 mg via INTRAVENOUS
  Filled 2023-07-02: qty 60

## 2023-07-02 MED ORDER — SODIUM CHLORIDE 0.9 % IV SOLN
Freq: Once | INTRAVENOUS | Status: AC
Start: 2023-07-02 — End: 2023-07-02

## 2023-07-02 MED ORDER — PALONOSETRON HCL INJECTION 0.25 MG/5ML
0.2500 mg | Freq: Once | INTRAVENOUS | Status: AC
  Administered 2023-07-02: 0.25 mg via INTRAVENOUS
  Filled 2023-07-02: qty 5

## 2023-07-02 MED ORDER — ZOLEDRONIC ACID 4 MG/100ML IV SOLN
4.0000 mg | Freq: Once | INTRAVENOUS | Status: AC
Start: 2023-07-02 — End: 2023-07-02
  Administered 2023-07-02: 4 mg via INTRAVENOUS
  Filled 2023-07-02: qty 100

## 2023-07-02 MED ORDER — SODIUM CHLORIDE 0.9 % IV SOLN: Freq: Once | INTRAVENOUS | Status: AC

## 2023-07-02 MED ORDER — DEXAMETHASONE SODIUM PHOSPHATE 10 MG/ML IJ SOLN
8.0000 mg | Freq: Once | INTRAMUSCULAR | Status: AC
  Administered 2023-07-02: 8 mg via INTRAVENOUS
  Filled 2023-07-02: qty 1

## 2023-07-02 NOTE — Patient Instructions (Signed)
[facility] - A DEPT OF [facility]  Discharge Instructions: Thank you for choosing [facility] to provide your oncology and hematology care.   If you have a lab appointment with the Cancer Center, please go directly to the Cancer Center and check in at the registration area.   Wear comfortable clothing and clothing appropriate for easy access to any Portacath or PICC line.   We strive to give you quality time with your provider. You may need to reschedule your appointment if you arrive late (15 or more minutes).  Arriving late affects you and other patients whose appointments are after yours.  Also, if you miss three or more appointments without notifying the office, you may be dismissed from the clinic at the provider's discretion.      For prescription refill requests, have your pharmacy contact our office and allow 72 hours for refills to be completed.    Today you received the following chemotherapy and/or immunotherapy agents Kyprolis      To help prevent nausea and vomiting after your treatment, we encourage you to take your nausea medication as directed.  BELOW ARE SYMPTOMS THAT SHOULD BE REPORTED IMMEDIATELY: *FEVER GREATER THAN 100.4 F (38 C) OR HIGHER *CHILLS OR SWEATING *NAUSEA AND VOMITING THAT IS NOT CONTROLLED WITH YOUR NAUSEA MEDICATION *UNUSUAL SHORTNESS OF BREATH *UNUSUAL BRUISING OR BLEEDING *URINARY PROBLEMS (pain or burning when urinating, or frequent urination) *BOWEL PROBLEMS (unusual diarrhea, constipation, pain near the anus) TENDERNESS IN MOUTH AND THROAT WITH OR WITHOUT PRESENCE OF ULCERS (sore throat, sores in mouth, or a toothache) UNUSUAL RASH, SWELLING OR PAIN  UNUSUAL VAGINAL DISCHARGE OR ITCHING   Items with * indicate a potential emergency and should be followed up as soon as possible or go to the Emergency Department if any problems should occur.  Please show the CHEMOTHERAPY ALERT CARD or IMMUNOTHERAPY  ALERT CARD at check-in to the Emergency Department and triage nurse.  Should you have questions after your visit or need to cancel or reschedule your appointment, please contact [facility] - A DEPT OF [facility]  Dept: [phone]  and follow the prompts.  Office hours are 8:00 a.m. to 4:30 p.m. Monday - Friday. Please note that voicemails left after 4:00 p.m. may not be returned until the following business day.  We are closed weekends and major holidays. You have access to a nurse at all times for urgent questions. Please call the main number to the clinic Dept: [phone] and follow the prompts.   For any non-urgent questions, you may also contact your provider using MyChart. We now offer e-Visits for anyone 27 and older to request care online for non-urgent symptoms. For details visit [email or web address].   Also download the MyChart app! Go to the app store, search "MyChart", open the app, select [facility], and log in with your MyChart username and password.  Zoledronic Acid Injection (Cancer) What is this medication? ZOLEDRONIC ACID (ZOE le dron ik AS id) treats high calcium levels in the blood caused by cancer. It may also be used with chemotherapy to treat weakened bones caused by cancer. It works by slowing down the release of calcium from bones. This lowers calcium levels in your blood. It also makes your bones stronger and less likely to break (fracture). It belongs to a group of medications called bisphosphonates. This medicine may be used for other purposes; ask your health care provider or pharmacist if you  have questions. COMMON BRAND NAME(S): Zometa, Zometa Powder What should I tell my care team before I take this medication? They need to know if you have any of these conditions: Dehydration Dental disease Kidney disease Liver disease Low levels of calcium in the blood Lung or breathing disease, such as asthma Receiving steroids, such  as dexamethasone or prednisone An unusual or allergic reaction to zoledronic acid, other medications, foods, dyes, or preservatives Pregnant or trying to get pregnant Breast-feeding How should I use this medication? This medication is injected into a vein. It is given by your care team in a hospital or clinic setting. Talk to your care team about the use of this medication in children. Special care may be needed. Overdosage: If you think you have taken too much of this medicine contact a poison control center or emergency room at once. NOTE: This medicine is only for you. Do not share this medicine with others. What if I miss a dose? Keep appointments for follow-up doses. It is important not to miss your dose. Call your care team if you are unable to keep an appointment. What may interact with this medication? Certain antibiotics given by injection Diuretics, such as bumetanide, furosemide NSAIDs, medications for pain and inflammation, such as ibuprofen or naproxen Teriparatide Thalidomide This list may not describe all possible interactions. Give your health care provider a list of all the medicines, herbs, non-prescription drugs, or dietary supplements you use. Also tell them if you smoke, drink alcohol, or use illegal drugs. Some items may interact with your medicine. What should I watch for while using this medication? Visit your care team for regular checks on your progress. It may be some time before you see the benefit from this medication. Some people who take this medication have severe bone, joint, or muscle pain. This medication may also increase your risk for jaw problems or a broken thigh bone. Tell your care team right away if you have severe pain in your jaw, bones, joints, or muscles. Tell you care team if you have any pain that does not go away or that gets worse. Tell your dentist and dental surgeon that you are taking this medication. You should not have major dental surgery  while on this medication. See your dentist to have a dental exam and fix any dental problems before starting this medication. Take good care of your teeth while on this medication. Make sure you see your dentist for regular follow-up appointments. You should make sure you get enough calcium and vitamin D while you are taking this medication. Discuss the foods you eat and the vitamins you take with your care team. Check with your care team if you have severe diarrhea, nausea, and vomiting, or if you sweat a lot. The loss of too much body fluid may make it dangerous for you to take this medication. You may need bloodwork while taking this medication. Talk to your care team if you wish to become pregnant or think you might be pregnant. This medication can cause serious birth defects. What side effects may I notice from receiving this medication? Side effects that you should report to your care team as soon as possible: Allergic reactions--skin rash, itching, hives, swelling of the face, lips, tongue, or throat Kidney injury--decrease in the amount of urine, swelling of the ankles, hands, or feet Low calcium level--muscle pain or cramps, confusion, tingling, or numbness in the hands or feet Osteonecrosis of the jaw--pain, swelling, or redness in the mouth, numbness  of the jaw, poor healing after dental work, unusual discharge from the mouth, visible bones in the mouth Severe bone, joint, or muscle pain Side effects that usually do not require medical attention (report to your care team if they continue or are bothersome): Constipation Fatigue Fever Loss of appetite Nausea Stomach pain This list may not describe all possible side effects. Call your doctor for medical advice about side effects. You may report side effects to FDA at 1-800-FDA-1088. Where should I keep my medication? This medication is given in a hospital or clinic. It will not be stored at home. NOTE: This sheet is a summary. It may  not cover all possible information. If you have questions about this medicine, talk to your doctor, pharmacist, or health care provider.  2024 Elsevier/Gold Standard (2021-09-28 00:00:00)

## 2023-07-02 NOTE — Progress Notes (Signed)
HEMATOLOGY/ONCOLOGY CLINIC NOTE  Date of Service: 07/02/23   Patient Care Team: Milus Height, [place] as PCP - General (Nurse Practitioner)  CHIEF COMPLAINTS/PURPOSE OF CONSULTATION:  Follow-up for continued evaluation and management of multiple myeloma  HISTORY OF PRESENTING ILLNESS:   Anthony [name] is a wonderful 62 y.o. male who has been referred to [place] by Dr Milus Height, PA for evaluation and management of newly diagnosed multiple myeloma. He reports He is doing well.  He reports persistent lower back pain that he has had since he was in his 20's. He notes no previous significant injuries. He further notes that he gets chiropractic adjustments and takes Tylenol to manage his symptoms. He notes less back pain when standing up on his right leg and maintaining weight on his right leg.  He reports previous history of smoking and chewing tobacco over 40 years ago.  He had a recent fall back in February of this year with only a pulled muscle. And he notes another fall when chasing his cat where he fell on his right shoulder. He reports pain in right shoulder.   He had a recent COVID-19 infection back in March.  He reports intermittent cough with SOB. He notes he takes 2 Rolaids at night. We discussed potentially trying antacids which he is agreeable to as he will begin steroids soon and was advised of the possible symptoms and side effects from taking steroids.  We discussed CRAB criteria and that he meets at least two of the criterion being  anemia and bone disease.  We discussed getting additional scans for further evaluation which he is agreeable to.  We further discussed starting Daratumumab/Velcade/Dexamethasone/Zometa and Revlimid for treatment which he is agreeable to. We also discussed starting steroids before treatment and taking an acid suppressant which he was also agreeable to.  We discussed getting Senna and taking it as needed for constipation.  Labs done today  were reviewed in detail.  We discussed his recent bone marrow biopsy and aspiration done 02/06/2022.  We discussed PET/CT scan done 01/28/2022.  We discussed CT right shoulder w/o contrast done 11/13/2021  INTERVAL HISTORY:  Anthony [name] is a 62 y.o. male, is here for continued evaluation of and management of multiple myeloma. Patient is here for toxicity check prior to receiving cycle 16 day 15 of his Carfilzomib/Revimid/Dexamethasone treatment.  Patient was last seen by me on 06/04/2023 and reported hand soreness and a few episodes of gasping for air.   Today, he reports no new concerns since his last visit.   Patient denies any infection issues or abdominal pain. He reports mild leg swelling.   He denies any need for any medication refills.   He reports minor scratches in his arms due to engaging in activities including washing his car and yard work.   He reports stable fatigue overall, and notes that he particularly feels better today, compared to the last few weeks.   His weight in clinic today is 190 and report goal weight of 180.   He reports a history of having a local reaction involving a rash to Velcade shot previously.   By reports plans to travel by car for a few hours for the holidays, but denies any major travelling plans.   MEDICAL HISTORY:  Past Medical History:  Diagnosis Date   Back pain    Cancer (HCC)    Multiple myeloma (HCC) 02/15/2022    SURGICAL HISTORY: Past Surgical History:  Procedure Laterality Date  IR BONE TUMOR(S)RF ABLATION  05/07/2022   IR BONE TUMOR(S)RF ABLATION  05/07/2022   IR BONE TUMOR(S)RF ABLATION  05/07/2022   IR KYPHO EA ADDL LEVEL THORACIC OR LUMBAR  05/07/2022   IR KYPHO LUMBAR INC FX REDUCE BONE BX UNI/BIL CANNULATION INC/IMAGING  05/07/2022   IR KYPHO THORACIC WITH BONE BIOPSY  05/07/2022   IR RADIOLOGIST EVAL & MGMT  04/23/2022   IR RADIOLOGIST EVAL & MGMT  05/17/2022   IR RADIOLOGIST EVAL & MGMT  06/06/2022    SOCIAL  HISTORY: Social History   Socioeconomic History   Marital status: Married    Spouse name: Not on file   Number of children: Not on file   Years of education: Not on file   Highest education level: Not on file  Occupational History   Not on file  Tobacco Use   Smoking status: Never   Smokeless tobacco: Former    Types: Chew, Snuff  Vaping Use   Vaping status: Never Used  Substance and Sexual Activity   Alcohol use: Yes    Alcohol/week: 2.0 standard drinks of alcohol    Types: 2 Glasses of wine per week   Drug use: No   Sexual activity: Yes  Other Topics Concern   Not on file  Social History Narrative   Not on file   Social Determinants of Health   Financial Resource Strain: Low Risk  (05/07/2022)   Overall Financial Resource Strain (CARDIA)    Difficulty of Paying Living Expenses: Not very hard  Food Insecurity: No Food Insecurity (05/07/2022)   Hunger Vital Sign    Worried About Running Out of Food in the Last Year: Never true    Ran Out of Food in the Last Year: Never true  Transportation Needs: No Transportation Needs (05/07/2022)   PRAPARE - Administrator, Civil Service (Medical): No    Lack of Transportation (Non-Medical): No  Physical Activity: Not on file  Stress: Not on file  Social Connections: Not on file  Intimate Partner Violence: Unknown (05/07/2022)   Humiliation, Afraid, Rape, and Kick questionnaire    Fear of Current or Ex-Partner: No    Emotionally Abused: No    Physically Abused: No    Sexually Abused: Not on file    FAMILY HISTORY: Family History  Problem Relation Age of Onset   Rheum arthritis Mother    Other Sister        Pre-cancerous uterine mass;    Rheum arthritis Sister    Bone cancer Paternal Grandfather    Brain cancer Maternal Uncle    Brain cancer Maternal Aunt    Osteoporosis Neg Hx     ALLERGIES:  has No Known Allergies.  MEDICATIONS:  Current Outpatient Medications  Medication Sig Dispense Refill   acyclovir  (ZOVIRAX) 400 MG tablet Take 1 tablet (400 mg total) by mouth 2 (two) times daily. 60 tablet 11   aluminum chloride (DRYSOL) 20 % external solution Apply 1 application topically at bedtime.     calcium-vitamin D (OSCAL WITH D) 250-125 MG-UNIT tablet Take 1 tablet by mouth daily.     cyanocobalamin (VITAMIN B12) 1000 MCG tablet Take 1 tablet (1,000 mcg total) by mouth daily. 30 tablet 3   cyclobenzaprine (FLEXERIL) 10 MG tablet Take 10 mg by mouth 3 (three) times daily as needed for muscle spasms.     diclofenac sodium (VOLTAREN) 1 % GEL Apply 2 g topically as needed.     dronabinol (MARINOL) 10 MG capsule  Take 1 capsule (10 mg total) by mouth 2 (two) times daily before a meal. 60 capsule 0   ELIQUIS 5 MG TABS tablet Take 1 tablet (5 mg total) by mouth 2 (two) times daily. 60 tablet 5   ergocalciferol (VITAMIN D2) 1.25 MG (50000 UT) capsule Take 1 capsule (50,000 Units total) by mouth 2 (two) times a week. 12 capsule 2   esomeprazole (NEXIUM) 40 MG capsule Take 1 capsule (40 mg total) by mouth daily. Take 1 capsule (40 mg total) daily before breakfast 30 capsule 3   fentaNYL (DURAGESIC) 25 MCG/HR Place 1 patch onto the skin every 3 (three) days. 10 patch 0   furosemide (LASIX) 20 MG tablet Take 1 tablet (20 mg total) by mouth daily. 30 tablet 1   lidocaine (LIDODERM) 5 % Place 1 patch onto the skin daily. Remove & Discard patch within 12 hours or as directed by MD 30 patch 0   LORazepam (ATIVAN) 0.5 MG tablet Take 1 tablet (0.5 mg total) by mouth every 6 (six) hours as needed (Nausea or vomiting). 30 tablet 0   methocarbamol (ROBAXIN) 500 MG tablet Take 1 tablet (500 mg total) by mouth every 8 (eight) hours as needed for muscle spasms. 30 tablet 0   methylPREDNISolone (MEDROL DOSEPAK) 4 MG TBPK tablet Take 6 pills by mouth day 1, 5 on day 2, 4 on day 3, 3 on day 4, 2 on day 5, 1 on day 6 21 tablet 0   Multiple Vitamin (MULTIVITAMIN) tablet Take 1 tablet by mouth daily.     Omega-3 Fatty Acids (FISH  OIL) 1360 MG CAPS Take 1 capsule by mouth daily.     ondansetron (ZOFRAN) 8 MG tablet Take 1 tablet (8 mg total) by mouth every 8 (eight) hours as needed for nausea or vomiting. 30 tablet 0   oxyCODONE (ROXICODONE) 5 MG immediate release tablet Take 1 tablet (5 mg total) by mouth every 4 (four) hours as needed for severe pain or moderate pain. Take 1 to 2 tablets every 4 hours as needed for moderate to severe pain. 60 tablet 0   prochlorperazine (COMPAZINE) 10 MG tablet TAKE ONE TABLET BY MOUTH EVERY 6 HOURS AS NEEDED FOR NAUSEA AND VOMITING 30 tablet 1   REVLIMID 15 MG capsule TAKE 1 CAPSULE DAILY FOR 21 DAYS ON, THEN 7 DAYS OFF 21 capsule 0   senna-docusate (SENNA S) 8.6-50 MG tablet Take 2 tablets by mouth at bedtime. 60 tablet 1   terbinafine (LAMISIL) 1 % cream Apply 1 application topically 2 (two) times daily.     No current facility-administered medications for this visit.   REVIEW OF SYSTEMS:  10 Point review of Systems was done is negative except as noted above.   PHYSICAL EXAMINATION: .BP (!) 154/89   Pulse (!) 59   Temp 98.1 F (36.7 C)   Resp 20   Wt 190 lb 4.8 oz (86.3 kg)   SpO2 97%   BMI 29.81 kg/m   GENERAL:alert, in no acute distress and comfortable SKIN: no acute rashes, no significant lesions EYES: conjunctiva are pink and non-injected, sclera anicteric OROPHARYNX: MMM, no exudates, no oropharyngeal erythema or ulceration NECK: supple, no JVD LYMPH:  no palpable lymphadenopathy in the cervical, axillary or inguinal regions LUNGS: clear to auscultation b/l with normal respiratory effort HEART: regular rate & rhythm ABDOMEN:  normoactive bowel sounds , non tender, not distended. Extremity: no pedal edema PSYCH: alert & oriented x 3 with fluent speech NEURO: no focal motor/sensory  deficits   LABORATORY DATA:  I have reviewed the data as listed .    Latest Ref Rng & Units 07/02/2023    9:21 AM 06/17/2023    9:39 AM 06/04/2023   10:24 AM  CBC  WBC 4.0 -  10.5 K/uL 3.5  3.9  3.1   Hemoglobin 13.0 - 17.0 g/dL 16.1  09.6  04.5   Hematocrit 39.0 - 52.0 % 43.2  43.7  45.1   Platelets 150 - 400 K/uL 175  161  215       Latest Ref Rng & Units 07/02/2023    9:21 AM 06/17/2023    9:39 AM 06/04/2023   10:24 AM  CMP  Glucose 70 - 99 mg/dL 409  99  77   BUN 8 - 23 mg/dL 9  11  10    Creatinine 0.61 - 1.24 mg/dL 8.11  9.14  7.82   Sodium 135 - 145 mmol/L 136  138  138   Potassium 3.5 - 5.1 mmol/L 3.7  4.0  4.1   Chloride 98 - 111 mmol/L 105  106  105   CO2 22 - 32 mmol/L 27  24  23    Calcium 8.9 - 10.3 mg/dL 9.1  9.0  9.2   Total Protein 6.5 - 8.1 g/dL 6.4  6.4  6.8   Total Bilirubin <1.2 mg/dL 1.1  0.9  0.9   Alkaline Phos 38 - 126 U/L 40  48  46   AST 15 - 41 U/L 14  14  18    ALT 0 - 44 U/L 12  15  17       PATHOLOGY Surgical Pathology CASE: [id number] PATIENT: Nuri Labrum Bone Marrow Report     Clinical History: Multiple Myeloma     DIAGNOSIS:  BONE MARROW, ASPIRATE, CLOT, CORE: -Hypercellular bone marrow (60%) with erythroid predominant trilineage hematopoiesis and no evidence of residual plasma cell neoplasm (less than 1% plasma cells by manual aspirate differential, and CD138 immunohistochemical analysis of clot and core sections).  PERIPHERAL BLOOD: -Leukopenia  MICROSCOPIC DESCRIPTION:  PERIPHERAL BLOOD SMEAR: Platelets: Adequate in number, no platelet clumps identified Erythroid: Normocytic normochromic red blood cells Leukocytes: Mild leukopenia, negative for dysplastic granulocytes, blasts or  plasma cells  BONE MARROW ASPIRATE: Cellular Erythroid precursors: Relatively increased, show a full sequence of maturation with occasional abnormal forms (including binucleate forms, megaloblastoid change and nuclear membrane irregularities) Granulocytic precursors: Decreased, show a full sequence of generally orderly maturation Megakaryocytes: Normal in number and morphology Lymphocytes/plasma cells: Not  increased   02/06/2022 Molecular pathology   RADIOGRAPHIC STUDIES: I have personally reviewed the radiological images as listed and agreed with the findings in the report. No results found.   ASSESSMENT & PLAN:   62 y.o. very pleasant male with  1. R-ISS stage III multiple myeloma with extensive bone metastases and patholgiic fracture rt shoulder -Recent bone marrow biopsy and aspiration done 02/06/2022 revealed hypercellular bone marrow with plasma cell neoplasm. Cytogenetics-normal karyotype Molecular cytogenetics-TP53 deletion and duplication of 1 q.Marland Kitchen  PLAN:  -Discussed lab results on 07/02/23 in detail with patient. CBC stable, showed WBC of 3.5K, hemoglobin of 14.5, and platelets of 175K. -CMP stable -Myeloma panel from 06/17/2023 continued to suggest that patient was in remission -no clinical sign or evidence of multiple myeloma progression at this time -continue maintenance Revlimid treatment and maintenance Carfilzomib -continue 1,000 mcg daily vitamin B12  -continue B complex supplements -continue vitamin D once a week -answered all of patient's questions in detail  FOLLOW-UP:  Per integrated scheduling MD visit in 4 weeks  The total time spent in the appointment was 30 minutes* .  All of the patient's questions were answered with apparent satisfaction. The patient knows to call the clinic with any problems, questions or concerns.   Wyvonnia Lora MD MS AAHIVMS [facility] Hematology/Oncology Physician [facility]  .*Total Encounter Time as defined by the Centers for Medicare and Medicaid Services includes, in addition to the face-to-face time of a patient visit (documented in the note above) non-face-to-face time: obtaining and reviewing outside history, ordering and reviewing medications, tests or procedures, care coordination (communications with other health care professionals or caregivers) and documentation in the medical record.    I,Mitra  Faeizi,acting as a Neurosurgeon for Wyvonnia Lora, MD.,have documented all relevant documentation on the behalf of Wyvonnia Lora, MD,as directed by  Wyvonnia Lora, MD while in the presence of Wyvonnia Lora, MD.  .I have reviewed the above documentation for accuracy and completeness, and I agree with the above. Johney Maine MD

## 2023-07-04 ENCOUNTER — Other Ambulatory Visit: Payer: Self-pay

## 2023-07-08 ENCOUNTER — Encounter: Payer: Self-pay | Admitting: Hematology

## 2023-07-11 ENCOUNTER — Other Ambulatory Visit: Payer: Self-pay

## 2023-07-11 DIAGNOSIS — C9000 Multiple myeloma not having achieved remission: Secondary | ICD-10-CM

## 2023-07-14 ENCOUNTER — Other Ambulatory Visit: Payer: Self-pay

## 2023-07-14 DIAGNOSIS — C9000 Multiple myeloma not having achieved remission: Secondary | ICD-10-CM

## 2023-07-14 MED ORDER — REVLIMID 15 MG PO CAPS: ORAL_CAPSULE | ORAL | 0 refills | Status: DC

## 2023-07-15 ENCOUNTER — Encounter: Payer: Self-pay | Admitting: Hematology

## 2023-07-15 MED ORDER — OXYCODONE HCL 5 MG PO TABS: 5.0000 mg | ORAL_TABLET | ORAL | 0 refills | Status: AC | PRN

## 2023-07-16 ENCOUNTER — Inpatient Hospital Stay: Payer: BC Managed Care – PPO

## 2023-07-16 VITALS — BP 148/93 | HR 68 | Temp 98.3°F | Resp 16 | Wt 186.0 lb

## 2023-07-16 DIAGNOSIS — M545 Low back pain, unspecified: Secondary | ICD-10-CM | POA: Diagnosis not present

## 2023-07-16 DIAGNOSIS — C9000 Multiple myeloma not having achieved remission: Secondary | ICD-10-CM

## 2023-07-16 DIAGNOSIS — Z7189 Other specified counseling: Secondary | ICD-10-CM

## 2023-07-16 DIAGNOSIS — Z87891 Personal history of nicotine dependence: Secondary | ICD-10-CM | POA: Diagnosis not present

## 2023-07-16 DIAGNOSIS — R059 Cough, unspecified: Secondary | ICD-10-CM | POA: Diagnosis not present

## 2023-07-16 DIAGNOSIS — M25511 Pain in right shoulder: Secondary | ICD-10-CM | POA: Diagnosis not present

## 2023-07-16 DIAGNOSIS — Z809 Family history of malignant neoplasm, unspecified: Secondary | ICD-10-CM | POA: Diagnosis not present

## 2023-07-16 DIAGNOSIS — D72819 Decreased white blood cell count, unspecified: Secondary | ICD-10-CM | POA: Diagnosis not present

## 2023-07-16 DIAGNOSIS — R0602 Shortness of breath: Secondary | ICD-10-CM | POA: Diagnosis not present

## 2023-07-16 DIAGNOSIS — Z808 Family history of malignant neoplasm of other organs or systems: Secondary | ICD-10-CM | POA: Diagnosis not present

## 2023-07-16 DIAGNOSIS — Z5112 Encounter for antineoplastic immunotherapy: Secondary | ICD-10-CM | POA: Diagnosis not present

## 2023-07-16 DIAGNOSIS — Z79891 Long term (current) use of opiate analgesic: Secondary | ICD-10-CM | POA: Diagnosis not present

## 2023-07-16 LAB — CBC WITH DIFFERENTIAL (CANCER CENTER ONLY)
Abs Immature Granulocytes: 0.01 10*3/uL (ref 0.00–0.07)
Basophils Absolute: 0 10*3/uL (ref 0.0–0.1)
Basophils Relative: 1 %
Eosinophils Absolute: 0.1 10*3/uL (ref 0.0–0.5)
Eosinophils Relative: 3 %
HCT: 44.2 % (ref 39.0–52.0)
Hemoglobin: 14.9 g/dL (ref 13.0–17.0)
Immature Granulocytes: 0 %
Lymphocytes Relative: 22 %
Lymphs Abs: 0.8 10*3/uL (ref 0.7–4.0)
MCH: 28.8 pg (ref 26.0–34.0)
MCHC: 33.7 g/dL (ref 30.0–36.0)
MCV: 85.3 fL (ref 80.0–100.0)
Monocytes Absolute: 0.6 10*3/uL (ref 0.1–1.0)
Monocytes Relative: 16 %
Neutro Abs: 2.1 10*3/uL (ref 1.7–7.7)
Neutrophils Relative %: 58 %
Platelet Count: 153 10*3/uL (ref 150–400)
RBC: 5.18 MIL/uL (ref 4.22–5.81)
RDW: 14.6 % (ref 11.5–15.5)
WBC Count: 3.7 10*3/uL — ABNORMAL LOW (ref 4.0–10.5)
nRBC: 0 % (ref 0.0–0.2)

## 2023-07-16 LAB — CMP (CANCER CENTER ONLY)
ALT: 15 U/L (ref 0–44)
AST: 12 U/L — ABNORMAL LOW (ref 15–41)
Albumin: 4.2 g/dL (ref 3.5–5.0)
Alkaline Phosphatase: 37 U/L — ABNORMAL LOW (ref 38–126)
Anion gap: 6 (ref 5–15)
BUN: 12 mg/dL (ref 8–23)
CO2: 25 mmol/L (ref 22–32)
Calcium: 8.9 mg/dL (ref 8.9–10.3)
Chloride: 105 mmol/L (ref 98–111)
Creatinine: 0.79 mg/dL (ref 0.61–1.24)
GFR, Estimated: >60 mL/min (ref >60)
Glucose, Bld: 90 mg/dL (ref 70–99)
Potassium: 3.6 mmol/L (ref 3.5–5.1)
Sodium: 136 mmol/L (ref 135–145)
Total Bilirubin: 0.8 mg/dL (ref <1.2)
Total Protein: 6.3 g/dL — ABNORMAL LOW (ref 6.5–8.1)

## 2023-07-16 MED ORDER — PALONOSETRON HCL INJECTION 0.25 MG/5ML
0.2500 mg | Freq: Once | INTRAVENOUS | Status: AC
  Administered 2023-07-16: 0.25 mg via INTRAVENOUS
  Filled 2023-07-16: qty 5

## 2023-07-16 MED ORDER — DEXAMETHASONE SODIUM PHOSPHATE 10 MG/ML IJ SOLN
8.0000 mg | Freq: Once | INTRAMUSCULAR | Status: AC
Start: 2023-07-16 — End: 2023-07-16
  Administered 2023-07-16: 8 mg via INTRAVENOUS
  Filled 2023-07-16: qty 1

## 2023-07-16 MED ORDER — DEXTROSE 5 % IV SOLN
70.0000 mg/m2 | Freq: Once | INTRAVENOUS | Status: AC
  Administered 2023-07-16: 140 mg via INTRAVENOUS
  Filled 2023-07-16: qty 60

## 2023-07-16 MED ORDER — SODIUM CHLORIDE 0.9 % IV SOLN: Freq: Once | INTRAVENOUS | Status: AC

## 2023-07-16 MED ORDER — ACETAMINOPHEN 325 MG PO TABS
650.0000 mg | ORAL_TABLET | Freq: Once | ORAL | Status: AC
  Administered 2023-07-16: 650 mg via ORAL
  Filled 2023-07-16: qty 2

## 2023-07-16 NOTE — Patient Instructions (Signed)
 [facility]  Discharge Instructions: Thank you for choosing [facility] to provide your oncology and hematology care.   If you have a lab appointment with the Cancer Center, please go directly to the Cancer Center and check in at the registration area.   Wear comfortable clothing and clothing appropriate for easy access to any Portacath or PICC line.   We strive to give you quality time with your provider. You may need to reschedule your appointment if you arrive late (15 or more minutes).  Arriving late affects you and other patients whose appointments are after yours.  Also, if you miss three or more appointments without notifying the office, you may be dismissed from the clinic at the provider's discretion.      For prescription refill requests, have your pharmacy contact our office and allow 72 hours for refills to be completed.    Today you received the following chemotherapy and/or immunotherapy agents Kyprolis      To help prevent nausea and vomiting after your treatment, we encourage you to take your nausea medication as directed.  BELOW ARE SYMPTOMS THAT SHOULD BE REPORTED IMMEDIATELY: *FEVER GREATER THAN 100.4 F (38 C) OR HIGHER *CHILLS OR SWEATING *NAUSEA AND VOMITING THAT IS NOT CONTROLLED WITH YOUR NAUSEA MEDICATION *UNUSUAL SHORTNESS OF BREATH *UNUSUAL BRUISING OR BLEEDING *URINARY PROBLEMS (pain or burning when urinating, or frequent urination) *BOWEL PROBLEMS (unusual diarrhea, constipation, pain near the anus) TENDERNESS IN MOUTH AND THROAT WITH OR WITHOUT PRESENCE OF ULCERS (sore throat, sores in mouth, or a toothache) UNUSUAL RASH, SWELLING OR PAIN  UNUSUAL VAGINAL DISCHARGE OR ITCHING   Items with * indicate a potential emergency and should be followed up as soon as possible or go to the Emergency Department if any problems should occur.  Please show the CHEMOTHERAPY ALERT CARD or IMMUNOTHERAPY  ALERT CARD at check-in to the Emergency Department and triage nurse.  Should you have questions after your visit or need to cancel or reschedule your appointment, please contact [facility] - A DEPT OF [facility]  Dept: [phone]  and follow the prompts.  Office hours are 8:00 a.m. to 4:30 p.m. Monday - Friday. Please note that voicemails left after 4:00 p.m. may not be returned until the following business day.  We are closed weekends and major holidays. You have access to a nurse at all times for urgent questions. Please call the main number to the clinic Dept: [phone] and follow the prompts.   For any non-urgent questions, you may also contact your provider using MyChart. We now offer e-Visits for anyone 27 and older to request care online for non-urgent symptoms. For details visit [email or web address].   Also download the MyChart app! Go to the app store, search "MyChart", open the app, select [place], and log in with your MyChart username and password.  Zoledronic Acid Injection (Cancer) What is this medication? ZOLEDRONIC ACID (ZOE le dron ik AS id) treats high calcium levels in the blood caused by cancer. It may also be used with chemotherapy to treat weakened bones caused by cancer. It works by slowing down the release of calcium from bones. This lowers calcium levels in your blood. It also makes your bones stronger and less likely to break (fracture). It belongs to a group of medications called bisphosphonates. This medicine may be used for other purposes; ask your health care provider or pharmacist if you  have questions. COMMON BRAND NAME(S): Zometa, Zometa Powder What should I tell my care team before I take this medication? They need to know if you have any of these conditions: Dehydration Dental disease Kidney disease Liver disease Low levels of calcium in the blood Lung or breathing disease, such as asthma Receiving steroids, such  as dexamethasone or prednisone An unusual or allergic reaction to zoledronic acid, other medications, foods, dyes, or preservatives Pregnant or trying to get pregnant Breast-feeding How should I use this medication? This medication is injected into a vein. It is given by your care team in a hospital or clinic setting. Talk to your care team about the use of this medication in children. Special care may be needed. Overdosage: If you think you have taken too much of this medicine contact a poison control center or emergency room at once. NOTE: This medicine is only for you. Do not share this medicine with others. What if I miss a dose? Keep appointments for follow-up doses. It is important not to miss your dose. Call your care team if you are unable to keep an appointment. What may interact with this medication? Certain antibiotics given by injection Diuretics, such as bumetanide, furosemide NSAIDs, medications for pain and inflammation, such as ibuprofen or naproxen Teriparatide Thalidomide This list may not describe all possible interactions. Give your health care provider a list of all the medicines, herbs, non-prescription drugs, or dietary supplements you use. Also tell them if you smoke, drink alcohol, or use illegal drugs. Some items may interact with your medicine. What should I watch for while using this medication? Visit your care team for regular checks on your progress. It may be some time before you see the benefit from this medication. Some people who take this medication have severe bone, joint, or muscle pain. This medication may also increase your risk for jaw problems or a broken thigh bone. Tell your care team right away if you have severe pain in your jaw, bones, joints, or muscles. Tell you care team if you have any pain that does not go away or that gets worse. Tell your dentist and dental surgeon that you are taking this medication. You should not have major dental surgery  while on this medication. See your dentist to have a dental exam and fix any dental problems before starting this medication. Take good care of your teeth while on this medication. Make sure you see your dentist for regular follow-up appointments. You should make sure you get enough calcium and vitamin D while you are taking this medication. Discuss the foods you eat and the vitamins you take with your care team. Check with your care team if you have severe diarrhea, nausea, and vomiting, or if you sweat a lot. The loss of too much body fluid may make it dangerous for you to take this medication. You may need bloodwork while taking this medication. Talk to your care team if you wish to become pregnant or think you might be pregnant. This medication can cause serious birth defects. What side effects may I notice from receiving this medication? Side effects that you should report to your care team as soon as possible: Allergic reactions--skin rash, itching, hives, swelling of the face, lips, tongue, or throat Kidney injury--decrease in the amount of urine, swelling of the ankles, hands, or feet Low calcium level--muscle pain or cramps, confusion, tingling, or numbness in the hands or feet Osteonecrosis of the jaw--pain, swelling, or redness in the mouth, numbness  of the jaw, poor healing after dental work, unusual discharge from the mouth, visible bones in the mouth Severe bone, joint, or muscle pain Side effects that usually do not require medical attention (report to your care team if they continue or are bothersome): Constipation Fatigue Fever Loss of appetite Nausea Stomach pain This list may not describe all possible side effects. Call your doctor for medical advice about side effects. You may report side effects to FDA at 1-800-FDA-1088. Where should I keep my medication? This medication is given in a hospital or clinic. It will not be stored at home. NOTE: This sheet is a summary. It may  not cover all possible information. If you have questions about this medicine, talk to your doctor, pharmacist, or health care provider.  2024 Elsevier/Gold Standard (2021-09-28 00:00:00)

## 2023-07-18 ENCOUNTER — Other Ambulatory Visit: Payer: Self-pay

## 2023-07-18 DIAGNOSIS — C9000 Multiple myeloma not having achieved remission: Secondary | ICD-10-CM

## 2023-07-18 NOTE — Telephone Encounter (Signed)
Mr. Garr called to let Vanessa Kick know his fentanyl patch needs to be refilled to [facility]. He did get the message from [facility] about his revlimid being refilled and he will call them if it does not come in soon.  Forwarding message to [facility] and Dr. Candise Che for refill. Refill entered for Dr. Candise Che to sign. Lorayne Marek, RN

## 2023-07-20 ENCOUNTER — Encounter: Payer: Self-pay | Admitting: Hematology

## 2023-07-20 MED ORDER — FENTANYL 25 MCG/HR TD PT72: 1.0000 | MEDICATED_PATCH | TRANSDERMAL | 0 refills | Status: DC

## 2023-07-21 ENCOUNTER — Other Ambulatory Visit: Payer: Self-pay

## 2023-07-21 DIAGNOSIS — C9000 Multiple myeloma not having achieved remission: Secondary | ICD-10-CM

## 2023-07-21 DIAGNOSIS — Z7189 Other specified counseling: Secondary | ICD-10-CM

## 2023-07-21 MED ORDER — LORAZEPAM 0.5 MG PO TABS: 0.5000 mg | ORAL_TABLET | Freq: Four times a day (QID) | ORAL | 0 refills | Status: DC | PRN

## 2023-07-24 LAB — MULTIPLE MYELOMA PANEL, SERUM
Albumin SerPl Elph-Mcnc: 3.8 g/dL (ref 2.9–4.4)
Albumin/Glob SerPl: 2 — ABNORMAL HIGH (ref 0.7–1.7)
Alpha 1: 0.2 g/dL (ref 0.0–0.4)
Alpha2 Glob SerPl Elph-Mcnc: 0.5 g/dL (ref 0.4–1.0)
B-Globulin SerPl Elph-Mcnc: 0.8 g/dL (ref 0.7–1.3)
Gamma Glob SerPl Elph-Mcnc: 0.5 g/dL (ref 0.4–1.8)
Globulin, Total: 2 g/dL — ABNORMAL LOW (ref 2.2–3.9)
IgA: 26 mg/dL — ABNORMAL LOW (ref 61–437)
IgG (Immunoglobin G), Serum: 591 mg/dL — ABNORMAL LOW (ref 603–1613)
IgM (Immunoglobulin M), Srm: 16 mg/dL — ABNORMAL LOW (ref 20–172)
Total Protein ELP: 5.8 g/dL — ABNORMAL LOW (ref 6.0–8.5)

## 2023-07-30 ENCOUNTER — Inpatient Hospital Stay: Payer: No Typology Code available for payment source

## 2023-07-30 ENCOUNTER — Inpatient Hospital Stay: Payer: BC Managed Care – PPO | Attending: Physician Assistant

## 2023-07-30 ENCOUNTER — Inpatient Hospital Stay (HOSPITAL_BASED_OUTPATIENT_CLINIC_OR_DEPARTMENT_OTHER): Payer: BC Managed Care – PPO | Admitting: Hematology

## 2023-07-30 ENCOUNTER — Inpatient Hospital Stay: Payer: No Typology Code available for payment source | Admitting: Dietician

## 2023-07-30 VITALS — BP 144/77 | HR 101 | Resp 16

## 2023-07-30 VITALS — BP 135/85 | HR 77 | Temp 98.1°F | Resp 20 | Wt 180.6 lb

## 2023-07-30 DIAGNOSIS — G4733 Obstructive sleep apnea (adult) (pediatric): Secondary | ICD-10-CM | POA: Diagnosis not present

## 2023-07-30 DIAGNOSIS — Z809 Family history of malignant neoplasm, unspecified: Secondary | ICD-10-CM | POA: Insufficient documentation

## 2023-07-30 DIAGNOSIS — C9000 Multiple myeloma not having achieved remission: Secondary | ICD-10-CM

## 2023-07-30 DIAGNOSIS — M545 Low back pain, unspecified: Secondary | ICD-10-CM | POA: Insufficient documentation

## 2023-07-30 DIAGNOSIS — Z5111 Encounter for antineoplastic chemotherapy: Secondary | ICD-10-CM | POA: Diagnosis not present

## 2023-07-30 DIAGNOSIS — Z5112 Encounter for antineoplastic immunotherapy: Secondary | ICD-10-CM | POA: Diagnosis not present

## 2023-07-30 DIAGNOSIS — D72819 Decreased white blood cell count, unspecified: Secondary | ICD-10-CM | POA: Diagnosis not present

## 2023-07-30 DIAGNOSIS — Z87891 Personal history of nicotine dependence: Secondary | ICD-10-CM | POA: Insufficient documentation

## 2023-07-30 DIAGNOSIS — R0602 Shortness of breath: Secondary | ICD-10-CM | POA: Insufficient documentation

## 2023-07-30 DIAGNOSIS — M25511 Pain in right shoulder: Secondary | ICD-10-CM | POA: Insufficient documentation

## 2023-07-30 DIAGNOSIS — Z7189 Other specified counseling: Secondary | ICD-10-CM

## 2023-07-30 DIAGNOSIS — Z79891 Long term (current) use of opiate analgesic: Secondary | ICD-10-CM | POA: Diagnosis not present

## 2023-07-30 DIAGNOSIS — R059 Cough, unspecified: Secondary | ICD-10-CM | POA: Diagnosis not present

## 2023-07-30 DIAGNOSIS — Z808 Family history of malignant neoplasm of other organs or systems: Secondary | ICD-10-CM | POA: Insufficient documentation

## 2023-07-30 LAB — CBC WITH DIFFERENTIAL (CANCER CENTER ONLY)
Abs Immature Granulocytes: 0.01 10*3/uL (ref 0.00–0.07)
Basophils Absolute: 0 10*3/uL (ref 0.0–0.1)
Basophils Relative: 1 %
Eosinophils Absolute: 0.1 10*3/uL (ref 0.0–0.5)
Eosinophils Relative: 1 %
HCT: 45.3 % (ref 39.0–52.0)
Hemoglobin: 15 g/dL (ref 13.0–17.0)
Immature Granulocytes: 0 %
Lymphocytes Relative: 8 %
Lymphs Abs: 0.5 10*3/uL — ABNORMAL LOW (ref 0.7–4.0)
MCH: 29 pg (ref 26.0–34.0)
MCHC: 33.1 g/dL (ref 30.0–36.0)
MCV: 87.5 fL (ref 80.0–100.0)
Monocytes Absolute: 0.4 10*3/uL (ref 0.1–1.0)
Monocytes Relative: 7 %
Neutro Abs: 5.7 10*3/uL (ref 1.7–7.7)
Neutrophils Relative %: 83 %
Platelet Count: 184 10*3/uL (ref 150–400)
RBC: 5.18 MIL/uL (ref 4.22–5.81)
RDW: 15 % (ref 11.5–15.5)
WBC Count: 6.7 10*3/uL (ref 4.0–10.5)
nRBC: 0 % (ref 0.0–0.2)

## 2023-07-30 LAB — CMP (CANCER CENTER ONLY)
ALT: 11 U/L (ref 0–44)
AST: 11 U/L — ABNORMAL LOW (ref 15–41)
Albumin: 4.1 g/dL (ref 3.5–5.0)
Alkaline Phosphatase: 39 U/L (ref 38–126)
Anion gap: 7 (ref 5–15)
BUN: 13 mg/dL (ref 8–23)
CO2: 26 mmol/L (ref 22–32)
Calcium: 9.1 mg/dL (ref 8.9–10.3)
Chloride: 104 mmol/L (ref 98–111)
Creatinine: 0.87 mg/dL (ref 0.61–1.24)
GFR, Estimated: >60 mL/min (ref >60)
Glucose, Bld: 136 mg/dL — ABNORMAL HIGH (ref 70–99)
Potassium: 3.6 mmol/L (ref 3.5–5.1)
Sodium: 137 mmol/L (ref 135–145)
Total Bilirubin: 1.2 mg/dL — ABNORMAL HIGH (ref <1.2)
Total Protein: 6.3 g/dL — ABNORMAL LOW (ref 6.5–8.1)

## 2023-07-30 MED ORDER — PALONOSETRON HCL INJECTION 0.25 MG/5ML
0.2500 mg | Freq: Once | INTRAVENOUS | Status: AC
  Administered 2023-07-30: 0.25 mg via INTRAVENOUS
  Filled 2023-07-30: qty 5

## 2023-07-30 MED ORDER — DEXTROSE 5 % IV SOLN
70.0000 mg/m2 | Freq: Once | INTRAVENOUS | Status: AC
  Administered 2023-07-30: 140 mg via INTRAVENOUS
  Filled 2023-07-30: qty 60

## 2023-07-30 MED ORDER — DEXAMETHASONE SODIUM PHOSPHATE 10 MG/ML IJ SOLN
8.0000 mg | Freq: Once | INTRAMUSCULAR | Status: AC
  Administered 2023-07-30: 8 mg via INTRAVENOUS
  Filled 2023-07-30: qty 1

## 2023-07-30 MED ORDER — SODIUM CHLORIDE 0.9 % IV SOLN: Freq: Once | INTRAVENOUS | Status: AC

## 2023-07-30 MED ORDER — ZOLEDRONIC ACID 4 MG/100ML IV SOLN
4.0000 mg | Freq: Once | INTRAVENOUS | Status: AC
  Administered 2023-07-30: 4 mg via INTRAVENOUS
  Filled 2023-07-30: qty 100

## 2023-07-30 NOTE — Progress Notes (Signed)
Nutrition Follow-up:  Patient with multiple myeloma. He is receiving kyprolis + darzalex q28d (start 04/07/22)   Met with patient in infusion. Patient reports poor appetite recently related to current stressors at home which he does not see an end in site. Patient reports stress is triggering nausea and then he does not want to eat. Patient is trying to increase calories to promote weight stability. He recalls cake with coffee in the morning. Patient also gravitating towards chocolate. He has started drinking protein shake daily.    Medications: reviewed   Labs: reviewed   Anthropometrics: Wt 180 lb 9.6 oz today decreased 3% in 2 weeks, 5% wt loss in the last 4 weeks - this is clinically significant for time frame  11/13 - 190 lb 4.8 oz 11/27 - 186 lb    NUTRITION DIAGNOSIS: Unintended wt loss - continues     INTERVENTION:  Encouraged small frequent meals/snacks  Reviewed foods with protein, recommend protein food with every meal and snack Recommend increasing protein shake 2-3/day in between meals Support and encouragement  Referral to LCSW  - pt is aware and agreeable to referral  MONITORING, EVALUATION, GOAL: wt trends, intake   NEXT VISIT: Friday December 27 during infusion

## 2023-07-30 NOTE — Patient Instructions (Addendum)
[facility]  Discharge Instructions: Thank you for choosing [facility] to provide your oncology and hematology care.   If you have a lab appointment with the Cancer Center, please go directly to the Cancer Center and check in at the registration area.   Wear comfortable clothing and clothing appropriate for easy access to any Portacath or PICC line.   We strive to give you quality time with your provider. You may need to reschedule your appointment if you arrive late (15 or more minutes).  Arriving late affects you and other patients whose appointments are after yours.  Also, if you miss three or more appointments without notifying the office, you may be dismissed from the clinic at the provider's discretion.      For prescription refill requests, have your pharmacy contact our office and allow 72 hours for refills to be completed.    Today you received the following chemotherapy and/or immunotherapy agents Kyprolis      To help prevent nausea and vomiting after your treatment, we encourage you to take your nausea medication as directed.  BELOW ARE SYMPTOMS THAT SHOULD BE REPORTED IMMEDIATELY: *FEVER GREATER THAN 100.4 F (38 C) OR HIGHER *CHILLS OR SWEATING *NAUSEA AND VOMITING THAT IS NOT CONTROLLED WITH YOUR NAUSEA MEDICATION *UNUSUAL SHORTNESS OF BREATH *UNUSUAL BRUISING OR BLEEDING *URINARY PROBLEMS (pain or burning when urinating, or frequent urination) *BOWEL PROBLEMS (unusual diarrhea, constipation, pain near the anus) TENDERNESS IN MOUTH AND THROAT WITH OR WITHOUT PRESENCE OF ULCERS (sore throat, sores in mouth, or a toothache) UNUSUAL RASH, SWELLING OR PAIN  UNUSUAL VAGINAL DISCHARGE OR ITCHING   Items with * indicate a potential emergency and should be followed up as soon as possible or go to the Emergency Department if any problems should occur.  Please show the CHEMOTHERAPY ALERT CARD or IMMUNOTHERAPY  ALERT CARD at check-in to the Emergency Department and triage nurse.  Should you have questions after your visit or need to cancel or reschedule your appointment, please contact [facility]  Dept: [phone]  and follow the prompts.  Office hours are 8:00 a.m. to 4:30 p.m. Monday - Friday. Please note that voicemails left after 4:00 p.m. may not be returned until the following business day.  We are closed weekends and major holidays. You have access to a nurse at all times for urgent questions. Please call the main number to the clinic Dept: [phone] and follow the prompts.   For any non-urgent questions, you may also contact your provider using MyChart. We now offer e-Visits for anyone 56 and older to request care online for non-urgent symptoms. For details visit [email or web address].   Also download the MyChart app! Go to the app store, search "MyChart", open the app, select [place], and log in with your MyChart username and password.  Zoledronic Acid Injection (Cancer) What is this medication? ZOLEDRONIC ACID (ZOE le dron ik AS id) treats high calcium levels in the blood caused by cancer. It may also be used with chemotherapy to treat weakened bones caused by cancer. It works by slowing down the release of calcium from bones. This lowers calcium levels in your blood. It also makes your bones stronger and less likely to break (fracture). It belongs to a group of medications called bisphosphonates. This medicine may be used for other purposes; ask your health care provider  or pharmacist if you have questions. COMMON BRAND NAME(S): Zometa, Zometa Powder What should I tell my care team before I take this medication? They need to know if you have any of these conditions: Dehydration Dental disease Kidney disease Liver disease Low levels of calcium in the blood Lung or breathing disease, such as asthma Receiving steroids, such as  dexamethasone or prednisone An unusual or allergic reaction to zoledronic acid, other medications, foods, dyes, or preservatives Pregnant or trying to get pregnant Breast-feeding How should I use this medication? This medication is injected into a vein. It is given by your care team in a hospital or clinic setting. Talk to your care team about the use of this medication in children. Special care may be needed. Overdosage: If you think you have taken too much of this medicine contact a poison control center or emergency room at once. NOTE: This medicine is only for you. Do not share this medicine with others. What if I miss a dose? Keep appointments for follow-up doses. It is important not to miss your dose. Call your care team if you are unable to keep an appointment. What may interact with this medication? Certain antibiotics given by injection Diuretics, such as bumetanide, furosemide NSAIDs, medications for pain and inflammation, such as ibuprofen or naproxen Teriparatide Thalidomide This list may not describe all possible interactions. Give your health care provider a list of all the medicines, herbs, non-prescription drugs, or dietary supplements you use. Also tell them if you smoke, drink alcohol, or use illegal drugs. Some items may interact with your medicine. What should I watch for while using this medication? Visit your care team for regular checks on your progress. It may be some time before you see the benefit from this medication. Some people who take this medication have severe bone, joint, or muscle pain. This medication may also increase your risk for jaw problems or a broken thigh bone. Tell your care team right away if you have severe pain in your jaw, bones, joints, or muscles. Tell you care team if you have any pain that does not go away or that gets worse. Tell your dentist and dental surgeon that you are taking this medication. You should not have major dental surgery  while on this medication. See your dentist to have a dental exam and fix any dental problems before starting this medication. Take good care of your teeth while on this medication. Make sure you see your dentist for regular follow-up appointments. You should make sure you get enough calcium and vitamin D while you are taking this medication. Discuss the foods you eat and the vitamins you take with your care team. Check with your care team if you have severe diarrhea, nausea, and vomiting, or if you sweat a lot. The loss of too much body fluid may make it dangerous for you to take this medication. You may need bloodwork while taking this medication. Talk to your care team if you wish to become pregnant or think you might be pregnant. This medication can cause serious birth defects. What side effects may I notice from receiving this medication? Side effects that you should report to your care team as soon as possible: Allergic reactions--skin rash, itching, hives, swelling of the face, lips, tongue, or throat Kidney injury--decrease in the amount of urine, swelling of the ankles, hands, or feet Low calcium level--muscle pain or cramps, confusion, tingling, or numbness in the hands or feet Osteonecrosis of the jaw--pain, swelling, or redness  in the mouth, numbness of the jaw, poor healing after dental work, unusual discharge from the mouth, visible bones in the mouth Severe bone, joint, or muscle pain Side effects that usually do not require medical attention (report to your care team if they continue or are bothersome): Constipation Fatigue Fever Loss of appetite Nausea Stomach pain This list may not describe all possible side effects. Call your doctor for medical advice about side effects. You may report side effects to FDA at 1-800-FDA-1088. Where should I keep my medication? This medication is given in a hospital or clinic. It will not be stored at home. NOTE: This sheet is a summary. It may  not cover all possible information. If you have questions about this medicine, talk to your doctor, pharmacist, or health care provider.  2024 Elsevier/Gold Standard (2021-09-28 00:00:00) Zoledronic Acid Injection (Cancer) What is this medication? ZOLEDRONIC ACID (ZOE le dron ik AS id) treats high calcium levels in the blood caused by cancer. It may also be used with chemotherapy to treat weakened bones caused by cancer. It works by slowing down the release of calcium from bones. This lowers calcium levels in your blood. It also makes your bones stronger and less likely to break (fracture). It belongs to a group of medications called bisphosphonates. This medicine may be used for other purposes; ask your health care provider or pharmacist if you have questions. COMMON BRAND NAME(S): Zometa, Zometa Powder What should I tell my care team before I take this medication? They need to know if you have any of these conditions: Dehydration Dental disease Kidney disease Liver disease Low levels of calcium in the blood Lung or breathing disease, such as asthma Receiving steroids, such as dexamethasone or prednisone An unusual or allergic reaction to zoledronic acid, other medications, foods, dyes, or preservatives Pregnant or trying to get pregnant Breast-feeding How should I use this medication? This medication is injected into a vein. It is given by your care team in a hospital or clinic setting. Talk to your care team about the use of this medication in children. Special care may be needed. Overdosage: If you think you have taken too much of this medicine contact a poison control center or emergency room at once. NOTE: This medicine is only for you. Do not share this medicine with others. What if I miss a dose? Keep appointments for follow-up doses. It is important not to miss your dose. Call your care team if you are unable to keep an appointment. What may interact with this medication? Certain  antibiotics given by injection Diuretics, such as bumetanide, furosemide NSAIDs, medications for pain and inflammation, such as ibuprofen or naproxen Teriparatide Thalidomide This list may not describe all possible interactions. Give your health care provider a list of all the medicines, herbs, non-prescription drugs, or dietary supplements you use. Also tell them if you smoke, drink alcohol, or use illegal drugs. Some items may interact with your medicine. What should I watch for while using this medication? Visit your care team for regular checks on your progress. It may be some time before you see the benefit from this medication. Some people who take this medication have severe bone, joint, or muscle pain. This medication may also increase your risk for jaw problems or a broken thigh bone. Tell your care team right away if you have severe pain in your jaw, bones, joints, or muscles. Tell you care team if you have any pain that does not go away or that gets  worse. Tell your dentist and dental surgeon that you are taking this medication. You should not have major dental surgery while on this medication. See your dentist to have a dental exam and fix any dental problems before starting this medication. Take good care of your teeth while on this medication. Make sure you see your dentist for regular follow-up appointments. You should make sure you get enough calcium and vitamin D while you are taking this medication. Discuss the foods you eat and the vitamins you take with your care team. Check with your care team if you have severe diarrhea, nausea, and vomiting, or if you sweat a lot. The loss of too much body fluid may make it dangerous for you to take this medication. You may need bloodwork while taking this medication. Talk to your care team if you wish to become pregnant or think you might be pregnant. This medication can cause serious birth defects. What side effects may I notice from receiving  this medication? Side effects that you should report to your care team as soon as possible: Allergic reactions--skin rash, itching, hives, swelling of the face, lips, tongue, or throat Kidney injury--decrease in the amount of urine, swelling of the ankles, hands, or feet Low calcium level--muscle pain or cramps, confusion, tingling, or numbness in the hands or feet Osteonecrosis of the jaw--pain, swelling, or redness in the mouth, numbness of the jaw, poor healing after dental work, unusual discharge from the mouth, visible bones in the mouth Severe bone, joint, or muscle pain Side effects that usually do not require medical attention (report to your care team if they continue or are bothersome): Constipation Fatigue Fever Loss of appetite Nausea Stomach pain This list may not describe all possible side effects. Call your doctor for medical advice about side effects. You may report side effects to FDA at 1-800-FDA-1088. Where should I keep my medication? This medication is given in a hospital or clinic. It will not be stored at home. NOTE: This sheet is a summary. It may not cover all possible information. If you have questions about this medicine, talk to your doctor, pharmacist, or health care provider.  2024 Elsevier/Gold Standard (2021-09-28 00:00:00)

## 2023-07-30 NOTE — Progress Notes (Signed)
HEMATOLOGY/ONCOLOGY CLINIC NOTE  Date of Service: 07/30/23   Patient Care Team: Milus Height, [place] as PCP - General (Nurse Practitioner)  CHIEF COMPLAINTS/PURPOSE OF CONSULTATION:  Follow-up for continued evaluation and management of multiple myeloma  HISTORY OF PRESENTING ILLNESS:   Anthony [name] is a wonderful 62 y.o. male who has been referred to [place] by Dr Milus Height, PA for evaluation and management of newly diagnosed multiple myeloma. He reports He is doing well.  He reports persistent lower back pain that he has had since he was in his 20's. He notes no previous significant injuries. He further notes that he gets chiropractic adjustments and takes Tylenol to manage his symptoms. He notes less back pain when standing up on his right leg and maintaining weight on his right leg.  He reports previous history of smoking and chewing tobacco over 40 years ago.  He had a recent fall back in February of this year with only a pulled muscle. And he notes another fall when chasing his cat where he fell on his right shoulder. He reports pain in right shoulder.   He had a recent COVID-19 infection back in March.  He reports intermittent cough with SOB. He notes he takes 2 Rolaids at night. We discussed potentially trying antacids which he is agreeable to as he will begin steroids soon and was advised of the possible symptoms and side effects from taking steroids.  We discussed CRAB criteria and that he meets at least two of the criterion being  anemia and bone disease.  We discussed getting additional scans for further evaluation which he is agreeable to.  We further discussed starting Daratumumab/Velcade/Dexamethasone/Zometa and Revlimid for treatment which he is agreeable to. We also discussed starting steroids before treatment and taking an acid suppressant which he was also agreeable to.  We discussed getting Senna and taking it as needed for constipation.  Labs done today  were reviewed in detail.  We discussed his recent bone marrow biopsy and aspiration done 02/06/2022.  We discussed PET/CT scan done 01/28/2022.  We discussed CT right shoulder w/o contrast done 11/13/2021  INTERVAL HISTORY:  Anthony [name] is a 62 y.o. male, is here for continued evaluation of and management of multiple myeloma. Patient is here for toxicity check prior to receiving cycle 17 day 15 of his Carfilzomib/Revimid/Dexamethasone treatment.  Patient was last seen by me on 07/02/2023 and reported mild leg swelling, minor scratches in upper extremities attributed to yard work, and stable fatigue.  Today, he is accompanied by his wife. He reports endorsing redness in his left cheek which has been present for 5 years. Patient notes that this symptom has flared recently, which he attributes to sun exposure while driving.   His wife reports that his symptoms worsen with activity and after treatment. Patient reports that he has not noticed whether his symptoms worsen soon after treatment. He denies any food or cream trigger causing a local reaction. He does regularly drink tea/coffee. He reports that he does not feel dry heat is a trigger.   Patient reports that he has never been diagnosed with rosacea in the past and has not been on any rosacea medications previously.   Patient reports that his blood pressure has been elevated earlier this week due to family issues. He notes one blood pressure reading of 165/175. Patient also reports systolic readings ranging 100-186. His blood pressure in clinic today is 135/85. He does take claritin every morning.   He is  not taking Lasix at this time and he has not taken Medrol Dosepak recently.   He generally drinks at least four 16.9-ounce bottles of water a day.   Patient reports a Velcade allergy, causing rash symptoms.   MEDICAL HISTORY:  Past Medical History:  Diagnosis Date   Back pain    Cancer (HCC)    Multiple myeloma (HCC) 02/15/2022     SURGICAL HISTORY: Past Surgical History:  Procedure Laterality Date   IR BONE TUMOR(S)RF ABLATION  05/07/2022   IR BONE TUMOR(S)RF ABLATION  05/07/2022   IR BONE TUMOR(S)RF ABLATION  05/07/2022   IR KYPHO EA ADDL LEVEL THORACIC OR LUMBAR  05/07/2022   IR KYPHO LUMBAR INC FX REDUCE BONE BX UNI/BIL CANNULATION INC/IMAGING  05/07/2022   IR KYPHO THORACIC WITH BONE BIOPSY  05/07/2022   IR RADIOLOGIST EVAL & MGMT  04/23/2022   IR RADIOLOGIST EVAL & MGMT  05/17/2022   IR RADIOLOGIST EVAL & MGMT  06/06/2022    SOCIAL HISTORY: Social History   Socioeconomic History   Marital status: Married    Spouse name: Not on file   Number of children: Not on file   Years of education: Not on file   Highest education level: Not on file  Occupational History   Not on file  Tobacco Use   Smoking status: Never   Smokeless tobacco: Former    Types: Chew, Snuff  Vaping Use   Vaping status: Never Used  Substance and Sexual Activity   Alcohol use: Yes    Alcohol/week: 2.0 standard drinks of alcohol    Types: 2 Glasses of wine per week   Drug use: No   Sexual activity: Yes  Other Topics Concern   Not on file  Social History Narrative   Not on file   Social Determinants of Health   Financial Resource Strain: Low Risk  (05/07/2022)   Overall Financial Resource Strain (CARDIA)    Difficulty of Paying Living Expenses: Not very hard  Food Insecurity: No Food Insecurity (05/07/2022)   Hunger Vital Sign    Worried About Running Out of Food in the Last Year: Never true    Ran Out of Food in the Last Year: Never true  Transportation Needs: No Transportation Needs (05/07/2022)   PRAPARE - Administrator, Civil Service (Medical): No    Lack of Transportation (Non-Medical): No  Physical Activity: Not on file  Stress: Not on file  Social Connections: Not on file  Intimate Partner Violence: Unknown (05/07/2022)   Humiliation, Afraid, Rape, and Kick questionnaire    Fear of Current or Ex-Partner:  No    Emotionally Abused: No    Physically Abused: No    Sexually Abused: Not on file    FAMILY HISTORY: Family History  Problem Relation Age of Onset   Rheum arthritis Mother    Other Sister        Pre-cancerous uterine mass;    Rheum arthritis Sister    Bone cancer Paternal Grandfather    Brain cancer Maternal Uncle    Brain cancer Maternal Aunt    Osteoporosis Neg Hx     ALLERGIES:  has No Known Allergies.  MEDICATIONS:  Current Outpatient Medications  Medication Sig Dispense Refill   acyclovir (ZOVIRAX) 400 MG tablet Take 1 tablet (400 mg total) by mouth 2 (two) times daily. 60 tablet 11   aluminum chloride (DRYSOL) 20 % external solution Apply 1 application topically at bedtime.     calcium-vitamin D (OSCAL  WITH D) 250-125 MG-UNIT tablet Take 1 tablet by mouth daily.     cyanocobalamin (VITAMIN B12) 1000 MCG tablet Take 1 tablet (1,000 mcg total) by mouth daily. 30 tablet 3   cyclobenzaprine (FLEXERIL) 10 MG tablet Take 10 mg by mouth 3 (three) times daily as needed for muscle spasms.     diclofenac sodium (VOLTAREN) 1 % GEL Apply 2 g topically as needed.     dronabinol (MARINOL) 10 MG capsule Take 1 capsule (10 mg total) by mouth 2 (two) times daily before a meal. 60 capsule 0   ELIQUIS 5 MG TABS tablet Take 1 tablet (5 mg total) by mouth 2 (two) times daily. 60 tablet 5   ergocalciferol (VITAMIN D2) 1.25 MG (50000 UT) capsule Take 1 capsule (50,000 Units total) by mouth 2 (two) times a week. 12 capsule 2   esomeprazole (NEXIUM) 40 MG capsule Take 1 capsule (40 mg total) by mouth daily. Take 1 capsule (40 mg total) daily before breakfast 30 capsule 3   fentaNYL (DURAGESIC) 25 MCG/HR Place 1 patch onto the skin every 3 (three) days. 10 patch 0   furosemide (LASIX) 20 MG tablet Take 1 tablet (20 mg total) by mouth daily. 30 tablet 1   lidocaine (LIDODERM) 5 % Place 1 patch onto the skin daily. Remove & Discard patch within 12 hours or as directed by MD 30 patch 0   LORazepam  (ATIVAN) 0.5 MG tablet Take 1 tablet (0.5 mg total) by mouth every 6 (six) hours as needed (Nausea or vomiting). 30 tablet 0   methocarbamol (ROBAXIN) 500 MG tablet Take 1 tablet (500 mg total) by mouth every 8 (eight) hours as needed for muscle spasms. 30 tablet 0   methylPREDNISolone (MEDROL DOSEPAK) 4 MG TBPK tablet Take 6 pills by mouth day 1, 5 on day 2, 4 on day 3, 3 on day 4, 2 on day 5, 1 on day 6 21 tablet 0   Multiple Vitamin (MULTIVITAMIN) tablet Take 1 tablet by mouth daily.     Omega-3 Fatty Acids (FISH OIL) 1360 MG CAPS Take 1 capsule by mouth daily.     ondansetron (ZOFRAN) 8 MG tablet Take 1 tablet (8 mg total) by mouth every 8 (eight) hours as needed for nausea or vomiting. 30 tablet 0   oxyCODONE (ROXICODONE) 5 MG immediate release tablet Take 1 tablet (5 mg total) by mouth every 4 (four) hours as needed for severe pain (pain score 7-10) or moderate pain (pain score 4-6). Take 1 to 2 tablets every 4 hours as needed for moderate to severe pain. 60 tablet 0   prochlorperazine (COMPAZINE) 10 MG tablet TAKE ONE TABLET BY MOUTH EVERY 6 HOURS AS NEEDED FOR NAUSEA AND VOMITING 30 tablet 1   REVLIMID 15 MG capsule TAKE 1 CAPSULE DAILY FOR 21 DAYS ON, THEN 7 DAYS OFF 21 capsule 0   senna-docusate (SENNA S) 8.6-50 MG tablet Take 2 tablets by mouth at bedtime. 60 tablet 1   terbinafine (LAMISIL) 1 % cream Apply 1 application topically 2 (two) times daily.     No current facility-administered medications for this visit.   REVIEW OF SYSTEMS:  10 Point review of Systems was done is negative except as noted above.   PHYSICAL EXAMINATION: .BP 135/85   Pulse 77   Temp 98.1 F (36.7 C)   Resp 20   Wt 180 lb 9.6 oz (81.9 kg)   SpO2 99%   BMI 28.29 kg/m   GENERAL:alert, in no acute distress  and comfortable SKIN: no acute rashes, no significant lesions EYES: conjunctiva are pink and non-injected, sclera anicteric OROPHARYNX: MMM, no exudates, no oropharyngeal erythema or  ulceration NECK: supple, no JVD LYMPH:  no palpable lymphadenopathy in the cervical, axillary or inguinal regions LUNGS: clear to auscultation b/l with normal respiratory effort HEART: regular rate & rhythm ABDOMEN:  normoactive bowel sounds , non tender, not distended. Extremity: no pedal edema PSYCH: alert & oriented x 3 with fluent speech NEURO: no focal motor/sensory deficits   LABORATORY DATA:  I have reviewed the data as listed .    Latest Ref Rng & Units 07/30/2023   12:03 PM 07/16/2023    8:00 AM 07/02/2023    9:21 AM  CBC  WBC 4.0 - 10.5 K/uL 6.7  3.7  3.5   Hemoglobin 13.0 - 17.0 g/dL 16.1  09.6  04.5   Hematocrit 39.0 - 52.0 % 45.3  44.2  43.2   Platelets 150 - 400 K/uL 184  153  175       Latest Ref Rng & Units 07/30/2023   12:03 PM 07/16/2023    8:00 AM 07/02/2023    9:21 AM  CMP  Glucose 70 - 99 mg/dL 409  90  811   BUN 8 - 23 mg/dL 13  12  9    Creatinine 0.61 - 1.24 mg/dL 9.14  7.82  9.56   Sodium 135 - 145 mmol/L 137  136  136   Potassium 3.5 - 5.1 mmol/L 3.6  3.6  3.7   Chloride 98 - 111 mmol/L 104  105  105   CO2 22 - 32 mmol/L 26  25  27    Calcium 8.9 - 10.3 mg/dL 9.1  8.9  9.1   Total Protein 6.5 - 8.1 g/dL 6.3  6.3  6.4   Total Bilirubin <1.2 mg/dL 1.2  0.8  1.1   Alkaline Phos 38 - 126 U/L 39  37  40   AST 15 - 41 U/L 11  12  14    ALT 0 - 44 U/L 11  15  12       PATHOLOGY Surgical Pathology CASE: [id number] PATIENT: Nickalos Francom Bone Marrow Report     Clinical History: Multiple Myeloma     DIAGNOSIS:  BONE MARROW, ASPIRATE, CLOT, CORE: -Hypercellular bone marrow (60%) with erythroid predominant trilineage hematopoiesis and no evidence of residual plasma cell neoplasm (less than 1% plasma cells by manual aspirate differential, and CD138 immunohistochemical analysis of clot and core sections).  PERIPHERAL BLOOD: -Leukopenia  MICROSCOPIC DESCRIPTION:  PERIPHERAL BLOOD SMEAR: Platelets: Adequate in number, no platelet clumps  identified Erythroid: Normocytic normochromic red blood cells Leukocytes: Mild leukopenia, negative for dysplastic granulocytes, blasts or  plasma cells  BONE MARROW ASPIRATE: Cellular Erythroid precursors: Relatively increased, show a full sequence of maturation with occasional abnormal forms (including binucleate forms, megaloblastoid change and nuclear membrane irregularities) Granulocytic precursors: Decreased, show a full sequence of generally orderly maturation Megakaryocytes: Normal in number and morphology Lymphocytes/plasma cells: Not increased   02/06/2022 Molecular pathology   RADIOGRAPHIC STUDIES: I have personally reviewed the radiological images as listed and agreed with the findings in the report. No results found.   ASSESSMENT & PLAN:   62 y.o. very pleasant male with  1. R-ISS stage III multiple myeloma with extensive bone metastases and patholgiic fracture rt shoulder -Recent bone marrow biopsy and aspiration done 02/06/2022 revealed hypercellular bone marrow with plasma cell neoplasm. Cytogenetics-normal karyotype Molecular cytogenetics-TP53 deletion and duplication of 1 q.Marland Kitchen  PLAN:  -Discussed lab results on 07/30/23 in detail with patient. CBC stable, showed WBC of 6.7K, hemoglobin of 15.0, and platelets of 184K. -CMP stable -last myeloma panel from 2 weeks ago showed that his M protein continued to be undetectable and patient continued to be in remission -no clinical sign or evidence of multiple myeloma progression at this time -continue maintenance Revlimid treatment and maintenance Carfilzomib -continue 1,000 mcg daily vitamin B12  -continue B complex supplements -continue vitamin D once a week -educated patient that stress, steroids, or Carfilzomib could potentially trigger rosacea -will order Metronidazole antibiotic cream to apply twice a day for 1-2 weeks to clear any bacteria that could trigger local inflammation -recommend regular use of sun  protection  -educated patient on potential factors which may increase blood pressure to some degree, including Claritin use, dehydration, and pain -advised patient to regularly check his blood pressure levels in the mornings -advised patient to de-stress, limit salt-intake, optimize water intake, and limit caffeine-intake to improve blood pressure. Discussed that if his blood pressure does not improve after making these adjustments, there may be a role for him to connect with his PCP  -discussed option to connect with a social worker to discuss social security benefits -answered all of patient's and his wife's questions in detail  FOLLOW-UP: Per integrated scheduling  The total time spent in the appointment was 30 minutes* .  All of the patient's questions were answered with apparent satisfaction. The patient knows to call the clinic with any problems, questions or concerns.   Wyvonnia Lora MD MS AAHIVMS [facility] Hematology/Oncology Physician [facility]  .*Total Encounter Time as defined by the Centers for Medicare and Medicaid Services includes, in addition to the face-to-face time of a patient visit (documented in the note above) non-face-to-face time: obtaining and reviewing outside history, ordering and reviewing medications, tests or procedures, care coordination (communications with other health care professionals or caregivers) and documentation in the medical record.    I,Mitra Faeizi,acting as a Neurosurgeon for Wyvonnia Lora, MD.,have documented all relevant documentation on the behalf of Wyvonnia Lora, MD,as directed by  Wyvonnia Lora, MD while in the presence of Wyvonnia Lora, MD.  .I have reviewed the above documentation for accuracy and completeness, and I agree with the above. Johney Maine MD

## 2023-07-30 NOTE — Progress Notes (Signed)
Patient seen by Dr. Kale  Vitals are within treatment parameters.  Labs reviewed: and are within treatment parameters.  Per physician team, patient is ready for treatment and there are NO modifications to the treatment plan.  

## 2023-07-31 ENCOUNTER — Inpatient Hospital Stay: Payer: BC Managed Care – PPO

## 2023-07-31 ENCOUNTER — Other Ambulatory Visit: Payer: Self-pay

## 2023-07-31 NOTE — Progress Notes (Signed)
[facility] Clinical Social Work  Clinical Social Work was referred by  dietician  for assessment of psychosocial needs.  Clinical Social Worker contacted patient by phone to offer support and assess for needs.  Patient expressed increase in stress from different sources and ultimate impact on health. Patient expressed interest in receiving therapy to improve stress level. CSW provided options of long term community based or short term with CSW. Patient preferred short term services, appointment was scheduled.    Marguerita Merles, LCSW  Clinical Social Worker [facility]

## 2023-08-04 ENCOUNTER — Other Ambulatory Visit: Payer: Self-pay

## 2023-08-04 ENCOUNTER — Inpatient Hospital Stay: Payer: BC Managed Care – PPO

## 2023-08-04 DIAGNOSIS — C9000 Multiple myeloma not having achieved remission: Secondary | ICD-10-CM

## 2023-08-04 MED ORDER — ERGOCALCIFEROL 1.25 MG (50000 UT) PO CAPS: 50000.0000 [IU] | ORAL_CAPSULE | ORAL | 2 refills | Status: DC

## 2023-08-05 ENCOUNTER — Other Ambulatory Visit: Payer: Self-pay

## 2023-08-05 ENCOUNTER — Encounter: Payer: Self-pay | Admitting: Hematology

## 2023-08-05 MED ORDER — METRONIDAZOLE 1 % EX GEL: Freq: Every day | CUTANEOUS | 0 refills | Status: AC

## 2023-08-08 ENCOUNTER — Other Ambulatory Visit: Payer: Self-pay

## 2023-08-08 DIAGNOSIS — G4733 Obstructive sleep apnea (adult) (pediatric): Secondary | ICD-10-CM | POA: Diagnosis not present

## 2023-08-08 DIAGNOSIS — C9000 Multiple myeloma not having achieved remission: Secondary | ICD-10-CM

## 2023-08-08 DIAGNOSIS — Z7189 Other specified counseling: Secondary | ICD-10-CM

## 2023-08-08 MED ORDER — PROCHLORPERAZINE MALEATE 10 MG PO TABS: ORAL_TABLET | ORAL | 1 refills | Status: AC

## 2023-08-12 ENCOUNTER — Telehealth: Payer: Self-pay | Admitting: *Deleted

## 2023-08-12 ENCOUNTER — Other Ambulatory Visit: Payer: Self-pay | Admitting: *Deleted

## 2023-08-12 DIAGNOSIS — C9000 Multiple myeloma not having achieved remission: Secondary | ICD-10-CM

## 2023-08-12 MED ORDER — REVLIMID 15 MG PO CAPS: ORAL_CAPSULE | ORAL | 0 refills | Status: DC

## 2023-08-12 MED ORDER — ACYCLOVIR 400 MG PO TABS: 400.0000 mg | ORAL_TABLET | Freq: Two times a day (BID) | ORAL | 11 refills | Status: DC

## 2023-08-12 NOTE — Telephone Encounter (Signed)
Pt called requesting refills on his zovirax, Revlimid & Fentanyl patches.  He takes his last Revlimid tomorrow & will be off 7 days.  He has 4 left of his Fentanyl patches so 12 days worth which I told him would probably be too early to fill.  Message routed to Dr Candise Che.

## 2023-08-14 ENCOUNTER — Other Ambulatory Visit: Payer: Self-pay | Admitting: Hematology

## 2023-08-14 ENCOUNTER — Other Ambulatory Visit: Payer: Self-pay | Admitting: *Deleted

## 2023-08-14 DIAGNOSIS — C9000 Multiple myeloma not having achieved remission: Secondary | ICD-10-CM

## 2023-08-14 DIAGNOSIS — Z7189 Other specified counseling: Secondary | ICD-10-CM

## 2023-08-14 MED ORDER — REVLIMID 15 MG PO CAPS: ORAL_CAPSULE | ORAL | 0 refills | Status: DC

## 2023-08-15 ENCOUNTER — Inpatient Hospital Stay: Payer: BC Managed Care – PPO | Admitting: Dietician

## 2023-08-15 ENCOUNTER — Inpatient Hospital Stay: Payer: BC Managed Care – PPO

## 2023-08-15 VITALS — BP 135/80 | HR 65 | Temp 98.0°F | Resp 16 | Wt 185.2 lb

## 2023-08-15 DIAGNOSIS — R0602 Shortness of breath: Secondary | ICD-10-CM | POA: Diagnosis not present

## 2023-08-15 DIAGNOSIS — M25511 Pain in right shoulder: Secondary | ICD-10-CM | POA: Diagnosis not present

## 2023-08-15 DIAGNOSIS — Z79891 Long term (current) use of opiate analgesic: Secondary | ICD-10-CM | POA: Diagnosis not present

## 2023-08-15 DIAGNOSIS — Z809 Family history of malignant neoplasm, unspecified: Secondary | ICD-10-CM | POA: Diagnosis not present

## 2023-08-15 DIAGNOSIS — C9000 Multiple myeloma not having achieved remission: Secondary | ICD-10-CM

## 2023-08-15 DIAGNOSIS — M545 Low back pain, unspecified: Secondary | ICD-10-CM | POA: Diagnosis not present

## 2023-08-15 DIAGNOSIS — R059 Cough, unspecified: Secondary | ICD-10-CM | POA: Diagnosis not present

## 2023-08-15 DIAGNOSIS — Z7189 Other specified counseling: Secondary | ICD-10-CM

## 2023-08-15 DIAGNOSIS — D72819 Decreased white blood cell count, unspecified: Secondary | ICD-10-CM | POA: Diagnosis not present

## 2023-08-15 DIAGNOSIS — Z87891 Personal history of nicotine dependence: Secondary | ICD-10-CM | POA: Diagnosis not present

## 2023-08-15 DIAGNOSIS — Z808 Family history of malignant neoplasm of other organs or systems: Secondary | ICD-10-CM | POA: Diagnosis not present

## 2023-08-15 DIAGNOSIS — Z5112 Encounter for antineoplastic immunotherapy: Secondary | ICD-10-CM | POA: Diagnosis not present

## 2023-08-15 LAB — CBC WITH DIFFERENTIAL (CANCER CENTER ONLY)
Abs Immature Granulocytes: 0.03 10*3/uL (ref 0.00–0.07)
Basophils Absolute: 0 10*3/uL (ref 0.0–0.1)
Basophils Relative: 0 %
Eosinophils Absolute: 0.1 10*3/uL (ref 0.0–0.5)
Eosinophils Relative: 1 %
HCT: 44.7 % (ref 39.0–52.0)
Hemoglobin: 15 g/dL (ref 13.0–17.0)
Immature Granulocytes: 1 %
Lymphocytes Relative: 19 %
Lymphs Abs: 0.8 10*3/uL (ref 0.7–4.0)
MCH: 28.6 pg (ref 26.0–34.0)
MCHC: 33.6 g/dL (ref 30.0–36.0)
MCV: 85.3 fL (ref 80.0–100.0)
Monocytes Absolute: 0.6 10*3/uL (ref 0.1–1.0)
Monocytes Relative: 14 %
Neutro Abs: 2.7 10*3/uL (ref 1.7–7.7)
Neutrophils Relative %: 65 %
Platelet Count: 177 10*3/uL (ref 150–400)
RBC: 5.24 MIL/uL (ref 4.22–5.81)
RDW: 14.9 % (ref 11.5–15.5)
WBC Count: 4.2 10*3/uL (ref 4.0–10.5)
nRBC: 0 % (ref 0.0–0.2)

## 2023-08-15 LAB — CMP (CANCER CENTER ONLY)
ALT: 12 U/L (ref 0–44)
AST: 10 U/L — ABNORMAL LOW (ref 15–41)
Albumin: 4.1 g/dL (ref 3.5–5.0)
Alkaline Phosphatase: 42 U/L (ref 38–126)
Anion gap: 7 (ref 5–15)
BUN: 16 mg/dL (ref 8–23)
CO2: 25 mmol/L (ref 22–32)
Calcium: 9.3 mg/dL (ref 8.9–10.3)
Chloride: 105 mmol/L (ref 98–111)
Creatinine: 0.76 mg/dL (ref 0.61–1.24)
GFR, Estimated: >60 mL/min (ref >60)
Glucose, Bld: 95 mg/dL (ref 70–99)
Potassium: 3.8 mmol/L (ref 3.5–5.1)
Sodium: 137 mmol/L (ref 135–145)
Total Bilirubin: 0.8 mg/dL (ref <1.2)
Total Protein: 6.2 g/dL — ABNORMAL LOW (ref 6.5–8.1)

## 2023-08-15 MED ORDER — DEXAMETHASONE SODIUM PHOSPHATE 10 MG/ML IJ SOLN
8.0000 mg | Freq: Once | INTRAMUSCULAR | Status: AC
  Administered 2023-08-15: 8 mg via INTRAVENOUS
  Filled 2023-08-15: qty 1

## 2023-08-15 MED ORDER — SODIUM CHLORIDE 0.9 % IV SOLN: Freq: Once | INTRAVENOUS | Status: AC

## 2023-08-15 MED ORDER — ACETAMINOPHEN 325 MG PO TABS
650.0000 mg | ORAL_TABLET | Freq: Once | ORAL | Status: AC
  Administered 2023-08-15: 650 mg via ORAL
  Filled 2023-08-15: qty 2

## 2023-08-15 MED ORDER — DEXTROSE 5 % IV SOLN
70.0000 mg/m2 | Freq: Once | INTRAVENOUS | Status: AC
  Administered 2023-08-15: 140 mg via INTRAVENOUS
  Filled 2023-08-15: qty 60

## 2023-08-15 MED ORDER — PALONOSETRON HCL INJECTION 0.25 MG/5ML
0.2500 mg | Freq: Once | INTRAVENOUS | Status: AC
  Administered 2023-08-15: 0.25 mg via INTRAVENOUS
  Filled 2023-08-15: qty 5

## 2023-08-15 NOTE — Patient Instructions (Signed)
 [facility] - A DEPT OF [facility]  Discharge Instructions: Thank you for choosing [facility] to provide your oncology and hematology care.   If you have a lab appointment with the Cancer Center, please go directly to the Cancer Center and check in at the registration area.   Wear comfortable clothing and clothing appropriate for easy access to any Portacath or PICC line.   We strive to give you quality time with your provider. You may need to reschedule your appointment if you arrive late (15 or more minutes).  Arriving late affects you and other patients whose appointments are after yours.  Also, if you miss three or more appointments without notifying the office, you may be dismissed from the clinic at the provider's discretion.      For prescription refill requests, have your pharmacy contact our office and allow 72 hours for refills to be completed.    Today you received the following chemotherapy and/or immunotherapy agents: carfilzomib      To help prevent nausea and vomiting after your treatment, we encourage you to take your nausea medication as directed.  BELOW ARE SYMPTOMS THAT SHOULD BE REPORTED IMMEDIATELY: *FEVER GREATER THAN 100.4 F (38 C) OR HIGHER *CHILLS OR SWEATING *NAUSEA AND VOMITING THAT IS NOT CONTROLLED WITH YOUR NAUSEA MEDICATION *UNUSUAL SHORTNESS OF BREATH *UNUSUAL BRUISING OR BLEEDING *URINARY PROBLEMS (pain or burning when urinating, or frequent urination) *BOWEL PROBLEMS (unusual diarrhea, constipation, pain near the anus) TENDERNESS IN MOUTH AND THROAT WITH OR WITHOUT PRESENCE OF ULCERS (sore throat, sores in mouth, or a toothache) UNUSUAL RASH, SWELLING OR PAIN  UNUSUAL VAGINAL DISCHARGE OR ITCHING   Items with * indicate a potential emergency and should be followed up as soon as possible or go to the Emergency Department if any problems should occur.  Please show the CHEMOTHERAPY ALERT CARD or  IMMUNOTHERAPY ALERT CARD at check-in to the Emergency Department and triage nurse.  Should you have questions after your visit or need to cancel or reschedule your appointment, please contact [facility] - A DEPT OF [facility]  Dept: [phone]  and follow the prompts.  Office hours are 8:00 a.m. to 4:30 p.m. Monday - Friday. Please note that voicemails left after 4:00 p.m. may not be returned until the following business day.  We are closed weekends and major holidays. You have access to a nurse at all times for urgent questions. Please call the main number to the clinic Dept: [phone] and follow the prompts.   For any non-urgent questions, you may also contact your provider using MyChart. We now offer e-Visits for anyone 68 and older to request care online for non-urgent symptoms. For details visit [email or web address].   Also download the MyChart app! Go to the app store, search "MyChart", open the app, select [facility], and log in with your MyChart username and password.

## 2023-08-15 NOTE — Progress Notes (Signed)
Nutrition Follow-up:  Patient with multiple myeloma. He is receiving kyprolis + darzalex q28d (start 04/07/22)   Met with patient in infusion. He reports focusing on eating the last couple of weeks given 10 lb wt loss experienced last cycle. Patient is drinking 2-3 Boost. He is also drinking whole fat chocolate milk to offset cost of Boost. He has added in high protein bedtime snack. Patient recently started wearing CPAP within the last week. He is disappointed he has not noticed improved sleep quality. Patient is concerned about elevated blood pressures. He is not on medications for hypertension. Patient reports this is due to home stressors. Patient has connected with LCSW Arvid Right).  Medications: reviewed   Labs: reviewed   Anthropometrics: Wt 185 lb 4 oz today increased   12/11 - 180 lb 9.6 oz 11/13 - 190 lb 4.8 oz 11/27 - 186 lb   NUTRITION DIAGNOSIS: Unintended wt loss - stable    INTERVENTION:  Suggested reducing Boost/chocolate milk given improved wt - recommend 1-2/day Encouraged lean proteins at every meal Support and encouragement     MONITORING, EVALUATION, GOAL: wt trends, intake    NEXT VISIT: To be scheduled as needed with treatment

## 2023-08-18 ENCOUNTER — Inpatient Hospital Stay: Payer: BC Managed Care – PPO

## 2023-08-18 NOTE — Progress Notes (Signed)
[facility] CSW Counseling Note  Patient was referred by  Dietician . Treatment type: Individual  Presenting Concerns: Patient and/or family reports the following symptoms/concerns: anxiety and stress; Severity of problem: mild   Orientation:oriented to person, place, and time/date.   Affect: Congruent Risk of harm to self or others: No plan to harm self or others  Patient and/or Family's Strengths/Protective Factors: Social connections, Social and Emotional competence, Concrete supports in place (healthy food, safe environments, etc.), Sense of purpose, and Physical Health (exercise, healthy diet, medication compliance, etc.)Ability for insight  Active sense of humor  Average or above average intelligence  Capable of independent living  Arboriculturist fund of knowledge  Motivation for treatment/growth  Religious Affiliation  Supportive family/friends      Goals Addressed: Patient will:  Reduce symptoms of: stress Increase knowledge and/or ability of: coping skills  Increase healthy adjustment to current life circumstances   Progress towards Goals: Progressing   Interventions: Interventions utilized:  DBT and Strength-based      Assessment: Patient currently experiencing increased stress and anxiety related to various stressors. Patient reported increased blood pressure of the past few months, primarily attributed to familial relationships. Patient reported increased feelings of uncertainty related to health, finances and family.        Plan: Follow up with CSW: 2 Weeks Behavioral recommendations: 1. Practice Grounding increasing his ability to manage distress.        Anthony Merles, LCSW

## 2023-08-19 ENCOUNTER — Other Ambulatory Visit: Payer: Self-pay

## 2023-08-19 DIAGNOSIS — Z0189 Encounter for other specified special examinations: Secondary | ICD-10-CM | POA: Diagnosis not present

## 2023-08-19 DIAGNOSIS — E785 Hyperlipidemia, unspecified: Secondary | ICD-10-CM | POA: Diagnosis not present

## 2023-08-19 DIAGNOSIS — R030 Elevated blood-pressure reading, without diagnosis of hypertension: Secondary | ICD-10-CM | POA: Diagnosis not present

## 2023-08-19 DIAGNOSIS — G8929 Other chronic pain: Secondary | ICD-10-CM | POA: Diagnosis not present

## 2023-08-19 DIAGNOSIS — M549 Dorsalgia, unspecified: Secondary | ICD-10-CM | POA: Diagnosis not present

## 2023-08-20 ENCOUNTER — Other Ambulatory Visit: Payer: Self-pay

## 2023-08-22 ENCOUNTER — Other Ambulatory Visit: Payer: Self-pay

## 2023-08-22 DIAGNOSIS — C9000 Multiple myeloma not having achieved remission: Secondary | ICD-10-CM

## 2023-08-22 MED ORDER — FENTANYL 25 MCG/HR TD PT72: 1.0000 | MEDICATED_PATCH | TRANSDERMAL | 0 refills | Status: DC

## 2023-08-24 LAB — MULTIPLE MYELOMA PANEL, SERUM
Albumin SerPl Elph-Mcnc: 3.9 g/dL (ref 2.9–4.4)
Albumin/Glob SerPl: 1.9 — ABNORMAL HIGH (ref 0.7–1.7)
Alpha 1: 0.2 g/dL (ref 0.0–0.4)
Alpha2 Glob SerPl Elph-Mcnc: 0.5 g/dL (ref 0.4–1.0)
B-Globulin SerPl Elph-Mcnc: 0.8 g/dL (ref 0.7–1.3)
Gamma Glob SerPl Elph-Mcnc: 0.5 g/dL (ref 0.4–1.8)
Globulin, Total: 2.1 g/dL — ABNORMAL LOW (ref 2.2–3.9)
IgA: 33 mg/dL — ABNORMAL LOW (ref 61–437)
IgG (Immunoglobin G), Serum: 643 mg/dL (ref 603–1613)
IgM (Immunoglobulin M), Srm: 17 mg/dL — ABNORMAL LOW (ref 20–172)
Total Protein ELP: 6 g/dL (ref 6.0–8.5)

## 2023-08-25 DIAGNOSIS — R030 Elevated blood-pressure reading, without diagnosis of hypertension: Secondary | ICD-10-CM | POA: Diagnosis not present

## 2023-08-25 DIAGNOSIS — F339 Major depressive disorder, recurrent, unspecified: Secondary | ICD-10-CM | POA: Diagnosis not present

## 2023-08-25 DIAGNOSIS — F419 Anxiety disorder, unspecified: Secondary | ICD-10-CM | POA: Diagnosis not present

## 2023-08-25 DIAGNOSIS — C9000 Multiple myeloma not having achieved remission: Secondary | ICD-10-CM | POA: Diagnosis not present

## 2023-08-30 ENCOUNTER — Other Ambulatory Visit: Payer: Self-pay

## 2023-09-01 ENCOUNTER — Encounter: Payer: Self-pay | Admitting: Hematology

## 2023-09-01 ENCOUNTER — Other Ambulatory Visit: Payer: Self-pay

## 2023-09-01 ENCOUNTER — Inpatient Hospital Stay: Payer: BC Managed Care – PPO

## 2023-09-01 ENCOUNTER — Inpatient Hospital Stay: Payer: BC Managed Care – PPO | Attending: Physician Assistant

## 2023-09-01 VITALS — BP 136/87 | HR 62 | Temp 98.2°F | Resp 18 | Wt 183.0 lb

## 2023-09-01 DIAGNOSIS — Z79891 Long term (current) use of opiate analgesic: Secondary | ICD-10-CM | POA: Diagnosis not present

## 2023-09-01 DIAGNOSIS — Z87891 Personal history of nicotine dependence: Secondary | ICD-10-CM | POA: Insufficient documentation

## 2023-09-01 DIAGNOSIS — M545 Low back pain, unspecified: Secondary | ICD-10-CM | POA: Insufficient documentation

## 2023-09-01 DIAGNOSIS — D72819 Decreased white blood cell count, unspecified: Secondary | ICD-10-CM | POA: Diagnosis not present

## 2023-09-01 DIAGNOSIS — Z7189 Other specified counseling: Secondary | ICD-10-CM

## 2023-09-01 DIAGNOSIS — C9000 Multiple myeloma not having achieved remission: Secondary | ICD-10-CM | POA: Insufficient documentation

## 2023-09-01 DIAGNOSIS — M25511 Pain in right shoulder: Secondary | ICD-10-CM | POA: Insufficient documentation

## 2023-09-01 DIAGNOSIS — Z808 Family history of malignant neoplasm of other organs or systems: Secondary | ICD-10-CM | POA: Insufficient documentation

## 2023-09-01 DIAGNOSIS — Z5112 Encounter for antineoplastic immunotherapy: Secondary | ICD-10-CM | POA: Diagnosis not present

## 2023-09-01 DIAGNOSIS — Z809 Family history of malignant neoplasm, unspecified: Secondary | ICD-10-CM | POA: Insufficient documentation

## 2023-09-01 DIAGNOSIS — R059 Cough, unspecified: Secondary | ICD-10-CM | POA: Diagnosis not present

## 2023-09-01 DIAGNOSIS — R0602 Shortness of breath: Secondary | ICD-10-CM | POA: Diagnosis not present

## 2023-09-01 LAB — CBC WITH DIFFERENTIAL (CANCER CENTER ONLY)
Abs Immature Granulocytes: 0.04 10*3/uL (ref 0.00–0.07)
Basophils Absolute: 0 10*3/uL (ref 0.0–0.1)
Basophils Relative: 0 %
Eosinophils Absolute: 0.1 10*3/uL (ref 0.0–0.5)
Eosinophils Relative: 2 %
HCT: 45.9 % (ref 39.0–52.0)
Hemoglobin: 15.5 g/dL (ref 13.0–17.0)
Immature Granulocytes: 1 %
Lymphocytes Relative: 18 %
Lymphs Abs: 1 10*3/uL (ref 0.7–4.0)
MCH: 28.5 pg (ref 26.0–34.0)
MCHC: 33.8 g/dL (ref 30.0–36.0)
MCV: 84.5 fL (ref 80.0–100.0)
Monocytes Absolute: 0.8 10*3/uL (ref 0.1–1.0)
Monocytes Relative: 14 %
Neutro Abs: 3.7 10*3/uL (ref 1.7–7.7)
Neutrophils Relative %: 65 %
Platelet Count: 207 10*3/uL (ref 150–400)
RBC: 5.43 MIL/uL (ref 4.22–5.81)
RDW: 14.6 % (ref 11.5–15.5)
WBC Count: 5.7 10*3/uL (ref 4.0–10.5)
nRBC: 0 % (ref 0.0–0.2)

## 2023-09-01 LAB — CMP (CANCER CENTER ONLY)
ALT: 11 U/L (ref 0–44)
AST: 12 U/L — ABNORMAL LOW (ref 15–41)
Albumin: 4.4 g/dL (ref 3.5–5.0)
Alkaline Phosphatase: 41 U/L (ref 38–126)
Anion gap: 6 (ref 5–15)
BUN: 15 mg/dL (ref 8–23)
CO2: 26 mmol/L (ref 22–32)
Calcium: 9.5 mg/dL (ref 8.9–10.3)
Chloride: 102 mmol/L (ref 98–111)
Creatinine: 0.71 mg/dL (ref 0.61–1.24)
GFR, Estimated: >60 mL/min (ref >60)
Glucose, Bld: 98 mg/dL (ref 70–99)
Potassium: 4.2 mmol/L (ref 3.5–5.1)
Sodium: 134 mmol/L — ABNORMAL LOW (ref 135–145)
Total Bilirubin: 0.8 mg/dL (ref 0.0–1.2)
Total Protein: 6.5 g/dL (ref 6.5–8.1)

## 2023-09-01 MED ORDER — PALONOSETRON HCL INJECTION 0.25 MG/5ML
0.2500 mg | Freq: Once | INTRAVENOUS | Status: AC
  Administered 2023-09-01: 0.25 mg via INTRAVENOUS
  Filled 2023-09-01: qty 5

## 2023-09-01 MED ORDER — REVLIMID 15 MG PO CAPS: ORAL_CAPSULE | ORAL | 0 refills | Status: DC

## 2023-09-01 MED ORDER — ZOLEDRONIC ACID 4 MG/100ML IV SOLN
4.0000 mg | Freq: Once | INTRAVENOUS | Status: AC
  Administered 2023-09-01: 4 mg via INTRAVENOUS
  Filled 2023-09-01: qty 100

## 2023-09-01 MED ORDER — PROCHLORPERAZINE MALEATE 10 MG PO TABS
10.0000 mg | ORAL_TABLET | Freq: Once | ORAL | Status: AC
Start: 2023-09-01 — End: 2023-09-01
  Administered 2023-09-01: 10 mg via ORAL
  Filled 2023-09-01: qty 1

## 2023-09-01 MED ORDER — DEXAMETHASONE SODIUM PHOSPHATE 10 MG/ML IJ SOLN
8.0000 mg | Freq: Once | INTRAMUSCULAR | Status: AC
  Administered 2023-09-01: 8 mg via INTRAVENOUS
  Filled 2023-09-01: qty 1

## 2023-09-01 MED ORDER — SODIUM CHLORIDE 0.9 % IV SOLN: Freq: Once | INTRAVENOUS | Status: AC

## 2023-09-01 MED ORDER — DEXTROSE 5 % IV SOLN
70.0000 mg/m2 | Freq: Once | INTRAVENOUS | Status: AC
  Administered 2023-09-01: 140 mg via INTRAVENOUS
  Filled 2023-09-01: qty 60

## 2023-09-01 NOTE — Progress Notes (Signed)
 [facility] CSW Counseling Note  Patient was referred by  Dietician . Treatment type: Individual  Presenting Concerns: Patient and/or family reports the following symptoms/concerns: anxiety and stress; Severity of problem: mild   Orientation:oriented to person, place, and time/date.   Affect: Congruent Risk of harm to self or others: No plan to harm self or others  Patient and/or Family's Strengths/Protective Factors: Social connections, Social and Emotional competence, Concrete supports in place (healthy food, safe environments, etc.), Sense of purpose, and Physical Health (exercise, healthy diet, medication compliance, etc.)Ability for insight  Active sense of humor  Average or above average intelligence  Capable of independent living  Arboriculturist fund of knowledge  Motivation for treatment/growth  Religious Affiliation  Supportive family/friends      Goals Addressed: Patient will:  Reduce symptoms of: stress Increase knowledge and/or ability of: coping skills  Increase healthy adjustment to current life circumstances   Progress towards Goals: Progressing   Interventions: Interventions utilized:  Solution Focused , DBT, Strength-Based, Psycho education   Assessment: Patient currently experiencing increased stress and anxiety related to various stressors. Patient reported stress level as slightly better and slight improvement with blood pressure, however still identifies it as a concern. Patient has reported stress-induced Anxiety that is leading to Depression, recently prescribed Lexapro (started on 1/08). Patient identified various stressors that have been impacting mood and strategies for management (patient has been using grounding techniques with some success. CSW provided Psychoeducation on Anxiety / Depression and Techniques that can help. Patient and CSW discussed impact of ongoing treatment on mood.     Plan: Follow up with CSW: 2  Weeks: 09/15/23 at 10:00 AM Behavioral recommendations: 1. Apply grounding techniques (3 Senses, Cold Showers, Ice in hand, sour candy, grounding statements) in order to reduce psychological responses.  2. Identify what current stressors are in / out of your control. 3. Attempt to identify tasks that you are able to do in an effort to not focus on what you aren't able to do (short walking, house tasks in small burst). 4. Remind yourself of the things you are doing well.     Lizbeth Sprague, LCSW

## 2023-09-01 NOTE — Patient Instructions (Signed)

## 2023-09-05 ENCOUNTER — Other Ambulatory Visit: Payer: Self-pay

## 2023-09-08 DIAGNOSIS — G4733 Obstructive sleep apnea (adult) (pediatric): Secondary | ICD-10-CM | POA: Diagnosis not present

## 2023-09-08 LAB — MULTIPLE MYELOMA PANEL, SERUM
Albumin SerPl Elph-Mcnc: 4.2 g/dL (ref 2.9–4.4)
Albumin/Glob SerPl: 2 — ABNORMAL HIGH (ref 0.7–1.7)
Alpha 1: 0.2 g/dL (ref 0.0–0.4)
Alpha2 Glob SerPl Elph-Mcnc: 0.5 g/dL (ref 0.4–1.0)
B-Globulin SerPl Elph-Mcnc: 0.9 g/dL (ref 0.7–1.3)
Gamma Glob SerPl Elph-Mcnc: 0.6 g/dL (ref 0.4–1.8)
Globulin, Total: 2.2 g/dL (ref 2.2–3.9)
IgA: 30 mg/dL — ABNORMAL LOW (ref 61–437)
IgG (Immunoglobin G), Serum: 627 mg/dL (ref 603–1613)
IgM (Immunoglobulin M), Srm: 17 mg/dL — ABNORMAL LOW (ref 20–172)
Total Protein ELP: 6.4 g/dL (ref 6.0–8.5)

## 2023-09-11 ENCOUNTER — Other Ambulatory Visit: Payer: Self-pay

## 2023-09-11 DIAGNOSIS — C9000 Multiple myeloma not having achieved remission: Secondary | ICD-10-CM

## 2023-09-11 DIAGNOSIS — Z7189 Other specified counseling: Secondary | ICD-10-CM

## 2023-09-12 ENCOUNTER — Encounter: Payer: Self-pay | Admitting: Hematology

## 2023-09-12 MED ORDER — LORAZEPAM 0.5 MG PO TABS: 0.5000 mg | ORAL_TABLET | Freq: Four times a day (QID) | ORAL | 0 refills | Status: DC | PRN

## 2023-09-15 ENCOUNTER — Inpatient Hospital Stay: Payer: BC Managed Care – PPO

## 2023-09-15 NOTE — Progress Notes (Signed)
[facility] CSW Counseling Note  Patient was referred by  Dietician . Treatment type: Individual  Presenting Concerns: Patient and/or family reports the following symptoms/concerns: anxiety and stress; Severity of problem: mild   Orientation:oriented to person, place, and time/date.   Affect: Congruent Risk of harm to self or others: No plan to harm self or others  Patient and/or Family's Strengths/Protective Factors: Social connections, Social and Emotional competence, Concrete supports in place (healthy food, safe environments, etc.), Sense of purpose, and Physical Health (exercise, healthy diet, medication compliance, etc.)Ability for insight  Active sense of humor  Average or above average intelligence  Capable of independent living  Arboriculturist fund of knowledge  Motivation for treatment/growth  Religious Affiliation  Supportive family/friends      Goals Addressed: Patient will:  Reduce symptoms of: stress Increase knowledge and/or ability of: coping skills  Increase healthy adjustment to current life circumstances   Progress towards Goals: Progressing   Interventions: Interventions utilized:  Solution Focused , DBT, Strength-Based, Psycho education   Assessment: Patient reported improved stress, depression, and anxiety levels since last session. Patient attributed stress relief to solution based actions for reported stressor, improved tolerance of changes in medical care, and continued boundaries put in place for family member. CSW and patient discussed strengths and skills patient has been able to apply.     Plan: Follow up with CSW: 3 Weeks: 10/06/23 at 10:00 AM Behavioral recommendations: Continue to apply grounding techniques for physical responses to anxiety, focusing on is in your control and out of control for situations that bring up stress w/ no immediate solutions.     Marguerita Merles, LCSW

## 2023-09-16 ENCOUNTER — Inpatient Hospital Stay: Payer: BC Managed Care – PPO

## 2023-09-16 VITALS — BP 151/81 | HR 65 | Temp 97.8°F | Resp 18 | Wt 186.8 lb

## 2023-09-16 DIAGNOSIS — Z7189 Other specified counseling: Secondary | ICD-10-CM

## 2023-09-16 DIAGNOSIS — C9000 Multiple myeloma not having achieved remission: Secondary | ICD-10-CM

## 2023-09-16 DIAGNOSIS — R059 Cough, unspecified: Secondary | ICD-10-CM | POA: Diagnosis not present

## 2023-09-16 DIAGNOSIS — M25511 Pain in right shoulder: Secondary | ICD-10-CM | POA: Diagnosis not present

## 2023-09-16 DIAGNOSIS — R0602 Shortness of breath: Secondary | ICD-10-CM | POA: Diagnosis not present

## 2023-09-16 DIAGNOSIS — Z809 Family history of malignant neoplasm, unspecified: Secondary | ICD-10-CM | POA: Diagnosis not present

## 2023-09-16 DIAGNOSIS — Z87891 Personal history of nicotine dependence: Secondary | ICD-10-CM | POA: Diagnosis not present

## 2023-09-16 DIAGNOSIS — Z79891 Long term (current) use of opiate analgesic: Secondary | ICD-10-CM | POA: Diagnosis not present

## 2023-09-16 DIAGNOSIS — Z5112 Encounter for antineoplastic immunotherapy: Secondary | ICD-10-CM | POA: Diagnosis not present

## 2023-09-16 DIAGNOSIS — Z808 Family history of malignant neoplasm of other organs or systems: Secondary | ICD-10-CM | POA: Diagnosis not present

## 2023-09-16 DIAGNOSIS — D72819 Decreased white blood cell count, unspecified: Secondary | ICD-10-CM | POA: Diagnosis not present

## 2023-09-16 DIAGNOSIS — M545 Low back pain, unspecified: Secondary | ICD-10-CM | POA: Diagnosis not present

## 2023-09-16 LAB — CMP (CANCER CENTER ONLY)
ALT: 12 U/L (ref 0–44)
AST: 12 U/L — ABNORMAL LOW (ref 15–41)
Albumin: 4.1 g/dL (ref 3.5–5.0)
Alkaline Phosphatase: 45 U/L (ref 38–126)
Anion gap: 7 (ref 5–15)
BUN: 16 mg/dL (ref 8–23)
CO2: 29 mmol/L (ref 22–32)
Calcium: 9.3 mg/dL (ref 8.9–10.3)
Chloride: 101 mmol/L (ref 98–111)
Creatinine: 0.83 mg/dL (ref 0.61–1.24)
GFR, Estimated: >60 mL/min (ref >60)
Glucose, Bld: 94 mg/dL (ref 70–99)
Potassium: 4 mmol/L (ref 3.5–5.1)
Sodium: 137 mmol/L (ref 135–145)
Total Bilirubin: 0.7 mg/dL (ref 0.0–1.2)
Total Protein: 6.3 g/dL — ABNORMAL LOW (ref 6.5–8.1)

## 2023-09-16 LAB — CBC WITH DIFFERENTIAL (CANCER CENTER ONLY)
Abs Immature Granulocytes: 0.01 10*3/uL (ref 0.00–0.07)
Basophils Absolute: 0 10*3/uL (ref 0.0–0.1)
Basophils Relative: 1 %
Eosinophils Absolute: 0 10*3/uL (ref 0.0–0.5)
Eosinophils Relative: 1 %
HCT: 44.1 % (ref 39.0–52.0)
Hemoglobin: 14.8 g/dL (ref 13.0–17.0)
Immature Granulocytes: 0 %
Lymphocytes Relative: 24 %
Lymphs Abs: 0.8 10*3/uL (ref 0.7–4.0)
MCH: 28.4 pg (ref 26.0–34.0)
MCHC: 33.6 g/dL (ref 30.0–36.0)
MCV: 84.6 fL (ref 80.0–100.0)
Monocytes Absolute: 0.4 10*3/uL (ref 0.1–1.0)
Monocytes Relative: 12 %
Neutro Abs: 2.2 10*3/uL (ref 1.7–7.7)
Neutrophils Relative %: 62 %
Platelet Count: 153 10*3/uL (ref 150–400)
RBC: 5.21 MIL/uL (ref 4.22–5.81)
RDW: 14.4 % (ref 11.5–15.5)
WBC Count: 3.5 10*3/uL — ABNORMAL LOW (ref 4.0–10.5)
nRBC: 0 % (ref 0.0–0.2)

## 2023-09-16 MED ORDER — ACETAMINOPHEN 325 MG PO TABS
650.0000 mg | ORAL_TABLET | Freq: Once | ORAL | Status: AC
  Administered 2023-09-16: 650 mg via ORAL
  Filled 2023-09-16: qty 2

## 2023-09-16 MED ORDER — SODIUM CHLORIDE 0.9 % IV SOLN: Freq: Once | INTRAVENOUS | Status: AC

## 2023-09-16 MED ORDER — DEXAMETHASONE SODIUM PHOSPHATE 10 MG/ML IJ SOLN
8.0000 mg | Freq: Once | INTRAMUSCULAR | Status: AC
Start: 2023-09-16 — End: 2023-09-16
  Administered 2023-09-16: 8 mg via INTRAVENOUS
  Filled 2023-09-16: qty 1

## 2023-09-16 MED ORDER — DEXTROSE 5 % IV SOLN
70.0000 mg/m2 | Freq: Once | INTRAVENOUS | Status: AC
  Administered 2023-09-16: 140 mg via INTRAVENOUS
  Filled 2023-09-16: qty 60

## 2023-09-16 MED ORDER — PALONOSETRON HCL INJECTION 0.25 MG/5ML
0.2500 mg | Freq: Once | INTRAVENOUS | Status: AC
  Administered 2023-09-16: 0.25 mg via INTRAVENOUS
  Filled 2023-09-16: qty 5

## 2023-09-16 NOTE — Patient Instructions (Signed)
[facility]  Discharge Instructions: Thank you for choosing [facility] to provide your oncology and hematology care.   If you have a lab appointment with the Cancer Center, please go directly to the Cancer Center and check in at the registration area.   Wear comfortable clothing and clothing appropriate for easy access to any Portacath or PICC line.   We strive to give you quality time with your provider. You may need to reschedule your appointment if you arrive late (15 or more minutes).  Arriving late affects you and other patients whose appointments are after yours.  Also, if you miss three or more appointments without notifying the office, you may be dismissed from the clinic at the provider's discretion.      For prescription refill requests, have your pharmacy contact our office and allow 72 hours for refills to be completed.    Today you received the following chemotherapy and/or immunotherapy agents: Kyprolis.       To help prevent nausea and vomiting after your treatment, we encourage you to take your nausea medication as directed.  BELOW ARE SYMPTOMS THAT SHOULD BE REPORTED IMMEDIATELY: *FEVER GREATER THAN 100.4 F (38 C) OR HIGHER *CHILLS OR SWEATING *NAUSEA AND VOMITING THAT IS NOT CONTROLLED WITH YOUR NAUSEA MEDICATION *UNUSUAL SHORTNESS OF BREATH *UNUSUAL BRUISING OR BLEEDING *URINARY PROBLEMS (pain or burning when urinating, or frequent urination) *BOWEL PROBLEMS (unusual diarrhea, constipation, pain near the anus) TENDERNESS IN MOUTH AND THROAT WITH OR WITHOUT PRESENCE OF ULCERS (sore throat, sores in mouth, or a toothache) UNUSUAL RASH, SWELLING OR PAIN  UNUSUAL VAGINAL DISCHARGE OR ITCHING   Items with * indicate a potential emergency and should be followed up as soon as possible or go to the Emergency Department if any problems should occur.  Please show the CHEMOTHERAPY ALERT CARD or IMMUNOTHERAPY  ALERT CARD at check-in to the Emergency Department and triage nurse.  Should you have questions after your visit or need to cancel or reschedule your appointment, please contact [facility]  Dept: [phone]  and follow the prompts.  Office hours are 8:00 a.m. to 4:30 p.m. Monday - Friday. Please note that voicemails left after 4:00 p.m. may not be returned until the following business day.  We are closed weekends and major holidays. You have access to a nurse at all times for urgent questions. Please call the main number to the clinic Dept: [phone] and follow the prompts.   For any non-urgent questions, you may also contact your provider using MyChart. We now offer e-Visits for anyone 45 and older to request care online for non-urgent symptoms. For details visit [email or web address].   Also download the MyChart app! Go to the app store, search "MyChart", open the app, select [facility], and log in with your MyChart username and password.

## 2023-09-19 ENCOUNTER — Other Ambulatory Visit: Payer: Self-pay

## 2023-09-19 DIAGNOSIS — C9000 Multiple myeloma not having achieved remission: Secondary | ICD-10-CM

## 2023-09-19 LAB — MULTIPLE MYELOMA PANEL, SERUM
Albumin SerPl Elph-Mcnc: 3.7 g/dL (ref 2.9–4.4)
Albumin/Glob SerPl: 1.8 — ABNORMAL HIGH (ref 0.7–1.7)
Alpha 1: 0.2 g/dL (ref 0.0–0.4)
Alpha2 Glob SerPl Elph-Mcnc: 0.6 g/dL (ref 0.4–1.0)
B-Globulin SerPl Elph-Mcnc: 0.8 g/dL (ref 0.7–1.3)
Gamma Glob SerPl Elph-Mcnc: 0.5 g/dL (ref 0.4–1.8)
Globulin, Total: 2.1 g/dL — ABNORMAL LOW (ref 2.2–3.9)
IgA: 32 mg/dL — ABNORMAL LOW (ref 61–437)
IgG (Immunoglobin G), Serum: 619 mg/dL (ref 603–1613)
IgM (Immunoglobulin M), Srm: 19 mg/dL — ABNORMAL LOW (ref 20–172)
Total Protein ELP: 5.8 g/dL — ABNORMAL LOW (ref 6.0–8.5)

## 2023-09-19 MED ORDER — FENTANYL 25 MCG/HR TD PT72: 1.0000 | MEDICATED_PATCH | TRANSDERMAL | 0 refills | Status: DC

## 2023-09-22 DIAGNOSIS — G4733 Obstructive sleep apnea (adult) (pediatric): Secondary | ICD-10-CM | POA: Diagnosis not present

## 2023-09-24 ENCOUNTER — Other Ambulatory Visit: Payer: Self-pay

## 2023-09-30 ENCOUNTER — Inpatient Hospital Stay: Payer: BC Managed Care – PPO

## 2023-09-30 ENCOUNTER — Inpatient Hospital Stay (HOSPITAL_BASED_OUTPATIENT_CLINIC_OR_DEPARTMENT_OTHER): Payer: BC Managed Care – PPO | Admitting: Hematology

## 2023-09-30 ENCOUNTER — Inpatient Hospital Stay: Payer: BC Managed Care – PPO | Attending: Physician Assistant

## 2023-09-30 VITALS — BP 132/76 | HR 62 | Temp 97.7°F | Resp 18 | Wt 185.7 lb

## 2023-09-30 DIAGNOSIS — Z809 Family history of malignant neoplasm, unspecified: Secondary | ICD-10-CM | POA: Diagnosis not present

## 2023-09-30 DIAGNOSIS — G4730 Sleep apnea, unspecified: Secondary | ICD-10-CM | POA: Diagnosis not present

## 2023-09-30 DIAGNOSIS — Z7189 Other specified counseling: Secondary | ICD-10-CM

## 2023-09-30 DIAGNOSIS — M545 Low back pain, unspecified: Secondary | ICD-10-CM | POA: Insufficient documentation

## 2023-09-30 DIAGNOSIS — Z79891 Long term (current) use of opiate analgesic: Secondary | ICD-10-CM | POA: Insufficient documentation

## 2023-09-30 DIAGNOSIS — M25511 Pain in right shoulder: Secondary | ICD-10-CM | POA: Diagnosis not present

## 2023-09-30 DIAGNOSIS — Z808 Family history of malignant neoplasm of other organs or systems: Secondary | ICD-10-CM | POA: Insufficient documentation

## 2023-09-30 DIAGNOSIS — R0602 Shortness of breath: Secondary | ICD-10-CM | POA: Insufficient documentation

## 2023-09-30 DIAGNOSIS — Z87891 Personal history of nicotine dependence: Secondary | ICD-10-CM | POA: Diagnosis not present

## 2023-09-30 DIAGNOSIS — C9000 Multiple myeloma not having achieved remission: Secondary | ICD-10-CM | POA: Insufficient documentation

## 2023-09-30 DIAGNOSIS — Z5112 Encounter for antineoplastic immunotherapy: Secondary | ICD-10-CM | POA: Diagnosis not present

## 2023-09-30 DIAGNOSIS — R197 Diarrhea, unspecified: Secondary | ICD-10-CM | POA: Insufficient documentation

## 2023-09-30 DIAGNOSIS — R059 Cough, unspecified: Secondary | ICD-10-CM | POA: Diagnosis not present

## 2023-09-30 DIAGNOSIS — R110 Nausea: Secondary | ICD-10-CM | POA: Insufficient documentation

## 2023-09-30 DIAGNOSIS — R5383 Other fatigue: Secondary | ICD-10-CM | POA: Insufficient documentation

## 2023-09-30 LAB — CBC WITH DIFFERENTIAL (CANCER CENTER ONLY)
Abs Immature Granulocytes: 0.03 10*3/uL (ref 0.00–0.07)
Basophils Absolute: 0 10*3/uL (ref 0.0–0.1)
Basophils Relative: 1 %
Eosinophils Absolute: 0.1 10*3/uL (ref 0.0–0.5)
Eosinophils Relative: 2 %
HCT: 43.9 % (ref 39.0–52.0)
Hemoglobin: 14.5 g/dL (ref 13.0–17.0)
Immature Granulocytes: 1 %
Lymphocytes Relative: 27 %
Lymphs Abs: 1.4 10*3/uL (ref 0.7–4.0)
MCH: 28.2 pg (ref 26.0–34.0)
MCHC: 33 g/dL (ref 30.0–36.0)
MCV: 85.4 fL (ref 80.0–100.0)
Monocytes Absolute: 0.8 10*3/uL (ref 0.1–1.0)
Monocytes Relative: 16 %
Neutro Abs: 2.9 10*3/uL (ref 1.7–7.7)
Neutrophils Relative %: 53 %
Platelet Count: 168 10*3/uL (ref 150–400)
RBC: 5.14 MIL/uL (ref 4.22–5.81)
RDW: 14.2 % (ref 11.5–15.5)
WBC Count: 5.3 10*3/uL (ref 4.0–10.5)
nRBC: 0 % (ref 0.0–0.2)

## 2023-09-30 LAB — CMP (CANCER CENTER ONLY)
ALT: 12 U/L (ref 0–44)
AST: 12 U/L — ABNORMAL LOW (ref 15–41)
Albumin: 4.3 g/dL (ref 3.5–5.0)
Alkaline Phosphatase: 44 U/L (ref 38–126)
Anion gap: 6 (ref 5–15)
BUN: 9 mg/dL (ref 8–23)
CO2: 26 mmol/L (ref 22–32)
Calcium: 8.8 mg/dL — ABNORMAL LOW (ref 8.9–10.3)
Chloride: 105 mmol/L (ref 98–111)
Creatinine: 0.87 mg/dL (ref 0.61–1.24)
GFR, Estimated: >60 mL/min (ref >60)
Glucose, Bld: 77 mg/dL (ref 70–99)
Potassium: 3.7 mmol/L (ref 3.5–5.1)
Sodium: 137 mmol/L (ref 135–145)
Total Bilirubin: 0.9 mg/dL (ref 0.0–1.2)
Total Protein: 6.4 g/dL — ABNORMAL LOW (ref 6.5–8.1)

## 2023-09-30 MED ORDER — ZOLEDRONIC ACID 4 MG/100ML IV SOLN
4.0000 mg | Freq: Once | INTRAVENOUS | Status: AC
  Administered 2023-09-30: 4 mg via INTRAVENOUS
  Filled 2023-09-30: qty 100

## 2023-09-30 MED ORDER — PALONOSETRON HCL INJECTION 0.25 MG/5ML
0.2500 mg | Freq: Once | INTRAVENOUS | Status: AC
  Administered 2023-09-30: 0.25 mg via INTRAVENOUS
  Filled 2023-09-30: qty 5

## 2023-09-30 MED ORDER — SODIUM CHLORIDE 0.9 % IV SOLN: Freq: Once | INTRAVENOUS | Status: AC

## 2023-09-30 MED ORDER — DEXTROSE 5 % IV SOLN
70.0000 mg/m2 | Freq: Once | INTRAVENOUS | Status: AC
  Administered 2023-09-30: 140 mg via INTRAVENOUS
  Filled 2023-09-30: qty 60

## 2023-09-30 MED ORDER — DEXAMETHASONE SODIUM PHOSPHATE 10 MG/ML IJ SOLN
8.0000 mg | Freq: Once | INTRAMUSCULAR | Status: AC
  Administered 2023-09-30: 8 mg via INTRAVENOUS
  Filled 2023-09-30: qty 1

## 2023-09-30 NOTE — Progress Notes (Signed)
Per Dr. Candise Che, OK to administer Zometa today with corrected  calcium 8.56.

## 2023-09-30 NOTE — Progress Notes (Signed)
Patient seen by Dr. Addison Naegeli are within treatment parameters.  Labs reviewed: and are within treatment parameters.  Per physician team, patient is ready for treatment and there are NO modifications to the treatment plan.

## 2023-09-30 NOTE — Patient Instructions (Signed)
[facility]  Discharge Instructions: Thank you for choosing [facility] to provide your oncology and hematology care.   If you have a lab appointment with the Cancer Center, please go directly to the Cancer Center and check in at the registration area.   Wear comfortable clothing and clothing appropriate for easy access to any Portacath or PICC line.   We strive to give you quality time with your provider. You may need to reschedule your appointment if you arrive late (15 or more minutes).  Arriving late affects you and other patients whose appointments are after yours.  Also, if you miss three or more appointments without notifying the office, you may be dismissed from the clinic at the provider's discretion.      For prescription refill requests, have your pharmacy contact our office and allow 72 hours for refills to be completed.    Today you received the following chemotherapy and/or immunotherapy agents: Kyprolis      To help prevent nausea and vomiting after your treatment, we encourage you to take your nausea medication as directed.  BELOW ARE SYMPTOMS THAT SHOULD BE REPORTED IMMEDIATELY: *FEVER GREATER THAN 100.4 F (38 C) OR HIGHER *CHILLS OR SWEATING *NAUSEA AND VOMITING THAT IS NOT CONTROLLED WITH YOUR NAUSEA MEDICATION *UNUSUAL SHORTNESS OF BREATH *UNUSUAL BRUISING OR BLEEDING *URINARY PROBLEMS (pain or burning when urinating, or frequent urination) *BOWEL PROBLEMS (unusual diarrhea, constipation, pain near the anus) TENDERNESS IN MOUTH AND THROAT WITH OR WITHOUT PRESENCE OF ULCERS (sore throat, sores in mouth, or a toothache) UNUSUAL RASH, SWELLING OR PAIN  UNUSUAL VAGINAL DISCHARGE OR ITCHING   Items with * indicate a potential emergency and should be followed up as soon as possible or go to the Emergency Department if any problems should occur.  Please show the CHEMOTHERAPY ALERT CARD or IMMUNOTHERAPY  ALERT CARD at check-in to the Emergency Department and triage nurse.  Should you have questions after your visit or need to cancel or reschedule your appointment, please contact [facility]  Dept: [phone]  and follow the prompts.  Office hours are 8:00 a.m. to 4:30 p.m. Monday - Friday. Please note that voicemails left after 4:00 p.m. may not be returned until the following business day.  We are closed weekends and major holidays. You have access to a nurse at all times for urgent questions. Please call the main number to the clinic Dept: [phone] and follow the prompts.   For any non-urgent questions, you may also contact your provider using MyChart. We now offer e-Visits for anyone 25 and older to request care online for non-urgent symptoms. For details visit [email or web address].   Also download the MyChart app! Go to the app store, search "MyChart", open the app, select [facility], and log in with your MyChart username and password.  Zoledronic Acid Injection (Cancer) What is this medication? ZOLEDRONIC ACID (ZOE le dron ik AS id) treats high calcium levels in the blood caused by cancer. It may also be used with chemotherapy to treat weakened bones caused by cancer. It works by slowing down the release of calcium from bones. This lowers calcium levels in your blood. It also makes your bones stronger and less likely to break (fracture). It belongs to a group of medications called bisphosphonates. This medicine may be used for other purposes; ask your health care provider  or pharmacist if you have questions. COMMON BRAND NAME(S): Zometa, Zometa Powder What should I tell my care team before I take this medication? They need to know if you have any of these conditions: Dehydration Dental disease Kidney disease Liver disease Low levels of calcium in the blood Lung or breathing disease, such as asthma Receiving steroids, such as  dexamethasone or prednisone An unusual or allergic reaction to zoledronic acid, other medications, foods, dyes, or preservatives Pregnant or trying to get pregnant Breast-feeding How should I use this medication? This medication is injected into a vein. It is given by your care team in a hospital or clinic setting. Talk to your care team about the use of this medication in children. Special care may be needed. Overdosage: If you think you have taken too much of this medicine contact a poison control center or emergency room at once. NOTE: This medicine is only for you. Do not share this medicine with others. What if I miss a dose? Keep appointments for follow-up doses. It is important not to miss your dose. Call your care team if you are unable to keep an appointment. What may interact with this medication? Certain antibiotics given by injection Diuretics, such as bumetanide, furosemide NSAIDs, medications for pain and inflammation, such as ibuprofen or naproxen Teriparatide Thalidomide This list may not describe all possible interactions. Give your health care provider a list of all the medicines, herbs, non-prescription drugs, or dietary supplements you use. Also tell them if you smoke, drink alcohol, or use illegal drugs. Some items may interact with your medicine. What should I watch for while using this medication? Visit your care team for regular checks on your progress. It may be some time before you see the benefit from this medication. Some people who take this medication have severe bone, joint, or muscle pain. This medication may also increase your risk for jaw problems or a broken thigh bone. Tell your care team right away if you have severe pain in your jaw, bones, joints, or muscles. Tell you care team if you have any pain that does not go away or that gets worse. Tell your dentist and dental surgeon that you are taking this medication. You should not have major dental surgery  while on this medication. See your dentist to have a dental exam and fix any dental problems before starting this medication. Take good care of your teeth while on this medication. Make sure you see your dentist for regular follow-up appointments. You should make sure you get enough calcium and vitamin D while you are taking this medication. Discuss the foods you eat and the vitamins you take with your care team. Check with your care team if you have severe diarrhea, nausea, and vomiting, or if you sweat a lot. The loss of too much body fluid may make it dangerous for you to take this medication. You may need bloodwork while taking this medication. Talk to your care team if you wish to become pregnant or think you might be pregnant. This medication can cause serious birth defects. What side effects may I notice from receiving this medication? Side effects that you should report to your care team as soon as possible: Allergic reactions--skin rash, itching, hives, swelling of the face, lips, tongue, or throat Kidney injury--decrease in the amount of urine, swelling of the ankles, hands, or feet Low calcium level--muscle pain or cramps, confusion, tingling, or numbness in the hands or feet Osteonecrosis of the jaw--pain, swelling, or redness  in the mouth, numbness of the jaw, poor healing after dental work, unusual discharge from the mouth, visible bones in the mouth Severe bone, joint, or muscle pain Side effects that usually do not require medical attention (report to your care team if they continue or are bothersome): Constipation Fatigue Fever Loss of appetite Nausea Stomach pain This list may not describe all possible side effects. Call your doctor for medical advice about side effects. You may report side effects to FDA at 1-800-FDA-1088. Where should I keep my medication? This medication is given in a hospital or clinic. It will not be stored at home. NOTE: This sheet is a summary. It may  not cover all possible information. If you have questions about this medicine, talk to your doctor, pharmacist, or health care provider.  2024 [employer]vier/Gold Standard (2021-09-28 00:00:00)

## 2023-09-30 NOTE — Progress Notes (Signed)
HEMATOLOGY/ONCOLOGY CLINIC NOTE  Date of Service: 09/30/23   Patient Care Team: Milus Height, [place] as PCP - General (Nurse Practitioner)  CHIEF COMPLAINTS/PURPOSE OF CONSULTATION:  Follow-up for continued evaluation and management of multiple myeloma  HISTORY OF PRESENTING ILLNESS:   MICHALE [name] is a wonderful 63 y.o. male who has been referred to [place] by Dr Milus Height, PA for evaluation and management of newly diagnosed multiple myeloma. He reports He is doing well.  He reports persistent lower back pain that he has had since he was in his 20's. He notes no previous significant injuries. He further notes that he gets chiropractic adjustments and takes Tylenol to manage his symptoms. He notes less back pain when standing up on his right leg and maintaining weight on his right leg.  He reports previous history of smoking and chewing tobacco over 40 years ago.  He had a recent fall back in February of this year with only a pulled muscle. And he notes another fall when chasing his cat where he fell on his right shoulder. He reports pain in right shoulder.   He had a recent COVID-19 infection back in March.  He reports intermittent cough with SOB. He notes he takes 2 Rolaids at night. We discussed potentially trying antacids which he is agreeable to as he will begin steroids soon and was advised of the possible symptoms and side effects from taking steroids.  We discussed CRAB criteria and that he meets at least two of the criterion being  anemia and bone disease.  We discussed getting additional scans for further evaluation which he is agreeable to.  We further discussed starting Daratumumab/Velcade/Dexamethasone/Zometa and Revlimid for treatment which he is agreeable to. We also discussed starting steroids before treatment and taking an acid suppressant which he was also agreeable to.  We discussed getting Senna and taking it as needed for constipation.  Labs done today  were reviewed in detail.  We discussed his recent bone marrow biopsy and aspiration done 02/06/2022.  We discussed PET/CT scan done 01/28/2022.  We discussed CT right shoulder w/o contrast done 11/13/2021  INTERVAL HISTORY:  Anthony [name] is a 63 y.o. male, is here for continued evaluation of and management of multiple myeloma. Patient is here for toxicity check prior to receiving cycle 19 day 15 of his Carfilzomib/Revimid/Dexamethasone treatment.  Patient was last seen by me on 07/30/2023 and he complained of mild skin rash on his face, but was doing well overall.   Patient notes he has been doing fairly well since our last visit. He reports he was depressed/anxious end of December and beginning of January. He went to his PCP who prescribed him Lexapro, which helped him with anxiety. However, he discontinued Lexapro since it was causing him brain fogs. Today, he denies being depressed or anxious for the past month.   He denies any new infection issues, fever, chills, night sweats, unexpected weight loss, chest pain, abdominal pain.   He does complain of back pain, low-grade intermittent nausea, mild bilateral leg swelling, grade-1 diarrhea, and fatigue. Patient notes that his back pain has improved since our last visit.   Patient complains of soreness near his right face (near right ear). He also complains of mild bilateral leg skin irritation, denies skin rashes.   Patient notes he has been diagnosed with sleep apnea and he uses CPAP machine.   He denies taking Oxycodone for the past 1-2 months.   Patient notes he has been tolerating  his treatment well without any new or severe toxicities.   Patient notes that he might switch facility to [place] due to insurance issues, he will let us know.   MEDICAL HISTORY:  Past Medical History:  Diagnosis Date   Back pain    Cancer (HCC)    Multiple myeloma (HCC) 02/15/2022    SURGICAL HISTORY: Past Surgical History:  Procedure Laterality  Date   IR BONE TUMOR(S)RF ABLATION  05/07/2022   IR BONE TUMOR(S)RF ABLATION  05/07/2022   IR BONE TUMOR(S)RF ABLATION  05/07/2022   IR KYPHO EA ADDL LEVEL THORACIC OR LUMBAR  05/07/2022   IR KYPHO LUMBAR INC FX REDUCE BONE BX UNI/BIL CANNULATION INC/IMAGING  05/07/2022   IR KYPHO THORACIC WITH BONE BIOPSY  05/07/2022   IR RADIOLOGIST EVAL & MGMT  04/23/2022   IR RADIOLOGIST EVAL & MGMT  05/17/2022   IR RADIOLOGIST EVAL & MGMT  06/06/2022    SOCIAL HISTORY: Social History   Socioeconomic History   Marital status: Married    Spouse name: Not on file   Number of children: Not on file   Years of education: Not on file   Highest education level: Not on file  Occupational History   Not on file  Tobacco Use   Smoking status: Never   Smokeless tobacco: Former    Types: Chew, Snuff  Vaping Use   Vaping status: Never Used  Substance and Sexual Activity   Alcohol use: Yes    Alcohol/week: 2.0 standard drinks of alcohol    Types: 2 Glasses of wine per week   Drug use: No   Sexual activity: Yes  Other Topics Concern   Not on file  Social History Narrative   Not on file   Social Drivers of Health   Financial Resource Strain: Low Risk  (05/07/2022)   Overall Financial Resource Strain (CARDIA)    Difficulty of Paying Living Expenses: Not very hard  Food Insecurity: No Food Insecurity (05/07/2022)   Hunger Vital Sign    Worried About Running Out of Food in the Last Year: Never true    Ran Out of Food in the Last Year: Never true  Transportation Needs: No Transportation Needs (05/07/2022)   PRAPARE - Administrator, Civil Service (Medical): No    Lack of Transportation (Non-Medical): No  Physical Activity: Not on file  Stress: Not on file  Social Connections: Not on file  Intimate Partner Violence: Unknown (05/07/2022)   Humiliation, Afraid, Rape, and Kick questionnaire    Fear of Current or Ex-Partner: No    Emotionally Abused: No    Physically Abused: No    Sexually  Abused: Not on file    FAMILY HISTORY: Family History  Problem Relation Age of Onset   Rheum arthritis Mother    Other Sister        Pre-cancerous uterine mass;    Rheum arthritis Sister    Bone cancer Paternal Grandfather    Brain cancer Maternal Uncle    Brain cancer Maternal Aunt    Osteoporosis Neg Hx     ALLERGIES:  has no known allergies.  MEDICATIONS:  Current Outpatient Medications  Medication Sig Dispense Refill   acyclovir (ZOVIRAX) 400 MG tablet Take 1 tablet (400 mg total) by mouth 2 (two) times daily. 60 tablet 11   aluminum chloride (DRYSOL) 20 % external solution Apply 1 application topically at bedtime.     calcium-vitamin D (OSCAL WITH D) 250-125 MG-UNIT tablet Take 1 tablet by  mouth daily.     cyanocobalamin (VITAMIN B12) 1000 MCG tablet Take 1 tablet (1,000 mcg total) by mouth daily. 30 tablet 3   cyclobenzaprine (FLEXERIL) 10 MG tablet Take 10 mg by mouth 3 (three) times daily as needed for muscle spasms.     diclofenac sodium (VOLTAREN) 1 % GEL Apply 2 g topically as needed.     dronabinol (MARINOL) 10 MG capsule Take 1 capsule (10 mg total) by mouth 2 (two) times daily before a meal. (Patient not taking: Reported on 09/16/2023) 60 capsule 0   ELIQUIS 5 MG TABS tablet Take 1 tablet (5 mg total) by mouth 2 (two) times daily. 60 tablet 5   ergocalciferol (VITAMIN D2) 1.25 MG (50000 UT) capsule Take 1 capsule (50,000 Units total) by mouth 2 (two) times a week. (Patient taking differently: Take 50,000 Units by mouth once a week.) 12 capsule 2   escitalopram (LEXAPRO) 10 MG tablet Take 10 mg by mouth daily. (Patient not taking: Reported on 09/16/2023)     esomeprazole (NEXIUM) 40 MG capsule Take 1 capsule (40 mg total) by mouth daily. Take 1 capsule (40 mg total) daily before breakfast 30 capsule 3   famotidine (PEPCID) 40 MG tablet Take 40 mg by mouth daily.     fentaNYL (DURAGESIC) 25 MCG/HR Place 1 patch onto the skin every 3 (three) days. 10 patch 0   furosemide  (LASIX) 20 MG tablet Take 1 tablet (20 mg total) by mouth daily. (Patient not taking: Reported on 09/16/2023) 30 tablet 1   lidocaine (LIDODERM) 5 % Place 1 patch onto the skin daily. Remove & Discard patch within 12 hours or as directed by MD (Patient not taking: Reported on 09/16/2023) 30 patch 0   loratadine (CLARITIN) 10 MG tablet Take 10 mg by mouth daily.     LORazepam (ATIVAN) 0.5 MG tablet Take 1 tablet (0.5 mg total) by mouth every 6 (six) hours as needed (Nausea or vomiting). 30 tablet 0   methocarbamol (ROBAXIN) 500 MG tablet Take 1 tablet (500 mg total) by mouth every 8 (eight) hours as needed for muscle spasms. 30 tablet 0   methylPREDNISolone (MEDROL DOSEPAK) 4 MG TBPK tablet Take 6 pills by mouth day 1, 5 on day 2, 4 on day 3, 3 on day 4, 2 on day 5, 1 on day 6 21 tablet 0   metroNIDAZOLE (METROGEL) 1 % gel Apply topically daily. 45 g 0   Multiple Vitamin (MULTIVITAMIN) tablet Take 1 tablet by mouth daily.     Omega-3 Fatty Acids (FISH OIL) 1360 MG CAPS Take 1 capsule by mouth daily.     ondansetron (ZOFRAN) 8 MG tablet Take 1 tablet (8 mg total) by mouth every 8 (eight) hours as needed for nausea or vomiting. 30 tablet 0   oxyCODONE (ROXICODONE) 5 MG immediate release tablet Take 1 tablet (5 mg total) by mouth every 4 (four) hours as needed for severe pain (pain score 7-10) or moderate pain (pain score 4-6). Take 1 to 2 tablets every 4 hours as needed for moderate to severe pain. 60 tablet 0   prochlorperazine (COMPAZINE) 10 MG tablet TAKE ONE TABLET BY MOUTH EVERY 6 HOURS AS NEEDED FOR NAUSEA AND VOMITING 30 tablet 1   REVLIMID 15 MG capsule TAKE 1 CAPSULE DAILY FOR 21 DAYS ON, THEN 7 DAYS OFF 21 capsule 0   senna-docusate (SENNA S) 8.6-50 MG tablet Take 2 tablets by mouth at bedtime. 60 tablet 1   terbinafine (LAMISIL) 1 % cream  Apply 1 application topically 2 (two) times daily.     No current facility-administered medications for this visit.   REVIEW OF SYSTEMS:  10 Point review  of Systems was done is negative except as noted above.   PHYSICAL EXAMINATION: .BP 132/76 (BP Location: Left Arm, Patient Position: Sitting)   Pulse 62   Temp 97.7 F (36.5 C) (Temporal)   Resp 18   Wt 185 lb 11.2 oz (84.2 kg)   SpO2 100%   BMI 29.08 kg/m   GENERAL:alert, in no acute distress and comfortable SKIN: no acute rashes, no significant lesions EYES: conjunctiva are pink and non-injected, sclera anicteric OROPHARYNX: MMM, no exudates, no oropharyngeal erythema or ulceration NECK: supple, no JVD LYMPH:  no palpable lymphadenopathy in the cervical, axillary or inguinal regions LUNGS: clear to auscultation b/l with normal respiratory effort HEART: regular rate & rhythm ABDOMEN:  normoactive bowel sounds , non tender, not distended. Extremity: no pedal edema PSYCH: alert & oriented x 3 with fluent speech NEURO: no focal motor/sensory deficits   LABORATORY DATA:  I have reviewed the data as listed .    Latest Ref Rng & Units 09/16/2023    8:13 AM 09/01/2023    1:12 PM 08/15/2023    8:49 AM  CBC  WBC 4.0 - 10.5 K/uL 3.5  5.7  4.2   Hemoglobin 13.0 - 17.0 g/dL 95.6  21.3  08.6   Hematocrit 39.0 - 52.0 % 44.1  45.9  44.7   Platelets 150 - 400 K/uL 153  207  177       Latest Ref Rng & Units 09/16/2023    8:13 AM 09/01/2023    1:12 PM 08/15/2023    8:49 AM  CMP  Glucose 70 - 99 mg/dL 94  98  95   BUN 8 - 23 mg/dL 16  15  16    Creatinine 0.61 - 1.24 mg/dL 5.78  4.69  6.29   Sodium 135 - 145 mmol/L 137  134  137   Potassium 3.5 - 5.1 mmol/L 4.0  4.2  3.8   Chloride 98 - 111 mmol/L 101  102  105   CO2 22 - 32 mmol/L 29  26  25    Calcium 8.9 - 10.3 mg/dL 9.3  9.5  9.3   Total Protein 6.5 - 8.1 g/dL 6.3  6.5  6.2   Total Bilirubin 0.0 - 1.2 mg/dL 0.7  0.8  0.8   Alkaline Phos 38 - 126 U/L 45  41  42   AST 15 - 41 U/L 12  12  10    ALT 0 - 44 U/L 12  11  12       PATHOLOGY Surgical Pathology CASE: [id number] PATIENT: Anthony [name] Bone Marrow  Report     Clinical History: Multiple Myeloma     DIAGNOSIS:  BONE MARROW, ASPIRATE, CLOT, CORE: -Hypercellular bone marrow (60%) with erythroid predominant trilineage hematopoiesis and no evidence of residual plasma cell neoplasm (less than 1% plasma cells by manual aspirate differential, and CD138 immunohistochemical analysis of clot and core sections).  PERIPHERAL BLOOD: -Leukopenia  MICROSCOPIC DESCRIPTION:  PERIPHERAL BLOOD SMEAR: Platelets: Adequate in number, no platelet clumps identified Erythroid: Normocytic normochromic red blood cells Leukocytes: Mild leukopenia, negative for dysplastic granulocytes, blasts or  plasma cells  BONE MARROW ASPIRATE: Cellular Erythroid precursors: Relatively increased, show a full sequence of maturation with occasional abnormal forms (including binucleate forms, megaloblastoid change and nuclear membrane irregularities) Granulocytic precursors: Decreased, show a full sequence of generally orderly  maturation Megakaryocytes: Normal in number and morphology Lymphocytes/plasma cells: Not increased   02/06/2022 Molecular pathology   RADIOGRAPHIC STUDIES: I have personally reviewed the radiological images as listed and agreed with the findings in the report. No results found.   ASSESSMENT & PLAN:   64 y.o. very pleasant male with  1. R-ISS stage III multiple myeloma with extensive bone metastases and patholgiic fracture rt shoulder -Recent bone marrow biopsy and aspiration done 02/06/2022 revealed hypercellular bone marrow with plasma cell neoplasm. Cytogenetics-normal karyotype Molecular cytogenetics-TP53 deletion and duplication of 1 q.Marland Kitchen  PLAN: -Discussed lab results from today, 09/30/2023, in detail with the patient. CBC is stable. CMP pending.  -Discussed Multiple myeloma panel results from 09/16/2023. Did not show M-Protein.  -We will reduce the fentanyl patch dosage to 12.5 mg. -Recommend to stay physically active to  help with fatigue and mental health.  -Recommend to eat well and stay well-hydrated.  -no clinical sign or evidence of multiple myeloma progression at this time -continue maintenance Revlimid treatment and maintenance Carfilzomib -continue 1,000 mcg daily vitamin B12  -continue B complex supplements -continue vitamin D once a week -answered all of patient's and his wife's questions in detail. -Discussed with the patient to let us know if he is going to transfer care facility to [facility]. He will most likely transition care early June.   FOLLOW-UP: Per integrated scheduling  The total time spent in the appointment was 30 minutes* .  All of the patient's questions were answered with apparent satisfaction. The patient knows to call the clinic with any problems, questions or concerns.   Wyvonnia Lora MD MS AAHIVMS [facility] Hematology/Oncology Physician [facility]  .*Total Encounter Time as defined by the Centers for Medicare and Medicaid Services includes, in addition to the face-to-face time of a patient visit (documented in the note above) non-face-to-face time: obtaining and reviewing outside history, ordering and reviewing medications, tests or procedures, care coordination (communications with other health care professionals or caregivers) and documentation in the medical record.   I,Param Shah,acting as a Neurosurgeon for Wyvonnia Lora, MD.,have documented all relevant documentation on the behalf of Wyvonnia Lora, MD,as directed by  Wyvonnia Lora, MD while in the presence of Wyvonnia Lora, MD.  .I have reviewed the above documentation for accuracy and completeness, and I agree with the above. Johney Maine MD

## 2023-10-06 ENCOUNTER — Inpatient Hospital Stay: Payer: BC Managed Care – PPO

## 2023-10-06 NOTE — Progress Notes (Signed)
[facility] CSW Counseling Note  Patient was referred by  Dietician . Treatment type: Individual  Presenting Concerns: Patient and/or family reports the following symptoms/concerns: anxiety and stress; Severity of problem: mild   Orientation:oriented to person, place, and time/date.   Affect: Congruent Risk of harm to self or others: No plan to harm self or others  Patient and/or Family's Strengths/Protective Factors: Social connections, Social and Emotional competence, Concrete supports in place (healthy food, safe environments, etc.), Sense of purpose, and Physical Health (exercise, healthy diet, medication compliance, etc.)Ability for insight  Active sense of humor  Average or above average intelligence  Capable of independent living  Arboriculturist fund of knowledge  Motivation for treatment/growth  Religious Affiliation  Supportive family/friends      Goals Addressed: Patient will:  Reduce symptoms of: stress Increase knowledge and/or ability of: coping skills  Increase healthy adjustment to current life circumstances   Progress towards Goals: Progressing   Interventions: Interventions utilized:  Solution Focused , DBT, Strength-Based, Psycho education   Assessment: Patient reported improved stress, depression, and anxiety levels since last session. Patient attributed stress relief to solution based actions for reported stressor and improved tolerance of changes in medical care. Patient has been applying skills to manage emotion distress with the unknown, completing tasks around the home, and exercising more. Patient is continuing to manage the indefinite feeling of treatment.       Plan: Follow up with CSW: 2 Weeks Behavioral recommendations: As you transition from the perspective of "living to die" to "living to live", allow yourself the opportunity to dream and explore what this may look like? What are your needs / values at      this time.   3. CSW referred patient's spouse to Spiritual Care as a source of support.     Marguerita Merles, LCSW

## 2023-10-07 ENCOUNTER — Other Ambulatory Visit: Payer: Self-pay

## 2023-10-07 ENCOUNTER — Encounter: Payer: Self-pay | Admitting: Hematology

## 2023-10-07 DIAGNOSIS — C9000 Multiple myeloma not having achieved remission: Secondary | ICD-10-CM

## 2023-10-07 MED ORDER — REVLIMID 15 MG PO CAPS: ORAL_CAPSULE | ORAL | 0 refills | Status: DC

## 2023-10-09 DIAGNOSIS — G4733 Obstructive sleep apnea (adult) (pediatric): Secondary | ICD-10-CM | POA: Diagnosis not present

## 2023-10-13 ENCOUNTER — Other Ambulatory Visit: Payer: Self-pay

## 2023-10-13 DIAGNOSIS — C9000 Multiple myeloma not having achieved remission: Secondary | ICD-10-CM

## 2023-10-13 MED ORDER — APIXABAN 5 MG PO TABS: 5.0000 mg | ORAL_TABLET | Freq: Two times a day (BID) | ORAL | 5 refills | Status: DC

## 2023-10-14 ENCOUNTER — Other Ambulatory Visit: Payer: Self-pay

## 2023-10-16 ENCOUNTER — Inpatient Hospital Stay: Payer: BC Managed Care – PPO

## 2023-10-16 ENCOUNTER — Ambulatory Visit: Payer: BC Managed Care – PPO

## 2023-10-16 ENCOUNTER — Ambulatory Visit: Payer: BC Managed Care – PPO | Admitting: Dietician

## 2023-10-16 ENCOUNTER — Other Ambulatory Visit: Payer: BC Managed Care – PPO

## 2023-10-16 ENCOUNTER — Other Ambulatory Visit: Payer: Self-pay

## 2023-10-16 VITALS — BP 137/85 | HR 60 | Temp 98.4°F | Resp 17 | Wt 189.0 lb

## 2023-10-16 DIAGNOSIS — R197 Diarrhea, unspecified: Secondary | ICD-10-CM | POA: Diagnosis not present

## 2023-10-16 DIAGNOSIS — M545 Low back pain, unspecified: Secondary | ICD-10-CM | POA: Diagnosis not present

## 2023-10-16 DIAGNOSIS — Z79891 Long term (current) use of opiate analgesic: Secondary | ICD-10-CM | POA: Diagnosis not present

## 2023-10-16 DIAGNOSIS — C9000 Multiple myeloma not having achieved remission: Secondary | ICD-10-CM | POA: Diagnosis not present

## 2023-10-16 DIAGNOSIS — Z809 Family history of malignant neoplasm, unspecified: Secondary | ICD-10-CM | POA: Diagnosis not present

## 2023-10-16 DIAGNOSIS — M25511 Pain in right shoulder: Secondary | ICD-10-CM | POA: Diagnosis not present

## 2023-10-16 DIAGNOSIS — Z7189 Other specified counseling: Secondary | ICD-10-CM

## 2023-10-16 DIAGNOSIS — R059 Cough, unspecified: Secondary | ICD-10-CM | POA: Diagnosis not present

## 2023-10-16 DIAGNOSIS — R110 Nausea: Secondary | ICD-10-CM | POA: Diagnosis not present

## 2023-10-16 DIAGNOSIS — R0602 Shortness of breath: Secondary | ICD-10-CM | POA: Diagnosis not present

## 2023-10-16 DIAGNOSIS — Z5112 Encounter for antineoplastic immunotherapy: Secondary | ICD-10-CM | POA: Diagnosis not present

## 2023-10-16 DIAGNOSIS — Z87891 Personal history of nicotine dependence: Secondary | ICD-10-CM | POA: Diagnosis not present

## 2023-10-16 DIAGNOSIS — Z808 Family history of malignant neoplasm of other organs or systems: Secondary | ICD-10-CM | POA: Diagnosis not present

## 2023-10-16 DIAGNOSIS — R5383 Other fatigue: Secondary | ICD-10-CM | POA: Diagnosis not present

## 2023-10-16 DIAGNOSIS — G4730 Sleep apnea, unspecified: Secondary | ICD-10-CM | POA: Diagnosis not present

## 2023-10-16 LAB — CMP (CANCER CENTER ONLY)
ALT: 8 U/L (ref 0–44)
AST: 11 U/L — ABNORMAL LOW (ref 15–41)
Albumin: 4.2 g/dL (ref 3.5–5.0)
Alkaline Phosphatase: 44 U/L (ref 38–126)
Anion gap: 6 (ref 5–15)
BUN: 11 mg/dL (ref 8–23)
CO2: 26 mmol/L (ref 22–32)
Calcium: 9 mg/dL (ref 8.9–10.3)
Chloride: 108 mmol/L (ref 98–111)
Creatinine: 0.87 mg/dL (ref 0.61–1.24)
GFR, Estimated: >60 mL/min (ref >60)
Glucose, Bld: 81 mg/dL (ref 70–99)
Potassium: 3.8 mmol/L (ref 3.5–5.1)
Sodium: 140 mmol/L (ref 135–145)
Total Bilirubin: 0.8 mg/dL (ref 0.0–1.2)
Total Protein: 6.3 g/dL — ABNORMAL LOW (ref 6.5–8.1)

## 2023-10-16 LAB — CBC WITH DIFFERENTIAL (CANCER CENTER ONLY)
Abs Immature Granulocytes: 0.01 10*3/uL (ref 0.00–0.07)
Basophils Absolute: 0 10*3/uL (ref 0.0–0.1)
Basophils Relative: 1 %
Eosinophils Absolute: 0.1 10*3/uL (ref 0.0–0.5)
Eosinophils Relative: 2 %
HCT: 43.3 % (ref 39.0–52.0)
Hemoglobin: 14.4 g/dL (ref 13.0–17.0)
Immature Granulocytes: 0 %
Lymphocytes Relative: 32 %
Lymphs Abs: 0.9 10*3/uL (ref 0.7–4.0)
MCH: 28.2 pg (ref 26.0–34.0)
MCHC: 33.3 g/dL (ref 30.0–36.0)
MCV: 84.9 fL (ref 80.0–100.0)
Monocytes Absolute: 0.4 10*3/uL (ref 0.1–1.0)
Monocytes Relative: 15 %
Neutro Abs: 1.3 10*3/uL — ABNORMAL LOW (ref 1.7–7.7)
Neutrophils Relative %: 50 %
Platelet Count: 164 10*3/uL (ref 150–400)
RBC: 5.1 MIL/uL (ref 4.22–5.81)
RDW: 13.7 % (ref 11.5–15.5)
WBC Count: 2.7 10*3/uL — ABNORMAL LOW (ref 4.0–10.5)
nRBC: 0 % (ref 0.0–0.2)

## 2023-10-16 MED ORDER — DEXAMETHASONE SODIUM PHOSPHATE 10 MG/ML IJ SOLN
8.0000 mg | Freq: Once | INTRAMUSCULAR | Status: AC
  Administered 2023-10-16: 8 mg via INTRAVENOUS
  Filled 2023-10-16: qty 1

## 2023-10-16 MED ORDER — ESOMEPRAZOLE MAGNESIUM 40 MG PO CPDR: 40.0000 mg | DELAYED_RELEASE_CAPSULE | Freq: Every day | ORAL | 3 refills | Status: AC

## 2023-10-16 MED ORDER — PALONOSETRON HCL INJECTION 0.25 MG/5ML
0.2500 mg | Freq: Once | INTRAVENOUS | Status: AC
  Administered 2023-10-16: 0.25 mg via INTRAVENOUS
  Filled 2023-10-16: qty 5

## 2023-10-16 MED ORDER — ACETAMINOPHEN 325 MG PO TABS
650.0000 mg | ORAL_TABLET | Freq: Once | ORAL | Status: AC
  Administered 2023-10-16: 650 mg via ORAL
  Filled 2023-10-16: qty 2

## 2023-10-16 MED ORDER — DEXTROSE 5 % IV SOLN
70.0000 mg/m2 | Freq: Once | INTRAVENOUS | Status: AC
  Administered 2023-10-16: 140 mg via INTRAVENOUS
  Filled 2023-10-16: qty 60

## 2023-10-16 MED ORDER — SODIUM CHLORIDE 0.9 % IV SOLN: Freq: Once | INTRAVENOUS | Status: AC

## 2023-10-16 NOTE — Progress Notes (Signed)
 Nutrition Follow-up:  Patient with multiple myeloma. He is receiving kyprolis + darzalex q28d (start 04/07/22)   Met with patient in infusion. He is doing well overall. Appetite has been good. Yesterday had scrambled egg/mayo sandwich on whole wheat, chicken sandwich for lunch, spaghetti for dinner. Patient had a few bites of rice, banana, and cinnamon applesauce last night in anticipation of nausea following treatment. Patient has not been drinking protein shakes recently. Patient gets in 4 bottles of water most days. He denies diarrhea, constipation.    Medications: reviewed  Labs: reviewed   Anthropometrics: Wt 189 lb today   2/11 - 185 lb 11.2 oz 1/28 - 186 lb 12.8 oz  12/27 - 185 lb 4 oz   NUTRITION DIAGNOSIS: Unintended wt loss - improved   INTERVENTION:  Continue including good sources of protein at every meal    MONITORING, EVALUATION, GOAL: wt trends, intake   NEXT VISIT: Friday April 11 during infusion

## 2023-10-16 NOTE — Patient Instructions (Signed)

## 2023-10-20 LAB — MULTIPLE MYELOMA PANEL, SERUM
Albumin SerPl Elph-Mcnc: 3.8 g/dL (ref 2.9–4.4)
Albumin/Glob SerPl: 2.1 — ABNORMAL HIGH (ref 0.7–1.7)
Alpha 1: 0.2 g/dL (ref 0.0–0.4)
Alpha2 Glob SerPl Elph-Mcnc: 0.5 g/dL (ref 0.4–1.0)
B-Globulin SerPl Elph-Mcnc: 0.8 g/dL (ref 0.7–1.3)
Gamma Glob SerPl Elph-Mcnc: 0.4 g/dL (ref 0.4–1.8)
Globulin, Total: 1.9 g/dL — ABNORMAL LOW (ref 2.2–3.9)
IgA: 27 mg/dL — ABNORMAL LOW (ref 61–437)
IgG (Immunoglobin G), Serum: 564 mg/dL — ABNORMAL LOW (ref 603–1613)
IgM (Immunoglobulin M), Srm: 21 mg/dL (ref 20–172)
Total Protein ELP: 5.7 g/dL — ABNORMAL LOW (ref 6.0–8.5)

## 2023-10-26 ENCOUNTER — Other Ambulatory Visit: Payer: Self-pay

## 2023-10-27 ENCOUNTER — Inpatient Hospital Stay: Attending: Physician Assistant

## 2023-10-27 DIAGNOSIS — G8929 Other chronic pain: Secondary | ICD-10-CM | POA: Insufficient documentation

## 2023-10-27 DIAGNOSIS — M545 Low back pain, unspecified: Secondary | ICD-10-CM | POA: Insufficient documentation

## 2023-10-27 DIAGNOSIS — G4730 Sleep apnea, unspecified: Secondary | ICD-10-CM | POA: Insufficient documentation

## 2023-10-27 DIAGNOSIS — R059 Cough, unspecified: Secondary | ICD-10-CM | POA: Insufficient documentation

## 2023-10-27 DIAGNOSIS — Z79891 Long term (current) use of opiate analgesic: Secondary | ICD-10-CM | POA: Insufficient documentation

## 2023-10-27 DIAGNOSIS — Z5112 Encounter for antineoplastic immunotherapy: Secondary | ICD-10-CM | POA: Insufficient documentation

## 2023-10-27 DIAGNOSIS — Z809 Family history of malignant neoplasm, unspecified: Secondary | ICD-10-CM | POA: Insufficient documentation

## 2023-10-27 DIAGNOSIS — R5383 Other fatigue: Secondary | ICD-10-CM | POA: Insufficient documentation

## 2023-10-27 DIAGNOSIS — Z808 Family history of malignant neoplasm of other organs or systems: Secondary | ICD-10-CM | POA: Insufficient documentation

## 2023-10-27 DIAGNOSIS — C9000 Multiple myeloma not having achieved remission: Secondary | ICD-10-CM | POA: Insufficient documentation

## 2023-10-27 DIAGNOSIS — R0602 Shortness of breath: Secondary | ICD-10-CM | POA: Insufficient documentation

## 2023-10-27 DIAGNOSIS — Z87891 Personal history of nicotine dependence: Secondary | ICD-10-CM | POA: Insufficient documentation

## 2023-10-27 DIAGNOSIS — M25511 Pain in right shoulder: Secondary | ICD-10-CM | POA: Insufficient documentation

## 2023-10-27 NOTE — Progress Notes (Signed)
 [facility] CSW Counseling Note  Patient was referred by  Dietician . Treatment type: Individual  Presenting Concerns: Patient and/or family reports the following symptoms/concerns: anxiety and stress; Severity of problem: mild   Orientation:oriented to person, place, and time/date.   Affect: Congruent Risk of harm to self or others: No plan to harm self or others  Patient and/or Family's Strengths/Protective Factors: Social connections, Social and Emotional competence, Concrete supports in place (healthy food, safe environments, etc.), Sense of purpose, and Physical Health (exercise, healthy diet, medication compliance, etc.)Ability for insight  Active sense of humor  Average or above average intelligence  Capable of independent living  Arboriculturist fund of knowledge  Motivation for treatment/growth  Religious Affiliation  Supportive family/friends      Goals Addressed: Patient will:  Reduce symptoms of: stress Increase knowledge and/or ability of: coping skills  Increase healthy adjustment to current life circumstances   Progress towards Goals: Progressing   Interventions: Interventions utilized: CBT Assessment: Patient reported improved stress, depression, and anxiety levels since last session. Patient attributed stress relief to solution based actions for reported stressor and improved tolerance of changes in medical care. Patient has reported improvement in mood is attributed to resolution of financial concerns. Patient described skills that he has been applying when depressive / anxious feelings arise.     Plan: Follow up: 2 weeks 3/24  Referrals: CSW and patient discussed transition plan as patient prepares to move care in June. CSW will see patient in two weeks and patient will transition to appointments every three weeks. Patient is clinically appropriate for transition plan, symptoms have been managed and no SI/HI     Marguerita Merles,  LCSW

## 2023-10-29 ENCOUNTER — Other Ambulatory Visit: Payer: Self-pay

## 2023-10-29 ENCOUNTER — Inpatient Hospital Stay (HOSPITAL_BASED_OUTPATIENT_CLINIC_OR_DEPARTMENT_OTHER): Payer: BC Managed Care – PPO | Admitting: Hematology

## 2023-10-29 ENCOUNTER — Inpatient Hospital Stay: Payer: BC Managed Care – PPO

## 2023-10-29 ENCOUNTER — Encounter: Payer: Self-pay | Admitting: Hematology

## 2023-10-29 VITALS — BP 149/76 | HR 58 | Temp 97.7°F | Resp 16 | Wt 187.5 lb

## 2023-10-29 DIAGNOSIS — Z809 Family history of malignant neoplasm, unspecified: Secondary | ICD-10-CM | POA: Diagnosis not present

## 2023-10-29 DIAGNOSIS — Z7189 Other specified counseling: Secondary | ICD-10-CM

## 2023-10-29 DIAGNOSIS — R059 Cough, unspecified: Secondary | ICD-10-CM | POA: Diagnosis not present

## 2023-10-29 DIAGNOSIS — G8929 Other chronic pain: Secondary | ICD-10-CM | POA: Diagnosis not present

## 2023-10-29 DIAGNOSIS — C9000 Multiple myeloma not having achieved remission: Secondary | ICD-10-CM | POA: Diagnosis not present

## 2023-10-29 DIAGNOSIS — Z5111 Encounter for antineoplastic chemotherapy: Secondary | ICD-10-CM | POA: Diagnosis not present

## 2023-10-29 DIAGNOSIS — R5383 Other fatigue: Secondary | ICD-10-CM | POA: Diagnosis not present

## 2023-10-29 DIAGNOSIS — M25511 Pain in right shoulder: Secondary | ICD-10-CM | POA: Diagnosis not present

## 2023-10-29 DIAGNOSIS — M545 Low back pain, unspecified: Secondary | ICD-10-CM | POA: Diagnosis not present

## 2023-10-29 DIAGNOSIS — Z808 Family history of malignant neoplasm of other organs or systems: Secondary | ICD-10-CM | POA: Diagnosis not present

## 2023-10-29 DIAGNOSIS — Z79891 Long term (current) use of opiate analgesic: Secondary | ICD-10-CM | POA: Diagnosis not present

## 2023-10-29 DIAGNOSIS — Z87891 Personal history of nicotine dependence: Secondary | ICD-10-CM | POA: Diagnosis not present

## 2023-10-29 DIAGNOSIS — Z5112 Encounter for antineoplastic immunotherapy: Secondary | ICD-10-CM | POA: Diagnosis not present

## 2023-10-29 DIAGNOSIS — G4730 Sleep apnea, unspecified: Secondary | ICD-10-CM | POA: Diagnosis not present

## 2023-10-29 DIAGNOSIS — R0602 Shortness of breath: Secondary | ICD-10-CM | POA: Diagnosis not present

## 2023-10-29 LAB — CBC WITH DIFFERENTIAL (CANCER CENTER ONLY)
Abs Immature Granulocytes: 0.02 10*3/uL (ref 0.00–0.07)
Basophils Absolute: 0 10*3/uL (ref 0.0–0.1)
Basophils Relative: 1 %
Eosinophils Absolute: 0.1 10*3/uL (ref 0.0–0.5)
Eosinophils Relative: 3 %
HCT: 43.6 % (ref 39.0–52.0)
Hemoglobin: 14.5 g/dL (ref 13.0–17.0)
Immature Granulocytes: 1 %
Lymphocytes Relative: 20 %
Lymphs Abs: 0.8 10*3/uL (ref 0.7–4.0)
MCH: 28.2 pg (ref 26.0–34.0)
MCHC: 33.3 g/dL (ref 30.0–36.0)
MCV: 84.7 fL (ref 80.0–100.0)
Monocytes Absolute: 0.6 10*3/uL (ref 0.1–1.0)
Monocytes Relative: 14 %
Neutro Abs: 2.5 10*3/uL (ref 1.7–7.7)
Neutrophils Relative %: 61 %
Platelet Count: 183 10*3/uL (ref 150–400)
RBC: 5.15 MIL/uL (ref 4.22–5.81)
RDW: 13.9 % (ref 11.5–15.5)
WBC Count: 4.1 10*3/uL (ref 4.0–10.5)
nRBC: 0 % (ref 0.0–0.2)

## 2023-10-29 LAB — CMP (CANCER CENTER ONLY)
ALT: 10 U/L (ref 0–44)
AST: 12 U/L — ABNORMAL LOW (ref 15–41)
Albumin: 4.4 g/dL (ref 3.5–5.0)
Alkaline Phosphatase: 44 U/L (ref 38–126)
Anion gap: 6 (ref 5–15)
BUN: 11 mg/dL (ref 8–23)
CO2: 24 mmol/L (ref 22–32)
Calcium: 8.7 mg/dL — ABNORMAL LOW (ref 8.9–10.3)
Chloride: 107 mmol/L (ref 98–111)
Creatinine: 0.91 mg/dL (ref 0.61–1.24)
GFR, Estimated: >60 mL/min (ref >60)
Glucose, Bld: 96 mg/dL (ref 70–99)
Potassium: 3.8 mmol/L (ref 3.5–5.1)
Sodium: 137 mmol/L (ref 135–145)
Total Bilirubin: 0.8 mg/dL (ref 0.0–1.2)
Total Protein: 6.3 g/dL — ABNORMAL LOW (ref 6.5–8.1)

## 2023-10-29 MED ORDER — FENTANYL 12 MCG/HR TD PT72: 1.0000 | MEDICATED_PATCH | TRANSDERMAL | 0 refills | Status: DC

## 2023-10-29 MED ORDER — ZOLEDRONIC ACID 4 MG/100ML IV SOLN
4.0000 mg | Freq: Once | INTRAVENOUS | Status: AC
  Administered 2023-10-29: 4 mg via INTRAVENOUS
  Filled 2023-10-29: qty 100

## 2023-10-29 MED ORDER — DEXTROSE 5 % IV SOLN
70.0000 mg/m2 | Freq: Once | INTRAVENOUS | Status: AC
  Administered 2023-10-29: 140 mg via INTRAVENOUS
  Filled 2023-10-29: qty 60

## 2023-10-29 MED ORDER — DEXAMETHASONE SODIUM PHOSPHATE 10 MG/ML IJ SOLN
8.0000 mg | Freq: Once | INTRAMUSCULAR | Status: AC
  Administered 2023-10-29: 8 mg via INTRAVENOUS
  Filled 2023-10-29: qty 1

## 2023-10-29 MED ORDER — PALONOSETRON HCL INJECTION 0.25 MG/5ML
0.2500 mg | Freq: Once | INTRAVENOUS | Status: AC
  Administered 2023-10-29: 0.25 mg via INTRAVENOUS
  Filled 2023-10-29: qty 5

## 2023-10-29 MED ORDER — SODIUM CHLORIDE 0.9 % IV SOLN: Freq: Once | INTRAVENOUS | Status: AC

## 2023-10-29 NOTE — Patient Instructions (Addendum)
 [facility] - A DEPT OF [facility]  Discharge Instructions: Thank you for choosing [facility] to provide your oncology and hematology care.   If you have a lab appointment with the Cancer Center, please go directly to the Cancer Center and check in at the registration area.   Wear comfortable clothing and clothing appropriate for easy access to any Portacath or PICC line.   We strive to give you quality time with your provider. You may need to reschedule your appointment if you arrive late (15 or more minutes).  Arriving late affects you and other patients whose appointments are after yours.  Also, if you miss three or more appointments without notifying the office, you may be dismissed from the clinic at the provider's discretion.      For prescription refill requests, have your pharmacy contact our office and allow 72 hours for refills to be completed.    Today you received the following chemotherapy and/or immunotherapy agents :  Carfilzomib      To help prevent nausea and vomiting after your treatment, we encourage you to take your nausea medication as directed.  BELOW ARE SYMPTOMS THAT SHOULD BE REPORTED IMMEDIATELY: *FEVER GREATER THAN 100.4 F (38 C) OR HIGHER *CHILLS OR SWEATING *NAUSEA AND VOMITING THAT IS NOT CONTROLLED WITH YOUR NAUSEA MEDICATION *UNUSUAL SHORTNESS OF BREATH *UNUSUAL BRUISING OR BLEEDING *URINARY PROBLEMS (pain or burning when urinating, or frequent urination) *BOWEL PROBLEMS (unusual diarrhea, constipation, pain near the anus) TENDERNESS IN MOUTH AND THROAT WITH OR WITHOUT PRESENCE OF ULCERS (sore throat, sores in mouth, or a toothache) UNUSUAL RASH, SWELLING OR PAIN  UNUSUAL VAGINAL DISCHARGE OR ITCHING   Items with * indicate a potential emergency and should be followed up as soon as possible or go to the Emergency Department if any problems should occur.  Please show the CHEMOTHERAPY ALERT CARD or  IMMUNOTHERAPY ALERT CARD at check-in to the Emergency Department and triage nurse.  Should you have questions after your visit or need to cancel or reschedule your appointment, please contact [facility] - A DEPT OF [facility]  Dept: [phone]  and follow the prompts.  Office hours are 8:00 a.m. to 4:30 p.m. Monday - Friday. Please note that voicemails left after 4:00 p.m. may not be returned until the following business day.  We are closed weekends and major holidays. You have access to a nurse at all times for urgent questions. Please call the main number to the clinic Dept: [phone] and follow the prompts.   For any non-urgent questions, you may also contact your provider using MyChart. We now offer e-Visits for anyone 66 and older to request care online for non-urgent symptoms. For details visit [email or web address].   Also download the MyChart app! Go to the app store, search "MyChart", open the app, select [facility], and log in with your MyChart username and password.  Zoledronic Acid Injection (Cancer) What is this medication? ZOLEDRONIC ACID (ZOE le dron ik AS id) treats high calcium levels in the blood caused by cancer. It may also be used with chemotherapy to treat weakened bones caused by cancer. It works by slowing down the release of calcium from bones. This lowers calcium levels in your blood. It also makes your bones stronger and less likely to break (fracture). It belongs to a group of medications called bisphosphonates. This medicine may be used for other purposes; ask your health  care provider or pharmacist if you have questions. COMMON BRAND NAME(S): Zometa, Zometa Powder What should I tell my care team before I take this medication? They need to know if you have any of these conditions: Dehydration Dental disease Kidney disease Liver disease Low levels of calcium in the blood Lung or breathing disease, such as asthma Receiving  steroids, such as dexamethasone or prednisone An unusual or allergic reaction to zoledronic acid, other medications, foods, dyes, or preservatives Pregnant or trying to get pregnant Breast-feeding How should I use this medication? This medication is injected into a vein. It is given by your care team in a hospital or clinic setting. Talk to your care team about the use of this medication in children. Special care may be needed. Overdosage: If you think you have taken too much of this medicine contact a poison control center or emergency room at once. NOTE: This medicine is only for you. Do not share this medicine with others. What if I miss a dose? Keep appointments for follow-up doses. It is important not to miss your dose. Call your care team if you are unable to keep an appointment. What may interact with this medication? Certain antibiotics given by injection Diuretics, such as bumetanide, furosemide NSAIDs, medications for pain and inflammation, such as ibuprofen or naproxen Teriparatide Thalidomide This list may not describe all possible interactions. Give your health care provider a list of all the medicines, herbs, non-prescription drugs, or dietary supplements you use. Also tell them if you smoke, drink alcohol, or use illegal drugs. Some items may interact with your medicine. What should I watch for while using this medication? Visit your care team for regular checks on your progress. It may be some time before you see the benefit from this medication. Some people who take this medication have severe bone, joint, or muscle pain. This medication may also increase your risk for jaw problems or a broken thigh bone. Tell your care team right away if you have severe pain in your jaw, bones, joints, or muscles. Tell you care team if you have any pain that does not go away or that gets worse. Tell your dentist and dental surgeon that you are taking this medication. You should not have major  dental surgery while on this medication. See your dentist to have a dental exam and fix any dental problems before starting this medication. Take good care of your teeth while on this medication. Make sure you see your dentist for regular follow-up appointments. You should make sure you get enough calcium and vitamin D while you are taking this medication. Discuss the foods you eat and the vitamins you take with your care team. Check with your care team if you have severe diarrhea, nausea, and vomiting, or if you sweat a lot. The loss of too much body fluid may make it dangerous for you to take this medication. You may need bloodwork while taking this medication. Talk to your care team if you wish to become pregnant or think you might be pregnant. This medication can cause serious birth defects. What side effects may I notice from receiving this medication? Side effects that you should report to your care team as soon as possible: Allergic reactions--skin rash, itching, hives, swelling of the face, lips, tongue, or throat Kidney injury--decrease in the amount of urine, swelling of the ankles, hands, or feet Low calcium level--muscle pain or cramps, confusion, tingling, or numbness in the hands or feet Osteonecrosis of the jaw--pain, swelling,  or redness in the mouth, numbness of the jaw, poor healing after dental work, unusual discharge from the mouth, visible bones in the mouth Severe bone, joint, or muscle pain Side effects that usually do not require medical attention (report to your care team if they continue or are bothersome): Constipation Fatigue Fever Loss of appetite Nausea Stomach pain This list may not describe all possible side effects. Call your doctor for medical advice about side effects. You may report side effects to FDA at 1-800-FDA-1088. Where should I keep my medication? This medication is given in a hospital or clinic. It will not be stored at home. NOTE: This sheet is a  summary. It may not cover all possible information. If you have questions about this medicine, talk to your doctor, pharmacist, or health care provider.  2024 Elsevier/Gold Standard (2021-09-28 00:00:00)

## 2023-10-29 NOTE — Progress Notes (Signed)
 HEMATOLOGY/ONCOLOGY CLINIC NOTE  Date of Service: 10/29/23   Patient Care Team: Milus Height, [place] as PCP - General (Nurse Practitioner)  CHIEF COMPLAINTS/PURPOSE OF CONSULTATION:  Follow-up for continued evaluation and management of multiple myeloma  HISTORY OF PRESENTING ILLNESS:   Anthony [name] is a wonderful 63 y.o. male who has been referred to [place] by Dr Milus Height, [place] for evaluation and management of newly diagnosed multiple myeloma. He reports He is doing well.  He reports persistent lower back pain that he has had since he was in his 20's. He notes no previous significant injuries. He further notes that he gets chiropractic adjustments and takes Tylenol to manage his symptoms. He notes less back pain when standing up on his right leg and maintaining weight on his right leg.  He reports previous history of smoking and chewing tobacco over 40 years ago.  He had a recent fall back in February of this year with only a pulled muscle. And he notes another fall when chasing his cat where he fell on his right shoulder. He reports pain in right shoulder.   He had a recent COVID-19 infection back in March.  He reports intermittent cough with SOB. He notes he takes 2 Rolaids at night. We discussed potentially trying antacids which he is agreeable to as he will begin steroids soon and was advised of the possible symptoms and side effects from taking steroids.  We discussed CRAB criteria and that he meets at least two of the criterion being  anemia and bone disease.  We discussed getting additional scans for further evaluation which he is agreeable to.  We further discussed starting Daratumumab/Velcade/Dexamethasone/Zometa and Revlimid for treatment which he is agreeable to. We also discussed starting steroids before treatment and taking an acid suppressant which he was also agreeable to.  We discussed getting Senna and taking it as needed for constipation.  Labs done today  were reviewed in detail.  We discussed his recent bone marrow biopsy and aspiration done 02/06/2022.  We discussed PET/CT scan done 01/28/2022.  We discussed CT right shoulder w/o contrast done 11/13/2021  INTERVAL HISTORY:  Anthony [name] is a 63 y.o. male, is here for continued evaluation of and management of multiple myeloma. Patient is here for toxicity check prior to receiving cycle 20 day 15 of his Carfilzomib/Revimid/Dexamethasone treatment.  Patient was last seen by me on 09/30/2023 and he complained of back pain, low-grade intermittent nausea, mild bilateral leg swelling, grade-1 diarrhea, soreness near his right face, and fatigue. He reported of being diagnosed with sleep apnea.   Patient is accompanied by his wife during this visit. Patient notes he has been doing well overall since our last visit. He complains of chronic back pain. He continues to use fentanyl patches that controls his pain.   He denies any new infection issues, fever, chills, night sweats, unexpected weight loss, chest pain, abdominal pain, or leg swelling. He does complain of fatigue.   He denies severe anxious/depression during this visit. He occasionally gets anxious. Patient goes to his counselor, which has been helping his depressive symptoms.    He has been tolerating his current Revlimid dosage well without any new or severe toxicities.   Patient has been using Cpap machine at night. However, he still complains of lethargy when he wakes up.  Patient notes he is currently unsure when he will transition care to [facility].    MEDICAL HISTORY:  Past Medical History:  Diagnosis Date  Back pain    Cancer (HCC)    Multiple myeloma (HCC) 02/15/2022    SURGICAL HISTORY: Past Surgical History:  Procedure Laterality Date   IR BONE TUMOR(S)RF ABLATION  05/07/2022   IR BONE TUMOR(S)RF ABLATION  05/07/2022   IR BONE TUMOR(S)RF ABLATION  05/07/2022   IR KYPHO EA ADDL LEVEL THORACIC OR LUMBAR  05/07/2022   IR  KYPHO LUMBAR INC FX REDUCE BONE BX UNI/BIL CANNULATION INC/IMAGING  05/07/2022   IR KYPHO THORACIC WITH BONE BIOPSY  05/07/2022   IR RADIOLOGIST EVAL & MGMT  04/23/2022   IR RADIOLOGIST EVAL & MGMT  05/17/2022   IR RADIOLOGIST EVAL & MGMT  06/06/2022    SOCIAL HISTORY: Social History   Socioeconomic History   Marital status: Married    Spouse name: Not on file   Number of children: Not on file   Years of education: Not on file   Highest education level: Not on file  Occupational History   Not on file  Tobacco Use   Smoking status: Never   Smokeless tobacco: Former    Types: Chew, Snuff  Vaping Use   Vaping status: Never Used  Substance and Sexual Activity   Alcohol use: Yes    Alcohol/week: 2.0 standard drinks of alcohol    Types: 2 Glasses of wine per week   Drug use: No   Sexual activity: Yes  Other Topics Concern   Not on file  Social History Narrative   Not on file   Social Drivers of Health   Financial Resource Strain: Low Risk  (05/07/2022)   Overall Financial Resource Strain (CARDIA)    Difficulty of Paying Living Expenses: Not very hard  Food Insecurity: No Food Insecurity (05/07/2022)   Hunger Vital Sign    Worried About Running Out of Food in the Last Year: Never true    Ran Out of Food in the Last Year: Never true  Transportation Needs: No Transportation Needs (05/07/2022)   PRAPARE - Administrator, Civil Service (Medical): No    Lack of Transportation (Non-Medical): No  Physical Activity: Not on file  Stress: Not on file  Social Connections: Not on file  Intimate Partner Violence: Unknown (05/07/2022)   Humiliation, Afraid, Rape, and Kick questionnaire    Fear of Current or Ex-Partner: No    Emotionally Abused: No    Physically Abused: No    Sexually Abused: Not on file    FAMILY HISTORY: Family History  Problem Relation Age of Onset   Rheum arthritis Mother    Other Sister        Pre-cancerous uterine mass;    Rheum arthritis Sister     Bone cancer Paternal Grandfather    Brain cancer Maternal Uncle    Brain cancer Maternal Aunt    Osteoporosis Neg Hx     ALLERGIES:  has no known allergies.  MEDICATIONS:  Current Outpatient Medications  Medication Sig Dispense Refill   acyclovir (ZOVIRAX) 400 MG tablet Take 1 tablet (400 mg total) by mouth 2 (two) times daily. 60 tablet 11   aluminum chloride (DRYSOL) 20 % external solution Apply 1 application topically at bedtime.     apixaban (ELIQUIS) 5 MG TABS tablet Take 1 tablet (5 mg total) by mouth 2 (two) times daily. 60 tablet 5   calcium-vitamin D (OSCAL WITH D) 250-125 MG-UNIT tablet Take 1 tablet by mouth daily.     cyanocobalamin (VITAMIN B12) 1000 MCG tablet Take 1 tablet (1,000 mcg total) by  mouth daily. 30 tablet 3   cyclobenzaprine (FLEXERIL) 10 MG tablet Take 10 mg by mouth 3 (three) times daily as needed for muscle spasms. (Patient not taking: Reported on 09/30/2023)     diclofenac sodium (VOLTAREN) 1 % GEL Apply 2 g topically as needed.     dronabinol (MARINOL) 10 MG capsule Take 1 capsule (10 mg total) by mouth 2 (two) times daily before a meal. (Patient not taking: Reported on 09/16/2023) 60 capsule 0   ergocalciferol (VITAMIN D2) 1.25 MG (50000 UT) capsule Take 1 capsule (50,000 Units total) by mouth 2 (two) times a week. (Patient taking differently: Take 50,000 Units by mouth once a week.) 12 capsule 2   escitalopram (LEXAPRO) 10 MG tablet Take 10 mg by mouth daily. (Patient not taking: Reported on 09/30/2023)     esomeprazole (NEXIUM) 40 MG capsule Take 1 capsule (40 mg total) by mouth daily. Take 1 capsule (40 mg total) daily before breakfast 30 capsule 3   famotidine (PEPCID) 40 MG tablet Take 40 mg by mouth daily.     fentaNYL (DURAGESIC) 25 MCG/HR Place 1 patch onto the skin every 3 (three) days. 10 patch 0   lidocaine (LIDODERM) 5 % Place 1 patch onto the skin daily. Remove & Discard patch within 12 hours or as directed by MD 30 patch 0   loratadine (CLARITIN) 10  MG tablet Take 10 mg by mouth daily.     LORazepam (ATIVAN) 0.5 MG tablet Take 1 tablet (0.5 mg total) by mouth every 6 (six) hours as needed (Nausea or vomiting). 30 tablet 0   methocarbamol (ROBAXIN) 500 MG tablet Take 1 tablet (500 mg total) by mouth every 8 (eight) hours as needed for muscle spasms. (Patient not taking: Reported on 09/30/2023) 30 tablet 0   methylPREDNISolone (MEDROL DOSEPAK) 4 MG TBPK tablet Take 6 pills by mouth day 1, 5 on day 2, 4 on day 3, 3 on day 4, 2 on day 5, 1 on day 6 21 tablet 0   metroNIDAZOLE (METROGEL) 1 % gel Apply topically daily. 45 g 0   Multiple Vitamin (MULTIVITAMIN) tablet Take 1 tablet by mouth daily.     Omega-3 Fatty Acids (FISH OIL) 1360 MG CAPS Take 1 capsule by mouth daily.     ondansetron (ZOFRAN) 8 MG tablet Take 1 tablet (8 mg total) by mouth every 8 (eight) hours as needed for nausea or vomiting. 30 tablet 0   oxyCODONE (ROXICODONE) 5 MG immediate release tablet Take 1 tablet (5 mg total) by mouth every 4 (four) hours as needed for severe pain (pain score 7-10) or moderate pain (pain score 4-6). Take 1 to 2 tablets every 4 hours as needed for moderate to severe pain. 60 tablet 0   prochlorperazine (COMPAZINE) 10 MG tablet TAKE ONE TABLET BY MOUTH EVERY 6 HOURS AS NEEDED FOR NAUSEA AND VOMITING 30 tablet 1   REVLIMID 15 MG capsule TAKE 1 CAPSULE DAILY FOR 21 DAYS ON, THEN 7 DAYS OFF 21 capsule 0   senna-docusate (SENNA S) 8.6-50 MG tablet Take 2 tablets by mouth at bedtime. 60 tablet 1   terbinafine (LAMISIL) 1 % cream Apply 1 application topically 2 (two) times daily.     No current facility-administered medications for this visit.   REVIEW OF SYSTEMS:  10 Point review of Systems was done is negative except as noted above.   PHYSICAL EXAMINATION: .BP (!) 149/76 (BP Location: Left Arm, Patient Position: Sitting)   Pulse (!) 58   Temp  97.7 F (36.5 C) (Temporal)   Resp 16   Wt 187 lb 8 oz (85 kg)   SpO2 98%   BMI 29.37 kg/m    GENERAL:alert, in no acute distress and comfortable SKIN: no acute rashes, no significant lesions EYES: conjunctiva are pink and non-injected, sclera anicteric OROPHARYNX: MMM, no exudates, no oropharyngeal erythema or ulceration NECK: supple, no JVD LYMPH:  no palpable lymphadenopathy in the cervical, axillary or inguinal regions LUNGS: clear to auscultation b/l with normal respiratory effort HEART: regular rate & rhythm ABDOMEN:  normoactive bowel sounds , non tender, not distended. Extremity: no pedal edema PSYCH: alert & oriented x 3 with fluent speech NEURO: no focal motor/sensory deficits   LABORATORY DATA:  I have reviewed the data as listed .    Latest Ref Rng & Units 10/29/2023    1:00 PM 10/16/2023    8:23 AM 09/30/2023   10:33 AM  CBC  WBC 4.0 - 10.5 K/uL 4.1  2.7  5.3   Hemoglobin 13.0 - 17.0 g/dL 40.9  81.1  91.4   Hematocrit 39.0 - 52.0 % 43.6  43.3  43.9   Platelets 150 - 400 K/uL 183  164  168       Latest Ref Rng & Units 10/29/2023    1:00 PM 10/16/2023    8:23 AM 09/30/2023   10:33 AM  CMP  Glucose 70 - 99 mg/dL 96  81  77   BUN 8 - 23 mg/dL 11  11  9    Creatinine 0.61 - 1.24 mg/dL 7.82  9.56  2.13   Sodium 135 - 145 mmol/L 137  140  137   Potassium 3.5 - 5.1 mmol/L 3.8  3.8  3.7   Chloride 98 - 111 mmol/L 107  108  105   CO2 22 - 32 mmol/L 24  26  26    Calcium 8.9 - 10.3 mg/dL 8.7  9.0  8.8   Total Protein 6.5 - 8.1 g/dL 6.3  6.3  6.4   Total Bilirubin 0.0 - 1.2 mg/dL 0.8  0.8  0.9   Alkaline Phos 38 - 126 U/L 44  44  44   AST 15 - 41 U/L 12  11  12    ALT 0 - 44 U/L 10  8  12       PATHOLOGY Surgical Pathology CASE: [id number] PATIENT: Anthony [name] Bone Marrow Report     Clinical History: Multiple Myeloma     DIAGNOSIS:  BONE MARROW, ASPIRATE, CLOT, CORE: -Hypercellular bone marrow (60%) with erythroid predominant trilineage hematopoiesis and no evidence of residual plasma cell neoplasm (less than 1% plasma cells by manual aspirate  differential, and CD138 immunohistochemical analysis of clot and core sections).  PERIPHERAL BLOOD: -Leukopenia  MICROSCOPIC DESCRIPTION:  PERIPHERAL BLOOD SMEAR: Platelets: Adequate in number, no platelet clumps identified Erythroid: Normocytic normochromic red blood cells Leukocytes: Mild leukopenia, negative for dysplastic granulocytes, blasts or  plasma cells  BONE MARROW ASPIRATE: Cellular Erythroid precursors: Relatively increased, show a full sequence of maturation with occasional abnormal forms (including binucleate forms, megaloblastoid change and nuclear membrane irregularities) Granulocytic precursors: Decreased, show a full sequence of generally orderly maturation Megakaryocytes: Normal in number and morphology Lymphocytes/plasma cells: Not increased   02/06/2022 Molecular pathology   RADIOGRAPHIC STUDIES: I have personally reviewed the radiological images as listed and agreed with the findings in the report. No results found.   ASSESSMENT & PLAN:   63 y.o. very pleasant male with  1. R-ISS stage III multiple myeloma  with extensive bone metastases and patholgiic fracture rt shoulder -Recent bone marrow biopsy and aspiration done 02/06/2022 revealed hypercellular bone marrow with plasma cell neoplasm. Cytogenetics-normal karyotype Molecular cytogenetics-TP53 deletion and duplication of 1 q.Marland Kitchen  PLAN: -Discussed lab results from today, 10/29/2023, in detail with the patient. CBC stable. CMP pending.  -Multiple myeloma panel results from 10/16/2023: M-protein continues to be undetectable.  -Discussed with the patient that from the myeloma stand-point, there is no need for a bone marrow biopsy or a PET scan currently.  -Discussed with the patient to talk to a chiropractor about the risks and benefits.  -Discussed the option of cutting down the fentanyl patch dosage. Patient agrees.    -We will reduce the fentanyl patch to 12.5 mg.  -Recommend to follow-up with PCP  regarding being lethargic after waking up in the morning and manage Cpap machine. -Recommend to stay physically active to help with fatigue and mental health.  -Recommend to eat well and stay well-hydrated.  -no clinical sign or evidence of multiple myeloma progression at this time -continue maintenance Revlimid treatment and maintenance Carfilzomib -continue 1,000 mcg daily vitamin B12 . -continue B complex supplements. -continue vitamin D once a week. -answered all of patient's and his wife's questions in detail.  FOLLOW-UP: -Plz continue Carfilzomib per integrated scheduling with port flush and labs -MD visit in 6 weeks   The total time spent in the appointment was 32 minutes* .  All of the patient's questions were answered with apparent satisfaction. The patient knows to call the clinic with any problems, questions or concerns.   Wyvonnia Lora MD MS AAHIVMS [facility] Hematology/Oncology Physician [facility]  .*Total Encounter Time as defined by the Centers for Medicare and Medicaid Services includes, in addition to the face-to-face time of a patient visit (documented in the note above) non-face-to-face time: obtaining and reviewing outside history, ordering and reviewing medications, tests or procedures, care coordination (communications with other health care professionals or caregivers) and documentation in the medical record.   I,Param Shah,acting as a Neurosurgeon for Wyvonnia Lora, MD.,have documented all relevant documentation on the behalf of Wyvonnia Lora, MD,as directed by  Wyvonnia Lora, MD while in the presence of Wyvonnia Lora, MD.  .I have reviewed the above documentation for accuracy and completeness, and I agree with the above. Johney Maine MD

## 2023-10-31 ENCOUNTER — Other Ambulatory Visit: Payer: Self-pay

## 2023-11-03 ENCOUNTER — Encounter: Payer: Self-pay | Admitting: Hematology

## 2023-11-06 ENCOUNTER — Other Ambulatory Visit: Payer: Self-pay | Admitting: Hematology

## 2023-11-06 DIAGNOSIS — G4733 Obstructive sleep apnea (adult) (pediatric): Secondary | ICD-10-CM | POA: Diagnosis not present

## 2023-11-06 DIAGNOSIS — C9000 Multiple myeloma not having achieved remission: Secondary | ICD-10-CM

## 2023-11-10 ENCOUNTER — Telehealth: Payer: Self-pay

## 2023-11-10 ENCOUNTER — Other Ambulatory Visit: Payer: Self-pay

## 2023-11-10 ENCOUNTER — Inpatient Hospital Stay

## 2023-11-10 NOTE — Telephone Encounter (Signed)
 [facility] CSW Progress Note  Clinical Social Worker  called patient on this date  for scheduled counseling appointment. Patient reported continued reduction of symptoms and preferred to cancel today's appointment. Next appointment 3/30    Marguerita Merles, LCSW Clinical Social Worker [facility]

## 2023-11-11 DIAGNOSIS — N401 Enlarged prostate with lower urinary tract symptoms: Secondary | ICD-10-CM | POA: Diagnosis not present

## 2023-11-11 DIAGNOSIS — R3914 Feeling of incomplete bladder emptying: Secondary | ICD-10-CM | POA: Diagnosis not present

## 2023-11-11 DIAGNOSIS — G4733 Obstructive sleep apnea (adult) (pediatric): Secondary | ICD-10-CM | POA: Diagnosis not present

## 2023-11-14 ENCOUNTER — Inpatient Hospital Stay: Payer: BC Managed Care – PPO

## 2023-11-14 VITALS — BP 144/91 | HR 54 | Temp 98.2°F | Resp 16 | Wt 187.5 lb

## 2023-11-14 DIAGNOSIS — R0602 Shortness of breath: Secondary | ICD-10-CM | POA: Diagnosis not present

## 2023-11-14 DIAGNOSIS — Z7189 Other specified counseling: Secondary | ICD-10-CM

## 2023-11-14 DIAGNOSIS — Z87891 Personal history of nicotine dependence: Secondary | ICD-10-CM | POA: Diagnosis not present

## 2023-11-14 DIAGNOSIS — G4730 Sleep apnea, unspecified: Secondary | ICD-10-CM | POA: Diagnosis not present

## 2023-11-14 DIAGNOSIS — M25511 Pain in right shoulder: Secondary | ICD-10-CM | POA: Diagnosis not present

## 2023-11-14 DIAGNOSIS — C9000 Multiple myeloma not having achieved remission: Secondary | ICD-10-CM

## 2023-11-14 DIAGNOSIS — R5383 Other fatigue: Secondary | ICD-10-CM | POA: Diagnosis not present

## 2023-11-14 DIAGNOSIS — Z5112 Encounter for antineoplastic immunotherapy: Secondary | ICD-10-CM | POA: Diagnosis not present

## 2023-11-14 DIAGNOSIS — Z809 Family history of malignant neoplasm, unspecified: Secondary | ICD-10-CM | POA: Diagnosis not present

## 2023-11-14 DIAGNOSIS — Z79891 Long term (current) use of opiate analgesic: Secondary | ICD-10-CM | POA: Diagnosis not present

## 2023-11-14 DIAGNOSIS — G8929 Other chronic pain: Secondary | ICD-10-CM | POA: Diagnosis not present

## 2023-11-14 DIAGNOSIS — Z808 Family history of malignant neoplasm of other organs or systems: Secondary | ICD-10-CM | POA: Diagnosis not present

## 2023-11-14 DIAGNOSIS — R059 Cough, unspecified: Secondary | ICD-10-CM | POA: Diagnosis not present

## 2023-11-14 DIAGNOSIS — M545 Low back pain, unspecified: Secondary | ICD-10-CM | POA: Diagnosis not present

## 2023-11-14 LAB — CMP (CANCER CENTER ONLY)
ALT: 8 U/L (ref 0–44)
AST: 9 U/L — ABNORMAL LOW (ref 15–41)
Albumin: 4.2 g/dL (ref 3.5–5.0)
Alkaline Phosphatase: 42 U/L (ref 38–126)
Anion gap: 6 (ref 5–15)
BUN: 14 mg/dL (ref 8–23)
CO2: 25 mmol/L (ref 22–32)
Calcium: 9.2 mg/dL (ref 8.9–10.3)
Chloride: 107 mmol/L (ref 98–111)
Creatinine: 0.91 mg/dL (ref 0.61–1.24)
GFR, Estimated: >60 mL/min (ref >60)
Glucose, Bld: 113 mg/dL — ABNORMAL HIGH (ref 70–99)
Potassium: 3.9 mmol/L (ref 3.5–5.1)
Sodium: 138 mmol/L (ref 135–145)
Total Bilirubin: 0.9 mg/dL (ref 0.0–1.2)
Total Protein: 6.3 g/dL — ABNORMAL LOW (ref 6.5–8.1)

## 2023-11-14 LAB — CBC WITH DIFFERENTIAL (CANCER CENTER ONLY)
Abs Immature Granulocytes: 0.01 10*3/uL (ref 0.00–0.07)
Basophils Absolute: 0 10*3/uL (ref 0.0–0.1)
Basophils Relative: 1 %
Eosinophils Absolute: 0.1 10*3/uL (ref 0.0–0.5)
Eosinophils Relative: 3 %
HCT: 43.3 % (ref 39.0–52.0)
Hemoglobin: 14.4 g/dL (ref 13.0–17.0)
Immature Granulocytes: 0 %
Lymphocytes Relative: 25 %
Lymphs Abs: 0.7 10*3/uL (ref 0.7–4.0)
MCH: 28.1 pg (ref 26.0–34.0)
MCHC: 33.3 g/dL (ref 30.0–36.0)
MCV: 84.6 fL (ref 80.0–100.0)
Monocytes Absolute: 0.5 10*3/uL (ref 0.1–1.0)
Monocytes Relative: 15 %
Neutro Abs: 1.7 10*3/uL (ref 1.7–7.7)
Neutrophils Relative %: 56 %
Platelet Count: 184 10*3/uL (ref 150–400)
RBC: 5.12 MIL/uL (ref 4.22–5.81)
RDW: 14 % (ref 11.5–15.5)
WBC Count: 3 10*3/uL — ABNORMAL LOW (ref 4.0–10.5)
nRBC: 0 % (ref 0.0–0.2)

## 2023-11-14 MED ORDER — SODIUM CHLORIDE 0.9 % IV SOLN
Freq: Once | INTRAVENOUS | Status: AC
Start: 2023-11-14 — End: 2023-11-14

## 2023-11-14 MED ORDER — DEXAMETHASONE SODIUM PHOSPHATE 10 MG/ML IJ SOLN
8.0000 mg | Freq: Once | INTRAMUSCULAR | Status: AC
  Administered 2023-11-14: 8 mg via INTRAVENOUS
  Filled 2023-11-14: qty 1

## 2023-11-14 MED ORDER — DEXTROSE 5 % IV SOLN
70.0000 mg/m2 | Freq: Once | INTRAVENOUS | Status: AC
  Administered 2023-11-14: 140 mg via INTRAVENOUS
  Filled 2023-11-14: qty 60

## 2023-11-14 MED ORDER — PALONOSETRON HCL INJECTION 0.25 MG/5ML
0.2500 mg | Freq: Once | INTRAVENOUS | Status: AC
  Administered 2023-11-14: 0.25 mg via INTRAVENOUS
  Filled 2023-11-14: qty 5

## 2023-11-14 MED ORDER — ACETAMINOPHEN 325 MG PO TABS
650.0000 mg | ORAL_TABLET | Freq: Once | ORAL | Status: AC
  Administered 2023-11-14: 650 mg via ORAL
  Filled 2023-11-14: qty 2

## 2023-11-14 NOTE — Patient Instructions (Signed)
 [facility] - A DEPT OF [facility]  Discharge Instructions: Thank you for choosing [facility] to provide your oncology and hematology care.   If you have a lab appointment with the Cancer Center, please go directly to the Cancer Center and check in at the registration area.   Wear comfortable clothing and clothing appropriate for easy access to any Portacath or PICC line.   We strive to give you quality time with your provider. You may need to reschedule your appointment if you arrive late (15 or more minutes).  Arriving late affects you and other patients whose appointments are after yours.  Also, if you miss three or more appointments without notifying the office, you may be dismissed from the clinic at the provider's discretion.      For prescription refill requests, have your pharmacy contact our office and allow 72 hours for refills to be completed.    Today you received the following chemotherapy and/or immunotherapy agents :  Carfilzomib      To help prevent nausea and vomiting after your treatment, we encourage you to take your nausea medication as directed.  BELOW ARE SYMPTOMS THAT SHOULD BE REPORTED IMMEDIATELY: *FEVER GREATER THAN 100.4 F (38 C) OR HIGHER *CHILLS OR SWEATING *NAUSEA AND VOMITING THAT IS NOT CONTROLLED WITH YOUR NAUSEA MEDICATION *UNUSUAL SHORTNESS OF BREATH *UNUSUAL BRUISING OR BLEEDING *URINARY PROBLEMS (pain or burning when urinating, or frequent urination) *BOWEL PROBLEMS (unusual diarrhea, constipation, pain near the anus) TENDERNESS IN MOUTH AND THROAT WITH OR WITHOUT PRESENCE OF ULCERS (sore throat, sores in mouth, or a toothache) UNUSUAL RASH, SWELLING OR PAIN  UNUSUAL VAGINAL DISCHARGE OR ITCHING   Items with * indicate a potential emergency and should be followed up as soon as possible or go to the Emergency Department if any problems should occur.  Please show the CHEMOTHERAPY ALERT CARD or  IMMUNOTHERAPY ALERT CARD at check-in to the Emergency Department and triage nurse.  Should you have questions after your visit or need to cancel or reschedule your appointment, please contact [facility]  Dept: [phone]  and follow the prompts.  Office hours are 8:00 a.m. to 4:30 p.m. Monday - Friday. Please note that voicemails left after 4:00 p.m. may not be returned until the following business day.  We are closed weekends and major holidays. You have access to a nurse at all times for urgent questions. Please call the main number to the clinic Dept: [phone] and follow the prompts.   For any non-urgent questions, you may also contact your provider using MyChart. We now offer e-Visits for anyone 66 and older to request care online for non-urgent symptoms. For details visit [email or web address].   Also download the MyChart app! Go to the app store, search "MyChart", open the app, select [facility], and log in with your MyChart username and password.  Zoledronic Acid Injection (Cancer) What is this medication? ZOLEDRONIC ACID (ZOE le dron ik AS id) treats high calcium levels in the blood caused by cancer. It may also be used with chemotherapy to treat weakened bones caused by cancer. It works by slowing down the release of calcium from bones. This lowers calcium levels in your blood. It also makes your bones stronger and less likely to break (fracture). It belongs to a group of medications called bisphosphonates. This medicine may be used for other purposes; ask your health  care provider or pharmacist if you have questions. COMMON BRAND NAME(S): Zometa, Zometa Powder What should I tell my care team before I take this medication? They need to know if you have any of these conditions: Dehydration Dental disease Kidney disease Liver disease Low levels of calcium in the blood Lung or breathing disease, such as asthma Receiving  steroids, such as dexamethasone or prednisone An unusual or allergic reaction to zoledronic acid, other medications, foods, dyes, or preservatives Pregnant or trying to get pregnant Breast-feeding How should I use this medication? This medication is injected into a vein. It is given by your care team in a hospital or clinic setting. Talk to your care team about the use of this medication in children. Special care may be needed. Overdosage: If you think you have taken too much of this medicine contact a poison control center or emergency room at once. NOTE: This medicine is only for you. Do not share this medicine with others. What if I miss a dose? Keep appointments for follow-up doses. It is important not to miss your dose. Call your care team if you are unable to keep an appointment. What may interact with this medication? Certain antibiotics given by injection Diuretics, such as bumetanide, furosemide NSAIDs, medications for pain and inflammation, such as ibuprofen or naproxen Teriparatide Thalidomide This list may not describe all possible interactions. Give your health care provider a list of all the medicines, herbs, non-prescription drugs, or dietary supplements you use. Also tell them if you smoke, drink alcohol, or use illegal drugs. Some items may interact with your medicine. What should I watch for while using this medication? Visit your care team for regular checks on your progress. It may be some time before you see the benefit from this medication. Some people who take this medication have severe bone, joint, or muscle pain. This medication may also increase your risk for jaw problems or a broken thigh bone. Tell your care team right away if you have severe pain in your jaw, bones, joints, or muscles. Tell you care team if you have any pain that does not go away or that gets worse. Tell your dentist and dental surgeon that you are taking this medication. You should not have major  dental surgery while on this medication. See your dentist to have a dental exam and fix any dental problems before starting this medication. Take good care of your teeth while on this medication. Make sure you see your dentist for regular follow-up appointments. You should make sure you get enough calcium and vitamin D while you are taking this medication. Discuss the foods you eat and the vitamins you take with your care team. Check with your care team if you have severe diarrhea, nausea, and vomiting, or if you sweat a lot. The loss of too much body fluid may make it dangerous for you to take this medication. You may need bloodwork while taking this medication. Talk to your care team if you wish to become pregnant or think you might be pregnant. This medication can cause serious birth defects. What side effects may I notice from receiving this medication? Side effects that you should report to your care team as soon as possible: Allergic reactions--skin rash, itching, hives, swelling of the face, lips, tongue, or throat Kidney injury--decrease in the amount of urine, swelling of the ankles, hands, or feet Low calcium level--muscle pain or cramps, confusion, tingling, or numbness in the hands or feet Osteonecrosis of the jaw--pain, swelling,  or redness in the mouth, numbness of the jaw, poor healing after dental work, unusual discharge from the mouth, visible bones in the mouth Severe bone, joint, or muscle pain Side effects that usually do not require medical attention (report to your care team if they continue or are bothersome): Constipation Fatigue Fever Loss of appetite Nausea Stomach pain This list may not describe all possible side effects. Call your doctor for medical advice about side effects. You may report side effects to FDA at 1-800-FDA-1088. Where should I keep my medication? This medication is given in a hospital or clinic. It will not be stored at home. NOTE: This sheet is a  summary. It may not cover all possible information. If you have questions about this medicine, talk to your doctor, pharmacist, or health care provider.  2024 Elsevier/Gold Standard (2021-09-28 00:00:00)

## 2023-11-18 DIAGNOSIS — R3914 Feeling of incomplete bladder emptying: Secondary | ICD-10-CM | POA: Diagnosis not present

## 2023-11-18 DIAGNOSIS — N401 Enlarged prostate with lower urinary tract symptoms: Secondary | ICD-10-CM | POA: Diagnosis not present

## 2023-11-18 DIAGNOSIS — R972 Elevated prostate specific antigen [PSA]: Secondary | ICD-10-CM | POA: Diagnosis not present

## 2023-11-18 LAB — MULTIPLE MYELOMA PANEL, SERUM
Albumin SerPl Elph-Mcnc: 3.7 g/dL (ref 2.9–4.4)
Albumin/Glob SerPl: 1.8 — ABNORMAL HIGH (ref 0.7–1.7)
Alpha 1: 0.2 g/dL (ref 0.0–0.4)
Alpha2 Glob SerPl Elph-Mcnc: 0.6 g/dL (ref 0.4–1.0)
B-Globulin SerPl Elph-Mcnc: 0.9 g/dL (ref 0.7–1.3)
Gamma Glob SerPl Elph-Mcnc: 0.4 g/dL (ref 0.4–1.8)
Globulin, Total: 2.1 g/dL — ABNORMAL LOW (ref 2.2–3.9)
IgA: 21 mg/dL — ABNORMAL LOW (ref 61–437)
IgG (Immunoglobin G), Serum: 503 mg/dL — ABNORMAL LOW (ref 603–1613)
IgM (Immunoglobulin M), Srm: 15 mg/dL — ABNORMAL LOW (ref 20–172)
Total Protein ELP: 5.8 g/dL — ABNORMAL LOW (ref 6.0–8.5)

## 2023-11-28 ENCOUNTER — Encounter: Payer: BC Managed Care – PPO | Admitting: Dietician

## 2023-11-28 ENCOUNTER — Inpatient Hospital Stay (HOSPITAL_BASED_OUTPATIENT_CLINIC_OR_DEPARTMENT_OTHER): Payer: BC Managed Care – PPO | Admitting: Physician Assistant

## 2023-11-28 ENCOUNTER — Other Ambulatory Visit: Payer: Self-pay

## 2023-11-28 ENCOUNTER — Inpatient Hospital Stay: Payer: BC Managed Care – PPO

## 2023-11-28 ENCOUNTER — Inpatient Hospital Stay: Payer: BC Managed Care – PPO | Attending: Physician Assistant

## 2023-11-28 VITALS — BP 125/82 | HR 60 | Temp 97.8°F | Ht 63.0 in | Wt 186.5 lb

## 2023-11-28 DIAGNOSIS — C9000 Multiple myeloma not having achieved remission: Secondary | ICD-10-CM | POA: Diagnosis not present

## 2023-11-28 DIAGNOSIS — Z7952 Long term (current) use of systemic steroids: Secondary | ICD-10-CM | POA: Diagnosis not present

## 2023-11-28 DIAGNOSIS — Z808 Family history of malignant neoplasm of other organs or systems: Secondary | ICD-10-CM | POA: Insufficient documentation

## 2023-11-28 DIAGNOSIS — Z7189 Other specified counseling: Secondary | ICD-10-CM

## 2023-11-28 DIAGNOSIS — Z5112 Encounter for antineoplastic immunotherapy: Secondary | ICD-10-CM | POA: Diagnosis not present

## 2023-11-28 DIAGNOSIS — Z809 Family history of malignant neoplasm, unspecified: Secondary | ICD-10-CM | POA: Diagnosis not present

## 2023-11-28 DIAGNOSIS — Z87891 Personal history of nicotine dependence: Secondary | ICD-10-CM | POA: Insufficient documentation

## 2023-11-28 DIAGNOSIS — Z79891 Long term (current) use of opiate analgesic: Secondary | ICD-10-CM | POA: Insufficient documentation

## 2023-11-28 DIAGNOSIS — M545 Low back pain, unspecified: Secondary | ICD-10-CM | POA: Diagnosis not present

## 2023-11-28 LAB — CMP (CANCER CENTER ONLY)
ALT: 9 U/L (ref 0–44)
AST: 11 U/L — ABNORMAL LOW (ref 15–41)
Albumin: 4.3 g/dL (ref 3.5–5.0)
Alkaline Phosphatase: 41 U/L (ref 38–126)
Anion gap: 6 (ref 5–15)
BUN: 10 mg/dL (ref 8–23)
CO2: 25 mmol/L (ref 22–32)
Calcium: 9 mg/dL (ref 8.9–10.3)
Chloride: 106 mmol/L (ref 98–111)
Creatinine: 0.87 mg/dL (ref 0.61–1.24)
GFR, Estimated: >60 mL/min (ref >60)
Glucose, Bld: 110 mg/dL — ABNORMAL HIGH (ref 70–99)
Potassium: 3.9 mmol/L (ref 3.5–5.1)
Sodium: 137 mmol/L (ref 135–145)
Total Bilirubin: 1 mg/dL (ref 0.0–1.2)
Total Protein: 6.3 g/dL — ABNORMAL LOW (ref 6.5–8.1)

## 2023-11-28 LAB — CBC WITH DIFFERENTIAL (CANCER CENTER ONLY)
Abs Immature Granulocytes: 0.01 10*3/uL (ref 0.00–0.07)
Basophils Absolute: 0 10*3/uL (ref 0.0–0.1)
Basophils Relative: 1 %
Eosinophils Absolute: 0.1 10*3/uL (ref 0.0–0.5)
Eosinophils Relative: 3 %
HCT: 43.3 % (ref 39.0–52.0)
Hemoglobin: 14.3 g/dL (ref 13.0–17.0)
Immature Granulocytes: 0 %
Lymphocytes Relative: 19 %
Lymphs Abs: 0.8 10*3/uL (ref 0.7–4.0)
MCH: 27.5 pg (ref 26.0–34.0)
MCHC: 33 g/dL (ref 30.0–36.0)
MCV: 83.3 fL (ref 80.0–100.0)
Monocytes Absolute: 0.6 10*3/uL (ref 0.1–1.0)
Monocytes Relative: 14 %
Neutro Abs: 2.7 10*3/uL (ref 1.7–7.7)
Neutrophils Relative %: 63 %
Platelet Count: 163 10*3/uL (ref 150–400)
RBC: 5.2 MIL/uL (ref 4.22–5.81)
RDW: 14 % (ref 11.5–15.5)
WBC Count: 4.3 10*3/uL (ref 4.0–10.5)
nRBC: 0 % (ref 0.0–0.2)

## 2023-11-28 MED ORDER — DEXTROSE 5 % IV SOLN
70.0000 mg/m2 | Freq: Once | INTRAVENOUS | Status: AC
  Administered 2023-11-28: 140 mg via INTRAVENOUS
  Filled 2023-11-28: qty 60

## 2023-11-28 MED ORDER — DEXAMETHASONE SODIUM PHOSPHATE 10 MG/ML IJ SOLN
8.0000 mg | Freq: Once | INTRAMUSCULAR | Status: AC
  Administered 2023-11-28: 8 mg via INTRAVENOUS
  Filled 2023-11-28: qty 1

## 2023-11-28 MED ORDER — SODIUM CHLORIDE 0.9 % IV SOLN: Freq: Once | INTRAVENOUS | Status: AC

## 2023-11-28 MED ORDER — ZOLEDRONIC ACID 4 MG/100ML IV SOLN
4.0000 mg | Freq: Once | INTRAVENOUS | Status: AC
Start: 2023-11-28 — End: 2023-11-28
  Administered 2023-11-28: 4 mg via INTRAVENOUS
  Filled 2023-11-28: qty 100

## 2023-11-28 MED ORDER — FENTANYL 12 MCG/HR TD PT72: 1.0000 | MEDICATED_PATCH | TRANSDERMAL | 0 refills | Status: DC

## 2023-11-28 MED ORDER — SODIUM CHLORIDE 0.9 % IV SOLN: Freq: Once | INTRAVENOUS | Status: DC

## 2023-11-28 MED ORDER — PALONOSETRON HCL INJECTION 0.25 MG/5ML
0.2500 mg | Freq: Once | INTRAVENOUS | Status: AC
  Administered 2023-11-28: 0.25 mg via INTRAVENOUS
  Filled 2023-11-28: qty 5

## 2023-11-28 NOTE — Patient Instructions (Signed)
 [facility] - A DEPT OF [facility]  Discharge Instructions: Thank you for choosing [facility] to provide your oncology and hematology care.   If you have a lab appointment with the Cancer Center, please go directly to the Cancer Center and check in at the registration area.   Wear comfortable clothing and clothing appropriate for easy access to any Portacath or PICC line.   We strive to give you quality time with your provider. You may need to reschedule your appointment if you arrive late (15 or more minutes).  Arriving late affects you and other patients whose appointments are after yours.  Also, if you miss three or more appointments without notifying the office, you may be dismissed from the clinic at the provider's discretion.      For prescription refill requests, have your pharmacy contact our office and allow 72 hours for refills to be completed.    Today you received the following chemotherapy and/or immunotherapy agents :  Carfilzomib      To help prevent nausea and vomiting after your treatment, we encourage you to take your nausea medication as directed.  BELOW ARE SYMPTOMS THAT SHOULD BE REPORTED IMMEDIATELY: *FEVER GREATER THAN 100.4 F (38 C) OR HIGHER *CHILLS OR SWEATING *NAUSEA AND VOMITING THAT IS NOT CONTROLLED WITH YOUR NAUSEA MEDICATION *UNUSUAL SHORTNESS OF BREATH *UNUSUAL BRUISING OR BLEEDING *URINARY PROBLEMS (pain or burning when urinating, or frequent urination) *BOWEL PROBLEMS (unusual diarrhea, constipation, pain near the anus) TENDERNESS IN MOUTH AND THROAT WITH OR WITHOUT PRESENCE OF ULCERS (sore throat, sores in mouth, or a toothache) UNUSUAL RASH, SWELLING OR PAIN  UNUSUAL VAGINAL DISCHARGE OR ITCHING   Items with * indicate a potential emergency and should be followed up as soon as possible or go to the Emergency Department if any problems should occur.  Please show the CHEMOTHERAPY ALERT CARD or  IMMUNOTHERAPY ALERT CARD at check-in to the Emergency Department and triage nurse.  Should you have questions after your visit or need to cancel or reschedule your appointment, please contact [facility]  Dept: [phone]  and follow the prompts.  Office hours are 8:00 a.m. to 4:30 p.m. Monday - Friday. Please note that voicemails left after 4:00 p.m. may not be returned until the following business day.  We are closed weekends and major holidays. You have access to a nurse at all times for urgent questions. Please call the main number to the clinic Dept: [phone] and follow the prompts.   For any non-urgent questions, you may also contact your provider using MyChart. We now offer e-Visits for anyone 66 and older to request care online for non-urgent symptoms. For details visit [email or web address].   Also download the MyChart app! Go to the app store, search "MyChart", open the app, select [facility], and log in with your MyChart username and password.  Zoledronic Acid Injection (Cancer) What is this medication? ZOLEDRONIC ACID (ZOE le dron ik AS id) treats high calcium levels in the blood caused by cancer. It may also be used with chemotherapy to treat weakened bones caused by cancer. It works by slowing down the release of calcium from bones. This lowers calcium levels in your blood. It also makes your bones stronger and less likely to break (fracture). It belongs to a group of medications called bisphosphonates. This medicine may be used for other purposes; ask your health  care provider or pharmacist if you have questions. COMMON BRAND NAME(S): Zometa, Zometa Powder What should I tell my care team before I take this medication? They need to know if you have any of these conditions: Dehydration Dental disease Kidney disease Liver disease Low levels of calcium in the blood Lung or breathing disease, such as asthma Receiving  steroids, such as dexamethasone or prednisone An unusual or allergic reaction to zoledronic acid, other medications, foods, dyes, or preservatives Pregnant or trying to get pregnant Breast-feeding How should I use this medication? This medication is injected into a vein. It is given by your care team in a hospital or clinic setting. Talk to your care team about the use of this medication in children. Special care may be needed. Overdosage: If you think you have taken too much of this medicine contact a poison control center or emergency room at once. NOTE: This medicine is only for you. Do not share this medicine with others. What if I miss a dose? Keep appointments for follow-up doses. It is important not to miss your dose. Call your care team if you are unable to keep an appointment. What may interact with this medication? Certain antibiotics given by injection Diuretics, such as bumetanide, furosemide NSAIDs, medications for pain and inflammation, such as ibuprofen or naproxen Teriparatide Thalidomide This list may not describe all possible interactions. Give your health care provider a list of all the medicines, herbs, non-prescription drugs, or dietary supplements you use. Also tell them if you smoke, drink alcohol, or use illegal drugs. Some items may interact with your medicine. What should I watch for while using this medication? Visit your care team for regular checks on your progress. It may be some time before you see the benefit from this medication. Some people who take this medication have severe bone, joint, or muscle pain. This medication may also increase your risk for jaw problems or a broken thigh bone. Tell your care team right away if you have severe pain in your jaw, bones, joints, or muscles. Tell you care team if you have any pain that does not go away or that gets worse. Tell your dentist and dental surgeon that you are taking this medication. You should not have major  dental surgery while on this medication. See your dentist to have a dental exam and fix any dental problems before starting this medication. Take good care of your teeth while on this medication. Make sure you see your dentist for regular follow-up appointments. You should make sure you get enough calcium and vitamin D while you are taking this medication. Discuss the foods you eat and the vitamins you take with your care team. Check with your care team if you have severe diarrhea, nausea, and vomiting, or if you sweat a lot. The loss of too much body fluid may make it dangerous for you to take this medication. You may need bloodwork while taking this medication. Talk to your care team if you wish to become pregnant or think you might be pregnant. This medication can cause serious birth defects. What side effects may I notice from receiving this medication? Side effects that you should report to your care team as soon as possible: Allergic reactions--skin rash, itching, hives, swelling of the face, lips, tongue, or throat Kidney injury--decrease in the amount of urine, swelling of the ankles, hands, or feet Low calcium level--muscle pain or cramps, confusion, tingling, or numbness in the hands or feet Osteonecrosis of the jaw--pain, swelling,  or redness in the mouth, numbness of the jaw, poor healing after dental work, unusual discharge from the mouth, visible bones in the mouth Severe bone, joint, or muscle pain Side effects that usually do not require medical attention (report to your care team if they continue or are bothersome): Constipation Fatigue Fever Loss of appetite Nausea Stomach pain This list may not describe all possible side effects. Call your doctor for medical advice about side effects. You may report side effects to FDA at 1-800-FDA-1088. Where should I keep my medication? This medication is given in a hospital or clinic. It will not be stored at home. NOTE: This sheet is a  summary. It may not cover all possible information. If you have questions about this medicine, talk to your doctor, pharmacist, or health care provider.  2024 Elsevier/Gold Standard (2021-09-28 00:00:00)

## 2023-11-28 NOTE — Progress Notes (Signed)
 HEMATOLOGY/ONCOLOGY CLINIC NOTE  Date of Service: 11/28/2023   Patient Care Team: Milus Height, [place] as PCP - General (Nurse Practitioner)  CHIEF COMPLAINTS/PURPOSE OF CONSULTATION:  Multiple myeloma  INTERVAL HISTORY:  Anthony [name] is a 63 y.o. male who presents for a follow up visit for continued management of multiple myeloma. He was last seen by Dr. Candise Che on 10/29/2023. He presents today before Cycle 21, Day 15 of Carfilzomib/Revimid/Dexamethasone treatment.  Mr. Haji reports overall he is doing well without any significant changes to his health.  His energy levels have improved since decreasing his fentanyl dose from 25 mcg to 12.5 mcg.  He has discontinued oxycodone as well a few months ago.  He reports his back pain has stabilized and is manageable with his current pain regimen.  He is eating well without any significant weight changes.  He denies nausea, vomiting or bowel habit changes.  He denies easy bruising or signs of active bleeding.He denies fevers, chill, night sweats, shortness of breath, chest pain or cough. He has no other complaints.   MEDICAL HISTORY:  Past Medical History:  Diagnosis Date   Back pain    Cancer (HCC)    Multiple myeloma (HCC) 02/15/2022    SURGICAL HISTORY: Past Surgical History:  Procedure Laterality Date   IR BONE TUMOR(S)RF ABLATION  05/07/2022   IR BONE TUMOR(S)RF ABLATION  05/07/2022   IR BONE TUMOR(S)RF ABLATION  05/07/2022   IR KYPHO EA ADDL LEVEL THORACIC OR LUMBAR  05/07/2022   IR KYPHO LUMBAR INC FX REDUCE BONE BX UNI/BIL CANNULATION INC/IMAGING  05/07/2022   IR KYPHO THORACIC WITH BONE BIOPSY  05/07/2022   IR RADIOLOGIST EVAL & MGMT  04/23/2022   IR RADIOLOGIST EVAL & MGMT  05/17/2022   IR RADIOLOGIST EVAL & MGMT  06/06/2022    SOCIAL HISTORY: Social History   Socioeconomic History   Marital status: Married    Spouse name: Not on file   Number of children: Not on file   Years of education: Not on file   Highest education  level: Not on file  Occupational History   Not on file  Tobacco Use   Smoking status: Never   Smokeless tobacco: Former    Types: Chew, Snuff  Vaping Use   Vaping status: Never Used  Substance and Sexual Activity   Alcohol use: Yes    Alcohol/week: 2.0 standard drinks of alcohol    Types: 2 Glasses of wine per week   Drug use: No   Sexual activity: Yes  Other Topics Concern   Not on file  Social History Narrative   Not on file   Social Drivers of Health   Financial Resource Strain: Low Risk  (05/07/2022)   Overall Financial Resource Strain (CARDIA)    Difficulty of Paying Living Expenses: Not very hard  Food Insecurity: No Food Insecurity (05/07/2022)   Hunger Vital Sign    Worried About Running Out of Food in the Last Year: Never true    Ran Out of Food in the Last Year: Never true  Transportation Needs: No Transportation Needs (05/07/2022)   PRAPARE - Administrator, Civil Service (Medical): No    Lack of Transportation (Non-Medical): No  Physical Activity: Not on file  Stress: Not on file  Social Connections: Not on file  Intimate Partner Violence: Unknown (05/07/2022)   Humiliation, Afraid, Rape, and Kick questionnaire    Fear of Current or Ex-Partner: No    Emotionally Abused: No  Physically Abused: No    Sexually Abused: Not on file    FAMILY HISTORY: Family History  Problem Relation Age of Onset   Rheum arthritis Mother    Other Sister        Pre-cancerous uterine mass;    Rheum arthritis Sister    Bone cancer Paternal Grandfather    Brain cancer Maternal Uncle    Brain cancer Maternal Aunt    Osteoporosis Neg Hx     ALLERGIES:  has no known allergies.  MEDICATIONS:  Current Outpatient Medications  Medication Sig Dispense Refill   acyclovir (ZOVIRAX) 400 MG tablet Take 1 tablet (400 mg total) by mouth 2 (two) times daily. 60 tablet 11   aluminum chloride (DRYSOL) 20 % external solution Apply 1 application topically at bedtime.      apixaban (ELIQUIS) 5 MG TABS tablet Take 1 tablet (5 mg total) by mouth 2 (two) times daily. 60 tablet 5   calcium-vitamin D (OSCAL WITH D) 250-125 MG-UNIT tablet Take 1 tablet by mouth daily.     cyanocobalamin (VITAMIN B12) 1000 MCG tablet Take 1 tablet (1,000 mcg total) by mouth daily. 30 tablet 3   diclofenac sodium (VOLTAREN) 1 % GEL Apply 2 g topically as needed.     ergocalciferol (VITAMIN D2) 1.25 MG (50000 UT) capsule Take 1 capsule (50,000 Units total) by mouth 2 (two) times a week. (Patient taking differently: Take 50,000 Units by mouth once a week.) 12 capsule 2   esomeprazole (NEXIUM) 40 MG capsule Take 1 capsule (40 mg total) by mouth daily. Take 1 capsule (40 mg total) daily before breakfast 30 capsule 3   famotidine (PEPCID) 40 MG tablet Take 40 mg by mouth daily.     lidocaine (LIDODERM) 5 % Place 1 patch onto the skin daily. Remove & Discard patch within 12 hours or as directed by MD 30 patch 0   loratadine (CLARITIN) 10 MG tablet Take 10 mg by mouth daily.     LORazepam (ATIVAN) 0.5 MG tablet Take 1 tablet (0.5 mg total) by mouth every 6 (six) hours as needed (Nausea or vomiting). 30 tablet 0   metroNIDAZOLE (METROGEL) 1 % gel Apply topically daily. 45 g 0   Multiple Vitamin (MULTIVITAMIN) tablet Take 1 tablet by mouth daily.     Omega-3 Fatty Acids (FISH OIL) 1360 MG CAPS Take 1 capsule by mouth daily.     ondansetron (ZOFRAN) 8 MG tablet Take 1 tablet (8 mg total) by mouth every 8 (eight) hours as needed for nausea or vomiting. 30 tablet 0   oxyCODONE (ROXICODONE) 5 MG immediate release tablet Take 1 tablet (5 mg total) by mouth every 4 (four) hours as needed for severe pain (pain score 7-10) or moderate pain (pain score 4-6). Take 1 to 2 tablets every 4 hours as needed for moderate to severe pain. 60 tablet 0   prochlorperazine (COMPAZINE) 10 MG tablet TAKE ONE TABLET BY MOUTH EVERY 6 HOURS AS NEEDED FOR NAUSEA AND VOMITING 30 tablet 1   REVLIMID 15 MG capsule TAKE 1 CAPSULE  DAILY FOR 21 DAYS ON, THEN 7 DAYS OFF 21 capsule 0   senna-docusate (SENNA S) 8.6-50 MG tablet Take 2 tablets by mouth at bedtime. 60 tablet 1   terbinafine (LAMISIL) 1 % cream Apply 1 application topically 2 (two) times daily.     cyclobenzaprine (FLEXERIL) 10 MG tablet Take 10 mg by mouth 3 (three) times daily as needed for muscle spasms. (Patient not taking: Reported on 09/30/2023)  dronabinol (MARINOL) 10 MG capsule Take 1 capsule (10 mg total) by mouth 2 (two) times daily before a meal. (Patient not taking: Reported on 09/16/2023) 60 capsule 0   escitalopram (LEXAPRO) 10 MG tablet Take 10 mg by mouth daily. (Patient not taking: Reported on 11/28/2023)     fentaNYL (DURAGESIC) 12 MCG/HR Place 1 patch onto the skin every 3 (three) days. 10 patch 0   methocarbamol (ROBAXIN) 500 MG tablet Take 1 tablet (500 mg total) by mouth every 8 (eight) hours as needed for muscle spasms. (Patient not taking: Reported on 09/30/2023) 30 tablet 0   methylPREDNISolone (MEDROL DOSEPAK) 4 MG TBPK tablet Take 6 pills by mouth day 1, 5 on day 2, 4 on day 3, 3 on day 4, 2 on day 5, 1 on day 6 (Patient not taking: Reported on 11/28/2023) 21 tablet 0   No current facility-administered medications for this visit.   Facility-Administered Medications Ordered in Other Visits  Medication Dose Route Frequency Provider Last Rate Last Admin   0.9 %  sodium chloride infusion   Intravenous Once Johney Maine, MD   Stopped at 11/28/23 1115    10 Point review of Systems was done is negative except as noted above.  PHYSICAL EXAMINATION: Vitals:   11/28/23 0956  BP: 125/82  Pulse: 60  Temp: 97.8 F (36.6 C)  SpO2: 100%   NAD GENERAL:alert, in no acute distress and comfortable SKIN: no acute rashes, no significant lesions EYES: conjunctiva are pink and non-injected, sclera anicteric NECK: supple, no JVD LUNGS: clear to auscultation b/l with normal respiratory effort HEART: regular rate & rhythm Extremity: no  pedal edema PSYCH: alert & oriented x 3 with fluent speech NEURO: no focal motor/sensory deficits    LABORATORY DATA:  I have reviewed the data as listed .    Latest Ref Rng & Units 11/28/2023    9:43 AM 11/14/2023    8:27 AM 10/29/2023    1:00 PM  CBC  WBC 4.0 - 10.5 K/uL 4.3  3.0  4.1   Hemoglobin 13.0 - 17.0 g/dL 29.9  37.1  69.6   Hematocrit 39.0 - 52.0 % 43.3  43.3  43.6   Platelets 150 - 400 K/uL 163  184  183       Latest Ref Rng & Units 11/28/2023    9:43 AM 11/14/2023    8:27 AM 10/29/2023    1:00 PM  CMP  Glucose 70 - 99 mg/dL 789  381  96   BUN 8 - 23 mg/dL 10  14  11    Creatinine 0.61 - 1.24 mg/dL 0.17  5.10  2.58   Sodium 135 - 145 mmol/L 137  138  137   Potassium 3.5 - 5.1 mmol/L 3.9  3.9  3.8   Chloride 98 - 111 mmol/L 106  107  107   CO2 22 - 32 mmol/L 25  25  24    Calcium 8.9 - 10.3 mg/dL 9.0  9.2  8.7   Total Protein 6.5 - 8.1 g/dL 6.3  6.3  6.3   Total Bilirubin 0.0 - 1.2 mg/dL 1.0  0.9  0.8   Alkaline Phos 38 - 126 U/L 41  42  44   AST 15 - 41 U/L 11  9  12    ALT 0 - 44 U/L 9  8  10      02/06/2022 Molecular pathology   RADIOGRAPHIC STUDIES: I have personally reviewed the radiological images as listed and agreed with the findings  in the report. No results found.   ASSESSMENT & PLAN:   63 y.o. very pleasant male with  1. R-ISS stage III multiple myeloma with extensive bone metastases and patholgiic fracture rt shoulder -Bone marrow biopsy and aspiration done 02/06/2022 revealed hypercellular bone marrow with plasma cell neoplasm. 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  Plan -Labs done today were reviewed in detail and adequate for treatment.  WBC 4.3, hemoglobin 14.3, platelet 163, creatinine and LFTs in range. - Most recent myeloma panel from 11/14/2023 shows M protein is not detectable. -No significant new toxicities from his current treatment regimen -Continue  carfilzomib/Revlimid/dexamethasone regimen -Continue Revlimid at 15 mg p.o. daily for 3 weeks on 1 week off with Eliquis for VTE prophylaxis -Continue monthly Zometa, next dose due today.  -Continue a Eliquis for VTE prophylaxis -Continue acyclovir for VZV prophylaxis  Follow up: RTC in 2 weeks with labs and Carfilzomib/Dex treatment.  RTC in 4 weeks for labs and follow up visit with Dr. Candise Che before Carfilzomib/Dex + Zometa treatment  All of the patient's questions were answered with apparent satisfaction. The patient knows to call the clinic with any problems, questions or concerns.  I have spent a total of 30 minutes minutes of face-to-face and non-face-to-face time, preparing to see the patient, performing a medically appropriate examination, counseling and educating the patient,documenting clinical information in the electronic health record,and care coordination.   Georga Kaufmann PA-C Dept of Hematology and Oncology [facility] Phone: [phone]

## 2023-12-01 ENCOUNTER — Other Ambulatory Visit: Payer: Self-pay

## 2023-12-01 DIAGNOSIS — C9000 Multiple myeloma not having achieved remission: Secondary | ICD-10-CM

## 2023-12-01 MED ORDER — METHOCARBAMOL 500 MG PO TABS: 500.0000 mg | ORAL_TABLET | Freq: Three times a day (TID) | ORAL | 0 refills | Status: AC | PRN

## 2023-12-02 ENCOUNTER — Other Ambulatory Visit: Payer: Self-pay | Admitting: Hematology

## 2023-12-02 DIAGNOSIS — C9000 Multiple myeloma not having achieved remission: Secondary | ICD-10-CM

## 2023-12-03 ENCOUNTER — Encounter: Payer: Self-pay | Admitting: Hematology

## 2023-12-05 ENCOUNTER — Other Ambulatory Visit: Payer: Self-pay

## 2023-12-15 ENCOUNTER — Inpatient Hospital Stay: Payer: BC Managed Care – PPO

## 2023-12-15 ENCOUNTER — Inpatient Hospital Stay

## 2023-12-15 VITALS — BP 147/90 | HR 60 | Temp 98.1°F | Resp 18 | Wt 189.8 lb

## 2023-12-15 DIAGNOSIS — C9000 Multiple myeloma not having achieved remission: Secondary | ICD-10-CM

## 2023-12-15 DIAGNOSIS — Z809 Family history of malignant neoplasm, unspecified: Secondary | ICD-10-CM | POA: Diagnosis not present

## 2023-12-15 DIAGNOSIS — M545 Low back pain, unspecified: Secondary | ICD-10-CM | POA: Diagnosis not present

## 2023-12-15 DIAGNOSIS — Z79891 Long term (current) use of opiate analgesic: Secondary | ICD-10-CM | POA: Diagnosis not present

## 2023-12-15 DIAGNOSIS — Z808 Family history of malignant neoplasm of other organs or systems: Secondary | ICD-10-CM | POA: Diagnosis not present

## 2023-12-15 DIAGNOSIS — Z7189 Other specified counseling: Secondary | ICD-10-CM

## 2023-12-15 DIAGNOSIS — Z7952 Long term (current) use of systemic steroids: Secondary | ICD-10-CM | POA: Diagnosis not present

## 2023-12-15 DIAGNOSIS — Z5112 Encounter for antineoplastic immunotherapy: Secondary | ICD-10-CM | POA: Diagnosis not present

## 2023-12-15 DIAGNOSIS — Z87891 Personal history of nicotine dependence: Secondary | ICD-10-CM | POA: Diagnosis not present

## 2023-12-15 LAB — CBC WITH DIFFERENTIAL (CANCER CENTER ONLY)
Abs Immature Granulocytes: 0.01 10*3/uL (ref 0.00–0.07)
Basophils Absolute: 0 10*3/uL (ref 0.0–0.1)
Basophils Relative: 1 %
Eosinophils Absolute: 0.1 10*3/uL (ref 0.0–0.5)
Eosinophils Relative: 3 %
HCT: 43.5 % (ref 39.0–52.0)
Hemoglobin: 14.6 g/dL (ref 13.0–17.0)
Immature Granulocytes: 0 %
Lymphocytes Relative: 25 %
Lymphs Abs: 0.7 10*3/uL (ref 0.7–4.0)
MCH: 27.9 pg (ref 26.0–34.0)
MCHC: 33.6 g/dL (ref 30.0–36.0)
MCV: 83.2 fL (ref 80.0–100.0)
Monocytes Absolute: 0.4 10*3/uL (ref 0.1–1.0)
Monocytes Relative: 14 %
Neutro Abs: 1.5 10*3/uL — ABNORMAL LOW (ref 1.7–7.7)
Neutrophils Relative %: 57 %
Platelet Count: 180 10*3/uL (ref 150–400)
RBC: 5.23 MIL/uL (ref 4.22–5.81)
RDW: 14 % (ref 11.5–15.5)
WBC Count: 2.7 10*3/uL — ABNORMAL LOW (ref 4.0–10.5)
nRBC: 0 % (ref 0.0–0.2)

## 2023-12-15 LAB — CMP (CANCER CENTER ONLY)
ALT: 9 U/L (ref 0–44)
AST: 11 U/L — ABNORMAL LOW (ref 15–41)
Albumin: 4.4 g/dL (ref 3.5–5.0)
Alkaline Phosphatase: 39 U/L (ref 38–126)
Anion gap: 7 (ref 5–15)
BUN: 14 mg/dL (ref 8–23)
CO2: 26 mmol/L (ref 22–32)
Calcium: 9.1 mg/dL (ref 8.9–10.3)
Chloride: 107 mmol/L (ref 98–111)
Creatinine: 0.9 mg/dL (ref 0.61–1.24)
GFR, Estimated: >60 mL/min (ref >60)
Glucose, Bld: 104 mg/dL — ABNORMAL HIGH (ref 70–99)
Potassium: 3.7 mmol/L (ref 3.5–5.1)
Sodium: 140 mmol/L (ref 135–145)
Total Bilirubin: 0.9 mg/dL (ref 0.0–1.2)
Total Protein: 6.2 g/dL — ABNORMAL LOW (ref 6.5–8.1)

## 2023-12-15 MED ORDER — ACETAMINOPHEN 325 MG PO TABS
650.0000 mg | ORAL_TABLET | Freq: Once | ORAL | Status: AC
Start: 2023-12-15 — End: 2023-12-15
  Administered 2023-12-15: 650 mg via ORAL
  Filled 2023-12-15: qty 2

## 2023-12-15 MED ORDER — ALTEPLASE 2 MG IJ SOLR: 2.0000 mg | Freq: Once | INTRAMUSCULAR | Status: DC | PRN

## 2023-12-15 MED ORDER — SODIUM CHLORIDE 0.9 % IV SOLN: Freq: Once | INTRAVENOUS | Status: AC

## 2023-12-15 MED ORDER — SODIUM CHLORIDE 0.9% FLUSH: 3.0000 mL | INTRAVENOUS | Status: DC | PRN

## 2023-12-15 MED ORDER — DEXAMETHASONE SODIUM PHOSPHATE 10 MG/ML IJ SOLN
8.0000 mg | Freq: Once | INTRAMUSCULAR | Status: AC
Start: 2023-12-15 — End: 2023-12-15
  Administered 2023-12-15: 8 mg via INTRAVENOUS
  Filled 2023-12-15: qty 1

## 2023-12-15 MED ORDER — PALONOSETRON HCL INJECTION 0.25 MG/5ML
0.2500 mg | Freq: Once | INTRAVENOUS | Status: AC
  Administered 2023-12-15: 0.25 mg via INTRAVENOUS
  Filled 2023-12-15: qty 5

## 2023-12-15 MED ORDER — DEXTROSE 5 % IV SOLN
70.0000 mg/m2 | Freq: Once | INTRAVENOUS | Status: AC
  Administered 2023-12-15: 140 mg via INTRAVENOUS
  Filled 2023-12-15: qty 60

## 2023-12-15 MED ORDER — HEPARIN SOD (PORK) LOCK FLUSH 100 UNIT/ML IV SOLN: 250.0000 [IU] | Freq: Once | INTRAVENOUS | Status: DC | PRN

## 2023-12-15 MED ORDER — SODIUM CHLORIDE 0.9% FLUSH: 10.0000 mL | INTRAVENOUS | Status: DC | PRN

## 2023-12-15 MED ORDER — SODIUM CHLORIDE 0.9 % IV SOLN
Freq: Once | INTRAVENOUS | Status: DC
Start: 2023-12-15 — End: 2023-12-15

## 2023-12-15 MED ORDER — HEPARIN SOD (PORK) LOCK FLUSH 100 UNIT/ML IV SOLN: 500.0000 [IU] | Freq: Once | INTRAVENOUS | Status: DC | PRN

## 2023-12-15 NOTE — Patient Instructions (Signed)
 [facility]  Discharge Instructions: Thank you for choosing [facility] to provide your oncology and hematology care.   If you have a lab appointment with the Cancer Center, please go directly to the Cancer Center and check in at the registration area.   Wear comfortable clothing and clothing appropriate for easy access to any Portacath or PICC line.   We strive to give you quality time with your provider. You may need to reschedule your appointment if you arrive late (15 or more minutes).  Arriving late affects you and other patients whose appointments are after yours.  Also, if you miss three or more appointments without notifying the office, you may be dismissed from the clinic at the provider's discretion.      For prescription refill requests, have your pharmacy contact our office and allow 72 hours for refills to be completed.    Today you received the following chemotherapy and/or immunotherapy agents: carfilzomib (KYPROLIS)      To help prevent nausea and vomiting after your treatment, we encourage you to take your nausea medication as directed.  BELOW ARE SYMPTOMS THAT SHOULD BE REPORTED IMMEDIATELY: *FEVER GREATER THAN 100.4 F (38 C) OR HIGHER *CHILLS OR SWEATING *NAUSEA AND VOMITING THAT IS NOT CONTROLLED WITH YOUR NAUSEA MEDICATION *UNUSUAL SHORTNESS OF BREATH *UNUSUAL BRUISING OR BLEEDING *URINARY PROBLEMS (pain or burning when urinating, or frequent urination) *BOWEL PROBLEMS (unusual diarrhea, constipation, pain near the anus) TENDERNESS IN MOUTH AND THROAT WITH OR WITHOUT PRESENCE OF ULCERS (sore throat, sores in mouth, or a toothache) UNUSUAL RASH, SWELLING OR PAIN  UNUSUAL VAGINAL DISCHARGE OR ITCHING   Items with * indicate a potential emergency and should be followed up as soon as possible or go to the Emergency Department if any problems should occur.  Please show the CHEMOTHERAPY ALERT CARD or  IMMUNOTHERAPY ALERT CARD at check-in to the Emergency Department and triage nurse.  Should you have questions after your visit or need to cancel or reschedule your appointment, please contact [facility]  Dept: [phone]  and follow the prompts.  Office hours are 8:00 a.m. to 4:30 p.m. Monday - Friday. Please note that voicemails left after 4:00 p.m. may not be returned until the following business day.  We are closed weekends and major holidays. You have access to a nurse at all times for urgent questions. Please call the main number to the clinic Dept: [phone] and follow the prompts.   For any non-urgent questions, you may also contact your provider using MyChart. We now offer e-Visits for anyone 62 and older to request care online for non-urgent symptoms. For details visit [email or web address].   Also download the MyChart app! Go to the app store, search "MyChart", open the app, select [facility], and log in with your MyChart username and password.

## 2023-12-15 NOTE — Progress Notes (Signed)
 Nutrition Follow-up:  Patient with multiple myeloma.  Receiving carfilozomab, revlimid .    Met with patient during infusion.  Reports that his appetite has improved.  Maybe has drank 1 ensure shake in the last month.  Breakfast is usually something sweet (english muffin with butter and jelly or banana, toast, butter) and coffee.  Lunch is leftovers.  Had chicken and rice yesterday and pizza recently.  Supper is usually meat, sometimes vegetables but not daily.  Has been eating ice cream, cheese, deli meats and crackers.  Does have nausea some vomiting after treatment for few days and then at times during treatment cycles.  Has nausea medication at home.     Medications: reviewed  Labs: reviewed  Anthropometrics:   Weight 186 lb 8 oz   189 lb on 2/27 185 lb 11.2 oz on 2/11 186 lb 12.8 oz on 1/28 185 lb 4 oz on 12/27    NUTRITION DIAGNOSIS: Unintentional weight loss improved    INTERVENTION:  Discussed options for adding protein source at breakfast Agree with holding off on oral nutrition supplement. Does not want to gain much weight back Encouraged more vegetables and fruit in diet  Contact information provided  MONITORING, EVALUATION, GOAL: weight trends, intake   NEXT VISIT: as needed  Kymberley Raz B. Zollie Hipp, CSO, LDN Registered Dietitian [phone]

## 2023-12-15 NOTE — Progress Notes (Signed)
 Pt ok for tx today per Dr Salomon Cree with [facility] 1.5

## 2023-12-17 ENCOUNTER — Telehealth: Payer: Self-pay

## 2023-12-17 ENCOUNTER — Inpatient Hospital Stay

## 2023-12-17 NOTE — Telephone Encounter (Signed)
 [facility] CSW Progress Note  Clinical Child psychotherapist  contacted patient for scheduled counseling appointment.  Patient reported continued positive affects and denied any immediate concerns. Patient will begin process of transferring care to [facility]. No additional clinical follow up is warranted. Patient is aware of services through [place]. NO SI/HI. Patient has direct contact if needed until transfer is completed.    Maudie Sorrow, LCSW Clinical Social Worker [facility]

## 2023-12-18 DIAGNOSIS — I10 Essential (primary) hypertension: Secondary | ICD-10-CM | POA: Diagnosis not present

## 2023-12-18 DIAGNOSIS — C9000 Multiple myeloma not having achieved remission: Secondary | ICD-10-CM | POA: Diagnosis not present

## 2023-12-18 DIAGNOSIS — E785 Hyperlipidemia, unspecified: Secondary | ICD-10-CM | POA: Diagnosis not present

## 2023-12-18 DIAGNOSIS — M549 Dorsalgia, unspecified: Secondary | ICD-10-CM | POA: Diagnosis not present

## 2023-12-18 DIAGNOSIS — N400 Enlarged prostate without lower urinary tract symptoms: Secondary | ICD-10-CM | POA: Diagnosis not present

## 2023-12-18 DIAGNOSIS — G8929 Other chronic pain: Secondary | ICD-10-CM | POA: Diagnosis not present

## 2023-12-22 LAB — MULTIPLE MYELOMA PANEL, SERUM
Albumin SerPl Elph-Mcnc: 3.6 g/dL (ref 2.9–4.4)
Albumin/Glob SerPl: 1.8 — ABNORMAL HIGH (ref 0.7–1.7)
Alpha 1: 0.2 g/dL (ref 0.0–0.4)
Alpha2 Glob SerPl Elph-Mcnc: 0.6 g/dL (ref 0.4–1.0)
B-Globulin SerPl Elph-Mcnc: 0.9 g/dL (ref 0.7–1.3)
Gamma Glob SerPl Elph-Mcnc: 0.5 g/dL (ref 0.4–1.8)
Globulin, Total: 2.1 g/dL — ABNORMAL LOW (ref 2.2–3.9)
IgA: 20 mg/dL — ABNORMAL LOW (ref 61–437)
IgG (Immunoglobin G), Serum: 495 mg/dL — ABNORMAL LOW (ref 603–1613)
IgM (Immunoglobulin M), Srm: 13 mg/dL — ABNORMAL LOW (ref 20–172)
Total Protein ELP: 5.7 g/dL — ABNORMAL LOW (ref 6.0–8.5)

## 2023-12-27 ENCOUNTER — Other Ambulatory Visit: Payer: Self-pay | Admitting: Hematology

## 2023-12-27 DIAGNOSIS — C9000 Multiple myeloma not having achieved remission: Secondary | ICD-10-CM

## 2023-12-28 NOTE — Progress Notes (Signed)
 HEMATOLOGY/ONCOLOGY CLINIC NOTE  Date of Service: 12/29/2023   Patient Care Team: Redmon, Noelle, [place] as PCP - General (Nurse Practitioner)  CHIEF COMPLAINTS/PURPOSE OF CONSULTATION:  Follow-up for continued evaluation and management of multiple myeloma  HISTORY OF PRESENTING ILLNESS:   Anthony [name] is a wonderful 63 y.o. male who has been referred to us  by Dr Diamond Formica, PA for evaluation and management of newly diagnosed multiple myeloma. He reports He is doing well.  He reports persistent lower back pain that he has had since he was in his 20's. He notes no previous significant injuries. He further notes that he gets chiropractic adjustments and takes Tylenol  to manage his symptoms. He notes less back pain when standing up on his right leg and maintaining weight on his right leg.  He reports previous history of smoking and chewing tobacco over 40 years ago.  He had a recent fall back in February of this year with only a pulled muscle. And he notes another fall when chasing his cat where he fell on his right shoulder. He reports pain in right shoulder.   He had a recent COVID-19 infection back in March.  He reports intermittent cough with SOB. He notes he takes 2 Rolaids at night. We discussed potentially trying antacids which he is agreeable to as he will begin steroids soon and was advised of the possible symptoms and side effects from taking steroids.  We discussed CRAB criteria and that he meets at least two of the criterion being  anemia and bone disease.  We discussed getting additional scans for further evaluation which he is agreeable to.  We further discussed starting Daratumumab /Velcade /Dexamethasone /Zometa  and Revlimid  for treatment which he is agreeable to. We also discussed starting steroids before treatment and taking an acid suppressant which he was also agreeable to.  We discussed getting Senna and taking it as needed for constipation.  Labs done today  were reviewed in detail.  We discussed his recent bone marrow biopsy and aspiration done 02/06/2022.  We discussed PET/CT scan done 01/28/2022.  We discussed CT right shoulder w/o contrast done 11/13/2021  INTERVAL HISTORY:  Anthony [name] is a 63 y.o. male, is here for continued evaluation of and management of multiple myeloma. Patient is here for toxicity check prior to receiving cycle 22 day 15 of his Carfilzomib /Revimid/Dexamethasone  treatment.  Patient was last seen by me on 10/29/2023 and complained of chronic back pain, fatigue, occasional anxiety issues, and lethargy in mornings.   He was seen by [facility] PA on 11/28/2023 and reported stabilized manageable back pain.  Patient notes that he is feeling well and has no acute new symptoms.  Pain is well-controlled and he is not needing much of his oxycodone  at this time.  No other acute treatment toxicities noted.  About some mild fatigue related to his treatments but would like to continue current treatment without additional dose reductions. Good p.o. intake. Plans to travel some this summer. Notes that he has not decided about whether he will transfer care to [facility] unless compelled to do so.   MEDICAL HISTORY:  Past Medical History:  Diagnosis Date   Back pain    Cancer (HCC)    Multiple myeloma (HCC) 02/15/2022    SURGICAL HISTORY: Past Surgical History:  Procedure Laterality Date   IR BONE TUMOR(S)RF ABLATION  05/07/2022   IR BONE TUMOR(S)RF ABLATION  05/07/2022   IR BONE TUMOR(S)RF ABLATION  05/07/2022   IR KYPHO EA ADDL LEVEL THORACIC OR  LUMBAR  05/07/2022   IR KYPHO LUMBAR INC FX REDUCE BONE BX UNI/BIL CANNULATION INC/IMAGING  05/07/2022   IR KYPHO THORACIC WITH BONE BIOPSY  05/07/2022   IR RADIOLOGIST EVAL & MGMT  04/23/2022   IR RADIOLOGIST EVAL & MGMT  05/17/2022   IR RADIOLOGIST EVAL & MGMT  06/06/2022    SOCIAL HISTORY: Social History   Socioeconomic History   Marital status: Married    Spouse name: Not on file    Number of children: Not on file   Years of education: Not on file   Highest education level: Not on file  Occupational History   Not on file  Tobacco Use   Smoking status: Never   Smokeless tobacco: Former    Types: Chew, Snuff  Vaping Use   Vaping status: Never Used  Substance and Sexual Activity   Alcohol use: Yes    Alcohol/week: 2.0 standard drinks of alcohol    Types: 2 Glasses of wine per week   Drug use: No   Sexual activity: Yes  Other Topics Concern   Not on file  Social History Narrative   Not on file   Social Drivers of Health   Financial Resource Strain: Low Risk  (05/07/2022)   Overall Financial Resource Strain (CARDIA)    Difficulty of Paying Living Expenses: Not very hard  Food Insecurity: No Food Insecurity (05/07/2022)   Hunger Vital Sign    Worried About Running Out of Food in the Last Year: Never true    Ran Out of Food in the Last Year: Never true  Transportation Needs: No Transportation Needs (05/07/2022)   PRAPARE - Administrator, Civil Service (Medical): No    Lack of Transportation (Non-Medical): No  Physical Activity: Not on file  Stress: Not on file  Social Connections: Not on file  Intimate Partner Violence: Unknown (05/07/2022)   Humiliation, Afraid, Rape, and Kick questionnaire    Fear of Current or Ex-Partner: No    Emotionally Abused: No    Physically Abused: No    Sexually Abused: Not on file    FAMILY HISTORY: Family History  Problem Relation Age of Onset   Rheum arthritis Mother    Other Sister        Pre-cancerous uterine mass;    Rheum arthritis Sister    Bone cancer Paternal Grandfather    Brain cancer Maternal Uncle    Brain cancer Maternal Aunt    Osteoporosis Neg Hx     ALLERGIES:  has no known allergies.  MEDICATIONS:  Current Outpatient Medications  Medication Sig Dispense Refill   acyclovir  (ZOVIRAX ) 400 MG tablet Take 1 tablet (400 mg total) by mouth 2 (two) times daily. 60 tablet 11   aluminum  chloride (DRYSOL) 20 % external solution Apply 1 application topically at bedtime.     apixaban  (ELIQUIS ) 5 MG TABS tablet Take 1 tablet (5 mg total) by mouth 2 (two) times daily. 60 tablet 5   calcium-vitamin D  (OSCAL WITH D) 250-125 MG-UNIT tablet Take 1 tablet by mouth daily.     cyanocobalamin  (VITAMIN B12) 1000 MCG tablet Take 1 tablet (1,000 mcg total) by mouth daily. 30 tablet 3   cyclobenzaprine (FLEXERIL) 10 MG tablet Take 10 mg by mouth 3 (three) times daily as needed for muscle spasms. (Patient not taking: Reported on 09/30/2023)     diclofenac sodium (VOLTAREN) 1 % GEL Apply 2 g topically as needed.     dronabinol  (MARINOL ) 10 MG capsule Take 1  capsule (10 mg total) by mouth 2 (two) times daily before a meal. (Patient not taking: Reported on 09/16/2023) 60 capsule 0   ergocalciferol  (VITAMIN D2) 1.25 MG (50000 UT) capsule Take 1 capsule (50,000 Units total) by mouth 2 (two) times a week. (Patient taking differently: Take 50,000 Units by mouth once a week.) 12 capsule 2   escitalopram (LEXAPRO) 10 MG tablet Take 10 mg by mouth daily. (Patient not taking: Reported on 11/28/2023)     esomeprazole  (NEXIUM ) 40 MG capsule Take 1 capsule (40 mg total) by mouth daily. Take 1 capsule (40 mg total) daily before breakfast 30 capsule 3   famotidine  (PEPCID ) 40 MG tablet Take 40 mg by mouth daily.     fentaNYL  (DURAGESIC ) 12 MCG/HR Place 1 patch onto the skin every 3 (three) days. 10 patch 0   lidocaine  (LIDODERM ) 5 % Place 1 patch onto the skin daily. Remove & Discard patch within 12 hours or as directed by MD 30 patch 0   loratadine (CLARITIN) 10 MG tablet Take 10 mg by mouth daily.     LORazepam  (ATIVAN ) 0.5 MG tablet Take 1 tablet (0.5 mg total) by mouth every 6 (six) hours as needed (Nausea or vomiting). 30 tablet 0   methocarbamol  (ROBAXIN ) 500 MG tablet Take 1 tablet (500 mg total) by mouth every 8 (eight) hours as needed for muscle spasms. 30 tablet 0   metroNIDAZOLE  (METROGEL ) 1 % gel Apply  topically daily. 45 g 0   Multiple Vitamin (MULTIVITAMIN) tablet Take 1 tablet by mouth daily.     Omega-3 Fatty Acids (FISH OIL) 1360 MG CAPS Take 1 capsule by mouth daily.     ondansetron  (ZOFRAN ) 8 MG tablet Take 1 tablet (8 mg total) by mouth every 8 (eight) hours as needed for nausea or vomiting. 30 tablet 0   oxyCODONE  (ROXICODONE ) 5 MG immediate release tablet Take 1 tablet (5 mg total) by mouth every 4 (four) hours as needed for severe pain (pain score 7-10) or moderate pain (pain score 4-6). Take 1 to 2 tablets every 4 hours as needed for moderate to severe pain. 60 tablet 0   prochlorperazine  (COMPAZINE ) 10 MG tablet TAKE ONE TABLET BY MOUTH EVERY 6 HOURS AS NEEDED FOR NAUSEA AND VOMITING 30 tablet 1   REVLIMID  15 MG capsule TAKE 1 CAPSULE DAILY FOR 21 DAYS ON, THEN 7 DAYS OFF 21 capsule 0   senna-docusate (SENNA S) 8.6-50 MG tablet Take 2 tablets by mouth at bedtime. 60 tablet 1   terbinafine (LAMISIL) 1 % cream Apply 1 application topically 2 (two) times daily.     No current facility-administered medications for this visit.   REVIEW OF SYSTEMS:  10 Point review of Systems was done is negative except as noted above.   PHYSICAL EXAMINATION: .BP (!) 143/85   Pulse (!) 58   Temp 97.9 F (36.6 C)   Resp 20   Wt 190 lb 1.6 oz (86.2 kg)   SpO2 100%   BMI 33.67 kg/m  GENERAL:alert, in no acute distress and comfortable SKIN: no acute rashes, no significant lesions EYES: conjunctiva are pink and non-injected, sclera anicteric OROPHARYNX: MMM, no exudates, no oropharyngeal erythema or ulceration NECK: supple, no JVD LYMPH:  no palpable lymphadenopathy in the cervical, axillary or inguinal regions LUNGS: clear to auscultation b/l with normal respiratory effort HEART: regular rate & rhythm ABDOMEN:  normoactive bowel sounds , non tender, not distended. Extremity: no pedal edema PSYCH: alert & oriented x 3 with fluent speech  NEURO: no focal motor/sensory deficits   LABORATORY  DATA:  I have reviewed the data as listed .    Latest Ref Rng & Units 12/29/2023   10:21 AM 12/15/2023    8:27 AM 11/28/2023    9:43 AM  CBC  WBC 4.0 - 10.5 K/uL 4.1  2.7  4.3   Hemoglobin 13.0 - 17.0 g/dL 16.1  09.6  04.5   Hematocrit 39.0 - 52.0 % 44.0  43.5  43.3   Platelets 150 - 400 K/uL 138  180  163       Latest Ref Rng & Units 12/29/2023   10:21 AM 12/15/2023    8:27 AM 11/28/2023    9:43 AM  CMP  Glucose 70 - 99 mg/dL 79  409  811   BUN 8 - 23 mg/dL 12  14  10    Creatinine 0.61 - 1.24 mg/dL 9.14  7.82  9.56   Sodium 135 - 145 mmol/L 140  140  137   Potassium 3.5 - 5.1 mmol/L 3.7  3.7  3.9   Chloride 98 - 111 mmol/L 107  107  106   CO2 22 - 32 mmol/L 26  26  25    Calcium 8.9 - 10.3 mg/dL 8.5  9.1  9.0   Total Protein 6.5 - 8.1 g/dL 6.2  6.2  6.3   Total Bilirubin 0.0 - 1.2 mg/dL 0.8  0.9  1.0   Alkaline Phos 38 - 126 U/L 43  39  41   AST 15 - 41 U/L 11  11  11    ALT 0 - 44 U/L 8  9  9       PATHOLOGY Surgical Pathology CASE: [id number] PATIENT: Bejamin Robinette Bone Marrow Report     Clinical History: Multiple Myeloma     DIAGNOSIS:  BONE MARROW, ASPIRATE, CLOT, CORE: -Hypercellular bone marrow (60%) with erythroid predominant trilineage hematopoiesis and no evidence of residual plasma cell neoplasm (less than 1% plasma cells by manual aspirate differential, and CD138 immunohistochemical analysis of clot and core sections).  PERIPHERAL BLOOD: -Leukopenia  MICROSCOPIC DESCRIPTION:  PERIPHERAL BLOOD SMEAR: Platelets: Adequate in number, no platelet clumps identified Erythroid: Normocytic normochromic red blood cells Leukocytes: Mild leukopenia, negative for dysplastic granulocytes, blasts or  plasma cells  BONE MARROW ASPIRATE: Cellular Erythroid precursors: Relatively increased, show a full sequence of maturation with occasional abnormal forms (including binucleate forms, megaloblastoid change and nuclear membrane irregularities) Granulocytic  precursors: Decreased, show a full sequence of generally orderly maturation Megakaryocytes: Normal in number and morphology Lymphocytes/plasma cells: Not increased   02/06/2022 Molecular pathology   RADIOGRAPHIC STUDIES: I have personally reviewed the radiological images as listed and agreed with the findings in the report. No results found.   ASSESSMENT & PLAN:   63 y.o. very pleasant male with  1. R-ISS stage III multiple myeloma with extensive bone metastases and patholgiic fracture rt shoulder -Recent bone marrow biopsy and aspiration done 02/06/2022 revealed hypercellular bone marrow with plasma cell neoplasm. Cytogenetics-normal karyotype Molecular cytogenetics-TP53 deletion and duplication of 1 q.Aaron Aas  PLAN:  -Discussed lab results from today, 12/29/2023, in detail with patient.  -Multiple myeloma panel results from 12/15/2023 shows that his M-protein continues to be undetectable.  -Notes some grade 1 fatigue, otherwise no other acute new toxicities from his current maintenance treatment regimen with carfilzomib  and Revlimid . - Continue carfilzomib  maintenance every 2 weeks and maintenance dose of Revlimid . Continue current pain management regimen -continue 1,000 mcg daily vitamin B12 . -continue B complex supplements. -  continue v ergocalciferol  50 k units once a week. -answered all of patient's questions in detail.  FOLLOW-UP: Continue carfilzomib  per integrated scheduling every 2 weeks with port flush and labs Return to clinic with Dr. Salomon Cree in 6 weeks  The total time spent in the appointment was 30 minutes* .  All of the patient's questions were answered with apparent satisfaction. The patient knows to call the clinic with any problems, questions or concerns.   Jacquelyn Matt MD MS AAHIVMS [facility] Hematology/Oncology Physician [facility]  .*Total Encounter Time as defined by the Centers for Medicare and Medicaid Services includes, in addition to the  face-to-face time of a patient visit (documented in the note above) non-face-to-face time: obtaining and reviewing outside history, ordering and reviewing medications, tests or procedures, care coordination (communications with other health care professionals or caregivers) and documentation in the medical record.    I,Mitra Faeizi,acting as a Neurosurgeon for Jacquelyn Matt, MD.,have documented all relevant documentation on the behalf of Jacquelyn Matt, MD,as directed by  Jacquelyn Matt, MD while in the presence of Jacquelyn Matt, MD.  .I have reviewed the above documentation for accuracy and completeness, and I agree with the above. .Rashi Granier Kishore Thaison Kolodziejski MD

## 2023-12-29 ENCOUNTER — Inpatient Hospital Stay

## 2023-12-29 ENCOUNTER — Encounter: Payer: Self-pay | Admitting: Hematology

## 2023-12-29 ENCOUNTER — Other Ambulatory Visit: Payer: Self-pay | Admitting: *Deleted

## 2023-12-29 ENCOUNTER — Inpatient Hospital Stay: Attending: Physician Assistant

## 2023-12-29 ENCOUNTER — Inpatient Hospital Stay (HOSPITAL_BASED_OUTPATIENT_CLINIC_OR_DEPARTMENT_OTHER): Admitting: Hematology

## 2023-12-29 VITALS — BP 143/85 | HR 58 | Temp 97.9°F | Resp 20 | Wt 190.1 lb

## 2023-12-29 DIAGNOSIS — Z7952 Long term (current) use of systemic steroids: Secondary | ICD-10-CM | POA: Insufficient documentation

## 2023-12-29 DIAGNOSIS — M545 Low back pain, unspecified: Secondary | ICD-10-CM | POA: Diagnosis not present

## 2023-12-29 DIAGNOSIS — Z5112 Encounter for antineoplastic immunotherapy: Secondary | ICD-10-CM | POA: Insufficient documentation

## 2023-12-29 DIAGNOSIS — Z79891 Long term (current) use of opiate analgesic: Secondary | ICD-10-CM | POA: Diagnosis not present

## 2023-12-29 DIAGNOSIS — Z808 Family history of malignant neoplasm of other organs or systems: Secondary | ICD-10-CM | POA: Diagnosis not present

## 2023-12-29 DIAGNOSIS — M25511 Pain in right shoulder: Secondary | ICD-10-CM | POA: Insufficient documentation

## 2023-12-29 DIAGNOSIS — R0602 Shortness of breath: Secondary | ICD-10-CM | POA: Insufficient documentation

## 2023-12-29 DIAGNOSIS — Z7189 Other specified counseling: Secondary | ICD-10-CM

## 2023-12-29 DIAGNOSIS — Z5111 Encounter for antineoplastic chemotherapy: Secondary | ICD-10-CM

## 2023-12-29 DIAGNOSIS — C9000 Multiple myeloma not having achieved remission: Secondary | ICD-10-CM

## 2023-12-29 DIAGNOSIS — R059 Cough, unspecified: Secondary | ICD-10-CM | POA: Insufficient documentation

## 2023-12-29 DIAGNOSIS — Z87891 Personal history of nicotine dependence: Secondary | ICD-10-CM | POA: Insufficient documentation

## 2023-12-29 DIAGNOSIS — R5383 Other fatigue: Secondary | ICD-10-CM | POA: Insufficient documentation

## 2023-12-29 DIAGNOSIS — Z809 Family history of malignant neoplasm, unspecified: Secondary | ICD-10-CM | POA: Insufficient documentation

## 2023-12-29 LAB — CBC WITH DIFFERENTIAL (CANCER CENTER ONLY)
Abs Immature Granulocytes: 0.01 10*3/uL (ref 0.00–0.07)
Basophils Absolute: 0 10*3/uL (ref 0.0–0.1)
Basophils Relative: 1 %
Eosinophils Absolute: 0.2 10*3/uL (ref 0.0–0.5)
Eosinophils Relative: 5 %
HCT: 44 % (ref 39.0–52.0)
Hemoglobin: 14.6 g/dL (ref 13.0–17.0)
Immature Granulocytes: 0 %
Lymphocytes Relative: 27 %
Lymphs Abs: 1.1 10*3/uL (ref 0.7–4.0)
MCH: 27.9 pg (ref 26.0–34.0)
MCHC: 33.2 g/dL (ref 30.0–36.0)
MCV: 84 fL (ref 80.0–100.0)
Monocytes Absolute: 0.6 10*3/uL (ref 0.1–1.0)
Monocytes Relative: 14 %
Neutro Abs: 2.2 10*3/uL (ref 1.7–7.7)
Neutrophils Relative %: 53 %
Platelet Count: 138 10*3/uL — ABNORMAL LOW (ref 150–400)
RBC: 5.24 MIL/uL (ref 4.22–5.81)
RDW: 14.4 % (ref 11.5–15.5)
WBC Count: 4.1 10*3/uL (ref 4.0–10.5)
nRBC: 0 % (ref 0.0–0.2)

## 2023-12-29 LAB — CMP (CANCER CENTER ONLY)
ALT: 8 U/L (ref 0–44)
AST: 11 U/L — ABNORMAL LOW (ref 15–41)
Albumin: 4.3 g/dL (ref 3.5–5.0)
Alkaline Phosphatase: 43 U/L (ref 38–126)
Anion gap: 7 (ref 5–15)
BUN: 12 mg/dL (ref 8–23)
CO2: 26 mmol/L (ref 22–32)
Calcium: 8.5 mg/dL — ABNORMAL LOW (ref 8.9–10.3)
Chloride: 107 mmol/L (ref 98–111)
Creatinine: 0.82 mg/dL (ref 0.61–1.24)
GFR, Estimated: >60 mL/min (ref >60)
Glucose, Bld: 79 mg/dL (ref 70–99)
Potassium: 3.7 mmol/L (ref 3.5–5.1)
Sodium: 140 mmol/L (ref 135–145)
Total Bilirubin: 0.8 mg/dL (ref 0.0–1.2)
Total Protein: 6.2 g/dL — ABNORMAL LOW (ref 6.5–8.1)

## 2023-12-29 MED ORDER — ZOLEDRONIC ACID 4 MG/100ML IV SOLN
4.0000 mg | Freq: Once | INTRAVENOUS | Status: AC
Start: 2023-12-29 — End: 2023-12-29
  Administered 2023-12-29: 4 mg via INTRAVENOUS
  Filled 2023-12-29: qty 100

## 2023-12-29 MED ORDER — DEXTROSE 5 % IV SOLN
70.0000 mg/m2 | Freq: Once | INTRAVENOUS | Status: AC
  Administered 2023-12-29: 140 mg via INTRAVENOUS
  Filled 2023-12-29: qty 60

## 2023-12-29 MED ORDER — SODIUM CHLORIDE 0.9 % IV SOLN
Freq: Once | INTRAVENOUS | Status: AC
Start: 2023-12-29 — End: 2023-12-29

## 2023-12-29 MED ORDER — PALONOSETRON HCL INJECTION 0.25 MG/5ML
0.2500 mg | Freq: Once | INTRAVENOUS | Status: AC
Start: 2023-12-29 — End: 2023-12-29
  Administered 2023-12-29: 0.25 mg via INTRAVENOUS
  Filled 2023-12-29: qty 5

## 2023-12-29 MED ORDER — DEXAMETHASONE SODIUM PHOSPHATE 10 MG/ML IJ SOLN
8.0000 mg | Freq: Once | INTRAMUSCULAR | Status: AC
  Administered 2023-12-29: 8 mg via INTRAVENOUS
  Filled 2023-12-29: qty 1

## 2023-12-29 MED ORDER — FENTANYL 12 MCG/HR TD PT72: 1.0000 | MEDICATED_PATCH | TRANSDERMAL | 0 refills | Status: DC

## 2023-12-29 NOTE — Patient Instructions (Signed)
 [facility] - A DEPT OF [facility]  Discharge Instructions: Thank you for choosing [facility] to provide your oncology and hematology care.   If you have a lab appointment with the Cancer Center, please go directly to the Cancer Center and check in at the registration area.   Wear comfortable clothing and clothing appropriate for easy access to any Portacath or PICC line.   We strive to give you quality time with your provider. You may need to reschedule your appointment if you arrive late (15 or more minutes).  Arriving late affects you and other patients whose appointments are after yours.  Also, if you miss three or more appointments without notifying the office, you may be dismissed from the clinic at the provider's discretion.      For prescription refill requests, have your pharmacy contact our office and allow 72 hours for refills to be completed.    Today you received the following chemotherapy and/or immunotherapy agents :  Carfilzomib      To help prevent nausea and vomiting after your treatment, we encourage you to take your nausea medication as directed.  BELOW ARE SYMPTOMS THAT SHOULD BE REPORTED IMMEDIATELY: *FEVER GREATER THAN 100.4 F (38 C) OR HIGHER *CHILLS OR SWEATING *NAUSEA AND VOMITING THAT IS NOT CONTROLLED WITH YOUR NAUSEA MEDICATION *UNUSUAL SHORTNESS OF BREATH *UNUSUAL BRUISING OR BLEEDING *URINARY PROBLEMS (pain or burning when urinating, or frequent urination) *BOWEL PROBLEMS (unusual diarrhea, constipation, pain near the anus) TENDERNESS IN MOUTH AND THROAT WITH OR WITHOUT PRESENCE OF ULCERS (sore throat, sores in mouth, or a toothache) UNUSUAL RASH, SWELLING OR PAIN  UNUSUAL VAGINAL DISCHARGE OR ITCHING   Items with * indicate a potential emergency and should be followed up as soon as possible or go to the Emergency Department if any problems should occur.  Please show the CHEMOTHERAPY ALERT CARD or  IMMUNOTHERAPY ALERT CARD at check-in to the Emergency Department and triage nurse.  Should you have questions after your visit or need to cancel or reschedule your appointment, please contact [facility]  Dept: [phone]  and follow the prompts.  Office hours are 8:00 a.m. to 4:30 p.m. Monday - Friday. Please note that voicemails left after 4:00 p.m. may not be returned until the following business day.  We are closed weekends and major holidays. You have access to a nurse at all times for urgent questions. Please call the main number to the clinic Dept: [phone] and follow the prompts.   For any non-urgent questions, you may also contact your provider using MyChart. We now offer e-Visits for anyone 66 and older to request care online for non-urgent symptoms. For details visit [email or web address].   Also download the MyChart app! Go to the app store, search "MyChart", open the app, select [facility], and log in with your MyChart username and password.  Zoledronic Acid Injection (Cancer) What is this medication? ZOLEDRONIC ACID (ZOE le dron ik AS id) treats high calcium levels in the blood caused by cancer. It may also be used with chemotherapy to treat weakened bones caused by cancer. It works by slowing down the release of calcium from bones. This lowers calcium levels in your blood. It also makes your bones stronger and less likely to break (fracture). It belongs to a group of medications called bisphosphonates. This medicine may be used for other purposes; ask your health  care provider or pharmacist if you have questions. COMMON BRAND NAME(S): Zometa, Zometa Powder What should I tell my care team before I take this medication? They need to know if you have any of these conditions: Dehydration Dental disease Kidney disease Liver disease Low levels of calcium in the blood Lung or breathing disease, such as asthma Receiving  steroids, such as dexamethasone or prednisone An unusual or allergic reaction to zoledronic acid, other medications, foods, dyes, or preservatives Pregnant or trying to get pregnant Breast-feeding How should I use this medication? This medication is injected into a vein. It is given by your care team in a hospital or clinic setting. Talk to your care team about the use of this medication in children. Special care may be needed. Overdosage: If you think you have taken too much of this medicine contact a poison control center or emergency room at once. NOTE: This medicine is only for you. Do not share this medicine with others. What if I miss a dose? Keep appointments for follow-up doses. It is important not to miss your dose. Call your care team if you are unable to keep an appointment. What may interact with this medication? Certain antibiotics given by injection Diuretics, such as bumetanide, furosemide NSAIDs, medications for pain and inflammation, such as ibuprofen or naproxen Teriparatide Thalidomide This list may not describe all possible interactions. Give your health care provider a list of all the medicines, herbs, non-prescription drugs, or dietary supplements you use. Also tell them if you smoke, drink alcohol, or use illegal drugs. Some items may interact with your medicine. What should I watch for while using this medication? Visit your care team for regular checks on your progress. It may be some time before you see the benefit from this medication. Some people who take this medication have severe bone, joint, or muscle pain. This medication may also increase your risk for jaw problems or a broken thigh bone. Tell your care team right away if you have severe pain in your jaw, bones, joints, or muscles. Tell you care team if you have any pain that does not go away or that gets worse. Tell your dentist and dental surgeon that you are taking this medication. You should not have major  dental surgery while on this medication. See your dentist to have a dental exam and fix any dental problems before starting this medication. Take good care of your teeth while on this medication. Make sure you see your dentist for regular follow-up appointments. You should make sure you get enough calcium and vitamin D while you are taking this medication. Discuss the foods you eat and the vitamins you take with your care team. Check with your care team if you have severe diarrhea, nausea, and vomiting, or if you sweat a lot. The loss of too much body fluid may make it dangerous for you to take this medication. You may need bloodwork while taking this medication. Talk to your care team if you wish to become pregnant or think you might be pregnant. This medication can cause serious birth defects. What side effects may I notice from receiving this medication? Side effects that you should report to your care team as soon as possible: Allergic reactions--skin rash, itching, hives, swelling of the face, lips, tongue, or throat Kidney injury--decrease in the amount of urine, swelling of the ankles, hands, or feet Low calcium level--muscle pain or cramps, confusion, tingling, or numbness in the hands or feet Osteonecrosis of the jaw--pain, swelling,  or redness in the mouth, numbness of the jaw, poor healing after dental work, unusual discharge from the mouth, visible bones in the mouth Severe bone, joint, or muscle pain Side effects that usually do not require medical attention (report to your care team if they continue or are bothersome): Constipation Fatigue Fever Loss of appetite Nausea Stomach pain This list may not describe all possible side effects. Call your doctor for medical advice about side effects. You may report side effects to FDA at 1-800-FDA-1088. Where should I keep my medication? This medication is given in a hospital or clinic. It will not be stored at home. NOTE: This sheet is a  summary. It may not cover all possible information. If you have questions about this medicine, talk to your doctor, pharmacist, or health care provider.  2024 Elsevier/Gold Standard (2021-09-28 00:00:00)

## 2023-12-30 ENCOUNTER — Encounter: Payer: Self-pay | Admitting: Hematology

## 2023-12-30 ENCOUNTER — Other Ambulatory Visit (HOSPITAL_COMMUNITY): Payer: Self-pay

## 2023-12-30 ENCOUNTER — Other Ambulatory Visit: Payer: Self-pay | Admitting: *Deleted

## 2023-12-30 ENCOUNTER — Other Ambulatory Visit (HOSPITAL_BASED_OUTPATIENT_CLINIC_OR_DEPARTMENT_OTHER): Payer: Self-pay

## 2023-12-30 ENCOUNTER — Other Ambulatory Visit: Payer: Self-pay

## 2023-12-30 DIAGNOSIS — C9000 Multiple myeloma not having achieved remission: Secondary | ICD-10-CM

## 2023-12-30 MED ORDER — REVLIMID 15 MG PO CAPS
ORAL_CAPSULE | ORAL | 0 refills | Status: DC
  Filled 2023-12-30: qty 21, fill #0

## 2023-12-30 MED ORDER — FENTANYL 12 MCG/HR TD PT72
1.0000 | MEDICATED_PATCH | TRANSDERMAL | 0 refills | Status: DC
  Filled 2023-12-30: qty 10, 30d supply, fill #0

## 2023-12-30 MED ORDER — REVLIMID 15 MG PO CAPS: ORAL_CAPSULE | ORAL | 0 refills | Status: DC

## 2024-01-03 ENCOUNTER — Other Ambulatory Visit: Payer: Self-pay

## 2024-01-04 ENCOUNTER — Encounter: Payer: Self-pay | Admitting: Hematology

## 2024-01-13 NOTE — Addendum Note (Signed)
 Addended by: Stiven Kaspar M on: 01/13/2024 09:11 AM   Modules accepted: Orders

## 2024-01-14 ENCOUNTER — Inpatient Hospital Stay

## 2024-01-14 ENCOUNTER — Inpatient Hospital Stay: Admitting: Dietician

## 2024-01-14 VITALS — BP 142/81 | HR 76 | Temp 97.8°F | Resp 18 | Wt 187.8 lb

## 2024-01-14 DIAGNOSIS — R059 Cough, unspecified: Secondary | ICD-10-CM | POA: Diagnosis not present

## 2024-01-14 DIAGNOSIS — Z87891 Personal history of nicotine dependence: Secondary | ICD-10-CM | POA: Diagnosis not present

## 2024-01-14 DIAGNOSIS — Z809 Family history of malignant neoplasm, unspecified: Secondary | ICD-10-CM | POA: Diagnosis not present

## 2024-01-14 DIAGNOSIS — C9000 Multiple myeloma not having achieved remission: Secondary | ICD-10-CM

## 2024-01-14 DIAGNOSIS — Z79891 Long term (current) use of opiate analgesic: Secondary | ICD-10-CM | POA: Diagnosis not present

## 2024-01-14 DIAGNOSIS — M545 Low back pain, unspecified: Secondary | ICD-10-CM | POA: Diagnosis not present

## 2024-01-14 DIAGNOSIS — R5383 Other fatigue: Secondary | ICD-10-CM | POA: Diagnosis not present

## 2024-01-14 DIAGNOSIS — R0602 Shortness of breath: Secondary | ICD-10-CM | POA: Diagnosis not present

## 2024-01-14 DIAGNOSIS — Z7189 Other specified counseling: Secondary | ICD-10-CM

## 2024-01-14 DIAGNOSIS — M25511 Pain in right shoulder: Secondary | ICD-10-CM | POA: Diagnosis not present

## 2024-01-14 DIAGNOSIS — Z7952 Long term (current) use of systemic steroids: Secondary | ICD-10-CM | POA: Diagnosis not present

## 2024-01-14 DIAGNOSIS — Z5112 Encounter for antineoplastic immunotherapy: Secondary | ICD-10-CM | POA: Diagnosis not present

## 2024-01-14 DIAGNOSIS — Z808 Family history of malignant neoplasm of other organs or systems: Secondary | ICD-10-CM | POA: Diagnosis not present

## 2024-01-14 LAB — CBC WITH DIFFERENTIAL (CANCER CENTER ONLY)
Abs Immature Granulocytes: 0.03 10*3/uL (ref 0.00–0.07)
Basophils Absolute: 0 10*3/uL (ref 0.0–0.1)
Basophils Relative: 1 %
Eosinophils Absolute: 0.1 10*3/uL (ref 0.0–0.5)
Eosinophils Relative: 3 %
HCT: 44.6 % (ref 39.0–52.0)
Hemoglobin: 15.1 g/dL (ref 13.0–17.0)
Immature Granulocytes: 1 %
Lymphocytes Relative: 26 %
Lymphs Abs: 1 10*3/uL (ref 0.7–4.0)
MCH: 27.8 pg (ref 26.0–34.0)
MCHC: 33.9 g/dL (ref 30.0–36.0)
MCV: 82.1 fL (ref 80.0–100.0)
Monocytes Absolute: 0.3 10*3/uL (ref 0.1–1.0)
Monocytes Relative: 9 %
Neutro Abs: 2.3 10*3/uL (ref 1.7–7.7)
Neutrophils Relative %: 60 %
Platelet Count: 222 10*3/uL (ref 150–400)
RBC: 5.43 MIL/uL (ref 4.22–5.81)
RDW: 14.4 % (ref 11.5–15.5)
WBC Count: 3.7 10*3/uL — ABNORMAL LOW (ref 4.0–10.5)
nRBC: 0 % (ref 0.0–0.2)

## 2024-01-14 LAB — CMP (CANCER CENTER ONLY)
ALT: 13 U/L (ref 0–44)
AST: 13 U/L — ABNORMAL LOW (ref 15–41)
Albumin: 4.5 g/dL (ref 3.5–5.0)
Alkaline Phosphatase: 46 U/L (ref 38–126)
Anion gap: 8 (ref 5–15)
BUN: 17 mg/dL (ref 8–23)
CO2: 24 mmol/L (ref 22–32)
Calcium: 9.5 mg/dL (ref 8.9–10.3)
Chloride: 105 mmol/L (ref 98–111)
Creatinine: 0.91 mg/dL (ref 0.61–1.24)
GFR, Estimated: >60 mL/min (ref >60)
Glucose, Bld: 90 mg/dL (ref 70–99)
Potassium: 4.1 mmol/L (ref 3.5–5.1)
Sodium: 137 mmol/L (ref 135–145)
Total Bilirubin: 0.8 mg/dL (ref 0.0–1.2)
Total Protein: 6.6 g/dL (ref 6.5–8.1)

## 2024-01-14 MED ORDER — DEXAMETHASONE SODIUM PHOSPHATE 10 MG/ML IJ SOLN
8.0000 mg | Freq: Once | INTRAMUSCULAR | Status: AC
  Administered 2024-01-14: 8 mg via INTRAVENOUS
  Filled 2024-01-14: qty 1

## 2024-01-14 MED ORDER — SODIUM CHLORIDE 0.9 % IV SOLN: Freq: Once | INTRAVENOUS | Status: AC

## 2024-01-14 MED ORDER — HEPARIN SOD (PORK) LOCK FLUSH 100 UNIT/ML IV SOLN
500.0000 [IU] | Freq: Once | INTRAVENOUS | Status: DC | PRN
Start: 2024-01-14 — End: 2024-01-14

## 2024-01-14 MED ORDER — DEXTROSE 5 % IV SOLN
70.0000 mg/m2 | Freq: Once | INTRAVENOUS | Status: AC
  Administered 2024-01-14: 140 mg via INTRAVENOUS
  Filled 2024-01-14: qty 60

## 2024-01-14 MED ORDER — ACETAMINOPHEN 325 MG PO TABS
650.0000 mg | ORAL_TABLET | Freq: Once | ORAL | Status: AC
  Administered 2024-01-14: 650 mg via ORAL
  Filled 2024-01-14: qty 2

## 2024-01-14 MED ORDER — SODIUM CHLORIDE 0.9% FLUSH: 10.0000 mL | INTRAVENOUS | Status: DC | PRN

## 2024-01-14 MED ORDER — PALONOSETRON HCL INJECTION 0.25 MG/5ML
0.2500 mg | Freq: Once | INTRAVENOUS | Status: AC
  Administered 2024-01-14: 0.25 mg via INTRAVENOUS
  Filled 2024-01-14: qty 5

## 2024-01-14 NOTE — Progress Notes (Signed)
 Nutrition Follow-up:  Patient with multiple myeloma. He is receiving kyprolis  + darzalex  q28d (start 04/07/22)   Met with patient in infusion. He reports doing well. Appetite has been good. Patient is focusing on protein and hydration. He is seeking ideas that are high in protein, simple ingredients, and can be made in larger batches. Patient has not been consuming protein shakes recently. He denies nausea, vomiting, diarrhea, constipation.    Medications: reviewed   Labs: reviewed   Anthropometrics: Wt 187 lb 12.8 oz today   5/12 - 190 lb 1.6 oz 4/28 - 189 lb 12 oz 3/28 - 187 lb 8 oz 2/27 - 189 lb    NUTRITION DIAGNOSIS: Unintended wt loss - stable     INTERVENTION:  Continue strategies for increasing protein Strive for wt maintenance  Provided cook book for ideas    MONITORING, EVALUATION, GOAL: wt trends, intake   NEXT VISIT: To be scheduled as needed

## 2024-01-14 NOTE — Patient Instructions (Signed)

## 2024-01-16 LAB — MULTIPLE MYELOMA PANEL, SERUM
Albumin SerPl Elph-Mcnc: 4 g/dL (ref 2.9–4.4)
Albumin/Glob SerPl: 1.7 (ref 0.7–1.7)
Alpha 1: 0.3 g/dL (ref 0.0–0.4)
Alpha2 Glob SerPl Elph-Mcnc: 0.6 g/dL (ref 0.4–1.0)
B-Globulin SerPl Elph-Mcnc: 1 g/dL (ref 0.7–1.3)
Gamma Glob SerPl Elph-Mcnc: 0.6 g/dL (ref 0.4–1.8)
Globulin, Total: 2.5 g/dL (ref 2.2–3.9)
IgA: 28 mg/dL — ABNORMAL LOW (ref 61–437)
IgG (Immunoglobin G), Serum: 582 mg/dL — ABNORMAL LOW (ref 603–1613)
IgM (Immunoglobulin M), Srm: 13 mg/dL — ABNORMAL LOW (ref 20–172)
Total Protein ELP: 6.5 g/dL (ref 6.0–8.5)

## 2024-01-22 ENCOUNTER — Other Ambulatory Visit: Payer: Self-pay

## 2024-01-23 ENCOUNTER — Other Ambulatory Visit: Payer: Self-pay

## 2024-01-23 DIAGNOSIS — C9000 Multiple myeloma not having achieved remission: Secondary | ICD-10-CM

## 2024-01-25 ENCOUNTER — Other Ambulatory Visit: Payer: Self-pay

## 2024-01-26 ENCOUNTER — Other Ambulatory Visit: Payer: Self-pay

## 2024-01-26 DIAGNOSIS — C9000 Multiple myeloma not having achieved remission: Secondary | ICD-10-CM

## 2024-01-26 MED ORDER — REVLIMID 15 MG PO CAPS: ORAL_CAPSULE | ORAL | 0 refills | Status: DC

## 2024-01-27 ENCOUNTER — Encounter: Payer: Self-pay | Admitting: Hematology

## 2024-01-27 ENCOUNTER — Inpatient Hospital Stay

## 2024-01-27 ENCOUNTER — Other Ambulatory Visit (HOSPITAL_BASED_OUTPATIENT_CLINIC_OR_DEPARTMENT_OTHER): Payer: Self-pay

## 2024-01-27 ENCOUNTER — Inpatient Hospital Stay: Attending: Physician Assistant

## 2024-01-27 ENCOUNTER — Inpatient Hospital Stay (HOSPITAL_BASED_OUTPATIENT_CLINIC_OR_DEPARTMENT_OTHER): Admitting: Hematology

## 2024-01-27 VITALS — BP 126/77 | HR 68 | Temp 97.7°F | Resp 20 | Wt 186.3 lb

## 2024-01-27 DIAGNOSIS — R350 Frequency of micturition: Secondary | ICD-10-CM | POA: Insufficient documentation

## 2024-01-27 DIAGNOSIS — Z5111 Encounter for antineoplastic chemotherapy: Secondary | ICD-10-CM

## 2024-01-27 DIAGNOSIS — C9000 Multiple myeloma not having achieved remission: Secondary | ICD-10-CM | POA: Diagnosis not present

## 2024-01-27 DIAGNOSIS — Z809 Family history of malignant neoplasm, unspecified: Secondary | ICD-10-CM | POA: Diagnosis not present

## 2024-01-27 DIAGNOSIS — R0602 Shortness of breath: Secondary | ICD-10-CM | POA: Diagnosis not present

## 2024-01-27 DIAGNOSIS — Z7189 Other specified counseling: Secondary | ICD-10-CM

## 2024-01-27 DIAGNOSIS — Z808 Family history of malignant neoplasm of other organs or systems: Secondary | ICD-10-CM | POA: Insufficient documentation

## 2024-01-27 DIAGNOSIS — Z87891 Personal history of nicotine dependence: Secondary | ICD-10-CM | POA: Diagnosis not present

## 2024-01-27 DIAGNOSIS — G893 Neoplasm related pain (acute) (chronic): Secondary | ICD-10-CM | POA: Diagnosis not present

## 2024-01-27 DIAGNOSIS — R5383 Other fatigue: Secondary | ICD-10-CM | POA: Insufficient documentation

## 2024-01-27 DIAGNOSIS — M545 Low back pain, unspecified: Secondary | ICD-10-CM | POA: Insufficient documentation

## 2024-01-27 DIAGNOSIS — Z79899 Other long term (current) drug therapy: Secondary | ICD-10-CM | POA: Insufficient documentation

## 2024-01-27 DIAGNOSIS — M25511 Pain in right shoulder: Secondary | ICD-10-CM | POA: Insufficient documentation

## 2024-01-27 DIAGNOSIS — Z7952 Long term (current) use of systemic steroids: Secondary | ICD-10-CM | POA: Insufficient documentation

## 2024-01-27 DIAGNOSIS — Z5112 Encounter for antineoplastic immunotherapy: Secondary | ICD-10-CM | POA: Diagnosis not present

## 2024-01-27 DIAGNOSIS — R059 Cough, unspecified: Secondary | ICD-10-CM | POA: Insufficient documentation

## 2024-01-27 LAB — CMP (CANCER CENTER ONLY)
ALT: 15 U/L (ref 0–44)
AST: 15 U/L (ref 15–41)
Albumin: 4.4 g/dL (ref 3.5–5.0)
Alkaline Phosphatase: 45 U/L (ref 38–126)
Anion gap: 6 (ref 5–15)
BUN: 13 mg/dL (ref 8–23)
CO2: 25 mmol/L (ref 22–32)
Calcium: 9 mg/dL (ref 8.9–10.3)
Chloride: 106 mmol/L (ref 98–111)
Creatinine: 0.89 mg/dL (ref 0.61–1.24)
GFR, Estimated: >60 mL/min (ref >60)
Glucose, Bld: 90 mg/dL (ref 70–99)
Potassium: 3.8 mmol/L (ref 3.5–5.1)
Sodium: 137 mmol/L (ref 135–145)
Total Bilirubin: 1 mg/dL (ref 0.0–1.2)
Total Protein: 6.4 g/dL — ABNORMAL LOW (ref 6.5–8.1)

## 2024-01-27 LAB — CBC WITH DIFFERENTIAL (CANCER CENTER ONLY)
Abs Immature Granulocytes: 0.01 10*3/uL (ref 0.00–0.07)
Basophils Absolute: 0 10*3/uL (ref 0.0–0.1)
Basophils Relative: 1 %
Eosinophils Absolute: 0.2 10*3/uL (ref 0.0–0.5)
Eosinophils Relative: 3 %
HCT: 45.4 % (ref 39.0–52.0)
Hemoglobin: 15.4 g/dL (ref 13.0–17.0)
Immature Granulocytes: 0 %
Lymphocytes Relative: 25 %
Lymphs Abs: 1.2 10*3/uL (ref 0.7–4.0)
MCH: 28 pg (ref 26.0–34.0)
MCHC: 33.9 g/dL (ref 30.0–36.0)
MCV: 82.5 fL (ref 80.0–100.0)
Monocytes Absolute: 0.6 10*3/uL (ref 0.1–1.0)
Monocytes Relative: 13 %
Neutro Abs: 2.9 10*3/uL (ref 1.7–7.7)
Neutrophils Relative %: 58 %
Platelet Count: 148 10*3/uL — ABNORMAL LOW (ref 150–400)
RBC: 5.5 MIL/uL (ref 4.22–5.81)
RDW: 14.6 % (ref 11.5–15.5)
WBC Count: 4.9 10*3/uL (ref 4.0–10.5)
nRBC: 0 % (ref 0.0–0.2)

## 2024-01-27 MED ORDER — DEXAMETHASONE SODIUM PHOSPHATE 10 MG/ML IJ SOLN
8.0000 mg | Freq: Once | INTRAMUSCULAR | Status: AC
  Administered 2024-01-27: 8 mg via INTRAVENOUS
  Filled 2024-01-27: qty 1

## 2024-01-27 MED ORDER — SODIUM CHLORIDE 0.9% FLUSH: 10.0000 mL | INTRAVENOUS | Status: DC | PRN

## 2024-01-27 MED ORDER — PALONOSETRON HCL INJECTION 0.25 MG/5ML
0.2500 mg | Freq: Once | INTRAVENOUS | Status: AC
  Administered 2024-01-27: 0.25 mg via INTRAVENOUS
  Filled 2024-01-27: qty 5

## 2024-01-27 MED ORDER — DEXTROSE 5 % IV SOLN
70.0000 mg/m2 | Freq: Once | INTRAVENOUS | Status: AC
  Administered 2024-01-27: 140 mg via INTRAVENOUS
  Filled 2024-01-27: qty 60

## 2024-01-27 MED ORDER — SODIUM CHLORIDE 0.9 % IV SOLN: Freq: Once | INTRAVENOUS | Status: AC

## 2024-01-27 MED ORDER — HEPARIN SOD (PORK) LOCK FLUSH 100 UNIT/ML IV SOLN
500.0000 [IU] | Freq: Once | INTRAVENOUS | Status: DC | PRN
Start: 2024-01-27 — End: 2024-01-27

## 2024-01-27 MED ORDER — FENTANYL 12 MCG/HR TD PT72
1.0000 | MEDICATED_PATCH | TRANSDERMAL | 0 refills | Status: DC
  Filled 2024-01-27 – 2024-02-02 (×2): qty 10, 30d supply, fill #0

## 2024-01-27 MED ORDER — ZOLEDRONIC ACID 4 MG/100ML IV SOLN
4.0000 mg | Freq: Once | INTRAVENOUS | Status: AC
  Administered 2024-01-27: 4 mg via INTRAVENOUS
  Filled 2024-01-27: qty 100

## 2024-01-27 NOTE — Progress Notes (Signed)
 HEMATOLOGY/ONCOLOGY CLINIC NOTE  Date of Service: 02/01/24   Patient Care Team: Redmon, Noelle, [place] as PCP - General (Nurse Practitioner)  CHIEF COMPLAINTS/PURPOSE OF CONSULTATION:  Follow-up for continued evaluation and management of multiple myeloma  HISTORY OF PRESENTING ILLNESS:   Anthony [name] is a wonderful 63 y.o. male who has been referred to us  by Dr Diamond Formica, PA for evaluation and management of newly diagnosed multiple myeloma. He reports He is doing well.  He reports persistent lower back pain that he has had since he was in his 20's. He notes no previous significant injuries. He further notes that he gets chiropractic adjustments and takes Tylenol  to manage his symptoms. He notes less back pain when standing up on his right leg and maintaining weight on his right leg.  He reports previous history of smoking and chewing tobacco over 40 years ago.  He had a recent fall back in February of this year with only a pulled muscle. And he notes another fall when chasing his cat where he fell on his right shoulder. He reports pain in right shoulder.   He had a recent COVID-19 infection back in March.  He reports intermittent cough with SOB. He notes he takes 2 Rolaids at night. We discussed potentially trying antacids which he is agreeable to as he will begin steroids soon and was advised of the possible symptoms and side effects from taking steroids.  We discussed CRAB criteria and that he meets at least two of the criterion being  anemia and bone disease.  We discussed getting additional scans for further evaluation which he is agreeable to.  We further discussed starting Daratumumab /Velcade /Dexamethasone /Zometa  and Revlimid  for treatment which he is agreeable to. We also discussed starting steroids before treatment and taking an acid suppressant which he was also agreeable to.  We discussed getting Senna and taking it as needed for constipation.  Labs done today  were reviewed in detail.  We discussed his recent bone marrow biopsy and aspiration done 02/06/2022.  We discussed PET/CT scan done 01/28/2022.  We discussed CT right shoulder w/o contrast done 11/13/2021  INTERVAL HISTORY:  Anthony [name] is a 63 y.o. male here for continued evaluation of and management of multiple myeloma. Patient is here for toxicity check prior to receiving cycle 23 day 15 of his Carfilzomib /Revimid/Dexamethasone  treatment.  Patient was last seen by me on 12/29/2023 and reported mild fatigue.   He is accompanied by his wife during today's visit.   Patient reports some concern of Revlimid  impacting kidney function. He notes that his [facility] and was found to be 1.4. His creatinine is normal today at 0.89. I discussed that Revlimid  generally does not bother the kidney and there is no concern in this regard at this time.   Patient is on 25 MG losartan at this time for blood pressure management. He reports that his blood pressure has been somewhat high at home. Patient reports one blood pressure reading of 160/90. His blood pressure is noted to be 126/77 in clinic today.   He reports that he is trying to stay well-hydrated.   Patient complains of cramping In his torso lasting 5 minutes at a time, affecting the abdomen. Back, and neck. His cramping lasted for for 2-3 days.   He reports feeling paranoia in regards to cancer. Patient reports that he was seen by [facility] on March 17th, and his PSA level was noted to be elevated at 23.5. He  notes that his PSA level is regularly checked twice a year, and in the past, his PSA level was stable ranging 9-14 for several years.  When his PSA was found to be elevated in March, he did not have any imaging done at that time. He reports that his PSA lab did not take place soon after a rectal exam. He reports that he will be seen by a urologist in July to recheck PSA.   Patient reports that his last biopsy was in  2020, which showed no concerns besides benign enlargement of the prostate. At that time, his PSA ranged 9-14.   He denies any urinary symptoms such as discomfort passing urine. Patient reports frequent urination.   He reports that he was seen by his dentist last week and there were findings of white discoloration on his gums, which were thought to be likely benign. He will have a biopsy of the area tomorrow, 01/28/2024.   Patient reports that he was seen by  [facility] oncologist, Dr. Mee Spillers, yesterday, and was approved for care in the community and is able to continue receiving [facility] cancer care.   He denies any issues with his medications.   MEDICAL HISTORY:  Past Medical History:  Diagnosis Date   Back pain    Cancer (HCC)    Multiple myeloma (HCC) 02/15/2022    SURGICAL HISTORY: Past Surgical History:  Procedure Laterality Date   IR BONE TUMOR(S)RF ABLATION  05/07/2022   IR BONE TUMOR(S)RF ABLATION  05/07/2022   IR BONE TUMOR(S)RF ABLATION  05/07/2022   IR KYPHO EA ADDL LEVEL THORACIC OR LUMBAR  05/07/2022   IR KYPHO LUMBAR INC FX REDUCE BONE BX UNI/BIL CANNULATION INC/IMAGING  05/07/2022   IR KYPHO THORACIC WITH BONE BIOPSY  05/07/2022   IR RADIOLOGIST EVAL & MGMT  04/23/2022   IR RADIOLOGIST EVAL & MGMT  05/17/2022   IR RADIOLOGIST EVAL & MGMT  06/06/2022    SOCIAL HISTORY: Social History   Socioeconomic History   Marital status: Married    Spouse name: Not on file   Number of children: Not on file   Years of education: Not on file   Highest education level: Not on file  Occupational History   Not on file  Tobacco Use   Smoking status: Never   Smokeless tobacco: Former    Types: Chew, Snuff  Vaping Use   Vaping status: Never Used  Substance and Sexual Activity   Alcohol use: Yes    Alcohol/week: 2.0 standard drinks of alcohol    Types: 2 Glasses of wine per week   Drug use: No   Sexual activity: Yes  Other Topics Concern   Not on file  Social History Narrative    Not on file   Social Drivers of Health   Financial Resource Strain: Low Risk  (05/07/2022)   Overall Financial Resource Strain (CARDIA)    Difficulty of Paying Living Expenses: Not very hard  Food Insecurity: No Food Insecurity (05/07/2022)   Hunger Vital Sign    Worried About Running Out of Food in the Last Year: Never true    Ran Out of Food in the Last Year: Never true  Transportation Needs: No Transportation Needs (05/07/2022)   PRAPARE - Administrator, Civil Service (Medical): No    Lack of Transportation (Non-Medical): No  Physical Activity: Not on file  Stress: Not on file  Social Connections: Not on file  Intimate Partner Violence: Unknown (05/07/2022)   Humiliation, Afraid,  Rape, and Kick questionnaire    Fear of Current or Ex-Partner: No    Emotionally Abused: No    Physically Abused: No    Sexually Abused: Not on file    FAMILY HISTORY: Family History  Problem Relation Age of Onset   Rheum arthritis Mother    Other Sister        Pre-cancerous uterine mass;    Rheum arthritis Sister    Bone cancer Paternal Grandfather    Brain cancer Maternal Uncle    Brain cancer Maternal Aunt    Osteoporosis Neg Hx     ALLERGIES:  has no known allergies.  MEDICATIONS:  Current Outpatient Medications  Medication Sig Dispense Refill   acyclovir  (ZOVIRAX ) 400 MG tablet Take 1 tablet (400 mg total) by mouth 2 (two) times daily. 60 tablet 11   aluminum chloride (DRYSOL) 20 % external solution Apply 1 application topically at bedtime.     apixaban  (ELIQUIS ) 5 MG TABS tablet Take 1 tablet (5 mg total) by mouth 2 (two) times daily. 60 tablet 5   calcium-vitamin D  (OSCAL WITH D) 250-125 MG-UNIT tablet Take 1 tablet by mouth daily.     cyanocobalamin  (VITAMIN B12) 1000 MCG tablet Take 1 tablet (1,000 mcg total) by mouth daily. 30 tablet 3   cyclobenzaprine (FLEXERIL) 10 MG tablet Take 10 mg by mouth 3 (three) times daily as needed for muscle spasms. (Patient not taking:  Reported on 12/29/2023)     diclofenac sodium (VOLTAREN) 1 % GEL Apply 2 g topically as needed.     dronabinol  (MARINOL ) 10 MG capsule Take 1 capsule (10 mg total) by mouth 2 (two) times daily before a meal. (Patient not taking: Reported on 09/16/2023) 60 capsule 0   ergocalciferol  (VITAMIN D2) 1.25 MG (50000 UT) capsule Take 1 capsule (50,000 Units total) by mouth 2 (two) times a week. (Patient taking differently: Take 50,000 Units by mouth once a week.) 12 capsule 2   escitalopram (LEXAPRO) 10 MG tablet Take 10 mg by mouth daily. (Patient not taking: Reported on 09/16/2023)     esomeprazole  (NEXIUM ) 40 MG capsule Take 1 capsule (40 mg total) by mouth daily. Take 1 capsule (40 mg total) daily before breakfast 30 capsule 3   famotidine  (PEPCID ) 40 MG tablet Take 40 mg by mouth daily.     fentaNYL  (DURAGESIC ) 12 MCG/HR Place 1 patch onto the skin every 3 (three) days. 10 patch 0   lidocaine  (LIDODERM ) 5 % Place 1 patch onto the skin daily. Remove & Discard patch within 12 hours or as directed by MD 30 patch 0   loratadine (CLARITIN) 10 MG tablet Take 10 mg by mouth daily.     LORazepam  (ATIVAN ) 0.5 MG tablet Take 1 tablet (0.5 mg total) by mouth every 6 (six) hours as needed (Nausea or vomiting). 30 tablet 0   losartan (COZAAR) 50 MG tablet Take 25 mg by mouth.     methocarbamol  (ROBAXIN ) 500 MG tablet Take 1 tablet (500 mg total) by mouth every 8 (eight) hours as needed for muscle spasms. 30 tablet 0   metroNIDAZOLE  (METROGEL ) 1 % gel Apply topically daily. 45 g 0   Multiple Vitamin (MULTIVITAMIN) tablet Take 1 tablet by mouth daily.     Omega-3 Fatty Acids (FISH OIL) 1360 MG CAPS Take 1 capsule by mouth daily.     ondansetron  (ZOFRAN ) 8 MG tablet Take 1 tablet (8 mg total) by mouth every 8 (eight) hours as needed for nausea or vomiting. 30 tablet  0   oxyCODONE  (ROXICODONE ) 5 MG immediate release tablet Take 1 tablet (5 mg total) by mouth every 4 (four) hours as needed for severe pain (pain score 7-10)  or moderate pain (pain score 4-6). Take 1 to 2 tablets every 4 hours as needed for moderate to severe pain. 60 tablet 0   prochlorperazine  (COMPAZINE ) 10 MG tablet TAKE ONE TABLET BY MOUTH EVERY 6 HOURS AS NEEDED FOR NAUSEA AND VOMITING 30 tablet 1   REVLIMID  15 MG capsule TAKE 1 CAPSULE DAILY FOR 21 DAYS ON, THEN 7 DAYS OFF 21 capsule 0   senna-docusate (SENNA S) 8.6-50 MG tablet Take 2 tablets by mouth at bedtime. (Patient not taking: Reported on 12/29/2023) 60 tablet 1   terbinafine (LAMISIL) 1 % cream Apply 1 application topically 2 (two) times daily.     No current facility-administered medications for this visit.   REVIEW OF SYSTEMS:  10 Point review of Systems was done is negative except as noted above.   PHYSICAL EXAMINATION: .BP 126/77   Pulse 68   Temp 97.7 F (36.5 C)   Resp 20   Wt 186 lb 4.8 oz (84.5 kg)   SpO2 98%   BMI 33.00 kg/m    GENERAL:alert, in no acute distress and comfortable SKIN: no acute rashes, no significant lesions EYES: conjunctiva are pink and non-injected, sclera anicteric OROPHARYNX: MMM, no exudates, no oropharyngeal erythema or ulceration NECK: supple, no JVD LYMPH:  no palpable lymphadenopathy in the cervical, axillary or inguinal regions LUNGS: clear to auscultation b/l with normal respiratory effort HEART: regular rate & rhythm ABDOMEN:  normoactive bowel sounds , non tender, not distended. Extremity: no pedal edema PSYCH: alert & oriented x 3 with fluent speech NEURO: no focal motor/sensory deficits   LABORATORY DATA:  I have reviewed the data as listed .    Latest Ref Rng & Units 01/27/2024   10:47 AM 01/14/2024    9:05 AM 12/29/2023   10:21 AM  CBC  WBC 4.0 - 10.5 K/uL 4.9  3.7  4.1   Hemoglobin 13.0 - 17.0 g/dL 16.1  09.6  04.5   Hematocrit 39.0 - 52.0 % 45.4  44.6  44.0   Platelets 150 - 400 K/uL 148  222  138       Latest Ref Rng & Units 01/27/2024   10:47 AM 01/14/2024    9:05 AM 12/29/2023   10:21 AM  CMP  Glucose 70 - 99  mg/dL 90  90  79   BUN 8 - 23 mg/dL 13  17  12    Creatinine 0.61 - 1.24 mg/dL 4.09  8.11  9.14   Sodium 135 - 145 mmol/L 137  137  140   Potassium 3.5 - 5.1 mmol/L 3.8  4.1  3.7   Chloride 98 - 111 mmol/L 106  105  107   CO2 22 - 32 mmol/L 25  24  26    Calcium 8.9 - 10.3 mg/dL 9.0  9.5  8.5   Total Protein 6.5 - 8.1 g/dL 6.4  6.6  6.2   Total Bilirubin 0.0 - 1.2 mg/dL 1.0  0.8  0.8   Alkaline Phos 38 - 126 U/L 45  46  43   AST 15 - 41 U/L 15  13  11    ALT 0 - 44 U/L 15  13  8       PATHOLOGY Surgical Pathology CASE: [id number] PATIENT: Camellia Caves Bone Marrow Report     Clinical History: Multiple Myeloma  DIAGNOSIS:  BONE MARROW, ASPIRATE, CLOT, CORE: -Hypercellular bone marrow (60%) with erythroid predominant trilineage hematopoiesis and no evidence of residual plasma cell neoplasm (less than 1% plasma cells by manual aspirate differential, and CD138 immunohistochemical analysis of clot and core sections).  PERIPHERAL BLOOD: -Leukopenia  MICROSCOPIC DESCRIPTION:  PERIPHERAL BLOOD SMEAR: Platelets: Adequate in number, no platelet clumps identified Erythroid: Normocytic normochromic red blood cells Leukocytes: Mild leukopenia, negative for dysplastic granulocytes, blasts or  plasma cells  BONE MARROW ASPIRATE: Cellular Erythroid precursors: Relatively increased, show a full sequence of maturation with occasional abnormal forms (including binucleate forms, megaloblastoid change and nuclear membrane irregularities) Granulocytic precursors: Decreased, show a full sequence of generally orderly maturation Megakaryocytes: Normal in number and morphology Lymphocytes/plasma cells: Not increased   02/06/2022 Molecular pathology   RADIOGRAPHIC STUDIES: I have personally reviewed the radiological images as listed and agreed with the findings in the report. No results found.   ASSESSMENT & PLAN:   63 y.o. very pleasant male with  1. R-ISS stage III  multiple myeloma with extensive bone metastases and patholgiic fracture rt shoulder -Recent bone marrow biopsy and aspiration done 02/06/2022 revealed hypercellular bone marrow with plasma cell neoplasm. Cytogenetics-normal karyotype Molecular cytogenetics-TP53 deletion and duplication of 1 q.Aaron Aas  PLAN:  -Discussed lab results on 01/27/24 in detail with patient. CBC stable, showed WBC of 4.9K, hemoglobin of 15.4, and platelets of 148K. -patient reports previous creatinine level of 1.4 checked by [place]. Creatinine is normal today at 0.89.  -discussed that it is possible that he is better-hydrated now which might explain his normalized creatinine today at 0.89.  -Discussed that Losartan can cause fluctuation in creatinine -discussed that while it is possible that Losartan can make cholesterol levels higher to some extent, this is not physiologically concerning -reasonable to continue small-dose 25 MG losartan -his labs from 2 weeks ago showed undetectable M protein and he continues to be in remission -discussed that Revlimid  generally does not bother the kidney. Also discussed that if his kidney numbers change, then we would have to change his Revlimid  dose.  -patient has no major or acute new toxicities from his maintenance treatment with Carfilzomib  and Revlimid  -Continue carfilzomib  maintenance every 2 weeks and maintenance dose of Revlimid .  -he is appropriate to proceed with cycle 23 day 15 of his treatment today -discussed main concern of his elevated PSA level of 23.5, which certainly needs to be evaluated. He shall proceed with his Urology appointment currently scheduled for July -discussed that unless there is concern beyond the local prostate, his urologist will manage his Prostate issues -discussed that to some extent, certain somewhat-elevated blood pressures may have been related to being on high-dose steroids.  -recommend patient to stay well-hydrated -continue 1,000 mcg daily vitamin  B12  -continue B complex supplements. -continue ergocalciferol  50K units once a week -answered all of patient's and his wife's questions in detail -will extend visits to 2 months   FOLLOW-UP: Per integrated scheduling MD visit in 6-8 weeks The total time spent in the appointment was 30 minutes* .  All of the patient's questions were answered with apparent satisfaction. The patient knows to call the clinic with any problems, questions or concerns.   Jacquelyn Matt MD MS AAHIVMS [facility] Hematology/Oncology [facility]  .*Total Encounter Time as defined by the Centers for Medicare and Medicaid Services includes, in addition to the face-to-face time of a patient visit (documented in the note above) non-face-to-face time: obtaining and reviewing outside history, ordering and  reviewing medications, tests or procedures, care coordination (communications with other health care professionals or caregivers) and documentation in the medical record.    I,Mitra Faeizi,acting as a Neurosurgeon for Jacquelyn Matt, MD.,have documented all relevant documentation on the behalf of Jacquelyn Matt, MD,as directed by  Jacquelyn Matt, MD while in the presence of Jacquelyn Matt, MD.  .I have reviewed the above documentation for accuracy and completeness, and I agree with the above. .Matteus Mcnelly Kishore Giulianna Rocha MD

## 2024-01-27 NOTE — Patient Instructions (Signed)

## 2024-02-01 ENCOUNTER — Encounter: Payer: Self-pay | Admitting: Hematology

## 2024-02-02 ENCOUNTER — Other Ambulatory Visit: Payer: Self-pay

## 2024-02-02 ENCOUNTER — Other Ambulatory Visit (HOSPITAL_BASED_OUTPATIENT_CLINIC_OR_DEPARTMENT_OTHER): Payer: Self-pay

## 2024-02-02 DIAGNOSIS — L814 Other melanin hyperpigmentation: Secondary | ICD-10-CM | POA: Diagnosis not present

## 2024-02-02 DIAGNOSIS — L578 Other skin changes due to chronic exposure to nonionizing radiation: Secondary | ICD-10-CM | POA: Diagnosis not present

## 2024-02-02 DIAGNOSIS — D225 Melanocytic nevi of trunk: Secondary | ICD-10-CM | POA: Diagnosis not present

## 2024-02-02 DIAGNOSIS — L821 Other seborrheic keratosis: Secondary | ICD-10-CM | POA: Diagnosis not present

## 2024-02-03 DIAGNOSIS — K1329 Other disturbances of oral epithelium, including tongue: Secondary | ICD-10-CM | POA: Diagnosis not present

## 2024-02-04 ENCOUNTER — Other Ambulatory Visit: Payer: Self-pay

## 2024-02-04 DIAGNOSIS — C9000 Multiple myeloma not having achieved remission: Secondary | ICD-10-CM

## 2024-02-04 MED ORDER — LIDOCAINE 5 % EX PTCH: 1.0000 | MEDICATED_PATCH | CUTANEOUS | 0 refills | Status: DC

## 2024-02-11 ENCOUNTER — Inpatient Hospital Stay

## 2024-02-11 VITALS — BP 119/80 | HR 70 | Temp 98.7°F | Resp 16 | Wt 186.2 lb

## 2024-02-11 DIAGNOSIS — R5383 Other fatigue: Secondary | ICD-10-CM | POA: Diagnosis not present

## 2024-02-11 DIAGNOSIS — R0602 Shortness of breath: Secondary | ICD-10-CM | POA: Diagnosis not present

## 2024-02-11 DIAGNOSIS — M545 Low back pain, unspecified: Secondary | ICD-10-CM | POA: Diagnosis not present

## 2024-02-11 DIAGNOSIS — Z809 Family history of malignant neoplasm, unspecified: Secondary | ICD-10-CM | POA: Diagnosis not present

## 2024-02-11 DIAGNOSIS — Z7952 Long term (current) use of systemic steroids: Secondary | ICD-10-CM | POA: Diagnosis not present

## 2024-02-11 DIAGNOSIS — R350 Frequency of micturition: Secondary | ICD-10-CM | POA: Diagnosis not present

## 2024-02-11 DIAGNOSIS — M25511 Pain in right shoulder: Secondary | ICD-10-CM | POA: Diagnosis not present

## 2024-02-11 DIAGNOSIS — R059 Cough, unspecified: Secondary | ICD-10-CM | POA: Diagnosis not present

## 2024-02-11 DIAGNOSIS — Z5112 Encounter for antineoplastic immunotherapy: Secondary | ICD-10-CM | POA: Diagnosis not present

## 2024-02-11 DIAGNOSIS — Z808 Family history of malignant neoplasm of other organs or systems: Secondary | ICD-10-CM | POA: Diagnosis not present

## 2024-02-11 DIAGNOSIS — Z87891 Personal history of nicotine dependence: Secondary | ICD-10-CM | POA: Diagnosis not present

## 2024-02-11 DIAGNOSIS — C9000 Multiple myeloma not having achieved remission: Secondary | ICD-10-CM

## 2024-02-11 DIAGNOSIS — Z7189 Other specified counseling: Secondary | ICD-10-CM

## 2024-02-11 DIAGNOSIS — Z79899 Other long term (current) drug therapy: Secondary | ICD-10-CM | POA: Diagnosis not present

## 2024-02-11 LAB — CMP (CANCER CENTER ONLY)
ALT: 12 U/L (ref 0–44)
AST: 13 U/L — ABNORMAL LOW (ref 15–41)
Albumin: 4.3 g/dL (ref 3.5–5.0)
Alkaline Phosphatase: 49 U/L (ref 38–126)
Anion gap: 7 (ref 5–15)
BUN: 15 mg/dL (ref 8–23)
CO2: 22 mmol/L (ref 22–32)
Calcium: 9.1 mg/dL (ref 8.9–10.3)
Chloride: 106 mmol/L (ref 98–111)
Creatinine: 0.82 mg/dL (ref 0.61–1.24)
GFR, Estimated: >60 mL/min (ref >60)
Glucose, Bld: 102 mg/dL — ABNORMAL HIGH (ref 70–99)
Potassium: 4.1 mmol/L (ref 3.5–5.1)
Sodium: 135 mmol/L (ref 135–145)
Total Bilirubin: 0.7 mg/dL (ref 0.0–1.2)
Total Protein: 6.4 g/dL — ABNORMAL LOW (ref 6.5–8.1)

## 2024-02-11 LAB — CBC WITH DIFFERENTIAL (CANCER CENTER ONLY)
Abs Immature Granulocytes: 0.03 10*3/uL (ref 0.00–0.07)
Basophils Absolute: 0 10*3/uL (ref 0.0–0.1)
Basophils Relative: 1 %
Eosinophils Absolute: 0.1 10*3/uL (ref 0.0–0.5)
Eosinophils Relative: 3 %
HCT: 44.7 % (ref 39.0–52.0)
Hemoglobin: 15.1 g/dL (ref 13.0–17.0)
Immature Granulocytes: 1 %
Lymphocytes Relative: 23 %
Lymphs Abs: 0.9 10*3/uL (ref 0.7–4.0)
MCH: 28.1 pg (ref 26.0–34.0)
MCHC: 33.8 g/dL (ref 30.0–36.0)
MCV: 83.2 fL (ref 80.0–100.0)
Monocytes Absolute: 0.3 10*3/uL (ref 0.1–1.0)
Monocytes Relative: 9 %
Neutro Abs: 2.5 10*3/uL (ref 1.7–7.7)
Neutrophils Relative %: 63 %
Platelet Count: 209 10*3/uL (ref 150–400)
RBC: 5.37 MIL/uL (ref 4.22–5.81)
RDW: 15 % (ref 11.5–15.5)
WBC Count: 3.8 10*3/uL — ABNORMAL LOW (ref 4.0–10.5)
nRBC: 0 % (ref 0.0–0.2)

## 2024-02-11 MED ORDER — PALONOSETRON HCL INJECTION 0.25 MG/5ML
0.2500 mg | Freq: Once | INTRAVENOUS | Status: AC
  Administered 2024-02-11: 0.25 mg via INTRAVENOUS
  Filled 2024-02-11: qty 5

## 2024-02-11 MED ORDER — SODIUM CHLORIDE 0.9 % IV SOLN: Freq: Once | INTRAVENOUS | Status: AC

## 2024-02-11 MED ORDER — DEXAMETHASONE SODIUM PHOSPHATE 10 MG/ML IJ SOLN
8.0000 mg | Freq: Once | INTRAMUSCULAR | Status: AC
  Administered 2024-02-11: 8 mg via INTRAVENOUS
  Filled 2024-02-11: qty 1

## 2024-02-11 MED ORDER — DEXTROSE 5 % IV SOLN
70.0000 mg/m2 | Freq: Once | INTRAVENOUS | Status: AC
  Administered 2024-02-11: 140 mg via INTRAVENOUS
  Filled 2024-02-11: qty 60

## 2024-02-11 MED ORDER — ACETAMINOPHEN 325 MG PO TABS
650.0000 mg | ORAL_TABLET | Freq: Once | ORAL | Status: AC
  Administered 2024-02-11: 650 mg via ORAL
  Filled 2024-02-11: qty 2

## 2024-02-11 NOTE — Patient Instructions (Signed)

## 2024-02-13 ENCOUNTER — Other Ambulatory Visit: Payer: Self-pay

## 2024-02-13 DIAGNOSIS — C9000 Multiple myeloma not having achieved remission: Secondary | ICD-10-CM

## 2024-02-13 LAB — MULTIPLE MYELOMA PANEL, SERUM
Albumin SerPl Elph-Mcnc: 4 g/dL (ref 2.9–4.4)
Albumin/Glob SerPl: 2 — ABNORMAL HIGH (ref 0.7–1.7)
Alpha 1: 0.2 g/dL (ref 0.0–0.4)
Alpha2 Glob SerPl Elph-Mcnc: 0.6 g/dL (ref 0.4–1.0)
B-Globulin SerPl Elph-Mcnc: 0.9 g/dL (ref 0.7–1.3)
Gamma Glob SerPl Elph-Mcnc: 0.4 g/dL (ref 0.4–1.8)
Globulin, Total: 2.1 g/dL — ABNORMAL LOW (ref 2.2–3.9)
IgA: 19 mg/dL — ABNORMAL LOW (ref 61–437)
IgG (Immunoglobin G), Serum: 526 mg/dL — ABNORMAL LOW (ref 603–1613)
IgM (Immunoglobulin M), Srm: 14 mg/dL — ABNORMAL LOW (ref 20–172)
Total Protein ELP: 6.1 g/dL (ref 6.0–8.5)

## 2024-02-13 MED ORDER — LIDOCAINE 5 % EX PTCH: 1.0000 | MEDICATED_PATCH | CUTANEOUS | 0 refills | Status: AC

## 2024-02-13 MED ORDER — APIXABAN 5 MG PO TABS: 5.0000 mg | ORAL_TABLET | Freq: Two times a day (BID) | ORAL | 5 refills | Status: DC

## 2024-02-17 ENCOUNTER — Other Ambulatory Visit: Payer: Self-pay

## 2024-02-17 DIAGNOSIS — C9000 Multiple myeloma not having achieved remission: Secondary | ICD-10-CM

## 2024-02-17 MED ORDER — ACYCLOVIR 400 MG PO TABS
400.0000 mg | ORAL_TABLET | Freq: Two times a day (BID) | ORAL | 11 refills | Status: DC
Start: 2024-02-17 — End: 2024-02-26

## 2024-02-18 ENCOUNTER — Other Ambulatory Visit: Payer: Self-pay

## 2024-02-18 DIAGNOSIS — C9000 Multiple myeloma not having achieved remission: Secondary | ICD-10-CM

## 2024-02-18 MED ORDER — APIXABAN 5 MG PO TABS: 5.0000 mg | ORAL_TABLET | Freq: Two times a day (BID) | ORAL | 0 refills | Status: DC

## 2024-02-19 ENCOUNTER — Other Ambulatory Visit: Payer: Self-pay

## 2024-02-19 DIAGNOSIS — C9000 Multiple myeloma not having achieved remission: Secondary | ICD-10-CM

## 2024-02-19 MED ORDER — REVLIMID 15 MG PO CAPS: ORAL_CAPSULE | ORAL | 0 refills | Status: DC

## 2024-02-24 ENCOUNTER — Inpatient Hospital Stay

## 2024-02-24 ENCOUNTER — Inpatient Hospital Stay: Attending: Physician Assistant

## 2024-02-24 ENCOUNTER — Other Ambulatory Visit

## 2024-02-24 ENCOUNTER — Ambulatory Visit: Admitting: Hematology and Oncology

## 2024-02-24 VITALS — BP 134/81 | HR 58 | Temp 98.2°F | Resp 17 | Ht 63.0 in | Wt 187.5 lb

## 2024-02-24 DIAGNOSIS — Z87891 Personal history of nicotine dependence: Secondary | ICD-10-CM | POA: Insufficient documentation

## 2024-02-24 DIAGNOSIS — R350 Frequency of micturition: Secondary | ICD-10-CM | POA: Insufficient documentation

## 2024-02-24 DIAGNOSIS — M545 Low back pain, unspecified: Secondary | ICD-10-CM | POA: Insufficient documentation

## 2024-02-24 DIAGNOSIS — Z809 Family history of malignant neoplasm, unspecified: Secondary | ICD-10-CM | POA: Insufficient documentation

## 2024-02-24 DIAGNOSIS — Z79899 Other long term (current) drug therapy: Secondary | ICD-10-CM | POA: Diagnosis not present

## 2024-02-24 DIAGNOSIS — Z7189 Other specified counseling: Secondary | ICD-10-CM

## 2024-02-24 DIAGNOSIS — C9000 Multiple myeloma not having achieved remission: Secondary | ICD-10-CM

## 2024-02-24 DIAGNOSIS — Z808 Family history of malignant neoplasm of other organs or systems: Secondary | ICD-10-CM | POA: Diagnosis not present

## 2024-02-24 DIAGNOSIS — M25511 Pain in right shoulder: Secondary | ICD-10-CM | POA: Diagnosis not present

## 2024-02-24 DIAGNOSIS — R059 Cough, unspecified: Secondary | ICD-10-CM | POA: Insufficient documentation

## 2024-02-24 DIAGNOSIS — R0602 Shortness of breath: Secondary | ICD-10-CM | POA: Insufficient documentation

## 2024-02-24 DIAGNOSIS — Z7952 Long term (current) use of systemic steroids: Secondary | ICD-10-CM | POA: Diagnosis not present

## 2024-02-24 DIAGNOSIS — R5383 Other fatigue: Secondary | ICD-10-CM | POA: Diagnosis not present

## 2024-02-24 DIAGNOSIS — Z5112 Encounter for antineoplastic immunotherapy: Secondary | ICD-10-CM | POA: Diagnosis present

## 2024-02-24 LAB — CMP (CANCER CENTER ONLY)
ALT: 12 U/L (ref 0–44)
AST: 12 U/L — ABNORMAL LOW (ref 15–41)
Albumin: 4 g/dL (ref 3.5–5.0)
Alkaline Phosphatase: 44 U/L (ref 38–126)
Anion gap: 5 (ref 5–15)
BUN: 11 mg/dL (ref 8–23)
CO2: 24 mmol/L (ref 22–32)
Calcium: 8.8 mg/dL — ABNORMAL LOW (ref 8.9–10.3)
Chloride: 108 mmol/L (ref 98–111)
Creatinine: 0.82 mg/dL (ref 0.61–1.24)
GFR, Estimated: >60 mL/min (ref >60)
Glucose, Bld: 85 mg/dL (ref 70–99)
Potassium: 4.1 mmol/L (ref 3.5–5.1)
Sodium: 137 mmol/L (ref 135–145)
Total Bilirubin: 1 mg/dL (ref 0.0–1.2)
Total Protein: 5.7 g/dL — ABNORMAL LOW (ref 6.5–8.1)

## 2024-02-24 LAB — CBC WITH DIFFERENTIAL (CANCER CENTER ONLY)
Abs Immature Granulocytes: 0.02 K/uL (ref 0.00–0.07)
Basophils Absolute: 0 K/uL (ref 0.0–0.1)
Basophils Relative: 1 %
Eosinophils Absolute: 0.1 K/uL (ref 0.0–0.5)
Eosinophils Relative: 4 %
HCT: 43.3 % (ref 39.0–52.0)
Hemoglobin: 14.8 g/dL (ref 13.0–17.0)
Immature Granulocytes: 1 %
Lymphocytes Relative: 24 %
Lymphs Abs: 0.8 K/uL (ref 0.7–4.0)
MCH: 28.5 pg (ref 26.0–34.0)
MCHC: 34.2 g/dL (ref 30.0–36.0)
MCV: 83.4 fL (ref 80.0–100.0)
Monocytes Absolute: 0.5 K/uL (ref 0.1–1.0)
Monocytes Relative: 14 %
Neutro Abs: 1.9 K/uL (ref 1.7–7.7)
Neutrophils Relative %: 56 %
Platelet Count: 117 K/uL — ABNORMAL LOW (ref 150–400)
RBC: 5.19 MIL/uL (ref 4.22–5.81)
RDW: 14.8 % (ref 11.5–15.5)
WBC Count: 3.4 K/uL — ABNORMAL LOW (ref 4.0–10.5)
nRBC: 0 % (ref 0.0–0.2)

## 2024-02-24 MED ORDER — PALONOSETRON HCL INJECTION 0.25 MG/5ML
0.2500 mg | Freq: Once | INTRAVENOUS | Status: AC
  Administered 2024-02-24: 0.25 mg via INTRAVENOUS
  Filled 2024-02-24: qty 5

## 2024-02-24 MED ORDER — ZOLEDRONIC ACID 4 MG/100ML IV SOLN
4.0000 mg | Freq: Once | INTRAVENOUS | Status: AC
  Administered 2024-02-24: 4 mg via INTRAVENOUS
  Filled 2024-02-24: qty 100

## 2024-02-24 MED ORDER — SODIUM CHLORIDE 0.9 % IV SOLN: Freq: Once | INTRAVENOUS | Status: AC

## 2024-02-24 MED ORDER — HEPARIN SOD (PORK) LOCK FLUSH 100 UNIT/ML IV SOLN
500.0000 [IU] | Freq: Once | INTRAVENOUS | Status: DC | PRN
Start: 2024-02-24 — End: 2024-02-24

## 2024-02-24 MED ORDER — DEXAMETHASONE SODIUM PHOSPHATE 10 MG/ML IJ SOLN
8.0000 mg | Freq: Once | INTRAMUSCULAR | Status: AC
  Administered 2024-02-24: 8 mg via INTRAVENOUS
  Filled 2024-02-24: qty 1

## 2024-02-24 MED ORDER — DEXTROSE 5 % IV SOLN
70.0000 mg/m2 | Freq: Once | INTRAVENOUS | Status: AC
  Administered 2024-02-24: 140 mg via INTRAVENOUS
  Filled 2024-02-24: qty 60

## 2024-02-24 MED ORDER — SODIUM CHLORIDE 0.9% FLUSH: 10.0000 mL | INTRAVENOUS | Status: DC | PRN

## 2024-02-24 NOTE — Patient Instructions (Addendum)
 [facility]  Discharge Instructions: Thank you for choosing [facility] to provide your oncology and hematology care.   If you have a lab appointment with the Cancer Center, please go directly to the Cancer Center and check in at the registration area.   Wear comfortable clothing and clothing appropriate for easy access to any Portacath or PICC line.   We strive to give you quality time with your provider. You may need to reschedule your appointment if you arrive late (15 or more minutes).  Arriving late affects you and other patients whose appointments are after yours.  Also, if you miss three or more appointments without notifying the office, you may be dismissed from the clinic at the provider's discretion.      For prescription refill requests, have your pharmacy contact our office and allow 72 hours for refills to be completed.    Today you received the following chemotherapy and/or immunotherapy agent: Carfilzomib (Kyprolis)      To help prevent nausea and vomiting after your treatment, we encourage you to take your nausea medication as directed.  BELOW ARE SYMPTOMS THAT SHOULD BE REPORTED IMMEDIATELY: *FEVER GREATER THAN 100.4 F (38 C) OR HIGHER *CHILLS OR SWEATING *NAUSEA AND VOMITING THAT IS NOT CONTROLLED WITH YOUR NAUSEA MEDICATION *UNUSUAL SHORTNESS OF BREATH *UNUSUAL BRUISING OR BLEEDING *URINARY PROBLEMS (pain or burning when urinating, or frequent urination) *BOWEL PROBLEMS (unusual diarrhea, constipation, pain near the anus) TENDERNESS IN MOUTH AND THROAT WITH OR WITHOUT PRESENCE OF ULCERS (sore throat, sores in mouth, or a toothache) UNUSUAL RASH, SWELLING OR PAIN  UNUSUAL VAGINAL DISCHARGE OR ITCHING   Items with * indicate a potential emergency and should be followed up as soon as possible or go to the Emergency Department if any problems should occur.  Please show the CHEMOTHERAPY ALERT CARD or  IMMUNOTHERAPY ALERT CARD at check-in to the Emergency Department and triage nurse.  Should you have questions after your visit or need to cancel or reschedule your appointment, please contact [facility]  Dept: [phone]  and follow the prompts.  Office hours are 8:00 a.m. to 4:30 p.m. Monday - Friday. Please note that voicemails left after 4:00 p.m. may not be returned until the following business day.  We are closed weekends and major holidays. You have access to a nurse at all times for urgent questions. Please call the main number to the clinic Dept: [phone] and follow the prompts.   For any non-urgent questions, you may also contact your provider using MyChart. We now offer e-Visits for anyone 22 and older to request care online for non-urgent symptoms. For details visit [email or web address].   Also download the MyChart app! Go to the app store, search "MyChart", open the app, select [place], and log in with your MyChart username and password.  Carfilzomib Injection What is this medication? CARFILZOMIB (kar FILZ oh mib) treats multiple myeloma, a type of bone marrow cancer. It works by blocking a protein that causes cancer cells to grow and multiply. This helps to slow or stop the spread of cancer cells. This medicine may be used for other purposes; ask your health care provider or pharmacist if you have questions. COMMON BRAND NAME(S): KYPROLIS What should I tell my care team before I take this medication? They need to know if you have any of these conditions:  Heart disease History of blood clots Irregular heartbeat Kidney disease Liver disease Lung or breathing disease An unusual or allergic reaction to carfilzomib, or other medications, foods, dyes, or preservatives If you or your partner are pregnant or trying to get pregnant Breastfeeding How should I use this medication? This medication is injected into a  vein. It is given by your care team in a hospital or clinic setting. Talk to your care team about the use of this medication in children. Special care may be needed. Overdosage: If you think you have taken too much of this medicine contact a poison control center or emergency room at once. NOTE: This medicine is only for you. Do not share this medicine with others. What if I miss a dose? Keep appointments for follow-up doses. It is important not to miss your dose. Call your care team if you are unable to keep an appointment. What may interact with this medication? Interactions are not expected. This list may not describe all possible interactions. Give your health care provider a list of all the medicines, herbs, non-prescription drugs, or dietary supplements you use. Also tell them if you smoke, drink alcohol, or use illegal drugs. Some items may interact with your medicine. What should I watch for while using this medication? Your condition will be monitored carefully while you are receiving this medication. You may need blood work while taking this medication. Check with your care team if you have severe diarrhea, nausea, and vomiting, or if you sweat a lot. The loss of too much body fluid may make it dangerous for you to take this medication. This medication may affect your coordination, reaction time, or judgment. Do not drive or operate machinery until you know how this medication affects you. Sit up or stand slowly to reduce the risk of dizzy or fainting spells. Drinking alcohol with this medication can increase the risk of these side effects. Talk to your care team if you may be pregnant. Serious birth defects can occur if you take this medication during pregnancy and for 6 months after the last dose. You will need a negative pregnancy test before starting this medication. Contraception is recommended while taking this medication and for 6 months after the last dose. Your care team can help you  find an option that works for you. If your partner can get pregnant, use a condom during sex while taking this medication and for 3 months after the last dose. Do not breastfeed while taking this medication and for 2 weeks after the last dose. This medication may cause infertility. Talk to your care team if you are concerned about your fertility. What side effects may I notice from receiving this medication? Side effects that you should report to your care team as soon as possible: Allergic reactions--skin rash, itching, hives, swelling of the face, lips, tongue, or throat Bleeding--bloody or black, tar-like stools, vomiting blood or brown material that looks like coffee grounds, red or dark brown urine, small red or purple spots on skin, unusual bruising or bleeding Blood clot--pain, swelling, or warmth in the leg, shortness of breath, chest pain Dizziness, loss of balance or coordination, confusion or trouble speaking Heart attack--pain or tightness in the chest, shoulders, arms, or jaw, nausea, shortness of breath, cold or clammy skin, feeling faint or lightheaded Heart failure--shortness of breath, swelling of the ankles, feet, or hands, sudden weight gain, unusual weakness or fatigue Heart rhythm changes--fast or irregular heartbeat, dizziness, feeling faint or lightheaded, chest pain, trouble breathing  Increase in blood pressure Infection--fever, chills, cough, sore throat, wounds that don't heal, pain or trouble when passing urine, general feeling of discomfort or being unwell Infusion reactions--chest pain, shortness of breath or trouble breathing, feeling faint or lightheaded Kidney injury--decrease in the amount of urine, swelling of the ankles, hands, or feet Liver injury--right upper belly pain, loss of appetite, nausea, light-colored stool, dark yellow or brown urine, yellowing skin or eyes, unusual weakness or fatigue Lung injury--shortness of breath or trouble breathing, cough,  spitting up blood, chest pain, fever Pulmonary hypertension--shortness of breath, chest pain, fast or irregular heartbeat, feeling faint or lightheaded, fatigue, swelling of the ankles or feet Stomach pain, bloody diarrhea, pale skin, unusual weakness or fatigue, decrease in the amount of urine, which may be signs of hemolytic uremic syndrome Sudden and severe headache, confusion, change in vision, seizures, which may be signs of posterior reversible encephalopathy syndrome (PRES) TTP--purple spots on the skin or inside the mouth, pale skin, yellowing skin or eyes, unusual weakness or fatigue, fever, fast or irregular heartbeat, confusion, change in vision, trouble speaking, trouble walking Tumor lysis syndrome (TLS)--nausea, vomiting, diarrhea, decrease in the amount of urine, dark urine, unusual weakness or fatigue, confusion, muscle pain or cramps, fast or irregular heartbeat, joint pain Side effects that usually do not require medical attention (report to your care team if they continue or are bothersome): Diarrhea Fatigue Nausea Trouble sleeping This list may not describe all possible side effects. Call your doctor for medical advice about side effects. You may report side effects to FDA at 1-800-FDA-1088. Where should I keep my medication? This medication is given in a hospital or clinic. It will not be stored at home. NOTE: This sheet is a summary. It may not cover all possible information. If you have questions about this medicine, talk to your doctor, pharmacist, or health care provider.  2024 Elsevier/Gold Standard (2022-01-03 00:00:00)

## 2024-02-26 ENCOUNTER — Telehealth: Payer: Self-pay

## 2024-02-26 ENCOUNTER — Other Ambulatory Visit: Payer: Self-pay

## 2024-02-26 DIAGNOSIS — C9000 Multiple myeloma not having achieved remission: Secondary | ICD-10-CM

## 2024-02-26 MED ORDER — ACYCLOVIR 400 MG PO TABS: 400.0000 mg | ORAL_TABLET | Freq: Two times a day (BID) | ORAL | 0 refills | Status: AC

## 2024-02-26 MED ORDER — REVLIMID 15 MG PO CAPS: ORAL_CAPSULE | ORAL | 0 refills | Status: DC

## 2024-02-26 NOTE — Telephone Encounter (Signed)
 Oral Oncology Patient Advocate Encounter  Patient has an active referral on file with the [facility].  Lenalidomide  will be sent there for processing.  I will continue to follow until medication is dispensed to ensure no further authorization is needed.   Charlott Hamilton,  CPhT-Adv  she/her/hers [facility]  [facility] Pharmacy Technician Patient Advocate Specialist III [facility] Phone: [phone]  Fax: [phone] [email or web address]

## 2024-02-27 ENCOUNTER — Other Ambulatory Visit: Payer: Self-pay

## 2024-02-27 DIAGNOSIS — C9000 Multiple myeloma not having achieved remission: Secondary | ICD-10-CM

## 2024-02-27 NOTE — Telephone Encounter (Signed)
 Oral Oncology Patient Advocate Encounter  I followed up  via phone with the [facility] . They told me to expect  7-10 days for for prescription processing. I requested the prescription to be expedited. They are unsure if this can be done.  I will continue to follow up.    Anthony [name],  CPhT-Adv  she/her/hers [facility]  [facility] Services Pharmacy Technician Patient Advocate Specialist III [facility] Phone: [phone]  Fax: [phone] [name]

## 2024-03-01 ENCOUNTER — Encounter: Payer: Self-pay | Admitting: Hematology

## 2024-03-01 MED ORDER — FENTANYL 12 MCG/HR TD PT72: 1.0000 | MEDICATED_PATCH | TRANSDERMAL | 0 refills | Status: DC

## 2024-03-01 NOTE — Telephone Encounter (Signed)
 Oral Oncology Patient Advocate Encounter  [facility] and [facility] notes and labs to complete a prior Auth. On 02-27-24  I will continue to follow up.

## 2024-03-05 ENCOUNTER — Other Ambulatory Visit: Payer: Self-pay

## 2024-03-05 NOTE — Telephone Encounter (Signed)
 Oral Oncology Patient Advocate Encounter  [facility] confirmed patient received delivery  of Lenalidomide  on 03/04/24.

## 2024-03-10 ENCOUNTER — Other Ambulatory Visit

## 2024-03-10 ENCOUNTER — Inpatient Hospital Stay

## 2024-03-10 ENCOUNTER — Encounter: Payer: Self-pay | Admitting: Hematology

## 2024-03-10 ENCOUNTER — Inpatient Hospital Stay: Admitting: Dietician

## 2024-03-10 VITALS — BP 147/84 | HR 68 | Temp 97.9°F | Resp 16 | Wt 188.0 lb

## 2024-03-10 DIAGNOSIS — C9000 Multiple myeloma not having achieved remission: Secondary | ICD-10-CM

## 2024-03-10 DIAGNOSIS — Z7189 Other specified counseling: Secondary | ICD-10-CM

## 2024-03-10 DIAGNOSIS — Z5112 Encounter for antineoplastic immunotherapy: Secondary | ICD-10-CM | POA: Diagnosis not present

## 2024-03-10 LAB — CMP (CANCER CENTER ONLY)
ALT: 11 U/L (ref 0–44)
AST: 12 U/L — ABNORMAL LOW (ref 15–41)
Albumin: 4 g/dL (ref 3.5–5.0)
Alkaline Phosphatase: 51 U/L (ref 38–126)
Anion gap: 7 (ref 5–15)
BUN: 16 mg/dL (ref 8–23)
CO2: 24 mmol/L (ref 22–32)
Calcium: 8.9 mg/dL (ref 8.9–10.3)
Chloride: 105 mmol/L (ref 98–111)
Creatinine: 0.93 mg/dL (ref 0.61–1.24)
GFR, Estimated: >60 mL/min
Glucose, Bld: 94 mg/dL (ref 70–99)
Potassium: 4 mmol/L (ref 3.5–5.1)
Sodium: 136 mmol/L (ref 135–145)
Total Bilirubin: 0.6 mg/dL (ref 0.0–1.2)
Total Protein: 6.1 g/dL — ABNORMAL LOW (ref 6.5–8.1)

## 2024-03-10 LAB — CBC WITH DIFFERENTIAL (CANCER CENTER ONLY)
Abs Immature Granulocytes: 0.05 K/uL (ref 0.00–0.07)
Basophils Absolute: 0 K/uL (ref 0.0–0.1)
Basophils Relative: 1 %
Eosinophils Absolute: 0.1 K/uL (ref 0.0–0.5)
Eosinophils Relative: 4 %
HCT: 42 % (ref 39.0–52.0)
Hemoglobin: 14.4 g/dL (ref 13.0–17.0)
Immature Granulocytes: 1 %
Lymphocytes Relative: 25 %
Lymphs Abs: 1 K/uL (ref 0.7–4.0)
MCH: 28.8 pg (ref 26.0–34.0)
MCHC: 34.3 g/dL (ref 30.0–36.0)
MCV: 84 fL (ref 80.0–100.0)
Monocytes Absolute: 0.3 K/uL (ref 0.1–1.0)
Monocytes Relative: 9 %
Neutro Abs: 2.4 K/uL (ref 1.7–7.7)
Neutrophils Relative %: 60 %
Platelet Count: 226 K/uL (ref 150–400)
RBC: 5 MIL/uL (ref 4.22–5.81)
RDW: 14.9 % (ref 11.5–15.5)
WBC Count: 4 K/uL (ref 4.0–10.5)
nRBC: 0 % (ref 0.0–0.2)

## 2024-03-10 MED ORDER — DEXAMETHASONE SODIUM PHOSPHATE 10 MG/ML IJ SOLN
8.0000 mg | Freq: Once | INTRAMUSCULAR | Status: AC
  Administered 2024-03-10: 8 mg via INTRAVENOUS
  Filled 2024-03-10: qty 1

## 2024-03-10 MED ORDER — ACETAMINOPHEN 325 MG PO TABS
650.0000 mg | ORAL_TABLET | Freq: Once | ORAL | Status: AC
  Administered 2024-03-10: 650 mg via ORAL
  Filled 2024-03-10: qty 2

## 2024-03-10 MED ORDER — SODIUM CHLORIDE 0.9 % IV SOLN
Freq: Once | INTRAVENOUS | Status: AC
Start: 2024-03-10 — End: 2024-03-10

## 2024-03-10 MED ORDER — PALONOSETRON HCL INJECTION 0.25 MG/5ML
0.2500 mg | Freq: Once | INTRAVENOUS | Status: AC
  Administered 2024-03-10: 0.25 mg via INTRAVENOUS
  Filled 2024-03-10: qty 5

## 2024-03-10 MED ORDER — DEXTROSE 5 % IV SOLN
70.0000 mg/m2 | Freq: Once | INTRAVENOUS | Status: AC
  Administered 2024-03-10: 140 mg via INTRAVENOUS
  Filled 2024-03-10: qty 60

## 2024-03-10 NOTE — Patient Instructions (Addendum)
 [facility]  Discharge Instructions: Patient to use ice packs, anti histamines (over the counter) and topical steroids (over the counter) per Dr. Onesimo  Thank you for choosing [facility] to provide your oncology and hematology care.   If you have a lab appointment with the Cancer Center, please go directly to the Cancer Center and check in at the registration area.   Wear comfortable clothing and clothing appropriate for easy access to any Portacath or PICC line.   We strive to give you quality time with your provider. You may need to reschedule your appointment if you arrive late (15 or more minutes).  Arriving late affects you and other patients whose appointments are after yours.  Also, if you miss three or more appointments without notifying the office, you may be dismissed from the clinic at the provider's discretion.      For prescription refill requests, have your pharmacy contact our office and allow 72 hours for refills to be completed.    Today you received the following chemotherapy and/or immunotherapy agents kyprolis        To help prevent nausea and vomiting after your treatment, we encourage you to take your nausea medication as directed.  BELOW ARE SYMPTOMS THAT SHOULD BE REPORTED IMMEDIATELY: *FEVER GREATER THAN 100.4 F (38 C) OR HIGHER *CHILLS OR SWEATING *NAUSEA AND VOMITING THAT IS NOT CONTROLLED WITH YOUR NAUSEA MEDICATION *UNUSUAL SHORTNESS OF BREATH *UNUSUAL BRUISING OR BLEEDING *URINARY PROBLEMS (pain or burning when urinating, or frequent urination) *BOWEL PROBLEMS (unusual diarrhea, constipation, pain near the anus) TENDERNESS IN MOUTH AND THROAT WITH OR WITHOUT PRESENCE OF ULCERS (sore throat, sores in mouth, or a toothache) UNUSUAL RASH, SWELLING OR PAIN  UNUSUAL VAGINAL DISCHARGE OR ITCHING   Items with * indicate a potential emergency and should be followed up as soon as possible or go to  the Emergency Department if any problems should occur.  Please show the CHEMOTHERAPY ALERT CARD or IMMUNOTHERAPY ALERT CARD at check-in to the Emergency Department and triage nurse.  Should you have questions after your visit or need to cancel or reschedule your appointment, please contact [facility]  Dept: [phone]  and follow the prompts.  Office hours are 8:00 a.m. to 4:30 p.m. Monday - Friday. Please note that voicemails left after 4:00 p.m. may not be returned until the following business day.  We are closed weekends and major holidays. You have access to a nurse at all times for urgent questions. Please call the main number to the clinic Dept: [phone] and follow the prompts.   For any non-urgent questions, you may also contact your provider using MyChart. We now offer e-Visits for anyone 43 and older to request care online for non-urgent symptoms. For details visit [email or web address].   Also download the MyChart app! Go to the app store, search MyChart, open the app, select [place], and log in with your MyChart username and password.

## 2024-03-10 NOTE — Progress Notes (Signed)
 Nutrition Follow-up:  Patient with multiple myeloma. He is receiving kyprolis  + darzalex  q28d (start 04/07/22)   Met with patient in infusion. He is in good spirits and reports doing well. Having some back pain, but manages well with current pain regimen. Patient endorses stable appetite and intake. Says he tried the chicken scampi recipe from cookbook RD provided. This was delicious and easy to prepare. Patient reports trying to cut out some of the sweets. Previously incorporating higher calorie foods due to weight loss. He is more focused on wt maintenance. Patient endorses recent mind shift which has been a great thing for him. Says he is no longer living to die, but is living to live. Patient shares some of the things he is interested in doing.    Medications: reviewed   Labs: reviewed   Anthropometrics: Wt 188 lb today - stable   7/8 - 187 lb 8 oz 6/25 - 186 lb 4 oz 5/28 - 187 lb 12.8 oz 4/28 - 189 lb 12 oz    NUTRITION DIAGNOSIS: Unintentional wt loss - improved    INTERVENTION:  Continue including good sources of lean proteins at every meal Support and encouragement     MONITORING, EVALUATION, GOAL: wt trends, intake    NEXT VISIT: To be scheduled as needed

## 2024-03-14 LAB — MULTIPLE MYELOMA PANEL, SERUM
Albumin SerPl Elph-Mcnc: 4.1 g/dL (ref 2.9–4.4)
Albumin/Glob SerPl: 2.5 — ABNORMAL HIGH (ref 0.7–1.7)
Alpha 1: 0.1 g/dL (ref 0.0–0.4)
Alpha2 Glob SerPl Elph-Mcnc: 0.5 g/dL (ref 0.4–1.0)
B-Globulin SerPl Elph-Mcnc: 0.8 g/dL (ref 0.7–1.3)
Gamma Glob SerPl Elph-Mcnc: 0.3 g/dL — ABNORMAL LOW (ref 0.4–1.8)
Globulin, Total: 1.7 g/dL — ABNORMAL LOW (ref 2.2–3.9)
IgA: 23 mg/dL — ABNORMAL LOW (ref 61–437)
IgG (Immunoglobin G), Serum: 458 mg/dL — ABNORMAL LOW (ref 603–1613)
IgM (Immunoglobulin M), Srm: 11 mg/dL — ABNORMAL LOW (ref 20–172)
Total Protein ELP: 5.8 g/dL — ABNORMAL LOW (ref 6.0–8.5)

## 2024-03-15 ENCOUNTER — Other Ambulatory Visit: Payer: Self-pay

## 2024-03-15 DIAGNOSIS — C9000 Multiple myeloma not having achieved remission: Secondary | ICD-10-CM

## 2024-03-15 MED ORDER — ERGOCALCIFEROL 1.25 MG (50000 UT) PO CAPS: 50000.0000 [IU] | ORAL_CAPSULE | ORAL | 2 refills | Status: DC

## 2024-03-24 ENCOUNTER — Inpatient Hospital Stay (HOSPITAL_BASED_OUTPATIENT_CLINIC_OR_DEPARTMENT_OTHER): Admitting: Hematology

## 2024-03-24 ENCOUNTER — Inpatient Hospital Stay

## 2024-03-24 ENCOUNTER — Inpatient Hospital Stay: Attending: Physician Assistant

## 2024-03-24 VITALS — BP 123/84 | HR 66 | Temp 97.5°F | Resp 20 | Wt 187.2 lb

## 2024-03-24 DIAGNOSIS — Z87891 Personal history of nicotine dependence: Secondary | ICD-10-CM | POA: Diagnosis not present

## 2024-03-24 DIAGNOSIS — Z79624 Long term (current) use of inhibitors of nucleotide synthesis: Secondary | ICD-10-CM | POA: Diagnosis not present

## 2024-03-24 DIAGNOSIS — C9000 Multiple myeloma not having achieved remission: Secondary | ICD-10-CM | POA: Insufficient documentation

## 2024-03-24 DIAGNOSIS — R0602 Shortness of breath: Secondary | ICD-10-CM | POA: Insufficient documentation

## 2024-03-24 DIAGNOSIS — Z7189 Other specified counseling: Secondary | ICD-10-CM

## 2024-03-24 DIAGNOSIS — Z809 Family history of malignant neoplasm, unspecified: Secondary | ICD-10-CM | POA: Insufficient documentation

## 2024-03-24 DIAGNOSIS — Z5112 Encounter for antineoplastic immunotherapy: Secondary | ICD-10-CM | POA: Diagnosis present

## 2024-03-24 DIAGNOSIS — M545 Low back pain, unspecified: Secondary | ICD-10-CM | POA: Diagnosis not present

## 2024-03-24 DIAGNOSIS — R059 Cough, unspecified: Secondary | ICD-10-CM | POA: Insufficient documentation

## 2024-03-24 DIAGNOSIS — Z5111 Encounter for antineoplastic chemotherapy: Secondary | ICD-10-CM

## 2024-03-24 DIAGNOSIS — R972 Elevated prostate specific antigen [PSA]: Secondary | ICD-10-CM | POA: Diagnosis not present

## 2024-03-24 DIAGNOSIS — Z808 Family history of malignant neoplasm of other organs or systems: Secondary | ICD-10-CM | POA: Insufficient documentation

## 2024-03-24 DIAGNOSIS — Z7982 Long term (current) use of aspirin: Secondary | ICD-10-CM | POA: Insufficient documentation

## 2024-03-24 DIAGNOSIS — M25511 Pain in right shoulder: Secondary | ICD-10-CM | POA: Diagnosis not present

## 2024-03-24 DIAGNOSIS — Z7961 Long term (current) use of immunomodulator: Secondary | ICD-10-CM | POA: Insufficient documentation

## 2024-03-24 LAB — CMP (CANCER CENTER ONLY)
ALT: 15 U/L (ref 0–44)
AST: 14 U/L — ABNORMAL LOW (ref 15–41)
Albumin: 4 g/dL (ref 3.5–5.0)
Alkaline Phosphatase: 50 U/L (ref 38–126)
Anion gap: 6 (ref 5–15)
BUN: 13 mg/dL (ref 8–23)
CO2: 24 mmol/L (ref 22–32)
Calcium: 8.7 mg/dL — ABNORMAL LOW (ref 8.9–10.3)
Chloride: 106 mmol/L (ref 98–111)
Creatinine: 0.82 mg/dL (ref 0.61–1.24)
GFR, Estimated: >60 mL/min (ref >60)
Glucose, Bld: 134 mg/dL — ABNORMAL HIGH (ref 70–99)
Potassium: 3.7 mmol/L (ref 3.5–5.1)
Sodium: 136 mmol/L (ref 135–145)
Total Bilirubin: 1 mg/dL (ref 0.0–1.2)
Total Protein: 5.7 g/dL — ABNORMAL LOW (ref 6.5–8.1)

## 2024-03-24 LAB — CBC WITH DIFFERENTIAL (CANCER CENTER ONLY)
Abs Immature Granulocytes: 0.02 K/uL (ref 0.00–0.07)
Basophils Absolute: 0 K/uL (ref 0.0–0.1)
Basophils Relative: 1 %
Eosinophils Absolute: 0.2 K/uL (ref 0.0–0.5)
Eosinophils Relative: 5 %
HCT: 41.4 % (ref 39.0–52.0)
Hemoglobin: 14.4 g/dL (ref 13.0–17.0)
Immature Granulocytes: 1 %
Lymphocytes Relative: 28 %
Lymphs Abs: 0.9 K/uL (ref 0.7–4.0)
MCH: 28.7 pg (ref 26.0–34.0)
MCHC: 34.8 g/dL (ref 30.0–36.0)
MCV: 82.6 fL (ref 80.0–100.0)
Monocytes Absolute: 0.4 K/uL (ref 0.1–1.0)
Monocytes Relative: 13 %
Neutro Abs: 1.8 K/uL (ref 1.7–7.7)
Neutrophils Relative %: 52 %
Platelet Count: 125 K/uL — ABNORMAL LOW (ref 150–400)
RBC: 5.01 MIL/uL (ref 4.22–5.81)
RDW: 15 % (ref 11.5–15.5)
WBC Count: 3.4 K/uL — ABNORMAL LOW (ref 4.0–10.5)
nRBC: 0 % (ref 0.0–0.2)

## 2024-03-24 MED ORDER — SODIUM CHLORIDE 0.9 % IV SOLN
Freq: Once | INTRAVENOUS | Status: AC
Start: 2024-03-24 — End: 2024-03-24

## 2024-03-24 MED ORDER — DEXTROSE 5 % IV SOLN
70.0000 mg/m2 | Freq: Once | INTRAVENOUS | Status: AC
  Administered 2024-03-24: 140 mg via INTRAVENOUS
  Filled 2024-03-24: qty 60

## 2024-03-24 MED ORDER — PALONOSETRON HCL INJECTION 0.25 MG/5ML
0.2500 mg | Freq: Once | INTRAVENOUS | Status: AC
  Administered 2024-03-24: 0.25 mg via INTRAVENOUS
  Filled 2024-03-24: qty 5

## 2024-03-24 MED ORDER — DEXAMETHASONE SODIUM PHOSPHATE 10 MG/ML IJ SOLN
8.0000 mg | Freq: Once | INTRAMUSCULAR | Status: AC
  Administered 2024-03-24: 8 mg via INTRAVENOUS
  Filled 2024-03-24: qty 1

## 2024-03-24 MED ORDER — ZOLEDRONIC ACID 4 MG/100ML IV SOLN
4.0000 mg | Freq: Once | INTRAVENOUS | Status: AC
Start: 2024-03-24 — End: 2024-03-24
  Administered 2024-03-24: 4 mg via INTRAVENOUS
  Filled 2024-03-24: qty 100

## 2024-03-24 NOTE — Patient Instructions (Signed)
 [facility]  Discharge Instructions: Thank you for choosing [facility] to provide your oncology and hematology care.   If you have a lab appointment with the Cancer Center, please go directly to the Cancer Center and check in at the registration area.   Wear comfortable clothing and clothing appropriate for easy access to any Portacath or PICC line.   We strive to give you quality time with your provider. You may need to reschedule your appointment if you arrive late (15 or more minutes).  Arriving late affects you and other patients whose appointments are after yours.  Also, if you miss three or more appointments without notifying the office, you may be dismissed from the clinic at the provider's discretion.      For prescription refill requests, have your pharmacy contact our office and allow 72 hours for refills to be completed.    Today you received the following chemotherapy and/or immunotherapy agents: Kyprolis      To help prevent nausea and vomiting after your treatment, we encourage you to take your nausea medication as directed.  BELOW ARE SYMPTOMS THAT SHOULD BE REPORTED IMMEDIATELY: *FEVER GREATER THAN 100.4 F (38 C) OR HIGHER *CHILLS OR SWEATING *NAUSEA AND VOMITING THAT IS NOT CONTROLLED WITH YOUR NAUSEA MEDICATION *UNUSUAL SHORTNESS OF BREATH *UNUSUAL BRUISING OR BLEEDING *URINARY PROBLEMS (pain or burning when urinating, or frequent urination) *BOWEL PROBLEMS (unusual diarrhea, constipation, pain near the anus) TENDERNESS IN MOUTH AND THROAT WITH OR WITHOUT PRESENCE OF ULCERS (sore throat, sores in mouth, or a toothache) UNUSUAL RASH, SWELLING OR PAIN  UNUSUAL VAGINAL DISCHARGE OR ITCHING   Items with * indicate a potential emergency and should be followed up as soon as possible or go to the Emergency Department if any problems should occur.  Please show the CHEMOTHERAPY ALERT CARD or IMMUNOTHERAPY  ALERT CARD at check-in to the Emergency Department and triage nurse.  Should you have questions after your visit or need to cancel or reschedule your appointment, please contact [facility]  Dept: [phone]  and follow the prompts.  Office hours are 8:00 a.m. to 4:30 p.m. Monday - Friday. Please note that voicemails left after 4:00 p.m. may not be returned until the following business day.  We are closed weekends and major holidays. You have access to a nurse at all times for urgent questions. Please call the main number to the clinic Dept: [phone] and follow the prompts.   For any non-urgent questions, you may also contact your provider using MyChart. We now offer e-Visits for anyone 25 and older to request care online for non-urgent symptoms. For details visit [email or web address].   Also download the MyChart app! Go to the app store, search "MyChart", open the app, select [place], and log in with your MyChart username and password.  Zoledronic Acid Injection (Cancer) What is this medication? ZOLEDRONIC ACID (ZOE le dron ik AS id) treats high calcium levels in the blood caused by cancer. It may also be used with chemotherapy to treat weakened bones caused by cancer. It works by slowing down the release of calcium from bones. This lowers calcium levels in your blood. It also makes your bones stronger and less likely to break (fracture). It belongs to a group of medications called bisphosphonates. This medicine may be used for other purposes; ask your health care provider  or pharmacist if you have questions. COMMON BRAND NAME(S): Zometa, Zometa Powder What should I tell my care team before I take this medication? They need to know if you have any of these conditions: Dehydration Dental disease Kidney disease Liver disease Low levels of calcium in the blood Lung or breathing disease, such as asthma Receiving steroids, such as  dexamethasone or prednisone An unusual or allergic reaction to zoledronic acid, other medications, foods, dyes, or preservatives Pregnant or trying to get pregnant Breast-feeding How should I use this medication? This medication is injected into a vein. It is given by your care team in a hospital or clinic setting. Talk to your care team about the use of this medication in children. Special care may be needed. Overdosage: If you think you have taken too much of this medicine contact a poison control center or emergency room at once. NOTE: This medicine is only for you. Do not share this medicine with others. What if I miss a dose? Keep appointments for follow-up doses. It is important not to miss your dose. Call your care team if you are unable to keep an appointment. What may interact with this medication? Certain antibiotics given by injection Diuretics, such as bumetanide, furosemide NSAIDs, medications for pain and inflammation, such as ibuprofen or naproxen Teriparatide Thalidomide This list may not describe all possible interactions. Give your health care provider a list of all the medicines, herbs, non-prescription drugs, or dietary supplements you use. Also tell them if you smoke, drink alcohol, or use illegal drugs. Some items may interact with your medicine. What should I watch for while using this medication? Visit your care team for regular checks on your progress. It may be some time before you see the benefit from this medication. Some people who take this medication have severe bone, joint, or muscle pain. This medication may also increase your risk for jaw problems or a broken thigh bone. Tell your care team right away if you have severe pain in your jaw, bones, joints, or muscles. Tell you care team if you have any pain that does not go away or that gets worse. Tell your dentist and dental surgeon that you are taking this medication. You should not have major dental surgery  while on this medication. See your dentist to have a dental exam and fix any dental problems before starting this medication. Take good care of your teeth while on this medication. Make sure you see your dentist for regular follow-up appointments. You should make sure you get enough calcium and vitamin D while you are taking this medication. Discuss the foods you eat and the vitamins you take with your care team. Check with your care team if you have severe diarrhea, nausea, and vomiting, or if you sweat a lot. The loss of too much body fluid may make it dangerous for you to take this medication. You may need bloodwork while taking this medication. Talk to your care team if you wish to become pregnant or think you might be pregnant. This medication can cause serious birth defects. What side effects may I notice from receiving this medication? Side effects that you should report to your care team as soon as possible: Allergic reactions--skin rash, itching, hives, swelling of the face, lips, tongue, or throat Kidney injury--decrease in the amount of urine, swelling of the ankles, hands, or feet Low calcium level--muscle pain or cramps, confusion, tingling, or numbness in the hands or feet Osteonecrosis of the jaw--pain, swelling, or redness  in the mouth, numbness of the jaw, poor healing after dental work, unusual discharge from the mouth, visible bones in the mouth Severe bone, joint, or muscle pain Side effects that usually do not require medical attention (report to your care team if they continue or are bothersome): Constipation Fatigue Fever Loss of appetite Nausea Stomach pain This list may not describe all possible side effects. Call your doctor for medical advice about side effects. You may report side effects to FDA at 1-800-FDA-1088. Where should I keep my medication? This medication is given in a hospital or clinic. It will not be stored at home. NOTE: This sheet is a summary. It may  not cover all possible information. If you have questions about this medicine, talk to your doctor, pharmacist, or health care provider.  2024 Elsevier/Gold Standard (2021-09-28 00:00:00)

## 2024-03-25 ENCOUNTER — Other Ambulatory Visit: Payer: Self-pay

## 2024-03-25 DIAGNOSIS — C9000 Multiple myeloma not having achieved remission: Secondary | ICD-10-CM

## 2024-03-25 MED ORDER — REVLIMID 15 MG PO CAPS: ORAL_CAPSULE | ORAL | 0 refills | Status: DC

## 2024-03-26 ENCOUNTER — Other Ambulatory Visit: Payer: Self-pay

## 2024-03-26 ENCOUNTER — Encounter: Payer: Self-pay | Admitting: Hematology

## 2024-03-29 ENCOUNTER — Other Ambulatory Visit: Payer: Self-pay

## 2024-03-29 DIAGNOSIS — C9000 Multiple myeloma not having achieved remission: Secondary | ICD-10-CM

## 2024-03-30 ENCOUNTER — Other Ambulatory Visit: Payer: Self-pay

## 2024-03-30 ENCOUNTER — Encounter: Payer: Self-pay | Admitting: Hematology

## 2024-03-30 DIAGNOSIS — C9000 Multiple myeloma not having achieved remission: Secondary | ICD-10-CM

## 2024-03-30 MED ORDER — FENTANYL 25 MCG/HR TD PT72: 1.0000 | MEDICATED_PATCH | TRANSDERMAL | 0 refills | Status: DC

## 2024-03-30 MED ORDER — FENTANYL 25 MCG/HR TD PT72
1.0000 | MEDICATED_PATCH | TRANSDERMAL | 0 refills | Status: DC
Start: 2024-03-30 — End: 2024-04-16

## 2024-03-30 NOTE — Progress Notes (Signed)
 HEMATOLOGY/ONCOLOGY CLINIC NOTE  Date of Service: .03/24/2024   Patient Care Team: Redmon, Noelle, PA as PCP - General (Nurse Practitioner)  CHIEF COMPLAINTS/PURPOSE OF CONSULTATION:  Follow-up for continued evaluation and management of multiple myeloma  HISTORY OF PRESENTING ILLNESS:   Anthony [name] is a wonderful 63 y.o. male who has been referred to us  by Dr Alvera Reagin, PA for evaluation and management of newly diagnosed multiple myeloma. He reports He is doing well.  He reports persistent lower back pain that he has had since he was in his 20's. He notes no previous significant injuries. He further notes that he gets chiropractic adjustments and takes Tylenol  to manage his symptoms. He notes less back pain when standing up on his right leg and maintaining weight on his right leg.  He reports previous history of smoking and chewing tobacco over 40 years ago.  He had a recent fall back in February of this year with only a pulled muscle. And he notes another fall when chasing his cat where he fell on his right shoulder. He reports pain in right shoulder.   He had a recent COVID-19 infection back in March.  He reports intermittent cough with SOB. He notes he takes 2 Rolaids at night. We discussed potentially trying antacids which he is agreeable to as he will begin steroids soon and was advised of the possible symptoms and side effects from taking steroids.  We discussed CRAB criteria and that he meets at least two of the criterion being  anemia and bone disease.  We discussed getting additional scans for further evaluation which he is agreeable to.  We further discussed starting Daratumumab /Velcade /Dexamethasone /Zometa  and Revlimid  for treatment which he is agreeable to. We also discussed starting steroids before treatment and taking an acid suppressant which he was also agreeable to.  We discussed getting Senna and taking it as needed for constipation.  Labs done today  were reviewed in detail.  We discussed his recent bone marrow biopsy and aspiration done 02/06/2022.  We discussed PET/CT scan done 01/28/2022.  We discussed CT right shoulder w/o contrast done 11/13/2021  INTERVAL HISTORY:  Anthony [name] is a 63 y.o. male here for continued valuation and management of close high risk multiple myeloma.  He notes that he is tolerating his carfilzomib  Revlimid  maintenance without any acute new toxicities.  He notes that his back pain has gotten worse since he had been on his fentanyl  patch and he would like to go back to his previous dose of the patch at 25 mcg/h.  No other acute new symptoms. No fevers or chills or night sweats.  No infection issues.  No other notable drug toxicities.    MEDICAL HISTORY:  Past Medical History:  Diagnosis Date   Back pain    Cancer (HCC)    Multiple myeloma (HCC) 02/15/2022    SURGICAL HISTORY: Past Surgical History:  Procedure Laterality Date   IR BONE TUMOR(S)RF ABLATION  05/07/2022   IR BONE TUMOR(S)RF ABLATION  05/07/2022   IR BONE TUMOR(S)RF ABLATION  05/07/2022   IR KYPHO EA ADDL LEVEL THORACIC OR LUMBAR  05/07/2022   IR KYPHO LUMBAR INC FX REDUCE BONE BX UNI/BIL CANNULATION INC/IMAGING  05/07/2022   IR KYPHO THORACIC WITH BONE BIOPSY  05/07/2022   IR RADIOLOGIST EVAL & MGMT  04/23/2022   IR RADIOLOGIST EVAL & MGMT  05/17/2022   IR RADIOLOGIST EVAL & MGMT  06/06/2022    SOCIAL HISTORY: Social History   Socioeconomic  History   Marital status: Married    Spouse name: Not on file   Number of children: Not on file   Years of education: Not on file   Highest education level: Not on file  Occupational History   Not on file  Tobacco Use   Smoking status: Never   Smokeless tobacco: Former    Types: Chew, Snuff  Vaping Use   Vaping status: Never Used  Substance and Sexual Activity   Alcohol use: Yes    Alcohol/week: 2.0 standard drinks of alcohol    Types: 2 Glasses of wine per week   Drug use: No    Sexual activity: Yes  Other Topics Concern   Not on file  Social History Narrative   Not on file   Social Drivers of Health   Financial Resource Strain: Low Risk  (05/07/2022)   Overall Financial Resource Strain (CARDIA)    Difficulty of Paying Living Expenses: Not very hard  Food Insecurity: No Food Insecurity (05/07/2022)   Hunger Vital Sign    Worried About Running Out of Food in the Last Year: Never true    Ran Out of Food in the Last Year: Never true  Transportation Needs: No Transportation Needs (05/07/2022)   PRAPARE - Administrator, Civil Service (Medical): No    Lack of Transportation (Non-Medical): No  Physical Activity: Not on file  Stress: Not on file  Social Connections: Not on file  Intimate Partner Violence: Unknown (05/07/2022)   Humiliation, Afraid, Rape, and Kick questionnaire    Fear of Current or Ex-Partner: No    Emotionally Abused: No    Physically Abused: No    Sexually Abused: Not on file    FAMILY HISTORY: Family History  Problem Relation Age of Onset   Rheum arthritis Mother    Other Sister        Pre-cancerous uterine mass;    Rheum arthritis Sister    Bone cancer Paternal Grandfather    Brain cancer Maternal Uncle    Brain cancer Maternal Aunt    Osteoporosis Neg Hx     ALLERGIES:  has no known allergies.  MEDICATIONS:  Current Outpatient Medications  Medication Sig Dispense Refill   acyclovir  (ZOVIRAX ) 400 MG tablet Take 1 tablet (400 mg total) by mouth 2 (two) times daily. 10 tablet 0   aluminum chloride (DRYSOL) 20 % external solution Apply 1 application topically at bedtime.     apixaban  (ELIQUIS ) 5 MG TABS tablet Take 1 tablet (5 mg total) by mouth 2 (two) times daily. 10 tablet 0   calcium-vitamin D  (OSCAL WITH D) 250-125 MG-UNIT tablet Take 1 tablet by mouth daily.     cyanocobalamin  (VITAMIN B12) 1000 MCG tablet Take 1 tablet (1,000 mcg total) by mouth daily. 30 tablet 3   diclofenac sodium (VOLTAREN) 1 % GEL Apply 2 g  topically as needed.     ergocalciferol  (VITAMIN D2) 1.25 MG (50000 UT) capsule Take 1 capsule (50,000 Units total) by mouth 2 (two) times a week. 12 capsule 2   esomeprazole  (NEXIUM ) 40 MG capsule Take 1 capsule (40 mg total) by mouth daily. Take 1 capsule (40 mg total) daily before breakfast 30 capsule 3   famotidine  (PEPCID ) 40 MG tablet Take 40 mg by mouth daily.     lidocaine  (LIDODERM ) 5 % Place 1 patch onto the skin daily. Remove & Discard patch within 12 hours or as directed by MD 30 patch 0   loratadine (CLARITIN) 10 MG  tablet Take 10 mg by mouth daily.     LORazepam  (ATIVAN ) 0.5 MG tablet Take 1 tablet (0.5 mg total) by mouth every 6 (six) hours as needed (Nausea or vomiting). 30 tablet 0   losartan (COZAAR) 50 MG tablet Take 25 mg by mouth.     methocarbamol  (ROBAXIN ) 500 MG tablet Take 1 tablet (500 mg total) by mouth every 8 (eight) hours as needed for muscle spasms. 30 tablet 0   metroNIDAZOLE  (METROGEL ) 1 % gel Apply topically daily. 45 g 0   Multiple Vitamin (MULTIVITAMIN) tablet Take 1 tablet by mouth daily.     Omega-3 Fatty Acids (FISH OIL) 1360 MG CAPS Take 1 capsule by mouth daily.     ondansetron  (ZOFRAN ) 8 MG tablet Take 1 tablet (8 mg total) by mouth every 8 (eight) hours as needed for nausea or vomiting. 30 tablet 0   oxyCODONE  (ROXICODONE ) 5 MG immediate release tablet Take 1 tablet (5 mg total) by mouth every 4 (four) hours as needed for severe pain (pain score 7-10) or moderate pain (pain score 4-6). Take 1 to 2 tablets every 4 hours as needed for moderate to severe pain. 60 tablet 0   prochlorperazine  (COMPAZINE ) 10 MG tablet TAKE ONE TABLET BY MOUTH EVERY 6 HOURS AS NEEDED FOR NAUSEA AND VOMITING 30 tablet 1   terbinafine (LAMISIL) 1 % cream Apply 1 application topically 2 (two) times daily.     cyclobenzaprine (FLEXERIL) 10 MG tablet Take 10 mg by mouth 3 (three) times daily as needed for muscle spasms. (Patient not taking: Reported on 03/24/2024)     dronabinol   (MARINOL ) 10 MG capsule Take 1 capsule (10 mg total) by mouth 2 (two) times daily before a meal. (Patient not taking: Reported on 03/24/2024) 60 capsule 0   escitalopram (LEXAPRO) 10 MG tablet Take 10 mg by mouth daily. (Patient not taking: Reported on 03/24/2024)     fentaNYL  (DURAGESIC ) 25 MCG/HR Place 1 patch onto the skin every 3 (three) days. 5 patch 0   REVLIMID  15 MG capsule TAKE 1 CAPSULE DAILY FOR 21 DAYS ON, THEN 7 DAYS OFF 21 capsule 0   senna-docusate (SENNA S) 8.6-50 MG tablet Take 2 tablets by mouth at bedtime. (Patient not taking: Reported on 03/24/2024) 60 tablet 1   No current facility-administered medications for this visit.   REVIEW OF SYSTEMS:  .10 Point review of Systems was done is negative except as noted above.  PHYSICAL EXAMINATION: .BP 123/84   Pulse 66   Temp (!) 97.5 F (36.4 C)   Resp 20   Wt 187 lb 3.2 oz (84.9 kg)   SpO2 98%   BMI 33.16 kg/m  . GENERAL:alert, in no acute distress and comfortable SKIN: no acute rashes, no significant lesions EYES: conjunctiva are pink and non-injected, sclera anicteric OROPHARYNX: MMM, no exudates, no oropharyngeal erythema or ulceration NECK: supple, no JVD LYMPH:  no palpable lymphadenopathy in the cervical, axillary or inguinal regions LUNGS: clear to auscultation b/l with normal respiratory effort HEART: regular rate & rhythm ABDOMEN:  normoactive bowel sounds , non tender, not distended.  No palpable hepatosplenomegaly Extremity: no pedal edema PSYCH: alert & oriented x 3 with fluent speech NEURO: no focal motor/sensory deficits   LABORATORY DATA:  I have reviewed the data as listed .    Latest Ref Rng & Units 03/24/2024   12:09 PM 03/10/2024    2:16 PM 02/24/2024   10:18 AM  CBC  WBC 4.0 - 10.5 K/uL 3.4  4.0  3.4   Hemoglobin 13.0 - 17.0 g/dL 85.5  85.5  85.1   Hematocrit 39.0 - 52.0 % 41.4  42.0  43.3   Platelets 150 - 400 K/uL 125  226  117       Latest Ref Rng & Units 03/24/2024   12:09 PM 03/10/2024     2:16 PM 02/24/2024   10:18 AM  CMP  Glucose 70 - 99 mg/dL 865  94  85   BUN 8 - 23 mg/dL 13  16  11    Creatinine 0.61 - 1.24 mg/dL 9.17  9.06  9.17   Sodium 135 - 145 mmol/L 136  136  137   Potassium 3.5 - 5.1 mmol/L 3.7  4.0  4.1   Chloride 98 - 111 mmol/L 106  105  108   CO2 22 - 32 mmol/L 24  24  24    Calcium 8.9 - 10.3 mg/dL 8.7  8.9  8.8   Total Protein 6.5 - 8.1 g/dL 5.7  6.1  5.7   Total Bilirubin 0.0 - 1.2 mg/dL 1.0  0.6  1.0   Alkaline Phos 38 - 126 U/L 50  51  44   AST 15 - 41 U/L 14  12  12    ALT 0 - 44 U/L 15  11  12       PATHOLOGY Surgical Pathology CASE: [id number] PATIENT: Anthony [name] Bone Marrow Report     Clinical History: Multiple Myeloma     DIAGNOSIS:  BONE MARROW, ASPIRATE, CLOT, CORE: -Hypercellular bone marrow (60%) with erythroid predominant trilineage hematopoiesis and no evidence of residual plasma cell neoplasm (less than 1% plasma cells by manual aspirate differential, and CD138 immunohistochemical analysis of clot and core sections).  PERIPHERAL BLOOD: -Leukopenia  MICROSCOPIC DESCRIPTION:  PERIPHERAL BLOOD SMEAR: Platelets: Adequate in number, no platelet clumps identified Erythroid: Normocytic normochromic red blood cells Leukocytes: Mild leukopenia, negative for dysplastic granulocytes, blasts or  plasma cells  BONE MARROW ASPIRATE: Cellular Erythroid precursors: Relatively increased, show a full sequence of maturation with occasional abnormal forms (including binucleate forms, megaloblastoid change and nuclear membrane irregularities) Granulocytic precursors: Decreased, show a full sequence of generally orderly maturation Megakaryocytes: Normal in number and morphology Lymphocytes/plasma cells: Not increased   02/06/2022 Molecular pathology   RADIOGRAPHIC STUDIES: I have personally reviewed the radiological images as listed and agreed with the findings in the report. No results found.   ASSESSMENT & PLAN:   64  y.o. very pleasant male with  1. R-ISS stage III multiple myeloma with extensive bone metastases and patholgiic fracture rt shoulder -Recent bone marrow biopsy and aspiration done 02/06/2022 revealed hypercellular bone marrow with plasma cell neoplasm. Cytogenetics-normal karyotype Molecular cytogenetics-TP53 deletion and duplication of 1 q.SABRA  PLAN:  -Patient labs from today were discussed in detail with him and his previous myeloma labs which on 03/10/2024 showed no measurable M spike No notable new toxicities from his treatment Will continue carfilzomib  Revlimid  maintenance at current doses. Continue the same supportive medications including daily aspirin and acyclovir . Will increase his fentanyl  patch from 12 mcg to 25 mcg as previously optimize control of his back pain. He was recommended to stay up to speed with his age-appropriate vaccinations with his primary care physician. He is following up with his urologist for management of his prostate symptoms and elevated PSA. -recommend patient to stay well-hydrated -continue 1,000 mcg daily vitamin B12  -continue B complex supplements. -continue ergocalciferol  50K units once a week  FOLLOW-UP: Per integrated scheduling MD visit in  6-8 weeks The total time spent in the appointment was 30 minutes*.  All of the patient's questions were answered with apparent satisfaction. The patient knows to call the clinic with any problems, questions or concerns.   Emaline Saran MD MS AAHIVMS [facility] Hematology/Oncology Physician [facility]  .*Total Encounter Time as defined by the Centers for Medicare and Medicaid Services includes, in addition to the face-to-face time of a patient visit (documented in the note above) non-face-to-face time: obtaining and reviewing outside history, ordering and reviewing medications, tests or procedures, care coordination (communications with other health care professionals or caregivers) and  documentation in the medical record.

## 2024-04-02 ENCOUNTER — Other Ambulatory Visit: Payer: Self-pay

## 2024-04-07 ENCOUNTER — Inpatient Hospital Stay

## 2024-04-07 VITALS — BP 140/80 | HR 62 | Temp 98.9°F | Resp 16 | Wt 186.2 lb

## 2024-04-07 DIAGNOSIS — C9000 Multiple myeloma not having achieved remission: Secondary | ICD-10-CM

## 2024-04-07 DIAGNOSIS — Z7189 Other specified counseling: Secondary | ICD-10-CM

## 2024-04-07 DIAGNOSIS — Z5112 Encounter for antineoplastic immunotherapy: Secondary | ICD-10-CM | POA: Diagnosis not present

## 2024-04-07 LAB — CBC WITH DIFFERENTIAL (CANCER CENTER ONLY)
Abs Immature Granulocytes: 0.02 K/uL (ref 0.00–0.07)
Basophils Absolute: 0 K/uL (ref 0.0–0.1)
Basophils Relative: 1 %
Eosinophils Absolute: 0.1 K/uL (ref 0.0–0.5)
Eosinophils Relative: 4 %
HCT: 42.1 % (ref 39.0–52.0)
Hemoglobin: 14.3 g/dL (ref 13.0–17.0)
Immature Granulocytes: 1 %
Lymphocytes Relative: 25 %
Lymphs Abs: 0.9 K/uL (ref 0.7–4.0)
MCH: 28.6 pg (ref 26.0–34.0)
MCHC: 34 g/dL (ref 30.0–36.0)
MCV: 84.2 fL (ref 80.0–100.0)
Monocytes Absolute: 0.4 K/uL (ref 0.1–1.0)
Monocytes Relative: 10 %
Neutro Abs: 2.2 K/uL (ref 1.7–7.7)
Neutrophils Relative %: 59 %
Platelet Count: 234 K/uL (ref 150–400)
RBC: 5 MIL/uL (ref 4.22–5.81)
RDW: 14.8 % (ref 11.5–15.5)
WBC Count: 3.7 K/uL — ABNORMAL LOW (ref 4.0–10.5)
nRBC: 0 % (ref 0.0–0.2)

## 2024-04-07 LAB — CMP (CANCER CENTER ONLY)
ALT: 10 U/L (ref 0–44)
AST: 12 U/L — ABNORMAL LOW (ref 15–41)
Albumin: 4.3 g/dL (ref 3.5–5.0)
Alkaline Phosphatase: 54 U/L (ref 38–126)
Anion gap: 6 (ref 5–15)
BUN: 11 mg/dL (ref 8–23)
CO2: 24 mmol/L (ref 22–32)
Calcium: 8.8 mg/dL — ABNORMAL LOW (ref 8.9–10.3)
Chloride: 106 mmol/L (ref 98–111)
Creatinine: 0.71 mg/dL (ref 0.61–1.24)
GFR, Estimated: >60 mL/min (ref >60)
Glucose, Bld: 87 mg/dL (ref 70–99)
Potassium: 4.1 mmol/L (ref 3.5–5.1)
Sodium: 136 mmol/L (ref 135–145)
Total Bilirubin: 0.7 mg/dL (ref 0.0–1.2)
Total Protein: 6.2 g/dL — ABNORMAL LOW (ref 6.5–8.1)

## 2024-04-07 MED ORDER — SODIUM CHLORIDE 0.9 % IV SOLN: Freq: Once | INTRAVENOUS | Status: AC

## 2024-04-07 MED ORDER — PALONOSETRON HCL INJECTION 0.25 MG/5ML
0.2500 mg | Freq: Once | INTRAVENOUS | Status: AC
  Administered 2024-04-07: 0.25 mg via INTRAVENOUS
  Filled 2024-04-07: qty 5

## 2024-04-07 MED ORDER — DEXTROSE 5 % IV SOLN
70.0000 mg/m2 | Freq: Once | INTRAVENOUS | Status: AC
  Administered 2024-04-07: 140 mg via INTRAVENOUS
  Filled 2024-04-07: qty 60

## 2024-04-07 MED ORDER — ACETAMINOPHEN 325 MG PO TABS
650.0000 mg | ORAL_TABLET | Freq: Once | ORAL | Status: AC
  Administered 2024-04-07: 650 mg via ORAL
  Filled 2024-04-07: qty 2

## 2024-04-07 MED ORDER — DEXAMETHASONE SODIUM PHOSPHATE 10 MG/ML IJ SOLN
8.0000 mg | Freq: Once | INTRAMUSCULAR | Status: AC
  Administered 2024-04-07: 8 mg via INTRAVENOUS
  Filled 2024-04-07: qty 1

## 2024-04-07 NOTE — Patient Instructions (Signed)
 [facility]  Discharge Instructions: Thank you for choosing [facility] to provide your oncology and hematology care.   If you have a lab appointment with the Cancer Center, please go directly to the Cancer Center and check in at the registration area.   Wear comfortable clothing and clothing appropriate for easy access to any Portacath or PICC line.   We strive to give you quality time with your provider. You may need to reschedule your appointment if you arrive late (15 or more minutes).  Arriving late affects you and other patients whose appointments are after yours.  Also, if you miss three or more appointments without notifying the office, you may be dismissed from the clinic at the provider's discretion.      For prescription refill requests, have your pharmacy contact our office and allow 72 hours for refills to be completed.    Today you received the following chemotherapy and/or immunotherapy agents: Kyprolis      To help prevent nausea and vomiting after your treatment, we encourage you to take your nausea medication as directed.  BELOW ARE SYMPTOMS THAT SHOULD BE REPORTED IMMEDIATELY: *FEVER GREATER THAN 100.4 F (38 C) OR HIGHER *CHILLS OR SWEATING *NAUSEA AND VOMITING THAT IS NOT CONTROLLED WITH YOUR NAUSEA MEDICATION *UNUSUAL SHORTNESS OF BREATH *UNUSUAL BRUISING OR BLEEDING *URINARY PROBLEMS (pain or burning when urinating, or frequent urination) *BOWEL PROBLEMS (unusual diarrhea, constipation, pain near the anus) TENDERNESS IN MOUTH AND THROAT WITH OR WITHOUT PRESENCE OF ULCERS (sore throat, sores in mouth, or a toothache) UNUSUAL RASH, SWELLING OR PAIN  UNUSUAL VAGINAL DISCHARGE OR ITCHING   Items with * indicate a potential emergency and should be followed up as soon as possible or go to the Emergency Department if any problems should occur.  Please show the CHEMOTHERAPY ALERT CARD or IMMUNOTHERAPY  ALERT CARD at check-in to the Emergency Department and triage nurse.  Should you have questions after your visit or need to cancel or reschedule your appointment, please contact [facility]  Dept: [phone]  and follow the prompts.  Office hours are 8:00 a.m. to 4:30 p.m. Monday - Friday. Please note that voicemails left after 4:00 p.m. may not be returned until the following business day.  We are closed weekends and major holidays. You have access to a nurse at all times for urgent questions. Please call the main number to the clinic Dept: [phone] and follow the prompts.   For any non-urgent questions, you may also contact your provider using MyChart. We now offer e-Visits for anyone 25 and older to request care online for non-urgent symptoms. For details visit [email or web address].   Also download the MyChart app! Go to the app store, search "MyChart", open the app, select [place], and log in with your MyChart username and password.  Zoledronic Acid Injection (Cancer) What is this medication? ZOLEDRONIC ACID (ZOE le dron ik AS id) treats high calcium levels in the blood caused by cancer. It may also be used with chemotherapy to treat weakened bones caused by cancer. It works by slowing down the release of calcium from bones. This lowers calcium levels in your blood. It also makes your bones stronger and less likely to break (fracture). It belongs to a group of medications called bisphosphonates. This medicine may be used for other purposes; ask your health care provider  or pharmacist if you have questions. COMMON BRAND NAME(S): Zometa, Zometa Powder What should I tell my care team before I take this medication? They need to know if you have any of these conditions: Dehydration Dental disease Kidney disease Liver disease Low levels of calcium in the blood Lung or breathing disease, such as asthma Receiving steroids, such as  dexamethasone or prednisone An unusual or allergic reaction to zoledronic acid, other medications, foods, dyes, or preservatives Pregnant or trying to get pregnant Breast-feeding How should I use this medication? This medication is injected into a vein. It is given by your care team in a hospital or clinic setting. Talk to your care team about the use of this medication in children. Special care may be needed. Overdosage: If you think you have taken too much of this medicine contact a poison control center or emergency room at once. NOTE: This medicine is only for you. Do not share this medicine with others. What if I miss a dose? Keep appointments for follow-up doses. It is important not to miss your dose. Call your care team if you are unable to keep an appointment. What may interact with this medication? Certain antibiotics given by injection Diuretics, such as bumetanide, furosemide NSAIDs, medications for pain and inflammation, such as ibuprofen or naproxen Teriparatide Thalidomide This list may not describe all possible interactions. Give your health care provider a list of all the medicines, herbs, non-prescription drugs, or dietary supplements you use. Also tell them if you smoke, drink alcohol, or use illegal drugs. Some items may interact with your medicine. What should I watch for while using this medication? Visit your care team for regular checks on your progress. It may be some time before you see the benefit from this medication. Some people who take this medication have severe bone, joint, or muscle pain. This medication may also increase your risk for jaw problems or a broken thigh bone. Tell your care team right away if you have severe pain in your jaw, bones, joints, or muscles. Tell you care team if you have any pain that does not go away or that gets worse. Tell your dentist and dental surgeon that you are taking this medication. You should not have major dental surgery  while on this medication. See your dentist to have a dental exam and fix any dental problems before starting this medication. Take good care of your teeth while on this medication. Make sure you see your dentist for regular follow-up appointments. You should make sure you get enough calcium and vitamin D while you are taking this medication. Discuss the foods you eat and the vitamins you take with your care team. Check with your care team if you have severe diarrhea, nausea, and vomiting, or if you sweat a lot. The loss of too much body fluid may make it dangerous for you to take this medication. You may need bloodwork while taking this medication. Talk to your care team if you wish to become pregnant or think you might be pregnant. This medication can cause serious birth defects. What side effects may I notice from receiving this medication? Side effects that you should report to your care team as soon as possible: Allergic reactions--skin rash, itching, hives, swelling of the face, lips, tongue, or throat Kidney injury--decrease in the amount of urine, swelling of the ankles, hands, or feet Low calcium level--muscle pain or cramps, confusion, tingling, or numbness in the hands or feet Osteonecrosis of the jaw--pain, swelling, or redness  in the mouth, numbness of the jaw, poor healing after dental work, unusual discharge from the mouth, visible bones in the mouth Severe bone, joint, or muscle pain Side effects that usually do not require medical attention (report to your care team if they continue or are bothersome): Constipation Fatigue Fever Loss of appetite Nausea Stomach pain This list may not describe all possible side effects. Call your doctor for medical advice about side effects. You may report side effects to FDA at 1-800-FDA-1088. Where should I keep my medication? This medication is given in a hospital or clinic. It will not be stored at home. NOTE: This sheet is a summary. It may  not cover all possible information. If you have questions about this medicine, talk to your doctor, pharmacist, or health care provider.  2024 Elsevier/Gold Standard (2021-09-28 00:00:00)

## 2024-04-12 LAB — MULTIPLE MYELOMA PANEL, SERUM
Albumin SerPl Elph-Mcnc: 3.7 g/dL (ref 2.9–4.4)
Albumin/Glob SerPl: 1.7 (ref 0.7–1.7)
Alpha 1: 0.3 g/dL (ref 0.0–0.4)
Alpha2 Glob SerPl Elph-Mcnc: 0.6 g/dL (ref 0.4–1.0)
B-Globulin SerPl Elph-Mcnc: 0.9 g/dL (ref 0.7–1.3)
Gamma Glob SerPl Elph-Mcnc: 0.4 g/dL (ref 0.4–1.8)
Globulin, Total: 2.2 g/dL (ref 2.2–3.9)
IgA: 23 mg/dL — ABNORMAL LOW (ref 61–437)
IgG (Immunoglobin G), Serum: 502 mg/dL — ABNORMAL LOW (ref 603–1613)
IgM (Immunoglobulin M), Srm: 15 mg/dL — ABNORMAL LOW (ref 20–172)
Total Protein ELP: 5.9 g/dL — ABNORMAL LOW (ref 6.0–8.5)

## 2024-04-16 ENCOUNTER — Other Ambulatory Visit: Payer: Self-pay

## 2024-04-16 DIAGNOSIS — C9000 Multiple myeloma not having achieved remission: Secondary | ICD-10-CM

## 2024-04-21 ENCOUNTER — Encounter: Payer: Self-pay | Admitting: Hematology

## 2024-04-21 MED ORDER — FENTANYL 25 MCG/HR TD PT72
1.0000 | MEDICATED_PATCH | TRANSDERMAL | 0 refills | Status: DC
Start: 2024-04-21 — End: 2024-05-20

## 2024-04-22 ENCOUNTER — Other Ambulatory Visit: Payer: Self-pay

## 2024-04-22 DIAGNOSIS — C9000 Multiple myeloma not having achieved remission: Secondary | ICD-10-CM

## 2024-04-23 ENCOUNTER — Inpatient Hospital Stay: Attending: Physician Assistant

## 2024-04-23 ENCOUNTER — Inpatient Hospital Stay

## 2024-04-23 ENCOUNTER — Other Ambulatory Visit: Payer: Self-pay

## 2024-04-23 ENCOUNTER — Inpatient Hospital Stay (HOSPITAL_BASED_OUTPATIENT_CLINIC_OR_DEPARTMENT_OTHER): Admitting: Hematology

## 2024-04-23 VITALS — BP 132/88 | HR 64 | Temp 97.9°F | Resp 20 | Wt 185.5 lb

## 2024-04-23 DIAGNOSIS — Z5112 Encounter for antineoplastic immunotherapy: Secondary | ICD-10-CM | POA: Diagnosis present

## 2024-04-23 DIAGNOSIS — Z87891 Personal history of nicotine dependence: Secondary | ICD-10-CM | POA: Diagnosis not present

## 2024-04-23 DIAGNOSIS — R0602 Shortness of breath: Secondary | ICD-10-CM | POA: Insufficient documentation

## 2024-04-23 DIAGNOSIS — Z7901 Long term (current) use of anticoagulants: Secondary | ICD-10-CM | POA: Insufficient documentation

## 2024-04-23 DIAGNOSIS — Z7189 Other specified counseling: Secondary | ICD-10-CM

## 2024-04-23 DIAGNOSIS — C9000 Multiple myeloma not having achieved remission: Secondary | ICD-10-CM | POA: Insufficient documentation

## 2024-04-23 DIAGNOSIS — Z809 Family history of malignant neoplasm, unspecified: Secondary | ICD-10-CM | POA: Diagnosis not present

## 2024-04-23 DIAGNOSIS — M545 Low back pain, unspecified: Secondary | ICD-10-CM | POA: Insufficient documentation

## 2024-04-23 DIAGNOSIS — R059 Cough, unspecified: Secondary | ICD-10-CM | POA: Diagnosis not present

## 2024-04-23 DIAGNOSIS — R972 Elevated prostate specific antigen [PSA]: Secondary | ICD-10-CM | POA: Insufficient documentation

## 2024-04-23 DIAGNOSIS — Z7982 Long term (current) use of aspirin: Secondary | ICD-10-CM | POA: Diagnosis not present

## 2024-04-23 DIAGNOSIS — Z79624 Long term (current) use of inhibitors of nucleotide synthesis: Secondary | ICD-10-CM | POA: Insufficient documentation

## 2024-04-23 DIAGNOSIS — Z808 Family history of malignant neoplasm of other organs or systems: Secondary | ICD-10-CM | POA: Diagnosis not present

## 2024-04-23 LAB — CMP (CANCER CENTER ONLY)
ALT: 18 U/L (ref 0–44)
AST: 14 U/L — ABNORMAL LOW (ref 15–41)
Albumin: 4.3 g/dL (ref 3.5–5.0)
Alkaline Phosphatase: 53 U/L (ref 38–126)
Anion gap: 6 (ref 5–15)
BUN: 13 mg/dL (ref 8–23)
CO2: 24 mmol/L (ref 22–32)
Calcium: 9 mg/dL (ref 8.9–10.3)
Chloride: 108 mmol/L (ref 98–111)
Creatinine: 0.78 mg/dL (ref 0.61–1.24)
GFR, Estimated: >60 mL/min
Glucose, Bld: 90 mg/dL (ref 70–99)
Potassium: 4 mmol/L (ref 3.5–5.1)
Sodium: 138 mmol/L (ref 135–145)
Total Bilirubin: 0.8 mg/dL (ref 0.0–1.2)
Total Protein: 6.2 g/dL — ABNORMAL LOW (ref 6.5–8.1)

## 2024-04-23 LAB — CBC WITH DIFFERENTIAL (CANCER CENTER ONLY)
Abs Immature Granulocytes: 0.02 K/uL (ref 0.00–0.07)
Basophils Absolute: 0 K/uL (ref 0.0–0.1)
Basophils Relative: 1 %
Eosinophils Absolute: 0.2 K/uL (ref 0.0–0.5)
Eosinophils Relative: 5 %
HCT: 44.6 % (ref 39.0–52.0)
Hemoglobin: 15.2 g/dL (ref 13.0–17.0)
Immature Granulocytes: 1 %
Lymphocytes Relative: 31 %
Lymphs Abs: 1 K/uL (ref 0.7–4.0)
MCH: 29.1 pg (ref 26.0–34.0)
MCHC: 34.1 g/dL (ref 30.0–36.0)
MCV: 85.4 fL (ref 80.0–100.0)
Monocytes Absolute: 0.6 K/uL (ref 0.1–1.0)
Monocytes Relative: 19 %
Neutro Abs: 1.4 K/uL — ABNORMAL LOW (ref 1.7–7.7)
Neutrophils Relative %: 43 %
Platelet Count: 134 K/uL — ABNORMAL LOW (ref 150–400)
RBC: 5.22 MIL/uL (ref 4.22–5.81)
RDW: 14.6 % (ref 11.5–15.5)
WBC Count: 3.3 K/uL — ABNORMAL LOW (ref 4.0–10.5)
nRBC: 0 % (ref 0.0–0.2)

## 2024-04-23 MED ORDER — REVLIMID 15 MG PO CAPS: ORAL_CAPSULE | ORAL | 0 refills | Status: DC

## 2024-04-23 MED ORDER — ZOLEDRONIC ACID 4 MG/100ML IV SOLN
4.0000 mg | Freq: Once | INTRAVENOUS | Status: AC
  Administered 2024-04-23: 4 mg via INTRAVENOUS
  Filled 2024-04-23: qty 100

## 2024-04-23 MED ORDER — DEXTROSE 5 % IV SOLN
70.0000 mg/m2 | Freq: Once | INTRAVENOUS | Status: AC
  Administered 2024-04-23: 140 mg via INTRAVENOUS
  Filled 2024-04-23: qty 60

## 2024-04-23 MED ORDER — DEXAMETHASONE SODIUM PHOSPHATE 10 MG/ML IJ SOLN
8.0000 mg | Freq: Once | INTRAMUSCULAR | Status: AC
  Administered 2024-04-23: 8 mg via INTRAVENOUS
  Filled 2024-04-23: qty 1

## 2024-04-23 MED ORDER — PALONOSETRON HCL INJECTION 0.25 MG/5ML
0.2500 mg | Freq: Once | INTRAVENOUS | Status: AC
  Administered 2024-04-23: 0.25 mg via INTRAVENOUS
  Filled 2024-04-23: qty 5

## 2024-04-23 MED ORDER — SODIUM CHLORIDE 0.9 % IV SOLN: Freq: Once | INTRAVENOUS | Status: AC

## 2024-04-23 NOTE — Patient Instructions (Signed)
 [facility] - A DEPT OF [facility]  Discharge Instructions: Thank you for choosing [facility] to provide your oncology and hematology care.   If you have a lab appointment with the Cancer Center, please go directly to the Cancer Center and check in at the registration area.   Wear comfortable clothing and clothing appropriate for easy access to any Portacath or PICC line.   We strive to give you quality time with your provider. You may need to reschedule your appointment if you arrive late (15 or more minutes).  Arriving late affects you and other patients whose appointments are after yours.  Also, if you miss three or more appointments without notifying the office, you may be dismissed from the clinic at the provider's discretion.      For prescription refill requests, have your pharmacy contact our office and allow 72 hours for refills to be completed.    Today you received the following chemotherapy and/or immunotherapy agents: carfilzomib      To help prevent nausea and vomiting after your treatment, we encourage you to take your nausea medication as directed.  BELOW ARE SYMPTOMS THAT SHOULD BE REPORTED IMMEDIATELY: *FEVER GREATER THAN 100.4 F (38 C) OR HIGHER *CHILLS OR SWEATING *NAUSEA AND VOMITING THAT IS NOT CONTROLLED WITH YOUR NAUSEA MEDICATION *UNUSUAL SHORTNESS OF BREATH *UNUSUAL BRUISING OR BLEEDING *URINARY PROBLEMS (pain or burning when urinating, or frequent urination) *BOWEL PROBLEMS (unusual diarrhea, constipation, pain near the anus) TENDERNESS IN MOUTH AND THROAT WITH OR WITHOUT PRESENCE OF ULCERS (sore throat, sores in mouth, or a toothache) UNUSUAL RASH, SWELLING OR PAIN  UNUSUAL VAGINAL DISCHARGE OR ITCHING   Items with * indicate a potential emergency and should be followed up as soon as possible or go to the Emergency Department if any problems should occur.  Please show the CHEMOTHERAPY ALERT CARD or  IMMUNOTHERAPY ALERT CARD at check-in to the Emergency Department and triage nurse.  Should you have questions after your visit or need to cancel or reschedule your appointment, please contact [facility] - A DEPT OF [facility]  Dept: [phone]  and follow the prompts.  Office hours are 8:00 a.m. to 4:30 p.m. Monday - Friday. Please note that voicemails left after 4:00 p.m. may not be returned until the following business day.  We are closed weekends and major holidays. You have access to a nurse at all times for urgent questions. Please call the main number to the clinic Dept: [phone] and follow the prompts.   For any non-urgent questions, you may also contact your provider using MyChart. We now offer e-Visits for anyone 73 and older to request care online for non-urgent symptoms. For details visit [email or web address].   Also download the MyChart app! Go to the app store, search "MyChart", open the app, select [place], and log in with your MyChart username and password.

## 2024-04-25 ENCOUNTER — Other Ambulatory Visit: Payer: Self-pay

## 2024-04-27 ENCOUNTER — Other Ambulatory Visit: Payer: Self-pay

## 2024-04-27 LAB — MULTIPLE MYELOMA PANEL, SERUM
Albumin SerPl Elph-Mcnc: 3.8 g/dL (ref 2.9–4.4)
Albumin/Glob SerPl: 2 — ABNORMAL HIGH (ref 0.7–1.7)
Alpha 1: 0.2 g/dL (ref 0.0–0.4)
Alpha2 Glob SerPl Elph-Mcnc: 0.5 g/dL (ref 0.4–1.0)
B-Globulin SerPl Elph-Mcnc: 0.9 g/dL (ref 0.7–1.3)
Gamma Glob SerPl Elph-Mcnc: 0.4 g/dL (ref 0.4–1.8)
Globulin, Total: 2 g/dL — ABNORMAL LOW (ref 2.2–3.9)
IgA: 20 mg/dL — ABNORMAL LOW (ref 61–437)
IgG (Immunoglobin G), Serum: 513 mg/dL — ABNORMAL LOW (ref 603–1613)
IgM (Immunoglobulin M), Srm: 13 mg/dL — ABNORMAL LOW (ref 20–172)
Total Protein ELP: 5.8 g/dL — ABNORMAL LOW (ref 6.0–8.5)

## 2024-04-29 ENCOUNTER — Other Ambulatory Visit: Payer: Self-pay

## 2024-05-02 ENCOUNTER — Encounter: Payer: Self-pay | Admitting: Hematology

## 2024-05-02 NOTE — Progress Notes (Signed)
 HEMATOLOGY/ONCOLOGY CLINIC NOTE  Date of Service: .04/23/2024   Patient Care Team: Redmon, Noelle, PA as PCP - General (Nurse Practitioner)  CHIEF COMPLAINTS/PURPOSE OF CONSULTATION:  Follow-up for continued evaluation and management of multiple myeloma  HISTORY OF PRESENTING ILLNESS:   Anthony [name] is a wonderful 63 y.o. male who has been referred to us  by Dr Alvera Reagin, PA for evaluation and management of newly diagnosed multiple myeloma. He reports He is doing well.  He reports persistent lower back pain that he has had since he was in his 20's. He notes no previous significant injuries. He further notes that he gets chiropractic adjustments and takes Tylenol  to manage his symptoms. He notes less back pain when standing up on his right leg and maintaining weight on his right leg.  He reports previous history of smoking and chewing tobacco over 40 years ago.  He had a recent fall back in February of this year with only a pulled muscle. And he notes another fall when chasing his cat where he fell on his right shoulder. He reports pain in right shoulder.   He had a recent COVID-19 infection back in March.  He reports intermittent cough with SOB. He notes he takes 2 Rolaids at night. We discussed potentially trying antacids which he is agreeable to as he will begin steroids soon and was advised of the possible symptoms and side effects from taking steroids.  We discussed CRAB criteria and that he meets at least two of the criterion being  anemia and bone disease.  We discussed getting additional scans for further evaluation which he is agreeable to.  We further discussed starting Daratumumab /Velcade /Dexamethasone /Zometa  and Revlimid  for treatment which he is agreeable to. We also discussed starting steroids before treatment and taking an acid suppressant which he was also agreeable to.  We discussed getting Senna and taking it as needed for constipation.  Labs done today  were reviewed in detail.  We discussed his recent bone marrow biopsy and aspiration done 02/06/2022.  We discussed PET/CT scan done 01/28/2022.  We discussed CT right shoulder w/o contrast done 11/13/2021  INTERVAL HISTORY:  Anthony [name] is a 63 y.o. male who is here for continued evaluation and management of high risk multiple myeloma. He notes no acute new focal symptoms. No new dental issues. No infection issues since his last clinic visit. No notable new toxicities from his current treatment plan. Myeloma related pain is well-controlled with his current pain regimen.  MEDICAL HISTORY:  Past Medical History:  Diagnosis Date   Back pain    Cancer (HCC)    Multiple myeloma (HCC) 02/15/2022    SURGICAL HISTORY: Past Surgical History:  Procedure Laterality Date   IR BONE TUMOR(S)RF ABLATION  05/07/2022   IR BONE TUMOR(S)RF ABLATION  05/07/2022   IR BONE TUMOR(S)RF ABLATION  05/07/2022   IR KYPHO EA ADDL LEVEL THORACIC OR LUMBAR  05/07/2022   IR KYPHO LUMBAR INC FX REDUCE BONE BX UNI/BIL CANNULATION INC/IMAGING  05/07/2022   IR KYPHO THORACIC WITH BONE BIOPSY  05/07/2022   IR RADIOLOGIST EVAL & MGMT  04/23/2022   IR RADIOLOGIST EVAL & MGMT  05/17/2022   IR RADIOLOGIST EVAL & MGMT  06/06/2022    SOCIAL HISTORY: Social History   Socioeconomic History   Marital status: Married    Spouse name: Not on file   Number of children: Not on file   Years of education: Not on file   Highest education level: Not on  file  Occupational History   Not on file  Tobacco Use   Smoking status: Never   Smokeless tobacco: Former    Types: Chew, Snuff  Vaping Use   Vaping status: Never Used  Substance and Sexual Activity   Alcohol use: Yes    Alcohol/week: 2.0 standard drinks of alcohol    Types: 2 Glasses of wine per week   Drug use: No   Sexual activity: Yes  Other Topics Concern   Not on file  Social History Narrative   Not on file   Social Drivers of Health   Financial  Resource Strain: Low Risk  (05/07/2022)   Overall Financial Resource Strain (CARDIA)    Difficulty of Paying Living Expenses: Not very hard  Food Insecurity: No Food Insecurity (05/07/2022)   Hunger Vital Sign    Worried About Running Out of Food in the Last Year: Never true    Ran Out of Food in the Last Year: Never true  Transportation Needs: No Transportation Needs (05/07/2022)   PRAPARE - Administrator, Civil Service (Medical): No    Lack of Transportation (Non-Medical): No  Physical Activity: Not on file  Stress: Not on file  Social Connections: Not on file  Intimate Partner Violence: Unknown (05/07/2022)   Humiliation, Afraid, Rape, and Kick questionnaire    Fear of Current or Ex-Partner: No    Emotionally Abused: No    Physically Abused: No    Sexually Abused: Not on file    FAMILY HISTORY: Family History  Problem Relation Age of Onset   Rheum arthritis Mother    Other Sister        Pre-cancerous uterine mass;    Rheum arthritis Sister    Bone cancer Paternal Grandfather    Brain cancer Maternal Uncle    Brain cancer Maternal Aunt    Osteoporosis Neg Hx     ALLERGIES:  has no known allergies.  MEDICATIONS:  Current Outpatient Medications  Medication Sig Dispense Refill   acyclovir  (ZOVIRAX ) 400 MG tablet Take 1 tablet (400 mg total) by mouth 2 (two) times daily. 10 tablet 0   aluminum chloride (DRYSOL) 20 % external solution Apply 1 application topically at bedtime.     apixaban  (ELIQUIS ) 5 MG TABS tablet Take 1 tablet (5 mg total) by mouth 2 (two) times daily. 10 tablet 0   calcium-vitamin D  (OSCAL WITH D) 250-125 MG-UNIT tablet Take 1 tablet by mouth daily.     cyanocobalamin  (VITAMIN B12) 1000 MCG tablet Take 1 tablet (1,000 mcg total) by mouth daily. 30 tablet 3   diclofenac sodium (VOLTAREN) 1 % GEL Apply 2 g topically as needed.     ergocalciferol  (VITAMIN D2) 1.25 MG (50000 UT) capsule Take 1 capsule (50,000 Units total) by mouth 2 (two) times a  week. 12 capsule 2   esomeprazole  (NEXIUM ) 40 MG capsule Take 1 capsule (40 mg total) by mouth daily. Take 1 capsule (40 mg total) daily before breakfast 30 capsule 3   famotidine  (PEPCID ) 40 MG tablet Take 40 mg by mouth daily.     fentaNYL  (DURAGESIC ) 25 MCG/HR Place 1 patch onto the skin every 3 (three) days. 10 patch 0   lidocaine  (LIDODERM ) 5 % Place 1 patch onto the skin daily. Remove & Discard patch within 12 hours or as directed by MD 30 patch 0   loratadine (CLARITIN) 10 MG tablet Take 10 mg by mouth daily.     LORazepam  (ATIVAN ) 0.5 MG tablet Take 1  tablet (0.5 mg total) by mouth every 6 (six) hours as needed (Nausea or vomiting). 30 tablet 0   losartan (COZAAR) 50 MG tablet Take 25 mg by mouth.     methocarbamol  (ROBAXIN ) 500 MG tablet Take 1 tablet (500 mg total) by mouth every 8 (eight) hours as needed for muscle spasms. 30 tablet 0   metroNIDAZOLE  (METROGEL ) 1 % gel Apply topically daily. 45 g 0   Multiple Vitamin (MULTIVITAMIN) tablet Take 1 tablet by mouth daily.     Omega-3 Fatty Acids (FISH OIL) 1360 MG CAPS Take 1 capsule by mouth daily.     ondansetron  (ZOFRAN ) 8 MG tablet Take 1 tablet (8 mg total) by mouth every 8 (eight) hours as needed for nausea or vomiting. 30 tablet 0   oxyCODONE  (ROXICODONE ) 5 MG immediate release tablet Take 1 tablet (5 mg total) by mouth every 4 (four) hours as needed for severe pain (pain score 7-10) or moderate pain (pain score 4-6). Take 1 to 2 tablets every 4 hours as needed for moderate to severe pain. 60 tablet 0   prochlorperazine  (COMPAZINE ) 10 MG tablet TAKE ONE TABLET BY MOUTH EVERY 6 HOURS AS NEEDED FOR NAUSEA AND VOMITING 30 tablet 1   terbinafine (LAMISIL) 1 % cream Apply 1 application topically 2 (two) times daily.     cyclobenzaprine (FLEXERIL) 10 MG tablet Take 10 mg by mouth 3 (three) times daily as needed for muscle spasms. (Patient not taking: Reported on 04/23/2024)     dronabinol  (MARINOL ) 10 MG capsule Take 1 capsule (10 mg total) by  mouth 2 (two) times daily before a meal. (Patient not taking: Reported on 04/23/2024) 60 capsule 0   escitalopram (LEXAPRO) 10 MG tablet Take 10 mg by mouth daily. (Patient not taking: Reported on 04/23/2024)     REVLIMID  15 MG capsule TAKE 1 CAPSULE DAILY FOR 21 DAYS ON, THEN 7 DAYS OFF 21 capsule 0   senna-docusate (SENNA S) 8.6-50 MG tablet Take 2 tablets by mouth at bedtime. (Patient not taking: Reported on 04/23/2024) 60 tablet 1   No current facility-administered medications for this visit.   REVIEW OF SYSTEMS:  .10 Point review of Systems was done is negative except as noted above.   PHYSICAL EXAMINATION: .BP 132/88   Pulse 64   Temp 97.9 F (36.6 C)   Resp 20   Wt 185 lb 8 oz (84.1 kg)   SpO2 98%   BMI 32.86 kg/m  . GENERAL:alert, in no acute distress and comfortable SKIN: no acute rashes, no significant lesions EYES: conjunctiva are pink and non-injected, sclera anicteric OROPHARYNX: MMM, no exudates, no oropharyngeal erythema or ulceration NECK: supple, no JVD LYMPH:  no palpable lymphadenopathy in the cervical, axillary or inguinal regions LUNGS: clear to auscultation b/l with normal respiratory effort HEART: regular rate & rhythm ABDOMEN:  normoactive bowel sounds , non tender, not distended. Extremity: no pedal edema PSYCH: alert & oriented x 3 with fluent speech NEURO: no focal motor/sensory deficits  LABORATORY DATA:  I have reviewed the data as listed .    Latest Ref Rng & Units 04/23/2024    8:37 AM 04/07/2024    1:07 PM 03/24/2024   12:09 PM  CBC  WBC 4.0 - 10.5 K/uL 3.3  3.7  3.4   Hemoglobin 13.0 - 17.0 g/dL 84.7  85.6  85.5   Hematocrit 39.0 - 52.0 % 44.6  42.1  41.4   Platelets 150 - 400 K/uL 134  234  125  Latest Ref Rng & Units 04/23/2024    8:37 AM 04/07/2024    1:07 PM 03/24/2024   12:09 PM  CMP  Glucose 70 - 99 mg/dL 90  87  865   BUN 8 - 23 mg/dL 13  11  13    Creatinine 0.61 - 1.24 mg/dL 9.21  9.28  9.17   Sodium 135 - 145 mmol/L 138  136   136   Potassium 3.5 - 5.1 mmol/L 4.0  4.1  3.7   Chloride 98 - 111 mmol/L 108  106  106   CO2 22 - 32 mmol/L 24  24  24    Calcium 8.9 - 10.3 mg/dL 9.0  8.8  8.7   Total Protein 6.5 - 8.1 g/dL 6.2  6.2  5.7   Total Bilirubin 0.0 - 1.2 mg/dL 0.8  0.7  1.0   Alkaline Phos 38 - 126 U/L 53  54  50   AST 15 - 41 U/L 14  12  14    ALT 0 - 44 U/L 18  10  15       PATHOLOGY Surgical Pathology CASE: [id number] PATIENT: Toure Zaucha Bone Marrow Report     Clinical History: Multiple Myeloma     DIAGNOSIS:  BONE MARROW, ASPIRATE, CLOT, CORE: -Hypercellular bone marrow (60%) with erythroid predominant trilineage hematopoiesis and no evidence of residual plasma cell neoplasm (less than 1% plasma cells by manual aspirate differential, and CD138 immunohistochemical analysis of clot and core sections).  PERIPHERAL BLOOD: -Leukopenia  MICROSCOPIC DESCRIPTION:  PERIPHERAL BLOOD SMEAR: Platelets: Adequate in number, no platelet clumps identified Erythroid: Normocytic normochromic red blood cells Leukocytes: Mild leukopenia, negative for dysplastic granulocytes, blasts or  plasma cells  BONE MARROW ASPIRATE: Cellular Erythroid precursors: Relatively increased, show a full sequence of maturation with occasional abnormal forms (including binucleate forms, megaloblastoid change and nuclear membrane irregularities) Granulocytic precursors: Decreased, show a full sequence of generally orderly maturation Megakaryocytes: Normal in number and morphology Lymphocytes/plasma cells: Not increased   02/06/2022 Molecular pathology   RADIOGRAPHIC STUDIES: I have personally reviewed the radiological images as listed and agreed with the findings in the report. No results found.   ASSESSMENT & PLAN:   63 y.o. very pleasant male with  1. R-ISS stage III multiple myeloma with extensive bone metastases and patholgiic fracture rt shoulder -Recent bone marrow biopsy and aspiration done  02/06/2022 revealed hypercellular bone marrow with plasma cell neoplasm. Cytogenetics-normal karyotype Molecular cytogenetics-TP53 deletion and duplication of 1 q.SABRA  PLAN: Lab results from today were discussed with the patient in detail CBC stable WBC count of 3.3k with normal hemoglobin of 15.2 and platelets of 134k CMP stable Myeloma panel shows no measurable M spike and negative IFE No notable toxicities from his current maintenance regimen Carfilzomib  and Revlimid  maintenance at current doses Continue aspirin for VTE prophylaxis Continue current B12 B complex and vitamin D  replacement. Continue follow-up with urology for management of prostate symptoms and elevated PSA.  FOLLOW-UP: Per integrated scheduling MD visit in 6-8 weeks  .The total time spent in the appointment was 30 minutes* .  All of the patient's questions were answered with apparent satisfaction. The patient knows to call the clinic with any problems, questions or concerns.   Emaline Saran MD MS AAHIVMS [facility] Hematology/Oncology Physician [facility]  .*Total Encounter Time as defined by the Centers for Medicare and Medicaid Services includes, in addition to the face-to-face time of a patient visit (documented in the note above) non-face-to-face time: obtaining and  reviewing outside history, ordering and reviewing medications, tests or procedures, care coordination (communications with other health care professionals or caregivers) and documentation in the medical record.

## 2024-05-03 NOTE — Progress Notes (Signed)
 Pt called to report that he is in the hospital in [place] and will have his gallbladder taken out tomorrow. Pt informed per Dr Onesimo that he needs to stop Revlimid  . We will restart later. Pt to skip tx for 4 weeks. Pt acknowledged information and verbalized understanding.

## 2024-05-07 ENCOUNTER — Other Ambulatory Visit

## 2024-05-07 ENCOUNTER — Ambulatory Visit

## 2024-05-20 ENCOUNTER — Other Ambulatory Visit: Payer: Self-pay

## 2024-05-20 DIAGNOSIS — C9000 Multiple myeloma not having achieved remission: Secondary | ICD-10-CM

## 2024-05-20 MED ORDER — REVLIMID 15 MG PO CAPS: ORAL_CAPSULE | ORAL | 0 refills | Status: DC

## 2024-05-21 ENCOUNTER — Other Ambulatory Visit

## 2024-05-21 ENCOUNTER — Ambulatory Visit: Admitting: Hematology

## 2024-05-21 ENCOUNTER — Ambulatory Visit

## 2024-05-21 ENCOUNTER — Encounter: Payer: Self-pay | Admitting: Hematology

## 2024-05-21 MED ORDER — FENTANYL 25 MCG/HR TD PT72: 1.0000 | MEDICATED_PATCH | TRANSDERMAL | 0 refills | Status: DC

## 2024-06-01 ENCOUNTER — Encounter: Payer: Self-pay | Admitting: Physician Assistant

## 2024-06-01 ENCOUNTER — Other Ambulatory Visit: Payer: Self-pay | Admitting: Physician Assistant

## 2024-06-01 DIAGNOSIS — R1031 Right lower quadrant pain: Secondary | ICD-10-CM

## 2024-06-01 DIAGNOSIS — G8929 Other chronic pain: Secondary | ICD-10-CM

## 2024-06-03 ENCOUNTER — Ambulatory Visit
Admission: RE | Admit: 2024-06-03 | Discharge: 2024-06-03 | Disposition: A | Discharge status: Home / Self Care | Source: Ambulatory Visit | Attending: Physician Assistant | Admitting: Physician Assistant

## 2024-06-03 ENCOUNTER — Encounter: Payer: Self-pay | Admitting: Hematology

## 2024-06-03 ENCOUNTER — Other Ambulatory Visit: Payer: Self-pay

## 2024-06-03 DIAGNOSIS — R1031 Right lower quadrant pain: Secondary | ICD-10-CM

## 2024-06-03 MED ORDER — IOPAMIDOL (ISOVUE-300) INJECTION 61%
100.0000 mL | Freq: Once | INTRAVENOUS | Status: AC | PRN
  Administered 2024-06-03: 100 mL via INTRAVENOUS

## 2024-06-04 ENCOUNTER — Inpatient Hospital Stay: Attending: Physician Assistant

## 2024-06-04 ENCOUNTER — Other Ambulatory Visit

## 2024-06-04 ENCOUNTER — Inpatient Hospital Stay

## 2024-06-04 ENCOUNTER — Ambulatory Visit: Admitting: Hematology

## 2024-06-04 VITALS — BP 121/78 | HR 69 | Temp 98.0°F | Resp 18 | Wt 179.2 lb

## 2024-06-04 DIAGNOSIS — Z808 Family history of malignant neoplasm of other organs or systems: Secondary | ICD-10-CM | POA: Insufficient documentation

## 2024-06-04 DIAGNOSIS — Z7189 Other specified counseling: Secondary | ICD-10-CM

## 2024-06-04 DIAGNOSIS — Z7901 Long term (current) use of anticoagulants: Secondary | ICD-10-CM | POA: Diagnosis not present

## 2024-06-04 DIAGNOSIS — C9000 Multiple myeloma not having achieved remission: Secondary | ICD-10-CM

## 2024-06-04 DIAGNOSIS — Z7982 Long term (current) use of aspirin: Secondary | ICD-10-CM | POA: Insufficient documentation

## 2024-06-04 DIAGNOSIS — Z5112 Encounter for antineoplastic immunotherapy: Secondary | ICD-10-CM | POA: Insufficient documentation

## 2024-06-04 DIAGNOSIS — Z87891 Personal history of nicotine dependence: Secondary | ICD-10-CM | POA: Diagnosis not present

## 2024-06-04 DIAGNOSIS — M545 Low back pain, unspecified: Secondary | ICD-10-CM | POA: Diagnosis not present

## 2024-06-04 DIAGNOSIS — Z809 Family history of malignant neoplasm, unspecified: Secondary | ICD-10-CM | POA: Diagnosis not present

## 2024-06-04 DIAGNOSIS — R972 Elevated prostate specific antigen [PSA]: Secondary | ICD-10-CM | POA: Insufficient documentation

## 2024-06-04 DIAGNOSIS — R0602 Shortness of breath: Secondary | ICD-10-CM | POA: Insufficient documentation

## 2024-06-04 DIAGNOSIS — R059 Cough, unspecified: Secondary | ICD-10-CM | POA: Diagnosis not present

## 2024-06-04 DIAGNOSIS — Z79624 Long term (current) use of inhibitors of nucleotide synthesis: Secondary | ICD-10-CM | POA: Insufficient documentation

## 2024-06-04 LAB — CMP (CANCER CENTER ONLY)
ALT: 21 U/L (ref 0–44)
AST: 20 U/L (ref 15–41)
Albumin: 4.2 g/dL (ref 3.5–5.0)
Alkaline Phosphatase: 65 U/L (ref 38–126)
Anion gap: 6 (ref 5–15)
BUN: 14 mg/dL (ref 8–23)
CO2: 25 mmol/L (ref 22–32)
Calcium: 9.7 mg/dL (ref 8.9–10.3)
Chloride: 106 mmol/L (ref 98–111)
Creatinine: 0.82 mg/dL (ref 0.61–1.24)
GFR, Estimated: >60 mL/min (ref >60)
Glucose, Bld: 132 mg/dL — ABNORMAL HIGH (ref 70–99)
Potassium: 4 mmol/L (ref 3.5–5.1)
Sodium: 137 mmol/L (ref 135–145)
Total Bilirubin: 0.8 mg/dL (ref 0.0–1.2)
Total Protein: 6.6 g/dL (ref 6.5–8.1)

## 2024-06-04 LAB — CBC WITH DIFFERENTIAL (CANCER CENTER ONLY)
Abs Immature Granulocytes: 0.01 K/uL (ref 0.00–0.07)
Basophils Absolute: 0 K/uL (ref 0.0–0.1)
Basophils Relative: 0 %
Eosinophils Absolute: 0.1 K/uL (ref 0.0–0.5)
Eosinophils Relative: 2 %
HCT: 43.7 % (ref 39.0–52.0)
Hemoglobin: 15 g/dL (ref 13.0–17.0)
Immature Granulocytes: 0 %
Lymphocytes Relative: 36 %
Lymphs Abs: 1.1 K/uL (ref 0.7–4.0)
MCH: 29 pg (ref 26.0–34.0)
MCHC: 34.3 g/dL (ref 30.0–36.0)
MCV: 84.4 fL (ref 80.0–100.0)
Monocytes Absolute: 0.5 K/uL (ref 0.1–1.0)
Monocytes Relative: 15 %
Neutro Abs: 1.5 K/uL — ABNORMAL LOW (ref 1.7–7.7)
Neutrophils Relative %: 47 %
Platelet Count: 172 K/uL (ref 150–400)
RBC: 5.18 MIL/uL (ref 4.22–5.81)
RDW: 14.7 % (ref 11.5–15.5)
WBC Count: 3.1 K/uL — ABNORMAL LOW (ref 4.0–10.5)
nRBC: 0 % (ref 0.0–0.2)

## 2024-06-04 MED ORDER — PALONOSETRON HCL INJECTION 0.25 MG/5ML
0.2500 mg | Freq: Once | INTRAVENOUS | Status: AC
  Administered 2024-06-04: 0.25 mg via INTRAVENOUS
  Filled 2024-06-04: qty 5

## 2024-06-04 MED ORDER — ACETAMINOPHEN 325 MG PO TABS
650.0000 mg | ORAL_TABLET | Freq: Once | ORAL | Status: AC
  Administered 2024-06-04: 650 mg via ORAL
  Filled 2024-06-04: qty 2

## 2024-06-04 MED ORDER — DEXAMETHASONE SODIUM PHOSPHATE 10 MG/ML IJ SOLN
8.0000 mg | Freq: Once | INTRAMUSCULAR | Status: DC
  Filled 2024-06-04: qty 0.8

## 2024-06-04 MED ORDER — SODIUM CHLORIDE 0.9 % IV SOLN: Freq: Once | INTRAVENOUS | Status: AC

## 2024-06-04 MED ORDER — DEXAMETHASONE SODIUM PHOSPHATE 10 MG/ML IJ SOLN
8.0000 mg | Freq: Once | INTRAMUSCULAR | Status: AC
  Administered 2024-06-04: 8 mg via INTRAVENOUS

## 2024-06-04 MED ORDER — DEXTROSE 5 % IV SOLN
70.0000 mg/m2 | Freq: Once | INTRAVENOUS | Status: AC
  Administered 2024-06-04: 140 mg via INTRAVENOUS
  Filled 2024-06-04: qty 60

## 2024-06-04 MED ORDER — ZOLEDRONIC ACID 4 MG/100ML IV SOLN
4.0000 mg | Freq: Once | INTRAVENOUS | Status: AC
  Administered 2024-06-04: 4 mg via INTRAVENOUS
  Filled 2024-06-04: qty 100

## 2024-06-04 NOTE — Patient Instructions (Signed)
 [facility] - A DEPT OF [facility]   Discharge Instructions: Thank you for choosing [facility] to provide your oncology and hematology care.   If you have a lab appointment with the Cancer Center, please go directly to the Cancer Center and check in at the registration area.   Wear comfortable clothing and clothing appropriate for easy access to any Portacath or PICC line.   We strive to give you quality time with your provider. You may need to reschedule your appointment if you arrive late (15 or more minutes).  Arriving late affects you and other patients whose appointments are after yours.  Also, if you miss three or more appointments without notifying the office, you may be dismissed from the clinic at the provider's discretion.      For prescription refill requests, have your pharmacy contact our office and allow 72 hours for refills to be completed.    Today you received the following chemotherapy and/or immunotherapy agents: Carfilzomib (Kyprolis)      To help prevent nausea and vomiting after your treatment, we encourage you to take your nausea medication as directed.  BELOW ARE SYMPTOMS THAT SHOULD BE REPORTED IMMEDIATELY: *FEVER GREATER THAN 100.4 F (38 C) OR HIGHER *CHILLS OR SWEATING *NAUSEA AND VOMITING THAT IS NOT CONTROLLED WITH YOUR NAUSEA MEDICATION *UNUSUAL SHORTNESS OF BREATH *UNUSUAL BRUISING OR BLEEDING *URINARY PROBLEMS (pain or burning when urinating, or frequent urination) *BOWEL PROBLEMS (unusual diarrhea, constipation, pain near the anus) TENDERNESS IN MOUTH AND THROAT WITH OR WITHOUT PRESENCE OF ULCERS (sore throat, sores in mouth, or a toothache) UNUSUAL RASH, SWELLING OR PAIN  UNUSUAL VAGINAL DISCHARGE OR ITCHING   Items with * indicate a potential emergency and should be followed up as soon as possible or go to the Emergency Department if any problems should occur.  Please show the CHEMOTHERAPY ALERT CARD or  IMMUNOTHERAPY ALERT CARD at check-in to the Emergency Department and triage nurse.  Should you have questions after your visit or need to cancel or reschedule your appointment, please contact [facility] - A DEPT [facility]  Dept: [phone]  and follow the prompts.  Office hours are 8:00 a.m. to 4:30 p.m. Monday - Friday. Please note that voicemails left after 4:00 p.m. may not be returned until the following business day.  We are closed weekends and major holidays. You have access to a nurse at all times for urgent questions. Please call the main number to the clinic Dept: [phone] and follow the prompts.   For any non-urgent questions, you may also contact your provider using MyChart. We now offer e-Visits for anyone 55 and older to request care online for non-urgent symptoms. For details visit [email or web address].   Also download the MyChart app! Go to the app store, search "MyChart", open the app, select [place], and log in with your MyChart username and password.

## 2024-06-10 LAB — MULTIPLE MYELOMA PANEL, SERUM
Albumin SerPl Elph-Mcnc: 3.9 g/dL (ref 2.9–4.4)
Albumin/Glob SerPl: 1.6 (ref 0.7–1.7)
Alpha 1: 0.3 g/dL (ref 0.0–0.4)
Alpha2 Glob SerPl Elph-Mcnc: 0.7 g/dL (ref 0.4–1.0)
B-Globulin SerPl Elph-Mcnc: 1 g/dL (ref 0.7–1.3)
Gamma Glob SerPl Elph-Mcnc: 0.7 g/dL (ref 0.4–1.8)
Globulin, Total: 2.6 g/dL (ref 2.2–3.9)
IgA: 84 mg/dL (ref 61–437)
IgG (Immunoglobin G), Serum: 676 mg/dL (ref 603–1613)
IgM (Immunoglobulin M), Srm: 109 mg/dL (ref 20–172)
Total Protein ELP: 6.5 g/dL (ref 6.0–8.5)

## 2024-06-15 ENCOUNTER — Other Ambulatory Visit: Payer: Self-pay

## 2024-06-17 ENCOUNTER — Other Ambulatory Visit: Payer: Self-pay

## 2024-06-17 DIAGNOSIS — C9000 Multiple myeloma not having achieved remission: Secondary | ICD-10-CM

## 2024-06-17 MED ORDER — REVLIMID 15 MG PO CAPS: ORAL_CAPSULE | ORAL | 0 refills | Status: AC

## 2024-06-18 ENCOUNTER — Inpatient Hospital Stay

## 2024-06-18 ENCOUNTER — Inpatient Hospital Stay: Admitting: Dietician

## 2024-06-18 ENCOUNTER — Inpatient Hospital Stay (HOSPITAL_BASED_OUTPATIENT_CLINIC_OR_DEPARTMENT_OTHER): Admitting: Hematology

## 2024-06-18 VITALS — BP 116/84 | HR 72 | Temp 97.7°F | Resp 20 | Wt 181.5 lb

## 2024-06-18 DIAGNOSIS — C9000 Multiple myeloma not having achieved remission: Secondary | ICD-10-CM

## 2024-06-18 DIAGNOSIS — C61 Malignant neoplasm of prostate: Secondary | ICD-10-CM

## 2024-06-18 DIAGNOSIS — Z7189 Other specified counseling: Secondary | ICD-10-CM

## 2024-06-18 DIAGNOSIS — R972 Elevated prostate specific antigen [PSA]: Secondary | ICD-10-CM | POA: Diagnosis not present

## 2024-06-18 DIAGNOSIS — Z5111 Encounter for antineoplastic chemotherapy: Secondary | ICD-10-CM

## 2024-06-18 DIAGNOSIS — Z5112 Encounter for antineoplastic immunotherapy: Secondary | ICD-10-CM | POA: Diagnosis not present

## 2024-06-18 LAB — CBC WITH DIFFERENTIAL (CANCER CENTER ONLY)
Abs Immature Granulocytes: 0.01 K/uL (ref 0.00–0.07)
Basophils Absolute: 0 K/uL (ref 0.0–0.1)
Basophils Relative: 0 %
Eosinophils Absolute: 0.1 K/uL (ref 0.0–0.5)
Eosinophils Relative: 2 %
HCT: 42.5 % (ref 39.0–52.0)
Hemoglobin: 14.6 g/dL (ref 13.0–17.0)
Immature Granulocytes: 0 %
Lymphocytes Relative: 26 %
Lymphs Abs: 1 K/uL (ref 0.7–4.0)
MCH: 29.3 pg (ref 26.0–34.0)
MCHC: 34.4 g/dL (ref 30.0–36.0)
MCV: 85.2 fL (ref 80.0–100.0)
Monocytes Absolute: 0.5 K/uL (ref 0.1–1.0)
Monocytes Relative: 13 %
Neutro Abs: 2.2 K/uL (ref 1.7–7.7)
Neutrophils Relative %: 59 %
Platelet Count: 201 K/uL (ref 150–400)
RBC: 4.99 MIL/uL (ref 4.22–5.81)
RDW: 14.6 % (ref 11.5–15.5)
WBC Count: 3.8 K/uL — ABNORMAL LOW (ref 4.0–10.5)
nRBC: 0 % (ref 0.0–0.2)

## 2024-06-18 LAB — CMP (CANCER CENTER ONLY)
ALT: 87 U/L — ABNORMAL HIGH (ref 0–44)
AST: 48 U/L — ABNORMAL HIGH (ref 15–41)
Albumin: 4.1 g/dL (ref 3.5–5.0)
Alkaline Phosphatase: 124 U/L (ref 38–126)
Anion gap: 4 — ABNORMAL LOW (ref 5–15)
BUN: 15 mg/dL (ref 8–23)
CO2: 26 mmol/L (ref 22–32)
Calcium: 9.1 mg/dL (ref 8.9–10.3)
Chloride: 106 mmol/L (ref 98–111)
Creatinine: 0.79 mg/dL (ref 0.61–1.24)
GFR, Estimated: >60 mL/min (ref >60)
Glucose, Bld: 98 mg/dL (ref 70–99)
Potassium: 4.1 mmol/L (ref 3.5–5.1)
Sodium: 136 mmol/L (ref 135–145)
Total Bilirubin: 0.7 mg/dL (ref 0.0–1.2)
Total Protein: 6.2 g/dL — ABNORMAL LOW (ref 6.5–8.1)

## 2024-06-18 MED ORDER — SODIUM CHLORIDE 0.9 % IV SOLN: Freq: Once | INTRAVENOUS | Status: AC

## 2024-06-18 MED ORDER — PALONOSETRON HCL INJECTION 0.25 MG/5ML
0.2500 mg | Freq: Once | INTRAVENOUS | Status: AC
  Administered 2024-06-18: 0.25 mg via INTRAVENOUS
  Filled 2024-06-18: qty 5

## 2024-06-18 MED ORDER — DEXTROSE 5 % IV SOLN
70.0000 mg/m2 | Freq: Once | INTRAVENOUS | Status: AC
  Administered 2024-06-18: 140 mg via INTRAVENOUS
  Filled 2024-06-18: qty 60

## 2024-06-18 MED ORDER — DEXAMETHASONE SODIUM PHOSPHATE 10 MG/ML IJ SOLN
8.0000 mg | Freq: Once | INTRAMUSCULAR | Status: AC
  Administered 2024-06-18: 8 mg via INTRAVENOUS

## 2024-06-18 NOTE — Progress Notes (Signed)
 HEMATOLOGY ONCOLOGY PROGRESS NOTE  Date of service: 06/18/2024  Patient Care Team: Redmon, Noelle, [place] as PCP - General (Nurse Practitioner)  CHIEF COMPLAINT/PURPOSE OF CONSULTATION: Follow-up for continued evaluation and management of multiple myeloma Concern for progression of Prostate cancer  HISTORY OF PRESENTING ILLNESS:  Anthony [name] is a wonderful 63 y.o. male who has been referred to us  by Dr Alvera Reagin, PA for evaluation and management of newly diagnosed multiple myeloma. He reports He is doing well.   He reports persistent lower back pain that he has had since he was in his 20's. He notes no previous significant injuries. He further notes that he gets chiropractic adjustments and takes Tylenol  to manage his symptoms. He notes less back pain when standing up on his right leg and maintaining weight on his right leg.   He reports previous history of smoking and chewing tobacco over 40 years ago.   He had a recent fall back in February of this year with only a pulled muscle. And he notes another fall when chasing his cat where he fell on his right shoulder. He reports pain in right shoulder.    He had a recent COVID-19 infection back in March.   He reports intermittent cough with SOB. He notes he takes 2 Rolaids at night. We discussed potentially trying antacids which he is agreeable to as he will begin steroids soon and was advised of the possible symptoms and side effects from taking steroids.   We discussed CRAB criteria and that he meets at least two of the criterion being  anemia and bone disease.   We discussed getting additional scans for further evaluation which he is agreeable to.   We further discussed starting Daratumumab /Velcade /Dexamethasone /Zometa  and Revlimid  for treatment which he is agreeable to. We also discussed starting steroids before treatment and taking an acid suppressant which he was also agreeable to.   We discussed getting Senna and taking it as  needed for constipation.   Labs done today were reviewed in detail.   We discussed his recent bone marrow biopsy and aspiration done 02/06/2022.   We discussed PET/CT scan done 01/28/2022.   We discussed CT right shoulder w/o contrast done 11/13/2021  SUMMARY OF ONCOLOGIC HISTORY: Oncology History  Multiple myeloma (HCC)  07/17/2017 Initial Diagnosis   Multiple myeloma (HCC)   02/22/2022 - 03/29/2022 Chemotherapy   Patient is on Treatment Plan : MYELOMA NEWLY DIAGNOSED TRANSPLANT CANDIDATE DaraVRd (Daratumumab  IV) q21d x 6 Cycles (Induction/Consolidation)     04/08/2022 -  Chemotherapy   Patient is on Treatment Plan : MYELOMA RELAPSED/REFRACTORY Carfilzomib  D1,8,15 + Daratumumab  + Dexamethasone  q28d     Multiple myeloma not having achieved remission (HCC)  02/21/2022 Initial Diagnosis   Multiple myeloma not having achieved remission (HCC)   02/22/2022 - 03/29/2022 Chemotherapy   Patient is on Treatment Plan : MYELOMA NEWLY DIAGNOSED TRANSPLANT CANDIDATE DaraVRd (Daratumumab  IV) q21d x 6 Cycles (Induction/Consolidation)     04/08/2022 -  Chemotherapy   Patient is on Treatment Plan : MYELOMA RELAPSED/REFRACTORY Carfilzomib  D1,8,15 + Daratumumab  + Dexamethasone  q28d     07/30/2022 Cancer Staging   Staging form: Plasma Cell Myeloma and Plasma Cell Disorders, AJCC 8th Edition - Clinical stage from 07/30/2022: High-risk cytogenetics: Present, LDH: Unknown - Signed by Onesimo Emaline Brink, MD on 07/02/2023 Histopathologic type: Multiple myeloma Stage prefix: Initial diagnosis Cytogenetics: 17p deletion Bone disease on imaging: Present    INTERVAL HISTORY:  Anthony [name] is a 63 y.o. male who is here  today for continued evaluation and management of Multiple Myeloma. accompanied by wife   he was last seen by me on 04/23/2024; at the time he did not have any concerns and was doing well.   Today, he reports that he has had increasing PSA levels and is planning for prostate biopsy with Dr.  Renda. Endorses experiencing testicle pain, for which he will have MRI imaging done.  CT abd/Plevisdone on 06/03/2024 for RLQ abd pain about 1 month after recent lap Cholecystectomy showed Status post cholecystectomy. Small postop fluid collection or biloma in gallbladder fossa. Nuclear medicine hepatobiliary study could be obtained to evaluate for bile leak if clinically warranted. Moderate to markedly enlarged prostate, increased since prior exam, with findings of chronic bladder outlet obstruction. Interval progression of diffuse lytic bone lesions, consistent with multiple myeloma. States that he is up to date on Flu and Covid-19 vaccinations.   He makes note of a hernia, which is not extremely bothersome apparently.  REVIEW OF SYSTEMS:   10 Point review of systems of done and is negative except as noted above.  MEDICAL HISTORY Past Medical History:  Diagnosis Date   Back pain    Cancer (HCC)    Multiple myeloma (HCC) 02/15/2022   Immunization History  Administered Date(s) Administered    sv, Bivalent, Protein Subunit Rsvpref,pf Marlow) 08/03/2022   Fluzone Influenza virus vaccine,trivalent (IIV3), split virus 06/01/2013, 06/07/2014, 06/14/2015, 07/05/2018, 05/10/2019, 06/15/2020   H1N1 09/09/2008   Influenza, Quadrivalent, Recombinant, Inj, Pf 06/10/2021, 05/19/2022   Influenza, Seasonal, Injecte, Preservative Fre 05/16/2023   Influenza,inj,Quad PF,6+ Mos 06/02/2017, 07/05/2018   Influenza,inj,quad, With Preservative 07/27/2016   Influenza,trivalent, recombinat, inj, PF 05/28/2024   Moderna Covid-19 Fall Seasonal Vaccine 32yrs & older 05/24/2022   PFIZER Comirnaty(Gray Top)Covid-19 Tri-Sucrose Vaccine 03/04/2021   PFIZER(Purple Top)SARS-COV-2 Vaccination 11/01/2019, 12/02/2019, 07/16/2020   PNEUMOCOCCAL CONJUGATE-20 07/31/2022   Pfizer(Comirnaty)Fall Seasonal Vaccine 12 years and older 05/16/2023, 05/28/2024   Respiratory Syncytial Virus Vaccine,Recomb Aduvanted(Arexvy)  05/16/2023   Td 10/17/2005   Tdap 12/20/2011, 04/22/2020   Zoster Recombinant(Shingrix) 03/25/2017, 01/20/2018   Zoster, Live 03/25/2017, 01/20/2018     SURGICAL HISTORY Past Surgical History:  Procedure Laterality Date   IR BONE TUMOR(S)RF ABLATION  05/07/2022   IR BONE TUMOR(S)RF ABLATION  05/07/2022   IR BONE TUMOR(S)RF ABLATION  05/07/2022   IR KYPHO EA ADDL LEVEL THORACIC OR LUMBAR  05/07/2022   IR KYPHO LUMBAR INC FX REDUCE BONE BX UNI/BIL CANNULATION INC/IMAGING  05/07/2022   IR KYPHO THORACIC WITH BONE BIOPSY  05/07/2022   IR RADIOLOGIST EVAL & MGMT  04/23/2022   IR RADIOLOGIST EVAL & MGMT  05/17/2022   IR RADIOLOGIST EVAL & MGMT  06/06/2022    SOCIAL HISTORY Social History   Tobacco Use   Smoking status: Never   Smokeless tobacco: Former    Types: Chew, Snuff  Vaping Use   Vaping status: Never Used  Substance Use Topics   Alcohol use: Yes    Alcohol/week: 2.0 standard drinks of alcohol    Types: 2 Glasses of wine per week   Drug use: No    Social History   Social History Narrative   Not on file    SOCIAL DRIVERS OF HEALTH SDOH Screenings   Food Insecurity: No Food Insecurity (05/07/2022)  Housing: Low Risk  (05/07/2022)  Transportation Needs: No Transportation Needs (05/07/2022)  Utilities: Not At Risk (05/07/2022)  Depression (PHQ2-9): Low Risk  (06/18/2024)  Financial Resource Strain: Low Risk  (05/07/2022)  Tobacco Use: Medium Risk (05/04/2024)  Received from [facility]     FAMILY HISTORY Family History  Problem Relation Age of Onset   Rheum arthritis Mother    Other Sister        Pre-cancerous uterine mass;    Rheum arthritis Sister    Bone cancer Paternal Grandfather    Brain cancer Maternal Uncle    Brain cancer Maternal Aunt    Osteoporosis Neg Hx      ALLERGIES: has no known allergies.  MEDICATIONS  Current Outpatient Medications  Medication Sig Dispense Refill   acyclovir  (ZOVIRAX ) 400 MG tablet Take 1 tablet (400 mg  total) by mouth 2 (two) times daily. 10 tablet 0   aluminum chloride (DRYSOL) 20 % external solution Apply 1 application topically at bedtime.     apixaban  (ELIQUIS ) 5 MG TABS tablet Take 1 tablet (5 mg total) by mouth 2 (two) times daily. 10 tablet 0   calcium-vitamin D  (OSCAL WITH D) 250-125 MG-UNIT tablet Take 1 tablet by mouth daily.     cyanocobalamin  (VITAMIN B12) 1000 MCG tablet Take 1 tablet (1,000 mcg total) by mouth daily. 30 tablet 3   cyclobenzaprine (FLEXERIL) 10 MG tablet Take 10 mg by mouth 3 (three) times daily as needed for muscle spasms. (Patient not taking: Reported on 04/23/2024)     diclofenac sodium (VOLTAREN) 1 % GEL Apply 2 g topically as needed.     dronabinol  (MARINOL ) 10 MG capsule Take 1 capsule (10 mg total) by mouth 2 (two) times daily before a meal. (Patient not taking: Reported on 04/23/2024) 60 capsule 0   ergocalciferol  (VITAMIN D2) 1.25 MG (50000 UT) capsule Take 1 capsule (50,000 Units total) by mouth 2 (two) times a week. 12 capsule 2   escitalopram (LEXAPRO) 10 MG tablet Take 10 mg by mouth daily. (Patient not taking: Reported on 04/23/2024)     esomeprazole  (NEXIUM ) 40 MG capsule Take 1 capsule (40 mg total) by mouth daily. Take 1 capsule (40 mg total) daily before breakfast 30 capsule 3   famotidine  (PEPCID ) 40 MG tablet Take 40 mg by mouth daily.     fentaNYL  (DURAGESIC ) 25 MCG/HR Place 1 patch onto the skin every 3 (three) days. 10 patch 0   lidocaine  (LIDODERM ) 5 % Place 1 patch onto the skin daily. Remove & Discard patch within 12 hours or as directed by MD 30 patch 0   loratadine (CLARITIN) 10 MG tablet Take 10 mg by mouth daily.     LORazepam  (ATIVAN ) 0.5 MG tablet Take 1 tablet (0.5 mg total) by mouth every 6 (six) hours as needed (Nausea or vomiting). 30 tablet 0   losartan (COZAAR) 50 MG tablet Take 25 mg by mouth.     methocarbamol  (ROBAXIN ) 500 MG tablet Take 1 tablet (500 mg total) by mouth every 8 (eight) hours as needed for muscle spasms. 30 tablet 0    metroNIDAZOLE  (METROGEL ) 1 % gel Apply topically daily. 45 g 0   Multiple Vitamin (MULTIVITAMIN) tablet Take 1 tablet by mouth daily.     Omega-3 Fatty Acids (FISH OIL) 1360 MG CAPS Take 1 capsule by mouth daily.     ondansetron  (ZOFRAN ) 8 MG tablet Take 1 tablet (8 mg total) by mouth every 8 (eight) hours as needed for nausea or vomiting. 30 tablet 0   oxyCODONE  (ROXICODONE ) 5 MG immediate release tablet Take 1 tablet (5 mg total) by mouth every 4 (four) hours as needed for severe pain (pain score 7-10) or moderate pain (pain score 4-6). Take  1 to 2 tablets every 4 hours as needed for moderate to severe pain. 60 tablet 0   prochlorperazine  (COMPAZINE ) 10 MG tablet TAKE ONE TABLET BY MOUTH EVERY 6 HOURS AS NEEDED FOR NAUSEA AND VOMITING 30 tablet 1   REVLIMID  15 MG capsule Take 1 capsule (15 mg total) by mouth daily for 21 days and then take 7 days off. Repeat cycle. 21 capsule 0   senna-docusate (SENNA S) 8.6-50 MG tablet Take 2 tablets by mouth at bedtime. (Patient not taking: Reported on 04/23/2024) 60 tablet 1   terbinafine (LAMISIL) 1 % cream Apply 1 application topically 2 (two) times daily.     No current facility-administered medications for this visit.    PHYSICAL EXAMINATION: ECOG PERFORMANCE STATUS: 1 - Symptomatic but completely ambulatory VITALS: Vitals:   06/18/24 1400  BP: 116/84  Pulse: 72  Resp: 20  Temp: 97.7 F (36.5 C)  SpO2: 99%   Filed Weights   06/18/24 1400  Weight: 181 lb 8 oz (82.3 kg)   Body mass index is 32.15 kg/m.  GENERAL: alert, in no acute distress and comfortable SKIN: no acute rashes, no significant lesions EYES: conjunctiva are pink and non-injected, sclera anicteric OROPHARYNX: MMM, no exudates, no oropharyngeal erythema or ulceration NECK: supple, no JVD LYMPH:  no palpable lymphadenopathy in the cervical, axillary or inguinal regions LUNGS: clear to auscultation b/l with normal respiratory effort HEART: regular rate & rhythm ABDOMEN:   normoactive bowel sounds , non tender, not distended, no hepatosplenomegaly Extremity: no pedal edema PSYCH: alert & oriented x 3 with fluent speech NEURO: no focal motor/sensory deficits  LABORATORY DATA:   I have reviewed the data as listed     Latest Ref Rng & Units 06/18/2024    1:02 PM 06/04/2024   12:41 PM 04/23/2024    8:37 AM  CBC EXTENDED  WBC 4.0 - 10.5 K/uL 3.8  3.1  3.3   RBC 4.22 - 5.81 MIL/uL 4.99  5.18  5.22   Hemoglobin 13.0 - 17.0 g/dL 85.3  84.9  84.7   HCT 39.0 - 52.0 % 42.5  43.7  44.6   Platelets 150 - 400 K/uL 201  172  134   NEUT# 1.7 - 7.7 K/uL 2.2  1.5  1.4   Lymph# 0.7 - 4.0 K/uL 1.0  1.1  1.0       Latest Ref Rng & Units 06/18/2024    1:02 PM 06/04/2024   12:41 PM 04/23/2024    8:37 AM  CMP  Glucose 70 - 99 mg/dL 98  867  90   BUN 8 - 23 mg/dL 15  14  13    Creatinine 0.61 - 1.24 mg/dL 9.20  9.17  9.21   Sodium 135 - 145 mmol/L 136  137  138   Potassium 3.5 - 5.1 mmol/L 4.1  4.0  4.0   Chloride 98 - 111 mmol/L 106  106  108   CO2 22 - 32 mmol/L 26  25  24    Calcium 8.9 - 10.3 mg/dL 9.1  9.7  9.0   Total Protein 6.5 - 8.1 g/dL 6.2  6.6  6.2   Total Bilirubin 0.0 - 1.2 mg/dL 0.7  0.8  0.8   Alkaline Phos 38 - 126 U/L 124  65  53   AST 15 - 41 U/L 48  20  14   ALT 0 - 44 U/L 87  21  18    MULTIPLE MYELOMA 11/2023 - 05/2024  PATHOLOGY Surgical Pathology CASE: [id number] PATIENT: Oluwaseun Garside Bone Marrow Report     Clinical History: Multiple Myeloma     DIAGNOSIS:  BONE MARROW, ASPIRATE, CLOT, CORE: -Hypercellular bone marrow (60%) with erythroid predominant trilineage hematopoiesis and no evidence of residual plasma cell neoplasm (less than 1% plasma cells by manual aspirate differential, and CD138 immunohistochemical analysis of clot and core sections).  PERIPHERAL BLOOD: -Leukopenia  MICROSCOPIC DESCRIPTION:  PERIPHERAL BLOOD SMEAR: Platelets: Adequate in number, no platelet clumps identified Erythroid: Normocytic  normochromic red blood cells Leukocytes: Mild leukopenia, negative for dysplastic granulocytes, blasts or  plasma cells  BONE MARROW ASPIRATE: Cellular Erythroid precursors: Relatively increased, show a full sequence of maturation with occasional abnormal forms (including binucleate forms, megaloblastoid change and nuclear membrane irregularities) Granulocytic precursors: Decreased, show a full sequence of generally orderly maturation Megakaryocytes: Normal in number and morphology Lymphocytes/plasma cells: Not increased    02/06/2022 Molecular pathology    RADIOGRAPHIC STUDIES: I have personally reviewed the radiological images as listed and agreed with the findings in the report. CT ABDOMEN PELVIS W CONTRAST Result Date: 06/03/2024 CLINICAL DATA:  Right lower quadrant pain radiating to right groin for 3 weeks. 1 month postop from cholecystectomy. Multiple myeloma. * Tracking Code: BO * EXAM: CT ABDOMEN AND PELVIS WITH CONTRAST TECHNIQUE: Multidetector CT imaging of the abdomen and pelvis was performed using the standard protocol following bolus administration of intravenous contrast. RADIATION DOSE REDUCTION: This exam was performed according to the departmental dose-optimization program which includes automated exposure control, adjustment of the mA and/or kV according to patient size and/or use of iterative reconstruction technique. CONTRAST:  100mL ISOVUE-300 IOPAMIDOL (ISOVUE-300) INJECTION 61% COMPARISON:  02/23/2022 FINDINGS: Lower Chest: No acute findings. Hepatobiliary: No suspicious hepatic masses identified. Surgical clips are seen from prior cholecystectomy. No evidence of biliary ductal dilatation. A small low-attenuation collection is seen in the gallbladder fossa which measures 3.2 x 1.2 cm, consistent with a postop fluid collection or biloma. Pancreas:  No mass or inflammatory changes. Spleen: Within normal limits in size and appearance. Adrenals/Urinary Tract: Stable 2.3 cm  low-attenuation right adrenal mass, consistent with benign adenoma. No suspicious renal masses identified. No evidence of ureteral calculi or hydronephrosis. Diffuse bladder wall thickening is seen, presumably due to chronic bladder outlet obstruction given enlarged prostate. Stomach/Bowel: Small hiatal hernia again seen. No evidence of obstruction, inflammatory process or abnormal fluid collections. Normal appendix visualized. Diverticulosis is seen mainly involving the sigmoid colon, however there is no evidence of diverticulitis. Vascular/Lymphatic: No pathologically enlarged lymph nodes. No acute vascular findings. Reproductive: Moderate to markedly enlarged prostate gland, increased since prior exam. Other:  Small right inguinal hernia, which contains only fat. Musculoskeletal: Numerous small lytic bone lesions throughout the visualized skeleton are again seen, with progression since prior study, consistent with multiple myeloma. No acute pathologic fractures identified. Prior vertebroplasties noted in the lower thoracic and lumbar spine. IMPRESSION: Status post cholecystectomy. Small postop fluid collection or biloma in gallbladder fossa. Nuclear medicine hepatobiliary study could be obtained to evaluate for bile leak if clinically warranted. Moderate to markedly enlarged prostate, increased since prior exam, with findings of chronic bladder outlet obstruction. Colonic diverticulosis, without radiographic evidence of diverticulitis. Small hiatal hernia. Small right inguinal hernia, which contains only fat. Interval progression of diffuse lytic bone lesions, consistent with multiple myeloma. Electronically Signed   By: Norleen DELENA Kil M.D.   On: 06/03/2024 16:42    ASSESSMENT & PLAN:    63 y.o. very pleasant male with   1.  R-ISS stage III multiple myeloma with extensive bone metastases and patholgiic fracture rt shoulder -Recent bone marrow biopsy and aspiration done 02/06/2022 revealed hypercellular bone  marrow with plasma cell neoplasm. Cytogenetics-normal karyotype Molecular cytogenetics-TP53 deletion and duplication of 1 q.SABRA   PLAN: - Discussed lab results on 06/18/2024 in detail with patient: CBC stable and CMP with some LFT elevations.  06/04/2024 M protein undetectable on Myeloma panel   Continue to hold Revlimid  in post surgical setting and with concerns for possible prostate cancer.   Continue Carfilzomib .  - Reviewed his 06/03/2024 CT A/P:  1. Status post cholecystectomy. Small postop fluid collection or biloma in gallbladder fossa. Nuclear medicine hepatobiliary study could be obtained to evaluate for bile leak if clinically warranted.  2. Moderate to markedly enlarged prostate, increased since prior exam, with findings of chronic bladder outlet obstruction.  Ureter obstruction possibly causing pain in testes.  3. Colonic diverticulosis, without radiographic evidence of diverticulitis.  4 Small hiatal hernia. Not bothersome enough to warrant repair currently. 5. Small right inguinal hernia, which contains only fat.  6. Interval progression of diffuse lytic bone lesions, consistent with multiple myeloma  2. Moderate to marked enlarged prostate + Increasing PSA levels  3. Progression of diffuse Bone lesions (with myeloma in remission will need to r/o bone metastases from prostate cancer) PLAN -discussed findings with patient. -discussed case with Dr Renda. -will order PSMA PET/CT to evaluate for metastatic prostate cancer in the context of increasing PSA levels >20 and new bone lesions. -if evidence of metastatic prostate cancer might need Bx of metastatic lesion for tissue bx. -if no evidence of metastatic disease from prostate cancer --- since there is suspicion for high grade/high gleasons score prostate cancer -- prostate biopsy would be reasonable. In this context though his myeloma has high risk genetics -- he appears to be in CR at this time and has been tolerating and  responding well to current dual maintenance therapy. The status of his prostate cancer would also play a role in determining the risks/benefits of restarting Revlimid  from a VTE risk standpoint and needing out prostate cancer specific interventions.     FOLLOW-UP: Per integrated scheduling continue maintenance Carfilzomib  every 2 weeks MD visit in 6-8 weeks   The total time spent in the appointment was 40 minutes* .  All of the patient's questions were answered and the patient knows to call the clinic with any problems, questions, or concerns.  Emaline Saran MD MS AAHIVMS [facility] Hematology/Oncology Physician [facility]  *Total Encounter Time as defined by the Centers for Medicare and Medicaid Services includes, in addition to the face-to-face time of a patient visit (documented in the note above) non-face-to-face time: obtaining and reviewing outside history, ordering and reviewing medications, tests or procedures, care coordination (communications with other health care professionals or caregivers) and documentation in the medical record.  I,Emily Lagle,acting as a neurosurgeon for Emaline Saran, MD.,have documented all relevant documentation on the behalf of Emaline Saran, MD,as directed by  Emaline Saran, MD while in the presence of Emaline Saran, MD.  I have reviewed the above documentation for accuracy and completeness, and I agree with the above.  Tyjae Issa, MD

## 2024-06-18 NOTE — Patient Instructions (Signed)
 [facility] - A DEPT OF [facility]   Discharge Instructions: Thank you for choosing [facility] to provide your oncology and hematology care.   If you have a lab appointment with the Cancer Center, please go directly to the Cancer Center and check in at the registration area.   Wear comfortable clothing and clothing appropriate for easy access to any Portacath or PICC line.   We strive to give you quality time with your provider. You may need to reschedule your appointment if you arrive late (15 or more minutes).  Arriving late affects you and other patients whose appointments are after yours.  Also, if you miss three or more appointments without notifying the office, you may be dismissed from the clinic at the provider's discretion.      For prescription refill requests, have your pharmacy contact our office and allow 72 hours for refills to be completed.    Today you received the following chemotherapy and/or immunotherapy agents: Carfilzomib (Kyprolis)      To help prevent nausea and vomiting after your treatment, we encourage you to take your nausea medication as directed.  BELOW ARE SYMPTOMS THAT SHOULD BE REPORTED IMMEDIATELY: *FEVER GREATER THAN 100.4 F (38 C) OR HIGHER *CHILLS OR SWEATING *NAUSEA AND VOMITING THAT IS NOT CONTROLLED WITH YOUR NAUSEA MEDICATION *UNUSUAL SHORTNESS OF BREATH *UNUSUAL BRUISING OR BLEEDING *URINARY PROBLEMS (pain or burning when urinating, or frequent urination) *BOWEL PROBLEMS (unusual diarrhea, constipation, pain near the anus) TENDERNESS IN MOUTH AND THROAT WITH OR WITHOUT PRESENCE OF ULCERS (sore throat, sores in mouth, or a toothache) UNUSUAL RASH, SWELLING OR PAIN  UNUSUAL VAGINAL DISCHARGE OR ITCHING   Items with * indicate a potential emergency and should be followed up as soon as possible or go to the Emergency Department if any problems should occur.  Please show the CHEMOTHERAPY ALERT CARD or  IMMUNOTHERAPY ALERT CARD at check-in to the Emergency Department and triage nurse.  Should you have questions after your visit or need to cancel or reschedule your appointment, please contact [facility] - A DEPT [facility]  Dept: [phone]  and follow the prompts.  Office hours are 8:00 a.m. to 4:30 p.m. Monday - Friday. Please note that voicemails left after 4:00 p.m. may not be returned until the following business day.  We are closed weekends and major holidays. You have access to a nurse at all times for urgent questions. Please call the main number to the clinic Dept: [phone] and follow the prompts.   For any non-urgent questions, you may also contact your provider using MyChart. We now offer e-Visits for anyone 55 and older to request care online for non-urgent symptoms. For details visit [email or web address].   Also download the MyChart app! Go to the app store, search "MyChart", open the app, select [place], and log in with your MyChart username and password.

## 2024-06-18 NOTE — Progress Notes (Signed)
 Nutrition Follow-up:  Patient with multiple myeloma. He is receiving kyprolis  + darzalex  q28d (start 04/07/22)   9/14-9/22 - [facility] admission with acute cholecystitis s/p cholecystectomy   Met with patient in infusion. He reports doing well. Patient has recovered from surgery, noting feeling more like his usual self in the last week. His appetite is good. Patient being mindful of foods high in fat. He has been reading food labels and has questions about healthy foods.    Medications: reviewed   Labs: reviewed   Anthropometrics: Wt 181 lb 8 oz today   10/17 - 179 lb 3.2 oz 9/5 - 185 lb 8 oz    NUTRITION DIAGNOSIS: Unintentional wt loss - improved    INTERVENTION:  Educated on AICR recommendations for plant-based diet, limiting intake of red meat and processed foods. Discussed benefits from eating variety of fruits/vegetables - handouts from AICR provided  Encourage lean protein sources at every meal    MONITORING, EVALUATION, GOAL: wt trends, intake    NEXT VISIT: Friday December 12 during infusion

## 2024-06-28 ENCOUNTER — Other Ambulatory Visit: Payer: Self-pay

## 2024-06-28 DIAGNOSIS — C9000 Multiple myeloma not having achieved remission: Secondary | ICD-10-CM

## 2024-06-29 ENCOUNTER — Other Ambulatory Visit: Payer: Self-pay | Admitting: Hematology

## 2024-06-29 DIAGNOSIS — C9000 Multiple myeloma not having achieved remission: Secondary | ICD-10-CM

## 2024-06-29 MED ORDER — FENTANYL 25 MCG/HR TD PT72: 1.0000 | MEDICATED_PATCH | TRANSDERMAL | 0 refills | Status: DC

## 2024-07-02 ENCOUNTER — Inpatient Hospital Stay

## 2024-07-02 ENCOUNTER — Inpatient Hospital Stay: Attending: Physician Assistant

## 2024-07-02 VITALS — BP 135/97 | HR 68 | Temp 98.6°F | Resp 18 | Wt 181.8 lb

## 2024-07-02 DIAGNOSIS — Z7901 Long term (current) use of anticoagulants: Secondary | ICD-10-CM | POA: Diagnosis not present

## 2024-07-02 DIAGNOSIS — C9000 Multiple myeloma not having achieved remission: Secondary | ICD-10-CM | POA: Insufficient documentation

## 2024-07-02 DIAGNOSIS — Z808 Family history of malignant neoplasm of other organs or systems: Secondary | ICD-10-CM | POA: Insufficient documentation

## 2024-07-02 DIAGNOSIS — Z809 Family history of malignant neoplasm, unspecified: Secondary | ICD-10-CM | POA: Diagnosis not present

## 2024-07-02 DIAGNOSIS — R059 Cough, unspecified: Secondary | ICD-10-CM | POA: Insufficient documentation

## 2024-07-02 DIAGNOSIS — Z7189 Other specified counseling: Secondary | ICD-10-CM

## 2024-07-02 DIAGNOSIS — R972 Elevated prostate specific antigen [PSA]: Secondary | ICD-10-CM | POA: Insufficient documentation

## 2024-07-02 DIAGNOSIS — Z79624 Long term (current) use of inhibitors of nucleotide synthesis: Secondary | ICD-10-CM | POA: Insufficient documentation

## 2024-07-02 DIAGNOSIS — Z7982 Long term (current) use of aspirin: Secondary | ICD-10-CM | POA: Diagnosis not present

## 2024-07-02 DIAGNOSIS — Z87891 Personal history of nicotine dependence: Secondary | ICD-10-CM | POA: Diagnosis not present

## 2024-07-02 DIAGNOSIS — M545 Low back pain, unspecified: Secondary | ICD-10-CM | POA: Diagnosis not present

## 2024-07-02 DIAGNOSIS — Z5112 Encounter for antineoplastic immunotherapy: Secondary | ICD-10-CM | POA: Insufficient documentation

## 2024-07-02 DIAGNOSIS — R0602 Shortness of breath: Secondary | ICD-10-CM | POA: Diagnosis not present

## 2024-07-02 LAB — CMP (CANCER CENTER ONLY)
ALT: 18 U/L (ref 0–44)
AST: 11 U/L — ABNORMAL LOW (ref 15–41)
Albumin: 4.4 g/dL (ref 3.5–5.0)
Alkaline Phosphatase: 73 U/L (ref 38–126)
Anion gap: 7 (ref 5–15)
BUN: 12 mg/dL (ref 8–23)
CO2: 24 mmol/L (ref 22–32)
Calcium: 9.5 mg/dL (ref 8.9–10.3)
Chloride: 106 mmol/L (ref 98–111)
Creatinine: 0.76 mg/dL (ref 0.61–1.24)
GFR, Estimated: >60 mL/min (ref >60)
Glucose, Bld: 92 mg/dL (ref 70–99)
Potassium: 4.2 mmol/L (ref 3.5–5.1)
Sodium: 137 mmol/L (ref 135–145)
Total Bilirubin: 0.6 mg/dL (ref 0.0–1.2)
Total Protein: 6.4 g/dL — ABNORMAL LOW (ref 6.5–8.1)

## 2024-07-02 LAB — CBC WITH DIFFERENTIAL (CANCER CENTER ONLY)
Abs Immature Granulocytes: 0.02 K/uL (ref 0.00–0.07)
Basophils Absolute: 0 K/uL (ref 0.0–0.1)
Basophils Relative: 0 %
Eosinophils Absolute: 0.1 K/uL (ref 0.0–0.5)
Eosinophils Relative: 2 %
HCT: 43.7 % (ref 39.0–52.0)
Hemoglobin: 15.2 g/dL (ref 13.0–17.0)
Immature Granulocytes: 1 %
Lymphocytes Relative: 25 %
Lymphs Abs: 1 K/uL (ref 0.7–4.0)
MCH: 29.1 pg (ref 26.0–34.0)
MCHC: 34.8 g/dL (ref 30.0–36.0)
MCV: 83.7 fL (ref 80.0–100.0)
Monocytes Absolute: 0.4 K/uL (ref 0.1–1.0)
Monocytes Relative: 11 %
Neutro Abs: 2.4 K/uL (ref 1.7–7.7)
Neutrophils Relative %: 61 %
Platelet Count: 209 K/uL (ref 150–400)
RBC: 5.22 MIL/uL (ref 4.22–5.81)
RDW: 14.3 % (ref 11.5–15.5)
WBC Count: 4 K/uL (ref 4.0–10.5)
nRBC: 0 % (ref 0.0–0.2)

## 2024-07-02 MED ORDER — DEXTROSE 5 % IV SOLN
70.0000 mg/m2 | Freq: Once | INTRAVENOUS | Status: AC
  Administered 2024-07-02: 140 mg via INTRAVENOUS
  Filled 2024-07-02: qty 60

## 2024-07-02 MED ORDER — ACETAMINOPHEN 325 MG PO TABS
650.0000 mg | ORAL_TABLET | Freq: Once | ORAL | Status: AC
  Administered 2024-07-02: 650 mg via ORAL
  Filled 2024-07-02: qty 2

## 2024-07-02 MED ORDER — ZOLEDRONIC ACID 4 MG/100ML IV SOLN
4.0000 mg | Freq: Once | INTRAVENOUS | Status: AC
  Administered 2024-07-02: 4 mg via INTRAVENOUS
  Filled 2024-07-02: qty 100

## 2024-07-02 MED ORDER — DEXAMETHASONE SODIUM PHOSPHATE 10 MG/ML IJ SOLN
8.0000 mg | Freq: Once | INTRAMUSCULAR | Status: AC
  Administered 2024-07-02: 8 mg via INTRAVENOUS

## 2024-07-02 MED ORDER — PALONOSETRON HCL INJECTION 0.25 MG/5ML
0.2500 mg | Freq: Once | INTRAVENOUS | Status: AC
  Administered 2024-07-02: 0.25 mg via INTRAVENOUS
  Filled 2024-07-02: qty 5

## 2024-07-02 MED ORDER — SODIUM CHLORIDE 0.9 % IV SOLN: Freq: Once | INTRAVENOUS | Status: AC

## 2024-07-02 NOTE — Patient Instructions (Signed)
 [facility] - A DEPT OF [facility]   Discharge Instructions: Thank you for choosing [facility] to provide your oncology and hematology care.   If you have a lab appointment with the Cancer Center, please go directly to the Cancer Center and check in at the registration area.   Wear comfortable clothing and clothing appropriate for easy access to any Portacath or PICC line.   We strive to give you quality time with your provider. You may need to reschedule your appointment if you arrive late (15 or more minutes).  Arriving late affects you and other patients whose appointments are after yours.  Also, if you miss three or more appointments without notifying the office, you may be dismissed from the clinic at the provider's discretion.      For prescription refill requests, have your pharmacy contact our office and allow 72 hours for refills to be completed.    Today you received the following chemotherapy and/or immunotherapy agents: Carfilzomib (Kyprolis)      To help prevent nausea and vomiting after your treatment, we encourage you to take your nausea medication as directed.  BELOW ARE SYMPTOMS THAT SHOULD BE REPORTED IMMEDIATELY: *FEVER GREATER THAN 100.4 F (38 C) OR HIGHER *CHILLS OR SWEATING *NAUSEA AND VOMITING THAT IS NOT CONTROLLED WITH YOUR NAUSEA MEDICATION *UNUSUAL SHORTNESS OF BREATH *UNUSUAL BRUISING OR BLEEDING *URINARY PROBLEMS (pain or burning when urinating, or frequent urination) *BOWEL PROBLEMS (unusual diarrhea, constipation, pain near the anus) TENDERNESS IN MOUTH AND THROAT WITH OR WITHOUT PRESENCE OF ULCERS (sore throat, sores in mouth, or a toothache) UNUSUAL RASH, SWELLING OR PAIN  UNUSUAL VAGINAL DISCHARGE OR ITCHING   Items with * indicate a potential emergency and should be followed up as soon as possible or go to the Emergency Department if any problems should occur.  Please show the CHEMOTHERAPY ALERT CARD or  IMMUNOTHERAPY ALERT CARD at check-in to the Emergency Department and triage nurse.  Should you have questions after your visit or need to cancel or reschedule your appointment, please contact [facility] - A DEPT [facility]  Dept: [phone]  and follow the prompts.  Office hours are 8:00 a.m. to 4:30 p.m. Monday - Friday. Please note that voicemails left after 4:00 p.m. may not be returned until the following business day.  We are closed weekends and major holidays. You have access to a nurse at all times for urgent questions. Please call the main number to the clinic Dept: [phone] and follow the prompts.   For any non-urgent questions, you may also contact your provider using MyChart. We now offer e-Visits for anyone 55 and older to request care online for non-urgent symptoms. For details visit [email or web address].   Also download the MyChart app! Go to the app store, search "MyChart", open the app, select [place], and log in with your MyChart username and password.

## 2024-07-04 ENCOUNTER — Encounter: Payer: Self-pay | Admitting: Hematology

## 2024-07-06 ENCOUNTER — Encounter (HOSPITAL_COMMUNITY)
Admission: RE | Admit: 2024-07-06 | Discharge: 2024-07-06 | Disposition: A | Discharge status: Home / Self Care | Source: Ambulatory Visit | Attending: Hematology | Admitting: Hematology

## 2024-07-06 DIAGNOSIS — C61 Malignant neoplasm of prostate: Secondary | ICD-10-CM | POA: Diagnosis present

## 2024-07-06 MED ORDER — FLOTUFOLASTAT F 18 GALLIUM 296-5846 MBQ/ML IV SOLN
8.3000 | Freq: Once | INTRAVENOUS | Status: DC
  Filled 2024-07-06: qty 9

## 2024-07-14 ENCOUNTER — Other Ambulatory Visit: Payer: Self-pay

## 2024-07-16 ENCOUNTER — Inpatient Hospital Stay

## 2024-07-16 ENCOUNTER — Inpatient Hospital Stay: Admitting: Physician Assistant

## 2024-07-16 VITALS — BP 141/95 | HR 69 | Temp 98.1°F | Resp 16 | Ht 63.0 in | Wt 181.5 lb

## 2024-07-16 DIAGNOSIS — Z5112 Encounter for antineoplastic immunotherapy: Secondary | ICD-10-CM | POA: Diagnosis not present

## 2024-07-16 DIAGNOSIS — Z7189 Other specified counseling: Secondary | ICD-10-CM

## 2024-07-16 DIAGNOSIS — C9000 Multiple myeloma not having achieved remission: Secondary | ICD-10-CM

## 2024-07-16 LAB — CBC WITH DIFFERENTIAL (CANCER CENTER ONLY)
Abs Immature Granulocytes: 0.01 K/uL (ref 0.00–0.07)
Basophils Absolute: 0 K/uL (ref 0.0–0.1)
Basophils Relative: 0 %
Eosinophils Absolute: 0.1 K/uL (ref 0.0–0.5)
Eosinophils Relative: 2 %
HCT: 44.3 % (ref 39.0–52.0)
Hemoglobin: 15.1 g/dL (ref 13.0–17.0)
Immature Granulocytes: 0 %
Lymphocytes Relative: 24 %
Lymphs Abs: 1 K/uL (ref 0.7–4.0)
MCH: 29.2 pg (ref 26.0–34.0)
MCHC: 34.1 g/dL (ref 30.0–36.0)
MCV: 85.7 fL (ref 80.0–100.0)
Monocytes Absolute: 0.5 K/uL (ref 0.1–1.0)
Monocytes Relative: 12 %
Neutro Abs: 2.5 K/uL (ref 1.7–7.7)
Neutrophils Relative %: 62 %
Platelet Count: 206 K/uL (ref 150–400)
RBC: 5.17 MIL/uL (ref 4.22–5.81)
RDW: 14 % (ref 11.5–15.5)
WBC Count: 4 K/uL (ref 4.0–10.5)
nRBC: 0 % (ref 0.0–0.2)

## 2024-07-16 LAB — CMP (CANCER CENTER ONLY)
ALT: 12 U/L (ref 0–44)
AST: 16 U/L (ref 15–41)
Albumin: 4.2 g/dL (ref 3.5–5.0)
Alkaline Phosphatase: 68 U/L (ref 38–126)
Anion gap: 11 (ref 5–15)
BUN: 15 mg/dL (ref 8–23)
CO2: 21 mmol/L — ABNORMAL LOW (ref 22–32)
Calcium: 9.2 mg/dL (ref 8.9–10.3)
Chloride: 106 mmol/L (ref 98–111)
Creatinine: 0.87 mg/dL (ref 0.61–1.24)
GFR, Estimated: >60 mL/min (ref >60)
Glucose, Bld: 88 mg/dL (ref 70–99)
Potassium: 4.2 mmol/L (ref 3.5–5.1)
Sodium: 138 mmol/L (ref 135–145)
Total Bilirubin: 0.7 mg/dL (ref 0.0–1.2)
Total Protein: 6.3 g/dL — ABNORMAL LOW (ref 6.5–8.1)

## 2024-07-16 MED ORDER — PALONOSETRON HCL INJECTION 0.25 MG/5ML
0.2500 mg | Freq: Once | INTRAVENOUS | Status: AC
  Administered 2024-07-16: 0.25 mg via INTRAVENOUS
  Filled 2024-07-16: qty 5

## 2024-07-16 MED ORDER — SODIUM CHLORIDE 0.9 % IV SOLN: Freq: Once | INTRAVENOUS | Status: AC

## 2024-07-16 MED ORDER — DEXAMETHASONE SODIUM PHOSPHATE 10 MG/ML IJ SOLN
8.0000 mg | Freq: Once | INTRAMUSCULAR | Status: AC
  Administered 2024-07-16: 8 mg via INTRAVENOUS

## 2024-07-16 MED ORDER — DEXTROSE 5 % IV SOLN
70.0000 mg/m2 | Freq: Once | INTRAVENOUS | Status: AC
  Administered 2024-07-16: 140 mg via INTRAVENOUS
  Filled 2024-07-16: qty 60

## 2024-07-16 NOTE — Patient Instructions (Signed)
 [facility] - A DEPT OF [facility]  Discharge Instructions: Thank you for choosing [facility] to provide your oncology and hematology care.   If you have a lab appointment with the Cancer Center, please go directly to the Cancer Center and check in at the registration area.   Wear comfortable clothing and clothing appropriate for easy access to any Portacath or PICC line.   We strive to give you quality time with your provider. You may need to reschedule your appointment if you arrive late (15 or more minutes).  Arriving late affects you and other patients whose appointments are after yours.  Also, if you miss three or more appointments without notifying the office, you may be dismissed from the clinic at the provider's discretion.      For prescription refill requests, have your pharmacy contact our office and allow 72 hours for refills to be completed.    Today you received the following chemotherapy and/or immunotherapy agents kyprolis      To help prevent nausea and vomiting after your treatment, we encourage you to take your nausea medication as directed.  BELOW ARE SYMPTOMS THAT SHOULD BE REPORTED IMMEDIATELY: *FEVER GREATER THAN 100.4 F (38 C) OR HIGHER *CHILLS OR SWEATING *NAUSEA AND VOMITING THAT IS NOT CONTROLLED WITH YOUR NAUSEA MEDICATION *UNUSUAL SHORTNESS OF BREATH *UNUSUAL BRUISING OR BLEEDING *URINARY PROBLEMS (pain or burning when urinating, or frequent urination) *BOWEL PROBLEMS (unusual diarrhea, constipation, pain near the anus) TENDERNESS IN MOUTH AND THROAT WITH OR WITHOUT PRESENCE OF ULCERS (sore throat, sores in mouth, or a toothache) UNUSUAL RASH, SWELLING OR PAIN  UNUSUAL VAGINAL DISCHARGE OR ITCHING   Items with * indicate a potential emergency and should be followed up as soon as possible or go to the Emergency Department if any problems should occur.  Please show the CHEMOTHERAPY ALERT CARD or IMMUNOTHERAPY  ALERT CARD at check-in to the Emergency Department and triage nurse.  Should you have questions after your visit or need to cancel or reschedule your appointment, please contact [facility] - A DEPT OF [facility]  Dept: [phone]  and follow the prompts.  Office hours are 8:00 a.m. to 4:30 p.m. Monday - Friday. Please note that voicemails left after 4:00 p.m. may not be returned until the following business day.  We are closed weekends and major holidays. You have access to a nurse at all times for urgent questions. Please call the main number to the clinic Dept: [phone] and follow the prompts.   For any non-urgent questions, you may also contact your provider using MyChart. We now offer e-Visits for anyone 80 and older to request care online for non-urgent symptoms. For details visit [email or web address].   Also download the MyChart app! Go to the app store, search "MyChart", open the app, select [place], and log in with your MyChart username and password.

## 2024-07-30 ENCOUNTER — Inpatient Hospital Stay: Attending: Physician Assistant

## 2024-07-30 ENCOUNTER — Other Ambulatory Visit: Payer: Self-pay

## 2024-07-30 ENCOUNTER — Inpatient Hospital Stay: Admitting: Dietician

## 2024-07-30 VITALS — BP 131/86 | HR 83 | Temp 97.8°F | Resp 18 | Wt 183.0 lb

## 2024-07-30 DIAGNOSIS — Z7901 Long term (current) use of anticoagulants: Secondary | ICD-10-CM | POA: Diagnosis not present

## 2024-07-30 DIAGNOSIS — Z79624 Long term (current) use of inhibitors of nucleotide synthesis: Secondary | ICD-10-CM | POA: Insufficient documentation

## 2024-07-30 DIAGNOSIS — Z7982 Long term (current) use of aspirin: Secondary | ICD-10-CM | POA: Diagnosis not present

## 2024-07-30 DIAGNOSIS — Z808 Family history of malignant neoplasm of other organs or systems: Secondary | ICD-10-CM | POA: Insufficient documentation

## 2024-07-30 DIAGNOSIS — C9000 Multiple myeloma not having achieved remission: Secondary | ICD-10-CM

## 2024-07-30 DIAGNOSIS — Z7189 Other specified counseling: Secondary | ICD-10-CM

## 2024-07-30 DIAGNOSIS — Z5112 Encounter for antineoplastic immunotherapy: Secondary | ICD-10-CM | POA: Diagnosis present

## 2024-07-30 DIAGNOSIS — Z809 Family history of malignant neoplasm, unspecified: Secondary | ICD-10-CM | POA: Insufficient documentation

## 2024-07-30 DIAGNOSIS — R972 Elevated prostate specific antigen [PSA]: Secondary | ICD-10-CM | POA: Insufficient documentation

## 2024-07-30 DIAGNOSIS — Z87891 Personal history of nicotine dependence: Secondary | ICD-10-CM | POA: Insufficient documentation

## 2024-07-30 DIAGNOSIS — M545 Low back pain, unspecified: Secondary | ICD-10-CM | POA: Insufficient documentation

## 2024-07-30 DIAGNOSIS — R110 Nausea: Secondary | ICD-10-CM | POA: Diagnosis not present

## 2024-07-30 LAB — CMP (CANCER CENTER ONLY)
ALT: 11 U/L (ref 0–44)
AST: 15 U/L (ref 15–41)
Albumin: 4.4 g/dL (ref 3.5–5.0)
Alkaline Phosphatase: 60 U/L (ref 38–126)
Anion gap: 10 (ref 5–15)
BUN: 12 mg/dL (ref 8–23)
CO2: 23 mmol/L (ref 22–32)
Calcium: 9.2 mg/dL (ref 8.9–10.3)
Chloride: 103 mmol/L (ref 98–111)
Creatinine: 0.85 mg/dL (ref 0.61–1.24)
GFR, Estimated: >60 mL/min (ref >60)
Glucose, Bld: 101 mg/dL — ABNORMAL HIGH (ref 70–99)
Potassium: 4.2 mmol/L (ref 3.5–5.1)
Sodium: 137 mmol/L (ref 135–145)
Total Bilirubin: 0.6 mg/dL (ref 0.0–1.2)
Total Protein: 6.5 g/dL (ref 6.5–8.1)

## 2024-07-30 LAB — CBC WITH DIFFERENTIAL (CANCER CENTER ONLY)
Abs Immature Granulocytes: 0.01 K/uL (ref 0.00–0.07)
Basophils Absolute: 0 K/uL (ref 0.0–0.1)
Basophils Relative: 1 %
Eosinophils Absolute: 0.1 K/uL (ref 0.0–0.5)
Eosinophils Relative: 2 %
HCT: 44 % (ref 39.0–52.0)
Hemoglobin: 15.1 g/dL (ref 13.0–17.0)
Immature Granulocytes: 0 %
Lymphocytes Relative: 23 %
Lymphs Abs: 1 K/uL (ref 0.7–4.0)
MCH: 29.2 pg (ref 26.0–34.0)
MCHC: 34.3 g/dL (ref 30.0–36.0)
MCV: 85.1 fL (ref 80.0–100.0)
Monocytes Absolute: 0.6 K/uL (ref 0.1–1.0)
Monocytes Relative: 13 %
Neutro Abs: 2.7 K/uL (ref 1.7–7.7)
Neutrophils Relative %: 61 %
Platelet Count: 202 K/uL (ref 150–400)
RBC: 5.17 MIL/uL (ref 4.22–5.81)
RDW: 13.8 % (ref 11.5–15.5)
WBC Count: 4.3 K/uL (ref 4.0–10.5)
nRBC: 0 % (ref 0.0–0.2)

## 2024-07-30 MED ORDER — PALONOSETRON HCL INJECTION 0.25 MG/5ML
0.2500 mg | Freq: Once | INTRAVENOUS | Status: AC
  Administered 2024-07-30: 0.25 mg via INTRAVENOUS
  Filled 2024-07-30: qty 5

## 2024-07-30 MED ORDER — ACETAMINOPHEN 325 MG PO TABS
650.0000 mg | ORAL_TABLET | Freq: Once | ORAL | Status: AC
  Administered 2024-07-30: 650 mg via ORAL
  Filled 2024-07-30: qty 2

## 2024-07-30 MED ORDER — DEXTROSE 5 % IV SOLN
70.0000 mg/m2 | Freq: Once | INTRAVENOUS | Status: AC
  Administered 2024-07-30: 140 mg via INTRAVENOUS
  Filled 2024-07-30: qty 60

## 2024-07-30 MED ORDER — DEXAMETHASONE SODIUM PHOSPHATE 10 MG/ML IJ SOLN
8.0000 mg | Freq: Once | INTRAMUSCULAR | Status: AC
  Administered 2024-07-30: 8 mg via INTRAVENOUS

## 2024-07-30 MED ORDER — SODIUM CHLORIDE 0.9 % IV SOLN: Freq: Once | INTRAVENOUS | Status: AC

## 2024-07-30 MED ORDER — ZOLEDRONIC ACID 4 MG/100ML IV SOLN
4.0000 mg | Freq: Once | INTRAVENOUS | Status: DC
  Filled 2024-07-30: qty 100

## 2024-07-30 NOTE — Progress Notes (Signed)
 Nutrition Follow-up:  Patient with multiple myeloma. He is receiving kyprolis  + darzalex  q28d (start 04/07/22)   9/14-9/22 - [facility] admission with acute cholecystitis s/p cholecystectomy   Met with patient in infusion. He is doing well today. Having some nausea at visit which he says is anticipatory nausea as he knows he will start feeling bad in a few hours. Patient took compazine  this morning at 10AM and has another in pocket to take at [facility]. He will receive IV antiemetics as well. Patient reports good appetite. Has been eating smaller meals more often. Drinks protein shake occasionally when dietary protein is low. Patient has been feeling good and is trying to be more active. Trying to incorporate more plant-based foods, however this is expensive.    Medications: reviewed   Labs: reviewed  Anthropometrics: Wt 183 lb today   11/28 - 181 lb 8 oz 10/31 - 181 lb 8 oz  9/5 - 185 lb 8 oz   NUTRITION DIAGNOSIS: Unintentional wt loss - improved    INTERVENTION:  Continue small frequent meals and including good sources of lean proteins  Encourage activity as able   MONITORING, EVALUATION, GOAL: wt trends, intake   NEXT VISIT: To be scheduled as needed

## 2024-07-30 NOTE — Patient Instructions (Signed)
 [facility] - A DEPT OF [facility]  Discharge Instructions: Thank you for choosing [facility] to provide your oncology and hematology care.   If you have a lab appointment with the Cancer Center, please go directly to the Cancer Center and check in at the registration area.   Wear comfortable clothing and clothing appropriate for easy access to any Portacath or PICC line.   We strive to give you quality time with your provider. You may need to reschedule your appointment if you arrive late (15 or more minutes).  Arriving late affects you and other patients whose appointments are after yours.  Also, if you miss three or more appointments without notifying the office, you may be dismissed from the clinic at the provider's discretion.      For prescription refill requests, have your pharmacy contact our office and allow 72 hours for refills to be completed.    Today you received the following chemotherapy and/or immunotherapy agents kyprolis      To help prevent nausea and vomiting after your treatment, we encourage you to take your nausea medication as directed.  BELOW ARE SYMPTOMS THAT SHOULD BE REPORTED IMMEDIATELY: *FEVER GREATER THAN 100.4 F (38 C) OR HIGHER *CHILLS OR SWEATING *NAUSEA AND VOMITING THAT IS NOT CONTROLLED WITH YOUR NAUSEA MEDICATION *UNUSUAL SHORTNESS OF BREATH *UNUSUAL BRUISING OR BLEEDING *URINARY PROBLEMS (pain or burning when urinating, or frequent urination) *BOWEL PROBLEMS (unusual diarrhea, constipation, pain near the anus) TENDERNESS IN MOUTH AND THROAT WITH OR WITHOUT PRESENCE OF ULCERS (sore throat, sores in mouth, or a toothache) UNUSUAL RASH, SWELLING OR PAIN  UNUSUAL VAGINAL DISCHARGE OR ITCHING   Items with * indicate a potential emergency and should be followed up as soon as possible or go to the Emergency Department if any problems should occur.  Please show the CHEMOTHERAPY ALERT CARD or IMMUNOTHERAPY  ALERT CARD at check-in to the Emergency Department and triage nurse.  Should you have questions after your visit or need to cancel or reschedule your appointment, please contact [facility] - A DEPT OF [facility]  Dept: [phone]  and follow the prompts.  Office hours are 8:00 a.m. to 4:30 p.m. Monday - Friday. Please note that voicemails left after 4:00 p.m. may not be returned until the following business day.  We are closed weekends and major holidays. You have access to a nurse at all times for urgent questions. Please call the main number to the clinic Dept: [phone] and follow the prompts.   For any non-urgent questions, you may also contact your provider using MyChart. We now offer e-Visits for anyone 80 and older to request care online for non-urgent symptoms. For details visit [email or web address].   Also download the MyChart app! Go to the app store, search "MyChart", open the app, select [place], and log in with your MyChart username and password.

## 2024-07-30 NOTE — Progress Notes (Signed)
 Pt declined zometa  after feeling nauseated after his kyprolis . He reports he would like to have it on 12/26 instead. Dr. Onesimo aware.

## 2024-08-01 ENCOUNTER — Other Ambulatory Visit: Payer: Self-pay

## 2024-08-01 ENCOUNTER — Encounter: Payer: Self-pay | Admitting: Hematology

## 2024-08-01 MED ORDER — LORAZEPAM 0.5 MG PO TABS: 0.5000 mg | ORAL_TABLET | Freq: Four times a day (QID) | ORAL | 0 refills | Status: AC | PRN

## 2024-08-01 MED ORDER — FENTANYL 25 MCG/HR TD PT72: 1.0000 | MEDICATED_PATCH | TRANSDERMAL | 0 refills | Status: DC

## 2024-08-03 LAB — MULTIPLE MYELOMA PANEL, SERUM
Albumin SerPl Elph-Mcnc: 3.7 g/dL (ref 2.9–4.4)
Albumin/Glob SerPl: 1.7 (ref 0.7–1.7)
Alpha 1: 0.2 g/dL (ref 0.0–0.4)
Alpha2 Glob SerPl Elph-Mcnc: 0.6 g/dL (ref 0.4–1.0)
B-Globulin SerPl Elph-Mcnc: 0.9 g/dL (ref 0.7–1.3)
Gamma Glob SerPl Elph-Mcnc: 0.5 g/dL (ref 0.4–1.8)
Globulin, Total: 2.2 g/dL (ref 2.2–3.9)
IgA: 26 mg/dL — ABNORMAL LOW (ref 61–437)
IgG (Immunoglobin G), Serum: 521 mg/dL — ABNORMAL LOW (ref 603–1613)
IgM (Immunoglobulin M), Srm: 28 mg/dL (ref 20–172)
Total Protein ELP: 5.9 g/dL — ABNORMAL LOW (ref 6.0–8.5)

## 2024-08-11 ENCOUNTER — Inpatient Hospital Stay: Admitting: Physician Assistant

## 2024-08-11 ENCOUNTER — Inpatient Hospital Stay

## 2024-08-11 VITALS — BP 135/88 | HR 65 | Temp 97.5°F | Resp 18 | Wt 186.9 lb

## 2024-08-11 DIAGNOSIS — Z7189 Other specified counseling: Secondary | ICD-10-CM

## 2024-08-11 DIAGNOSIS — C9000 Multiple myeloma not having achieved remission: Secondary | ICD-10-CM

## 2024-08-11 DIAGNOSIS — Z5112 Encounter for antineoplastic immunotherapy: Secondary | ICD-10-CM | POA: Diagnosis not present

## 2024-08-11 LAB — CMP (CANCER CENTER ONLY)
ALT: 12 U/L (ref 0–44)
AST: 17 U/L (ref 15–41)
Albumin: 4.1 g/dL (ref 3.5–5.0)
Alkaline Phosphatase: 53 U/L (ref 38–126)
Anion gap: 10 (ref 5–15)
BUN: 16 mg/dL (ref 8–23)
CO2: 23 mmol/L (ref 22–32)
Calcium: 8.9 mg/dL (ref 8.9–10.3)
Chloride: 104 mmol/L (ref 98–111)
Creatinine: 0.98 mg/dL (ref 0.61–1.24)
GFR, Estimated: >60 mL/min
Glucose, Bld: 109 mg/dL — ABNORMAL HIGH (ref 70–99)
Potassium: 4.1 mmol/L (ref 3.5–5.1)
Sodium: 136 mmol/L (ref 135–145)
Total Bilirubin: 0.8 mg/dL (ref 0.0–1.2)
Total Protein: 6 g/dL — ABNORMAL LOW (ref 6.5–8.1)

## 2024-08-11 LAB — CBC WITH DIFFERENTIAL (CANCER CENTER ONLY)
Abs Immature Granulocytes: 0.04 K/uL (ref 0.00–0.07)
Basophils Absolute: 0 K/uL (ref 0.0–0.1)
Basophils Relative: 0 %
Eosinophils Absolute: 0.1 K/uL (ref 0.0–0.5)
Eosinophils Relative: 4 %
HCT: 42.5 % (ref 39.0–52.0)
Hemoglobin: 14.5 g/dL (ref 13.0–17.0)
Immature Granulocytes: 1 %
Lymphocytes Relative: 23 %
Lymphs Abs: 0.8 K/uL (ref 0.7–4.0)
MCH: 29 pg (ref 26.0–34.0)
MCHC: 34.1 g/dL (ref 30.0–36.0)
MCV: 85 fL (ref 80.0–100.0)
Monocytes Absolute: 0.4 K/uL (ref 0.1–1.0)
Monocytes Relative: 12 %
Neutro Abs: 2.2 K/uL (ref 1.7–7.7)
Neutrophils Relative %: 60 %
Platelet Count: 182 K/uL (ref 150–400)
RBC: 5 MIL/uL (ref 4.22–5.81)
RDW: 13.5 % (ref 11.5–15.5)
WBC Count: 3.6 K/uL — ABNORMAL LOW (ref 4.0–10.5)
nRBC: 0 % (ref 0.0–0.2)

## 2024-08-11 NOTE — Progress Notes (Signed)
 "   HEMATOLOGY/ONCOLOGY CLINIC NOTE  Date of Service: 08/11/2024   Patient Care Team: Clinic, [facility] Lien as PCP - General  CHIEF COMPLAINTS/PURPOSE OF CONSULTATION:  Multiple myeloma  INTERVAL HISTORY:  Anthony [name] is a 63 y.o. male who presents for a follow up visit for continued management of multiple myeloma. He was last seen by Dr. Onesimo on 06/18/2024. He presents today before Cycle 29,  Day 15 of Carfilzomib /Dexamethasone  treatment.  Anthony [name] reports overall he is doing well without any significant changes to his health.  He reports that he is easily fatigued and stamina has declined over time. He is able to complete his ADLs on his own. His appetite and weight are overall stable. He continues to have nausea following treatment which can last a couple of days. He is taking his prescribed antiemetics that controls his symptoms. He denies any vomiting episodes. His bowel habits are unchanged with occasional episodes of constipation versus diarrhea that he suspects could be food related. He denies easily bruising or overt signs of bleeding. He denies fevers, chills, sweats, shortness of breath, chest pain or cough. He has no other complaints.   MEDICAL HISTORY:  Past Medical History:  Diagnosis Date   Back pain    Cancer (HCC)    Multiple myeloma (HCC) 02/15/2022    SURGICAL HISTORY: Past Surgical History:  Procedure Laterality Date   IR BONE TUMOR(S)RF ABLATION  05/07/2022   IR BONE TUMOR(S)RF ABLATION  05/07/2022   IR BONE TUMOR(S)RF ABLATION  05/07/2022   IR KYPHO EA ADDL LEVEL THORACIC OR LUMBAR  05/07/2022   IR KYPHO LUMBAR INC FX REDUCE BONE BX UNI/BIL CANNULATION INC/IMAGING  05/07/2022   IR KYPHO THORACIC WITH BONE BIOPSY  05/07/2022   IR RADIOLOGIST EVAL & MGMT  04/23/2022   IR RADIOLOGIST EVAL & MGMT  05/17/2022   IR RADIOLOGIST EVAL & MGMT  06/06/2022    SOCIAL HISTORY: Social History   Socioeconomic History   Marital status: Married    Spouse name: Not on  file   Number of children: Not on file   Years of education: Not on file   Highest education level: Not on file  Occupational History   Not on file  Tobacco Use   Smoking status: Never   Smokeless tobacco: Former    Types: Chew, Snuff  Vaping Use   Vaping status: Never Used  Substance and Sexual Activity   Alcohol use: Yes    Alcohol/week: 2.0 standard drinks of alcohol    Types: 2 Glasses of wine per week   Drug use: No   Sexual activity: Yes  Other Topics Concern   Not on file  Social History Narrative   Not on file   Social Drivers of Health   Tobacco Use: Medium Risk (05/04/2024)   [facility] from [facility]   Patient History    Smoking Tobacco Use: Former    Smokeless Tobacco Use: Former    Passive Exposure: Not on Actuary Strain: Low Risk (05/07/2022)   Overall Financial Resource Strain (CARDIA)    Difficulty of Paying Living Expenses: Not very hard  Food Insecurity: No Food Insecurity (05/07/2022)   Hunger Vital Sign    Worried About Running Out of Food in the Last Year: Never true    Ran Out of Food in the Last Year: Never true  Transportation Needs: No Transportation Needs (05/07/2022)   PRAPARE - Administrator, Civil Service (Medical): No  Lack of Transportation (Non-Medical): No  Physical Activity: Not on file  Stress: Not on file  Social Connections: Not on file  Intimate Partner Violence: Unknown (05/07/2022)   Humiliation, Afraid, Rape, and Kick questionnaire    Fear of Current or Ex-Partner: No    Emotionally Abused: No    Physically Abused: No    Sexually Abused: Not on file  Depression (PHQ2-9): Low Risk (08/11/2024)   Depression (PHQ2-9)    PHQ-2 Score: 0  Alcohol Screen: Not on file  Housing: Low Risk (05/07/2022)   Housing    Last Housing Risk Score: 0  Utilities: Not At Risk (05/07/2022)   AHC Utilities    Threatened with loss of utilities: No  Health Literacy: Not on file    FAMILY  HISTORY: Family History  Problem Relation Age of Onset   Rheum arthritis Mother    Other Sister        Pre-cancerous uterine mass;    Rheum arthritis Sister    Bone cancer Paternal Grandfather    Brain cancer Maternal Uncle    Brain cancer Maternal Aunt    Osteoporosis Neg Hx     ALLERGIES:  has no known allergies.  MEDICATIONS:  Current Outpatient Medications  Medication Sig Dispense Refill   acyclovir  (ZOVIRAX ) 400 MG tablet Take 1 tablet (400 mg total) by mouth 2 (two) times daily. 10 tablet 0   aluminum chloride (DRYSOL) 20 % external solution Apply 1 application topically at bedtime.     apixaban  (ELIQUIS ) 5 MG TABS tablet Take 1 tablet (5 mg total) by mouth 2 (two) times daily. 10 tablet 0   Calcium Citrate-Vitamin D  250-2.5 MG-MCG TABS Take 1 tablet by mouth every morning.     calcium-vitamin D  (OSCAL WITH D) 250-125 MG-UNIT tablet Take 1 tablet by mouth daily.     cyanocobalamin  (VITAMIN B12) 1000 MCG tablet Take 1 tablet (1,000 mcg total) by mouth daily. 30 tablet 3   cyclobenzaprine (FLEXERIL) 10 MG tablet Take 10 mg by mouth 3 (three) times daily as needed for muscle spasms. (Patient not taking: Reported on 04/23/2024)     diclofenac sodium (VOLTAREN) 1 % GEL Apply 2 g topically as needed.     dronabinol  (MARINOL ) 10 MG capsule Take 1 capsule (10 mg total) by mouth 2 (two) times daily before a meal. (Patient not taking: Reported on 04/23/2024) 60 capsule 0   ergocalciferol  (VITAMIN D2) 1.25 MG (50000 UT) capsule Take 1 capsule (50,000 Units total) by mouth 2 (two) times a week. 12 capsule 2   escitalopram (LEXAPRO) 10 MG tablet Take 10 mg by mouth daily. (Patient not taking: Reported on 04/23/2024)     esomeprazole  (NEXIUM ) 40 MG capsule Take 1 capsule (40 mg total) by mouth daily. Take 1 capsule (40 mg total) daily before breakfast 30 capsule 3   famotidine  (PEPCID ) 40 MG tablet Take 40 mg by mouth daily.     fentaNYL  (DURAGESIC ) 25 MCG/HR Place 1 patch onto the skin every 3  (three) days. 10 patch 0   lidocaine  (LIDODERM ) 5 % Place 1 patch onto the skin daily. Remove & Discard patch within 12 hours or as directed by MD 30 patch 0   loratadine (CLARITIN) 10 MG tablet Take 10 mg by mouth daily.     LORazepam  (ATIVAN ) 0.5 MG tablet Take 1 tablet (0.5 mg total) by mouth every 6 (six) hours as needed (Nausea or vomiting). 30 tablet 0   losartan (COZAAR) 50 MG tablet Take 25 mg by  mouth.     methocarbamol  (ROBAXIN ) 500 MG tablet Take 1 tablet (500 mg total) by mouth every 8 (eight) hours as needed for muscle spasms. 30 tablet 0   metroNIDAZOLE  (METROGEL ) 1 % gel Apply topically daily. 45 g 0   Multiple Vitamin (MULTIVITAMIN) tablet Take 1 tablet by mouth daily.     Omega-3 Fatty Acids (FISH OIL) 1360 MG CAPS Take 1 capsule by mouth daily.     ondansetron  (ZOFRAN ) 8 MG tablet Take 1 tablet (8 mg total) by mouth every 8 (eight) hours as needed for nausea or vomiting. 30 tablet 0   oxyCODONE  (ROXICODONE ) 5 MG immediate release tablet Take 1 tablet (5 mg total) by mouth every 4 (four) hours as needed for severe pain (pain score 7-10) or moderate pain (pain score 4-6). Take 1 to 2 tablets every 4 hours as needed for moderate to severe pain. 60 tablet 0   pantoprazole  (PROTONIX ) 40 MG tablet Take 40 mg by mouth daily. (Patient not taking: Reported on 08/11/2024)     prochlorperazine  (COMPAZINE ) 10 MG tablet TAKE ONE TABLET BY MOUTH EVERY 6 HOURS AS NEEDED FOR NAUSEA AND VOMITING 30 tablet 1   REVLIMID  15 MG capsule Take 1 capsule (15 mg total) by mouth daily for 21 days and then take 7 days off. Repeat cycle. 21 capsule 0   senna-docusate (SENNA S) 8.6-50 MG tablet Take 2 tablets by mouth at bedtime. (Patient not taking: Reported on 04/23/2024) 60 tablet 1   terbinafine (LAMISIL) 1 % cream Apply 1 application topically 2 (two) times daily.     No current facility-administered medications for this visit.    10 Point review of Systems was done is negative except as noted  above.  PHYSICAL EXAMINATION: Vitals:   08/11/24 1034  BP: 135/88  Pulse: 65  Resp: 18  Temp: (!) 97.5 F (36.4 C)  SpO2: 98%   NAD GENERAL:alert, in no acute distress and comfortable SKIN: no acute rashes, no significant lesions EYES: conjunctiva are pink and non-injected, sclera anicteric NECK: supple, no JVD LUNGS: clear to auscultation b/l with normal respiratory effort HEART: regular rate & rhythm Extremity: no pedal edema PSYCH: alert & oriented x 3 with fluent speech NEURO: no focal motor/sensory deficits    LABORATORY DATA:  I have reviewed the data as listed .    Latest Ref Rng & Units 08/11/2024   10:16 AM 07/30/2024    1:54 PM 07/16/2024    9:45 AM  CBC  WBC 4.0 - 10.5 K/uL 3.6  4.3  4.0   Hemoglobin 13.0 - 17.0 g/dL 85.4  84.8  84.8   Hematocrit 39.0 - 52.0 % 42.5  44.0  44.3   Platelets 150 - 400 K/uL 182  202  206       Latest Ref Rng & Units 08/11/2024   10:16 AM 07/30/2024    1:54 PM 07/16/2024    9:45 AM  CMP  Glucose 70 - 99 mg/dL 890  898  88   BUN 8 - 23 mg/dL 16  12  15    Creatinine 0.61 - 1.24 mg/dL 9.01  9.14  9.12   Sodium 135 - 145 mmol/L 136  137  138   Potassium 3.5 - 5.1 mmol/L 4.1  4.2  4.2   Chloride 98 - 111 mmol/L 104  103  106   CO2 22 - 32 mmol/L 23  23  21    Calcium 8.9 - 10.3 mg/dL 8.9  9.2  9.2  Total Protein 6.5 - 8.1 g/dL 6.0  6.5  6.3   Total Bilirubin 0.0 - 1.2 mg/dL 0.8  0.6  0.7   Alkaline Phos 38 - 126 U/L 53  60  68   AST 15 - 41 U/L 17  15  16    ALT 0 - 44 U/L 12  11  12      02/06/2022 Molecular pathology   RADIOGRAPHIC STUDIES: I have personally reviewed the radiological images as listed and agreed with the findings in the report. No results found.   ASSESSMENT & PLAN:  PREET PERRIER is a 63 y.o. very pleasant male with multiple myeloma.   1. R-ISS stage III multiple myeloma with extensive bone metastases and patholgiic fracture rt shoulder -Bone marrow biopsy and aspiration done 02/06/2022  revealed hypercellular bone marrow with plasma cell neoplasm. 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  Plan -Labs done today were reviewed in detail and adequate for treatment.  WBC 3.6, hemoglobin 14.5, platelet 182, creatinine and LFTs in range. -Most recent myeloma panel from 07/30/2024 shows M protein is not detectable. -No significant new toxicities from his current treatment regimen -Continue carfilzomib /dexamethasone  regimen. Added Emend to his pre-med due to ongoing nausea after treatment.  -Revlimid  has been on hold since patient underwent cholecystectomy with a perforated gallbladder and peritonitis. Will follow up with Dr. Onesimo on when/if to resume Revlimid  therapy and if to wait until he undergoes prostate biopsy.  -Reviewed PET PSMA scan from 07/06/2024 that showed no evidence of metastatic prostate cancer.  -Continue monthly Zometa , last dose was 07/02/2024.  -Continue a Eliquis  for VTE prophylaxis -Continue acyclovir  for VZV prophylaxis  Follow up: RTC in 2 weeks with labs and Carfilzomib /Dex treatment.  RTC in 4 weeks for labs and follow up visit with Dr. Onesimo before Carfilzomib /Dex + Zometa  treatment  All of the patient's questions were answered with apparent satisfaction. The patient knows to call the clinic with any problems, questions or concerns.  I have spent a total of 30 minutes minutes of face-to-face and non-face-to-face time, preparing to see the patient, performing a medically appropriate examination, counseling and educating the patient,documenting clinical information in the electronic health record,and care coordination.   Johnston Police PA-C Dept of Hematology and Oncology [facility] at [facility] Phone: [phone]    "

## 2024-08-12 ENCOUNTER — Other Ambulatory Visit: Payer: Self-pay

## 2024-08-13 ENCOUNTER — Inpatient Hospital Stay

## 2024-08-13 ENCOUNTER — Inpatient Hospital Stay: Admitting: Physician Assistant

## 2024-08-13 VITALS — BP 147/83 | Temp 97.6°F | Resp 18

## 2024-08-13 DIAGNOSIS — Z7189 Other specified counseling: Secondary | ICD-10-CM

## 2024-08-13 DIAGNOSIS — C9000 Multiple myeloma not having achieved remission: Secondary | ICD-10-CM

## 2024-08-13 DIAGNOSIS — Z5112 Encounter for antineoplastic immunotherapy: Secondary | ICD-10-CM | POA: Diagnosis not present

## 2024-08-13 MED ORDER — ZOLEDRONIC ACID 4 MG/100ML IV SOLN
4.0000 mg | Freq: Once | INTRAVENOUS | Status: AC
  Administered 2024-08-13: 4 mg via INTRAVENOUS
  Filled 2024-08-13: qty 100

## 2024-08-13 MED ORDER — DEXAMETHASONE SODIUM PHOSPHATE 10 MG/ML IJ SOLN
8.0000 mg | Freq: Once | INTRAMUSCULAR | Status: AC
  Administered 2024-08-13: 8 mg via INTRAVENOUS

## 2024-08-13 MED ORDER — DEXTROSE 5 % IV SOLN
70.0000 mg/m2 | Freq: Once | INTRAVENOUS | Status: AC
  Administered 2024-08-13: 140 mg via INTRAVENOUS
  Filled 2024-08-13: qty 50

## 2024-08-13 MED ORDER — SODIUM CHLORIDE 0.9 % IV SOLN: Freq: Once | INTRAVENOUS | Status: AC

## 2024-08-13 MED ORDER — PALONOSETRON HCL INJECTION 0.25 MG/5ML
0.2500 mg | Freq: Once | INTRAVENOUS | Status: AC
  Administered 2024-08-13: 0.25 mg via INTRAVENOUS
  Filled 2024-08-13: qty 5

## 2024-08-13 MED ORDER — SODIUM CHLORIDE 0.9 % IV SOLN
150.0000 mg | Freq: Once | INTRAVENOUS | Status: AC
  Administered 2024-08-13: 150 mg via INTRAVENOUS
  Filled 2024-08-13: qty 150

## 2024-08-13 NOTE — Patient Instructions (Signed)
 [facility] - A DEPT OF [facility]   Discharge Instructions: Thank you for choosing [facility] to provide your oncology and hematology care.   If you have a lab appointment with the Cancer Center, please go directly to the Cancer Center and check in at the registration area.   Wear comfortable clothing and clothing appropriate for easy access to any Portacath or PICC line.   We strive to give you quality time with your provider. You may need to reschedule your appointment if you arrive late (15 or more minutes).  Arriving late affects you and other patients whose appointments are after yours.  Also, if you miss three or more appointments without notifying the office, you may be dismissed from the clinic at the providers discretion.      For prescription refill requests, have your pharmacy contact our office and allow 72 hours for refills to be completed.    Today you received the following chemotherapy and/or immunotherapy agents: Carfilzomib  (Kyprolis )  You also received Zometa .     To help prevent nausea and vomiting after your treatment, we encourage you to take your nausea medication as directed.  BELOW ARE SYMPTOMS THAT SHOULD BE REPORTED IMMEDIATELY: *FEVER GREATER THAN 100.4 F (38 C) OR HIGHER *CHILLS OR SWEATING *NAUSEA AND VOMITING THAT IS NOT CONTROLLED WITH YOUR NAUSEA MEDICATION *UNUSUAL SHORTNESS OF BREATH *UNUSUAL BRUISING OR BLEEDING *URINARY PROBLEMS (pain or burning when urinating, or frequent urination) *BOWEL PROBLEMS (unusual diarrhea, constipation, pain near the anus) TENDERNESS IN MOUTH AND THROAT WITH OR WITHOUT PRESENCE OF ULCERS (sore throat, sores in mouth, or a toothache) UNUSUAL RASH, SWELLING OR PAIN  UNUSUAL VAGINAL DISCHARGE OR ITCHING   Items with * indicate a potential emergency and should be followed up as soon as possible or go to the Emergency Department if any problems should occur.  Please show the  CHEMOTHERAPY ALERT CARD or IMMUNOTHERAPY ALERT CARD at check-in to the Emergency Department and triage nurse.  Should you have questions after your visit or need to cancel or reschedule your appointment, please contact [facility] - A DEPT OF [facility]  Dept: [phone]  and follow the prompts.  Office hours are 8:00 a.m. to 4:30 p.m. Monday - Friday. Please note that voicemails left after 4:00 p.m. may not be returned until the following business day.  We are closed weekends and major holidays. You have access to a nurse at all times for urgent questions. Please call the main number to the clinic Dept: [phone] and follow the prompts.   For any non-urgent questions, you may also contact your provider using MyChart. We now offer e-Visits for anyone 23 and older to request care online for non-urgent symptoms. For details visit [email or web address].   Also download the MyChart app! Go to the app store, search MyChart, open the app, select [place], and log in with your MyChart username and password.

## 2024-08-15 ENCOUNTER — Encounter: Payer: Self-pay | Admitting: Hematology

## 2024-08-24 ENCOUNTER — Other Ambulatory Visit: Payer: Self-pay

## 2024-08-27 ENCOUNTER — Inpatient Hospital Stay: Attending: Physician Assistant

## 2024-08-27 ENCOUNTER — Inpatient Hospital Stay

## 2024-08-27 ENCOUNTER — Other Ambulatory Visit: Payer: Self-pay | Admitting: Hematology

## 2024-08-27 ENCOUNTER — Other Ambulatory Visit: Payer: Self-pay | Admitting: Physician Assistant

## 2024-08-27 VITALS — BP 143/87 | HR 70 | Temp 97.7°F | Resp 16 | Ht 63.0 in | Wt 184.5 lb

## 2024-08-27 DIAGNOSIS — R972 Elevated prostate specific antigen [PSA]: Secondary | ICD-10-CM | POA: Diagnosis not present

## 2024-08-27 DIAGNOSIS — Z7901 Long term (current) use of anticoagulants: Secondary | ICD-10-CM | POA: Diagnosis not present

## 2024-08-27 DIAGNOSIS — Z7189 Other specified counseling: Secondary | ICD-10-CM

## 2024-08-27 DIAGNOSIS — C9000 Multiple myeloma not having achieved remission: Secondary | ICD-10-CM

## 2024-08-27 DIAGNOSIS — Z79624 Long term (current) use of inhibitors of nucleotide synthesis: Secondary | ICD-10-CM | POA: Insufficient documentation

## 2024-08-27 DIAGNOSIS — Z809 Family history of malignant neoplasm, unspecified: Secondary | ICD-10-CM | POA: Diagnosis not present

## 2024-08-27 DIAGNOSIS — R110 Nausea: Secondary | ICD-10-CM | POA: Diagnosis not present

## 2024-08-27 DIAGNOSIS — Z5112 Encounter for antineoplastic immunotherapy: Secondary | ICD-10-CM | POA: Insufficient documentation

## 2024-08-27 DIAGNOSIS — Z808 Family history of malignant neoplasm of other organs or systems: Secondary | ICD-10-CM | POA: Diagnosis not present

## 2024-08-27 DIAGNOSIS — M545 Low back pain, unspecified: Secondary | ICD-10-CM | POA: Insufficient documentation

## 2024-08-27 DIAGNOSIS — Z7982 Long term (current) use of aspirin: Secondary | ICD-10-CM | POA: Insufficient documentation

## 2024-08-27 DIAGNOSIS — Z87891 Personal history of nicotine dependence: Secondary | ICD-10-CM | POA: Diagnosis not present

## 2024-08-27 LAB — CMP (CANCER CENTER ONLY)
ALT: 12 U/L (ref 0–44)
AST: 16 U/L (ref 15–41)
Albumin: 4.2 g/dL (ref 3.5–5.0)
Alkaline Phosphatase: 58 U/L (ref 38–126)
Anion gap: 11 (ref 5–15)
BUN: 12 mg/dL (ref 8–23)
CO2: 22 mmol/L (ref 22–32)
Calcium: 9.1 mg/dL (ref 8.9–10.3)
Chloride: 103 mmol/L (ref 98–111)
Creatinine: 0.81 mg/dL (ref 0.61–1.24)
GFR, Estimated: >60 mL/min
Glucose, Bld: 95 mg/dL (ref 70–99)
Potassium: 4.4 mmol/L (ref 3.5–5.1)
Sodium: 136 mmol/L (ref 135–145)
Total Bilirubin: 0.8 mg/dL (ref 0.0–1.2)
Total Protein: 6.3 g/dL — ABNORMAL LOW (ref 6.5–8.1)

## 2024-08-27 LAB — CBC WITH DIFFERENTIAL (CANCER CENTER ONLY)
Abs Immature Granulocytes: 0.02 K/uL (ref 0.00–0.07)
Basophils Absolute: 0 K/uL (ref 0.0–0.1)
Basophils Relative: 0 %
Eosinophils Absolute: 0.1 K/uL (ref 0.0–0.5)
Eosinophils Relative: 3 %
HCT: 41.8 % (ref 39.0–52.0)
Hemoglobin: 14.5 g/dL (ref 13.0–17.0)
Immature Granulocytes: 0 %
Lymphocytes Relative: 19 %
Lymphs Abs: 0.9 K/uL (ref 0.7–4.0)
MCH: 30.1 pg (ref 26.0–34.0)
MCHC: 34.7 g/dL (ref 30.0–36.0)
MCV: 86.9 fL (ref 80.0–100.0)
Monocytes Absolute: 0.5 K/uL (ref 0.1–1.0)
Monocytes Relative: 12 %
Neutro Abs: 3 K/uL (ref 1.7–7.7)
Neutrophils Relative %: 66 %
Platelet Count: 221 K/uL (ref 150–400)
RBC: 4.81 MIL/uL (ref 4.22–5.81)
RDW: 13.5 % (ref 11.5–15.5)
WBC Count: 4.5 K/uL (ref 4.0–10.5)
nRBC: 0 % (ref 0.0–0.2)

## 2024-08-27 MED ORDER — SODIUM CHLORIDE 0.9 % IV SOLN: Freq: Once | INTRAVENOUS | Status: AC

## 2024-08-27 MED ORDER — ACETAMINOPHEN 325 MG PO TABS
650.0000 mg | ORAL_TABLET | Freq: Once | ORAL | Status: AC
  Administered 2024-08-27: 650 mg via ORAL
  Filled 2024-08-27: qty 2

## 2024-08-27 MED ORDER — SODIUM CHLORIDE 0.9 % IV SOLN
150.0000 mg | Freq: Once | INTRAVENOUS | Status: AC
  Administered 2024-08-27: 150 mg via INTRAVENOUS
  Filled 2024-08-27: qty 150

## 2024-08-27 MED ORDER — DEXAMETHASONE SOD PHOSPHATE PF 10 MG/ML IJ SOLN
8.0000 mg | Freq: Once | INTRAMUSCULAR | Status: AC
  Administered 2024-08-27: 8 mg via INTRAVENOUS

## 2024-08-27 MED ORDER — PALONOSETRON HCL INJECTION 0.25 MG/5ML
0.2500 mg | Freq: Once | INTRAVENOUS | Status: AC
  Administered 2024-08-27: 0.25 mg via INTRAVENOUS
  Filled 2024-08-27: qty 5

## 2024-08-27 MED ORDER — DEXTROSE 5 % IV SOLN
70.0000 mg/m2 | Freq: Once | INTRAVENOUS | Status: AC
  Administered 2024-08-27: 140 mg via INTRAVENOUS
  Filled 2024-08-27: qty 60

## 2024-08-27 NOTE — Patient Instructions (Signed)

## 2024-08-30 ENCOUNTER — Other Ambulatory Visit: Payer: Self-pay

## 2024-08-30 DIAGNOSIS — C9000 Multiple myeloma not having achieved remission: Secondary | ICD-10-CM

## 2024-08-30 MED ORDER — ERGOCALCIFEROL 1.25 MG (50000 UT) PO CAPS: 50000.0000 [IU] | ORAL_CAPSULE | ORAL | 2 refills | Status: AC

## 2024-08-30 MED ORDER — APIXABAN 5 MG PO TABS: 5.0000 mg | ORAL_TABLET | Freq: Two times a day (BID) | ORAL | 0 refills | Status: DC

## 2024-09-01 LAB — MULTIPLE MYELOMA PANEL, SERUM
Albumin SerPl Elph-Mcnc: 3.8 g/dL (ref 2.9–4.4)
Albumin/Glob SerPl: 1.8 — ABNORMAL HIGH (ref 0.7–1.7)
Alpha 1: 0.2 g/dL (ref 0.0–0.4)
Alpha2 Glob SerPl Elph-Mcnc: 0.6 g/dL (ref 0.4–1.0)
B-Globulin SerPl Elph-Mcnc: 1 g/dL (ref 0.7–1.3)
Gamma Glob SerPl Elph-Mcnc: 0.4 g/dL (ref 0.4–1.8)
Globulin, Total: 2.2 g/dL (ref 2.2–3.9)
IgA: 22 mg/dL — ABNORMAL LOW (ref 61–437)
IgG (Immunoglobin G), Serum: 444 mg/dL — ABNORMAL LOW (ref 603–1613)
IgM (Immunoglobulin M), Srm: 28 mg/dL (ref 20–172)
Total Protein ELP: 6 g/dL (ref 6.0–8.5)

## 2024-09-03 ENCOUNTER — Other Ambulatory Visit: Payer: Self-pay

## 2024-09-03 DIAGNOSIS — C9000 Multiple myeloma not having achieved remission: Secondary | ICD-10-CM

## 2024-09-03 MED ORDER — FENTANYL 25 MCG/HR TD PT72: 1.0000 | MEDICATED_PATCH | TRANSDERMAL | 0 refills | Status: AC

## 2024-09-10 ENCOUNTER — Other Ambulatory Visit: Payer: Self-pay

## 2024-09-10 DIAGNOSIS — C9000 Multiple myeloma not having achieved remission: Secondary | ICD-10-CM

## 2024-09-10 MED ORDER — APIXABAN 5 MG PO TABS: 5.0000 mg | ORAL_TABLET | Freq: Two times a day (BID) | ORAL | 2 refills | Status: AC

## 2024-09-13 ENCOUNTER — Other Ambulatory Visit: Payer: Self-pay

## 2024-09-16 MED FILL — Fosaprepitant Dimeglumine For IV Infusion 150 MG (Base Eq): INTRAVENOUS | Qty: 5 | Status: AC

## 2024-09-17 ENCOUNTER — Inpatient Hospital Stay

## 2024-09-17 ENCOUNTER — Inpatient Hospital Stay: Admitting: Hematology

## 2024-09-17 VITALS — BP 129/81 | HR 67 | Temp 97.6°F | Resp 17 | Ht 63.0 in | Wt 184.0 lb

## 2024-09-17 DIAGNOSIS — Z7189 Other specified counseling: Secondary | ICD-10-CM

## 2024-09-17 DIAGNOSIS — C9000 Multiple myeloma not having achieved remission: Secondary | ICD-10-CM

## 2024-09-17 DIAGNOSIS — C61 Malignant neoplasm of prostate: Secondary | ICD-10-CM

## 2024-09-17 DIAGNOSIS — Z5112 Encounter for antineoplastic immunotherapy: Secondary | ICD-10-CM | POA: Diagnosis not present

## 2024-09-17 DIAGNOSIS — G893 Neoplasm related pain (acute) (chronic): Secondary | ICD-10-CM

## 2024-09-17 DIAGNOSIS — Z5111 Encounter for antineoplastic chemotherapy: Secondary | ICD-10-CM

## 2024-09-17 LAB — CMP (CANCER CENTER ONLY)
ALT: 5 U/L (ref 0–44)
AST: 13 U/L — ABNORMAL LOW (ref 15–41)
Albumin: 4.1 g/dL (ref 3.5–5.0)
Alkaline Phosphatase: 58 U/L (ref 38–126)
Anion gap: 12 (ref 5–15)
BUN: 12 mg/dL (ref 8–23)
CO2: 23 mmol/L (ref 22–32)
Calcium: 9.1 mg/dL (ref 8.9–10.3)
Chloride: 103 mmol/L (ref 98–111)
Creatinine: 0.81 mg/dL (ref 0.61–1.24)
GFR, Estimated: >60 mL/min
Glucose, Bld: 105 mg/dL — ABNORMAL HIGH (ref 70–99)
Potassium: 4 mmol/L (ref 3.5–5.1)
Sodium: 138 mmol/L (ref 135–145)
Total Bilirubin: 0.6 mg/dL (ref 0.0–1.2)
Total Protein: 6.2 g/dL — ABNORMAL LOW (ref 6.5–8.1)

## 2024-09-17 LAB — CBC WITH DIFFERENTIAL (CANCER CENTER ONLY)
Abs Immature Granulocytes: 0.02 10*3/uL (ref 0.00–0.07)
Basophils Absolute: 0 10*3/uL (ref 0.0–0.1)
Basophils Relative: 0 %
Eosinophils Absolute: 0.1 10*3/uL (ref 0.0–0.5)
Eosinophils Relative: 2 %
HCT: 41.2 % (ref 39.0–52.0)
Hemoglobin: 14.3 g/dL (ref 13.0–17.0)
Immature Granulocytes: 1 %
Lymphocytes Relative: 21 %
Lymphs Abs: 0.8 10*3/uL (ref 0.7–4.0)
MCH: 30.2 pg (ref 26.0–34.0)
MCHC: 34.7 g/dL (ref 30.0–36.0)
MCV: 86.9 fL (ref 80.0–100.0)
Monocytes Absolute: 0.4 10*3/uL (ref 0.1–1.0)
Monocytes Relative: 10 %
Neutro Abs: 2.5 10*3/uL (ref 1.7–7.7)
Neutrophils Relative %: 66 %
Platelet Count: 189 10*3/uL (ref 150–400)
RBC: 4.74 MIL/uL (ref 4.22–5.81)
RDW: 12.9 % (ref 11.5–15.5)
WBC Count: 3.7 10*3/uL — ABNORMAL LOW (ref 4.0–10.5)
nRBC: 0 % (ref 0.0–0.2)

## 2024-09-17 MED ORDER — ZOLEDRONIC ACID 4 MG/100ML IV SOLN
4.0000 mg | Freq: Once | INTRAVENOUS | Status: AC
  Administered 2024-09-17: 4 mg via INTRAVENOUS
  Filled 2024-09-17: qty 100

## 2024-09-17 MED ORDER — DEXAMETHASONE SODIUM PHOSPHATE 10 MG/ML IJ SOLN
8.0000 mg | Freq: Once | INTRAMUSCULAR | Status: AC
  Administered 2024-09-17: 8 mg via INTRAVENOUS

## 2024-09-17 MED ORDER — PALONOSETRON HCL INJECTION 0.25 MG/5ML
0.2500 mg | Freq: Once | INTRAVENOUS | Status: AC
  Administered 2024-09-17: 0.25 mg via INTRAVENOUS
  Filled 2024-09-17: qty 5

## 2024-09-17 MED ORDER — SODIUM CHLORIDE 0.9 % IV SOLN: Freq: Once | INTRAVENOUS | Status: AC

## 2024-09-17 MED ORDER — DEXTROSE 5 % IV SOLN
70.0000 mg/m2 | Freq: Once | INTRAVENOUS | Status: AC
  Administered 2024-09-17: 140 mg via INTRAVENOUS
  Filled 2024-09-17: qty 60

## 2024-09-17 MED ORDER — SODIUM CHLORIDE 0.9 % IV SOLN
150.0000 mg | Freq: Once | INTRAVENOUS | Status: AC
  Administered 2024-09-17: 150 mg via INTRAVENOUS
  Filled 2024-09-17: qty 150

## 2024-09-17 NOTE — Patient Instructions (Signed)
 [facility] - A DEPT OF [facility]  Discharge Instructions: Thank you for choosing [facility] to provide your oncology and hematology care.   If you have a lab appointment with the Cancer Center, please go directly to the Cancer Center and check in at the registration area.   Wear comfortable clothing and clothing appropriate for easy access to any Portacath or PICC line.   We strive to give you quality time with your provider. You may need to reschedule your appointment if you arrive late (15 or more minutes).  Arriving late affects you and other patients whose appointments are after yours.  Also, if you miss three or more appointments without notifying the office, you may be dismissed from the clinic at the provider's discretion.      For prescription refill requests, have your pharmacy contact our office and allow 72 hours for refills to be completed.    Today you received the following chemotherapy and/or immunotherapy agent: Kyprolis    To help prevent nausea and vomiting after your treatment, we encourage you to take your nausea medication as directed.  BELOW ARE SYMPTOMS THAT SHOULD BE REPORTED IMMEDIATELY: *FEVER GREATER THAN 100.4 F (38 C) OR HIGHER *CHILLS OR SWEATING *NAUSEA AND VOMITING THAT IS NOT CONTROLLED WITH YOUR NAUSEA MEDICATION *UNUSUAL SHORTNESS OF BREATH *UNUSUAL BRUISING OR BLEEDING *URINARY PROBLEMS (pain or burning when urinating, or frequent urination) *BOWEL PROBLEMS (unusual diarrhea, constipation, pain near the anus) TENDERNESS IN MOUTH AND THROAT WITH OR WITHOUT PRESENCE OF ULCERS (sore throat, sores in mouth, or a toothache) UNUSUAL RASH, SWELLING OR PAIN  UNUSUAL VAGINAL DISCHARGE OR ITCHING   Items with * indicate a potential emergency and should be followed up as soon as possible or go to the Emergency Department if any problems should occur.  Please show the CHEMOTHERAPY ALERT CARD or IMMUNOTHERAPY ALERT  CARD at check-in to the Emergency Department and triage nurse.  Should you have questions after your visit or need to cancel or reschedule your appointment, please contact [facility] - A DEPT [facility]  Dept: [phone]  and follow the prompts.  Office hours are 8:00 a.m. to 4:30 p.m. Monday - Friday. Please note that voicemails left after 4:00 p.m. may not be returned until the following business day.  We are closed weekends and major holidays. You have access to a nurse at all times for urgent questions. Please call the main number to the clinic Dept: [phone] and follow the prompts.   For any non-urgent questions, you may also contact your provider using MyChart. We now offer e-Visits for anyone 7 and older to request care online for non-urgent symptoms. For details visit [email or web address].   Also download the MyChart app! Go to the app store, search MyChart, open the app, select [place], and log in with your MyChart username and password.

## 2024-09-17 NOTE — Progress Notes (Signed)
 " HEMATOLOGY ONCOLOGY PROGRESS NOTE  Date of service: 09/17/2024  Patient Care Team: [facility], [facility] as PCP - General  CHIEF COMPLAINT/PURPOSE OF CONSULTATION: Follow-up for continued evaluation and management of multiple myeloma. Concern for prostate cancer  HISTORY OF PRESENTING ILLNESS: Anthony [name] is a wonderful 64 y.o. male who has been referred to us  by Dr Redmon, Noelle, PA for evaluation and management of newly diagnosed multiple myeloma. He reports He is doing well.   He reports persistent lower back pain that he has had since he was in his 20's. He notes no previous significant injuries. He further notes that he gets chiropractic adjustments and takes Tylenol  to manage his symptoms. He notes less back pain when standing up on his right leg and maintaining weight on his right leg.   He reports previous history of smoking and chewing tobacco over 40 years ago.   He had a recent fall back in February of this year with only a pulled muscle. And he notes another fall when chasing his cat where he fell on his right shoulder. He reports pain in right shoulder.    He had a recent COVID-19 infection back in March.   He reports intermittent cough with SOB. He notes he takes 2 Rolaids at night. We discussed potentially trying antacids which he is agreeable to as he will begin steroids soon and was advised of the possible symptoms and side effects from taking steroids.   We discussed CRAB criteria and that he meets at least two of the criterion being  anemia and bone disease.   We discussed getting additional scans for further evaluation which he is agreeable to.   We further discussed starting Daratumumab /Velcade /Dexamethasone /Zometa  and Revlimid  for treatment which he is agreeable to. We also discussed starting steroids before treatment and taking an acid suppressant which he was also agreeable to.   We discussed getting Senna and taking it as needed for constipation.    Labs done today were reviewed in detail.   We discussed his recent bone marrow biopsy and aspiration done 02/06/2022.   We discussed PET/CT scan done 01/28/2022.   We discussed CT right shoulder w/o contrast done 11/13/2021.   SUMMARY OF ONCOLOGIC HISTORY: Oncology History  Multiple myeloma (HCC)  07/17/2017 Initial Diagnosis   Multiple myeloma (HCC)   02/22/2022 - 03/29/2022 Chemotherapy   Patient is on Treatment Plan : MYELOMA NEWLY DIAGNOSED TRANSPLANT CANDIDATE DaraVRd (Daratumumab  IV) q21d x 6 Cycles (Induction/Consolidation)     04/08/2022 -  Chemotherapy   Patient is on Treatment Plan : MYELOMA RELAPSED/REFRACTORY Carfilzomib  D1,8,15 + Daratumumab  + Dexamethasone  q28d     Multiple myeloma not having achieved remission (HCC)  02/21/2022 Initial Diagnosis   Multiple myeloma not having achieved remission (HCC)   02/22/2022 - 03/29/2022 Chemotherapy   Patient is on Treatment Plan : MYELOMA NEWLY DIAGNOSED TRANSPLANT CANDIDATE DaraVRd (Daratumumab  IV) q21d x 6 Cycles (Induction/Consolidation)     04/08/2022 -  Chemotherapy   Patient is on Treatment Plan : MYELOMA RELAPSED/REFRACTORY Carfilzomib  D1,8,15 + Daratumumab  + Dexamethasone  q28d     07/30/2022 Cancer Staging   Staging form: Plasma Cell Myeloma and Plasma Cell Disorders, AJCC 8th Edition - Clinical stage from 07/30/2022: High-risk cytogenetics: Present, LDH: Unknown - Signed by Onesimo Emaline Brink, MD on 07/02/2023 Histopathologic type: Multiple myeloma Stage prefix: Initial diagnosis Cytogenetics: 17p deletion Bone disease on imaging: Present     INTERVAL HISTORY:  Anthony [name] is a 64 y.o. male who is here today for  continued evaluation and management of multiple myeloma. He is accompanied by his wife today.  he was last seen by me on 06/18/2024; at the time he mentioned experiencing elevated PSA levels .  Today, he reports shoulder pain in the last week. He was shoveling the snow this past week and went dove  hunting this past week. He reports this may be muscular pain.   He had his prostate bx this past Thursday. He reports some difficulty urinating, but attributes this to the recovery from his bx.  He denies any new bone pains.   REVIEW OF SYSTEMS:   10 Point review of systems of done and is negative except as noted above.  MEDICAL HISTORY Past Medical History:  Diagnosis Date   Back pain    Cancer (HCC)    Multiple myeloma (HCC) 02/15/2022    SURGICAL HISTORY Past Surgical History:  Procedure Laterality Date   IR BONE TUMOR(S)RF ABLATION  05/07/2022   IR BONE TUMOR(S)RF ABLATION  05/07/2022   IR BONE TUMOR(S)RF ABLATION  05/07/2022   IR KYPHO EA ADDL LEVEL THORACIC OR LUMBAR  05/07/2022   IR KYPHO LUMBAR INC FX REDUCE BONE BX UNI/BIL CANNULATION INC/IMAGING  05/07/2022   IR KYPHO THORACIC WITH BONE BIOPSY  05/07/2022   IR RADIOLOGIST EVAL & MGMT  04/23/2022   IR RADIOLOGIST EVAL & MGMT  05/17/2022   IR RADIOLOGIST EVAL & MGMT  06/06/2022    SOCIAL HISTORY Social History[1]  Social History   Social History Narrative   Not on file    SOCIAL DRIVERS OF HEALTH SDOH Screenings   Food Insecurity: No Food Insecurity (05/07/2022)  Housing: Low Risk (05/07/2022)  Transportation Needs: No Transportation Needs (05/07/2022)  Utilities: Not At Risk (05/07/2022)  Depression (PHQ2-9): Low Risk (08/27/2024)  Financial Resource Strain: Low Risk (05/07/2022)  Tobacco Use: Medium Risk (05/04/2024)   Received from [facility]     FAMILY HISTORY Family History  Problem Relation Age of Onset   Rheum arthritis Mother    Other Sister        Pre-cancerous uterine mass;    Rheum arthritis Sister    Bone cancer Paternal Grandfather    Brain cancer Maternal Uncle    Brain cancer Maternal Aunt    Osteoporosis Neg Hx      ALLERGIES: has no known allergies.  MEDICATIONS  Current Outpatient Medications  Medication Sig Dispense Refill   acyclovir  (ZOVIRAX ) 400 MG tablet Take 1  tablet (400 mg total) by mouth 2 (two) times daily. 10 tablet 0   aluminum chloride (DRYSOL) 20 % external solution Apply 1 application topically at bedtime.     apixaban  (ELIQUIS ) 5 MG TABS tablet Take 1 tablet (5 mg total) by mouth 2 (two) times daily. 60 tablet 2   Calcium Citrate-Vitamin D  250-2.5 MG-MCG TABS Take 1 tablet by mouth every morning.     calcium-vitamin D  (OSCAL WITH D) 250-125 MG-UNIT tablet Take 1 tablet by mouth daily.     cyanocobalamin  (VITAMIN B12) 1000 MCG tablet Take 1 tablet (1,000 mcg total) by mouth daily. 30 tablet 3   cyclobenzaprine (FLEXERIL) 10 MG tablet Take 10 mg by mouth 3 (three) times daily as needed for muscle spasms. (Patient not taking: Reported on 04/23/2024)     diclofenac sodium (VOLTAREN) 1 % GEL Apply 2 g topically as needed.     dronabinol  (MARINOL ) 10 MG capsule Take 1 capsule (10 mg total) by mouth 2 (two) times daily before a meal. (Patient not taking:  Reported on 04/23/2024) 60 capsule 0   ergocalciferol  (VITAMIN D2) 1.25 MG (50000 UT) capsule Take 1 capsule (50,000 Units total) by mouth 2 (two) times a week. 12 capsule 2   escitalopram (LEXAPRO) 10 MG tablet Take 10 mg by mouth daily. (Patient not taking: Reported on 04/23/2024)     esomeprazole  (NEXIUM ) 40 MG capsule Take 1 capsule (40 mg total) by mouth daily. Take 1 capsule (40 mg total) daily before breakfast 30 capsule 3   famotidine  (PEPCID ) 40 MG tablet Take 40 mg by mouth daily.     fentaNYL  (DURAGESIC ) 25 MCG/HR Place 1 patch onto the skin every 3 (three) days. 10 patch 0   lidocaine  (LIDODERM ) 5 % Place 1 patch onto the skin daily. Remove & Discard patch within 12 hours or as directed by MD 30 patch 0   loratadine (CLARITIN) 10 MG tablet Take 10 mg by mouth daily.     LORazepam  (ATIVAN ) 0.5 MG tablet Take 1 tablet (0.5 mg total) by mouth every 6 (six) hours as needed (Nausea or vomiting). 30 tablet 0   losartan (COZAAR) 50 MG tablet Take 25 mg by mouth.     methocarbamol  (ROBAXIN ) 500 MG  tablet Take 1 tablet (500 mg total) by mouth every 8 (eight) hours as needed for muscle spasms. 30 tablet 0   metroNIDAZOLE  (METROGEL ) 1 % gel Apply topically daily. 45 g 0   Multiple Vitamin (MULTIVITAMIN) tablet Take 1 tablet by mouth daily.     Omega-3 Fatty Acids (FISH OIL) 1360 MG CAPS Take 1 capsule by mouth daily.     ondansetron  (ZOFRAN ) 8 MG tablet Take 1 tablet (8 mg total) by mouth every 8 (eight) hours as needed for nausea or vomiting. 30 tablet 0   oxyCODONE  (ROXICODONE ) 5 MG immediate release tablet Take 1 tablet (5 mg total) by mouth every 4 (four) hours as needed for severe pain (pain score 7-10) or moderate pain (pain score 4-6). Take 1 to 2 tablets every 4 hours as needed for moderate to severe pain. 60 tablet 0   pantoprazole  (PROTONIX ) 40 MG tablet Take 40 mg by mouth daily. (Patient not taking: Reported on 08/11/2024)     prochlorperazine  (COMPAZINE ) 10 MG tablet TAKE ONE TABLET BY MOUTH EVERY 6 HOURS AS NEEDED FOR NAUSEA AND VOMITING 30 tablet 1   REVLIMID  15 MG capsule Take 1 capsule (15 mg total) by mouth daily for 21 days and then take 7 days off. Repeat cycle. 21 capsule 0   senna-docusate (SENNA S) 8.6-50 MG tablet Take 2 tablets by mouth at bedtime. (Patient not taking: Reported on 04/23/2024) 60 tablet 1   terbinafine (LAMISIL) 1 % cream Apply 1 application topically 2 (two) times daily.     No current facility-administered medications for this visit.    PHYSICAL EXAMINATION: ECOG PERFORMANCE STATUS: 1 - Symptomatic but completely ambulatory VITALS: Vitals:   09/17/24 0950  BP: 129/81  Pulse: 67  Resp: 17  Temp: 97.6 F (36.4 C)  SpO2: 100%   Filed Weights   09/17/24 0950  Weight: 184 lb (83.5 kg)   Body mass index is 32.59 kg/m.  GENERAL: alert, in no acute distress and comfortable SKIN: no acute rashes, no significant lesions EYES: conjunctiva are pink and non-injected, sclera anicteric OROPHARYNX: MMM, no exudates, no oropharyngeal erythema or  ulceration NECK: supple, no JVD LYMPH:  no palpable lymphadenopathy in the cervical, axillary or inguinal regions LUNGS: clear to auscultation b/l with normal respiratory effort HEART: regular rate &  rhythm ABDOMEN:  normoactive bowel sounds , non tender, not distended, no hepatosplenomegaly Extremity: no pedal edema PSYCH: alert & oriented x 3 with fluent speech NEURO: no focal motor/sensory deficits  LABORATORY DATA:   I have reviewed the data as listed     Latest Ref Rng & Units 09/17/2024    9:36 AM 08/27/2024    1:06 PM 08/11/2024   10:16 AM  CBC EXTENDED  WBC 4.0 - 10.5 K/uL 3.7  4.5  3.6   RBC 4.22 - 5.81 MIL/uL 4.74  4.81  5.00   Hemoglobin 13.0 - 17.0 g/dL 85.6  85.4  85.4   HCT 39.0 - 52.0 % 41.2  41.8  42.5   Platelets 150 - 400 K/uL 189  221  182   NEUT# 1.7 - 7.7 K/uL 2.5  3.0  2.2   Lymph# 0.7 - 4.0 K/uL 0.8  0.9  0.8        Latest Ref Rng & Units 09/17/2024    9:36 AM 08/27/2024    1:06 PM 08/11/2024   10:16 AM  CMP  Glucose 70 - 99 mg/dL 894  95  890   BUN 8 - 23 mg/dL 12  12  16    Creatinine 0.61 - 1.24 mg/dL 9.18  9.18  9.01   Sodium 135 - 145 mmol/L 138  136  136   Potassium 3.5 - 5.1 mmol/L 4.0  4.4  4.1   Chloride 98 - 111 mmol/L 103  103  104   CO2 22 - 32 mmol/L 23  22  23    Calcium 8.9 - 10.3 mg/dL 9.1  9.1  8.9   Total Protein 6.5 - 8.1 g/dL 6.2  6.3  6.0   Total Bilirubin 0.0 - 1.2 mg/dL 0.6  0.8  0.8   Alkaline Phos 38 - 126 U/L 58  58  53   AST 15 - 41 U/L 13  16  17    ALT 0 - 44 U/L 5  12  12     MULTIPLE MYELOMA updated 08/27/2024     PATHOLOGY Surgical Pathology CASE: [id number] PATIENT: Anthony [name] Bone Marrow Report     Clinical History: Multiple Myeloma     DIAGNOSIS:  BONE MARROW, ASPIRATE, CLOT, CORE: -Hypercellular bone marrow (60%) with erythroid predominant trilineage hematopoiesis and no evidence of residual plasma cell neoplasm (less than 1% plasma cells by manual aspirate differential, and  CD138 immunohistochemical analysis of clot and core sections).  PERIPHERAL BLOOD: -Leukopenia  MICROSCOPIC DESCRIPTION:  PERIPHERAL BLOOD SMEAR: Platelets: Adequate in number, no platelet clumps identified Erythroid: Normocytic normochromic red blood cells Leukocytes: Mild leukopenia, negative for dysplastic granulocytes, blasts or  plasma cells  BONE MARROW ASPIRATE: Cellular Erythroid precursors: Relatively increased, show a full sequence of maturation with occasional abnormal forms (including binucleate forms, megaloblastoid change and nuclear membrane irregularities) Granulocytic precursors: Decreased, show a full sequence of generally orderly maturation Megakaryocytes: Normal in number and morphology Lymphocytes/plasma cells: Not increased    02/06/2022 Molecular pathology     RADIOGRAPHIC STUDIES: I have personally reviewed the radiological images as listed and agreed with the findings in the report. NM PET (PSMA) SKULL TO MID THIGH Result Date: 07/06/2024 EXAM: PROSTATE PET SKULL BASE TO MID THIGHS 07/06/2024 02:49:56 PM TECHNIQUE: RADIOPHARMACEUTICAL: 8.3 mCi F-18 piflufolastat (Pylarify) injected intravenously. PET imaging was obtained from skull vertex to mid thighs. Computed tomography was used for attenuation correction and localization. Fusion imaging was obtained. Uptake time  mins. COMPARISON: None available. CLINICAL HISTORY: Prostate cancer,  high risk, staging; Patient with elevated PSA and new bone lesions concerning for metastatic prostate cancer. Patient also with history of multiple myeloma which is currently in remission but has old bone lesions. Recent CT showed new bone lesions. FINDINGS: PROSTATE AND PROSTATE BED: A small focus of moderate change of activity in the posterior aspect of the left lobe of the prostate gland is nonspecific. The prostate gland is enlarged. No evidence of prostate cancer. LYMPH NODES: No PSMA avid pelvic lymph nodes. No PSMA avid  mediastinal lymph nodes. Calcified mediastinal and hiatal lymph nodes suggest prior granulomatous disease. No evidence of metastatic lymphadenopathy. BONES: There is extensive lytic change within the entirety of the spine, ribs, shoulder girdles and proximal extremities. These extensive lytic lesions do not have associated PSMA activity. Findings are suggestive of non-PSMA skeletal metastasis including multiple myeloma. OTHER PET FINDINGS: No focal stress site. No suspicious pulmonary nodules. Physiologic activity within the salivary glands, liver, spleen, kidneys, bowel, and urinary bladder. IMPRESSION: 1. No evidence of prostate cancer or metastatic lymphadenopathy or visceral disease. 2. Extensive lytic changes throughout the skeleton without PSMA activity, suspicious for non-PSMA-avid skeletal malignancy such as multiple myeloma. Electronically signed by: Norleen Boxer MD 07/06/2024 04:11 PM EST RP Workstation: [id number]    ASSESSMENT & PLAN:  64 y.o. male with  1. R-ISS stage III multiple myeloma with extensive bone metastases and pathologic fracture rt shoulder -Recent bone marrow biopsy and aspiration done 02/06/2022 revealed hypercellular bone marrow with plasma cell neoplasm. Cytogenetics-normal karyotype Molecular cytogenetics-TP53 deletion and duplication of 1 q.  2.  Concern for prostate cancer with elevated PSA status post recent prostate biopsy. PLAN: - Discussed lab results on 09/17/2024 in detail with patient: -CBC is stable, WBC 3.7  -CMP is stable and wnl -Myeloma panel shows no measurable M protein and IFE is negative  -PSMA PET scan from 07/03/2024 did not show new bone lesions suggestive of metastatic prostate cancer. -In the context of concern for prostate cancer his Revlimid  was held. -He is tolerating his single agent carfilzomib  maintenance treatment every 2 weeks without any acute issues. -Patient's previous CT abdomen suggested possible new bone lesions though PSMA  PET/CT scan was negative for metastatic prostate cancer findings.  We discussed that he could get a FDG PET scan to rule out other active bone lesions from myeloma though in the absence of any new symptoms and normal myeloma panel patient prefers to hold off at this time which is quite reasonable.  -He will continue his carfilzomib  maintenance with the same supportive medications. -Patient has no lab or clinical findings suggestive of myeloma progression at this time. -He will continue to follow-up with urology for his prostate biopsy to determine possible diagnosis of prostate cancer and whether he would need treatment for that. -discussed option for possible repeat bmbx for minimal residual disease assessment  - Neoplasm related pain control optimal will continue current pain medications.  FOLLOW-UP   The total time spent in the appointment was 32 minutes* .  All of the patient's questions were answered and the patient knows to call the clinic with any problems, questions, or concerns.  Emaline Saran MD MS AAHIVMS [facility] Hematology/Oncology Physician [facility]  *Total Encounter Time as defined by the Centers for Medicare and Medicaid Services includes, in addition to the face-to-face time of a patient visit (documented in the note above) non-face-to-face time: obtaining and reviewing outside history, ordering and reviewing medications, tests or procedures, care coordination (communications with other health  care professionals or caregivers) and documentation in the medical record.  I, Alan Blowers, acting as a neurosurgeon for Emaline Saran, MD.,have documented all relevant documentation on the behalf of Emaline Saran, MD,as directed by  Emaline Saran, MD while in the presence of Emaline Saran, MD.  I have reviewed the above documentation for accuracy and completeness, and I agree with the above.  Emaline Saran, MD     [1]  Social History Tobacco Use   Smoking status: Never    Smokeless tobacco: Former    Types: Chew, Snuff  Vaping Use   Vaping status: Never Used  Substance Use Topics   Alcohol use: Yes    Alcohol/week: 2.0 standard drinks of alcohol    Types: 2 Glasses of wine per week   Drug use: No   "

## 2024-09-23 ENCOUNTER — Encounter: Payer: Self-pay | Admitting: Hematology

## 2024-10-01 ENCOUNTER — Inpatient Hospital Stay

## 2024-10-01 ENCOUNTER — Inpatient Hospital Stay: Attending: Physician Assistant

## 2024-10-15 ENCOUNTER — Inpatient Hospital Stay: Admitting: Hematology

## 2024-10-15 ENCOUNTER — Inpatient Hospital Stay

## 2024-10-29 ENCOUNTER — Inpatient Hospital Stay

## 2024-10-29 ENCOUNTER — Inpatient Hospital Stay: Attending: Physician Assistant

## 2024-11-12 ENCOUNTER — Inpatient Hospital Stay: Admitting: Hematology

## 2024-11-12 ENCOUNTER — Inpatient Hospital Stay
